# Patient Record
Sex: Female | Born: 1945 | Race: Black or African American | Hispanic: No | Marital: Married | Smoking: Never smoker
Health system: Southern US, Community
[De-identification: ages and names within clinical notes are randomized; demographics above are authoritative.]

## PROBLEM LIST (undated history)

## (undated) DIAGNOSIS — I10 Essential (primary) hypertension: Secondary | ICD-10-CM

## (undated) DIAGNOSIS — E119 Type 2 diabetes mellitus without complications: Secondary | ICD-10-CM

## (undated) DIAGNOSIS — M810 Age-related osteoporosis without current pathological fracture: Secondary | ICD-10-CM

## (undated) DIAGNOSIS — N951 Menopausal and female climacteric states: Secondary | ICD-10-CM

## (undated) DIAGNOSIS — F32A Depression, unspecified: Secondary | ICD-10-CM

## (undated) DIAGNOSIS — F329 Major depressive disorder, single episode, unspecified: Secondary | ICD-10-CM

## (undated) DIAGNOSIS — R05 Cough: Secondary | ICD-10-CM

## (undated) DIAGNOSIS — F0280 Dementia in other diseases classified elsewhere without behavioral disturbance: Secondary | ICD-10-CM

## (undated) DIAGNOSIS — E785 Hyperlipidemia, unspecified: Secondary | ICD-10-CM

## (undated) HISTORY — DX: Menopausal and female climacteric states: N95.1

## (undated) HISTORY — DX: Cough: R05

## (undated) HISTORY — PX: UPPER GASTROINTESTINAL ENDOSCOPY: SHX188

## (undated) HISTORY — PX: ABDOMINAL HYSTERECTOMY: SHX81

## (undated) HISTORY — DX: Age-related osteoporosis without current pathological fracture: M81.0

## (undated) HISTORY — DX: Hyperlipidemia, unspecified: E78.5

## (undated) HISTORY — PX: TONSILLECTOMY: SUR1361

## (undated) HISTORY — DX: Essential (primary) hypertension: I10

## (undated) HISTORY — DX: Type 2 diabetes mellitus without complications: E11.9

## (undated) HISTORY — DX: Dementia in other diseases classified elsewhere, unspecified severity, without behavioral disturbance, psychotic disturbance, mood disturbance, and anxiety: F02.80

## (undated) NOTE — *Deleted (*Deleted)
Therapist, nutritional Palliative Care Consult Note Telephone: [phone]  Fax: [phone]  PATIENT NAME: Brandi Walsh DOB: 1946-02-19 MRN: [id number]  PRIMARY CARE PROVIDER:   [name], FNP  REFERRING PROVIDER:  [name], FNP [address] [place],  [place] [address]  RESPONSIBLE PARTYArena, Lindahl [phone]    Difonzo,[name]   [phone]     RECOMMENDATIONS and PLAN:  Palliative care encounter  Z51.5  1.  Advance care planning:  Goals of care per family's report remain unchanged and include quality of care at home and continue adult day care program.  Transition to Hospice care for comfort when additional decline occurs.  Advanced directives are as follows: Code status is DNR.  Palliative care will continue to followup with patient at her day care center in aprox 4-6 weeks for re-evaluation of cognitive/functional abilities and L hip wounds. .     2.  Vascular dementia with behavioral disturbances:  At baseline.  FAST stage is 7C.  Aromatherapy. No use of Seroquel unless significant agitation noted per family request.  Monitor for cognitive, functional changes and continued weight loss.     3. L hip decubitus:  Continue use of Bactroban on wound and cover with nonstick bandage daily. Reposition every 2 hours. Attempt to increase dietary protein.  Monitor for changes of status of wounds.  I spent 35 minutes providing this consultation,  From 0900  to 0935. More than 50% of the time in this consultation was spent coordinating communication with patient, caregiver and grand daughter.   HISTORY OF PRESENT ILLNESS:  Follow-up with Brandi Walsh.  Caregiver states that patient eats well when at adult daycare and naps oftenfalls within the past several weeks. She remains dependent on caregivers and family for completion of all ADLs.  No reports of additional wounds other than  the L lateral hip. She completed an alternate antibiotic Palliative Care was asked to help address goals of care.   CODE STATUS: DNR  PPS: 40%  weak HOSPICE ELIGIBILITY/DIAGNOSIS: TBD  PAST MEDICAL HISTORY:  Past Medical History:  Diagnosis Date  . Alzheimer's disease (HCC)   . COUGH DUE TO ACE INHIBITORS 11/25/2009   Qualifier: Diagnosis of  By: Everardo All MD, Cleophas Dunker   . Depression   . DIABETES MELLITUS, TYPE II 10/13/2007   Qualifier: Diagnosis of  By: Charlsie Quest RMA, Lucy    . HYPERLIPIDEMIA 10/13/2007   Qualifier: Diagnosis of  By: Tyrone Apple, Lucy    . HYPERTENSION 10/13/2007   Qualifier: Diagnosis of  By: Tyrone Apple, Lucy    . MENOPAUSAL SYNDROME 10/13/2007   Qualifier: Diagnosis of  By: Charlsie Quest RMA, Lucy    . OSTEOPOROSIS 10/13/2007   Qualifier: Diagnosis of  By: Tyrone Apple, Lucy      SOCIAL HX: Lives with husband Social History    PERTINENT MEDICATIONS:  Outpatient Encounter Medications as of 08/07/2020  Medication Sig  . Cholecalciferol (VITAMIN D) 50 MCG (2000 UT) tablet Take 2,000 Units by mouth daily.  Marland Kitchen donepezil (ARICEPT) 10 MG tablet Take 1 tablet daily  . escitalopram (LEXAPRO) 10 MG tablet Take 1.5 tablets (15 mg total) by mouth daily.  Marland Kitchen losartan (COZAAR) 50 MG tablet Take 1 tablet (50 mg total) by mouth daily.  . mupirocin oiment (BACTROBAN) 2 % Apply topically 3 (three) times daily.  Marland Kitchen omeprazole (PRILOSEC) 20 MG capsule Take 1 capsule (20 mg total) by mouth daily.  . [DISCONTINUED]  cephALEXin (KEFLEX) 500 MG capsule Take 1 capsule (500 mg total) by mouth 3 (three) times daily.  . [DISCONTINUED] rosuvastatin (CRESTOR) 40 MG tablet Take 1 tablet (40 mg total) by mouth daily.       PHYSICAL EXAM:   General: NAD, very frail appearing, thin elderly female in recliner Cardiovascular: regular rate and rhythm Pulmonary: unlabored respirations Abdomen: soft, nontender, + bowel sounds Extremities: no edema, decreased muscle mass. Very stiff knees Skin: Recurrent 1x1cm  stage 2 decubitus at the L hip over prominent trochanter region and adjacent superficial abrasion.. Scant serous sanguinous discharge without  erythema or tenderness. No increased warmth.  Bactroban and nonstick bandage reapplied.   Neurological: Alert and unable to determine orientation due to cognitive deficit. Speaks in 3-4 word sentences. Follows instructions  Weakness  Margaretha Sheffield, NP-C

---

## 1998-10-01 ENCOUNTER — Other Ambulatory Visit: Admission: RE | Admit: 1998-10-01 | Discharge: 1998-10-01 | Payer: Self-pay | Admitting: Gynecology

## 1999-09-29 ENCOUNTER — Encounter: Admission: RE | Admit: 1999-09-29 | Discharge: 1999-09-29 | Payer: Self-pay | Admitting: Gynecology

## 1999-09-29 ENCOUNTER — Encounter: Payer: Self-pay | Admitting: Gynecology

## 1999-11-18 ENCOUNTER — Other Ambulatory Visit: Admission: RE | Admit: 1999-11-18 | Discharge: 1999-11-18 | Payer: Self-pay | Admitting: Gynecology

## 2000-01-13 ENCOUNTER — Encounter: Admission: RE | Admit: 2000-01-13 | Discharge: 2000-04-12 | Payer: Self-pay | Admitting: Endocrinology

## 2000-02-13 ENCOUNTER — Ambulatory Visit (HOSPITAL_COMMUNITY): Admission: RE | Admit: 2000-02-13 | Discharge: 2000-02-13 | Payer: Self-pay | Admitting: Endocrinology

## 2000-02-13 ENCOUNTER — Encounter: Payer: Self-pay | Admitting: Endocrinology

## 2000-09-29 ENCOUNTER — Encounter: Payer: Self-pay | Admitting: Gynecology

## 2000-09-29 ENCOUNTER — Encounter: Admission: RE | Admit: 2000-09-29 | Discharge: 2000-09-29 | Payer: Self-pay | Admitting: Gynecology

## 2000-11-18 ENCOUNTER — Other Ambulatory Visit: Admission: RE | Admit: 2000-11-18 | Discharge: 2000-11-18 | Payer: Self-pay | Admitting: Gynecology

## 2001-10-03 ENCOUNTER — Encounter: Payer: Self-pay | Admitting: Gynecology

## 2001-10-03 ENCOUNTER — Encounter: Admission: RE | Admit: 2001-10-03 | Discharge: 2001-10-03 | Payer: Self-pay | Admitting: Gynecology

## 2001-12-26 ENCOUNTER — Other Ambulatory Visit: Admission: RE | Admit: 2001-12-26 | Discharge: 2001-12-26 | Payer: Self-pay | Admitting: Gynecology

## 2002-08-01 ENCOUNTER — Encounter: Admission: RE | Admit: 2002-08-01 | Discharge: 2002-08-01 | Payer: Self-pay | Admitting: Gynecology

## 2002-08-01 ENCOUNTER — Encounter: Payer: Self-pay | Admitting: Gynecology

## 2002-08-30 HISTORY — PX: BREAST BIOPSY: SHX20

## 2002-09-18 ENCOUNTER — Encounter (INDEPENDENT_AMBULATORY_CARE_PROVIDER_SITE_OTHER): Payer: Self-pay | Admitting: *Deleted

## 2002-09-18 ENCOUNTER — Ambulatory Visit (HOSPITAL_BASED_OUTPATIENT_CLINIC_OR_DEPARTMENT_OTHER): Admission: RE | Admit: 2002-09-18 | Discharge: 2002-09-18 | Payer: Self-pay | Admitting: General Surgery

## 2003-04-06 ENCOUNTER — Other Ambulatory Visit: Admission: RE | Admit: 2003-04-06 | Discharge: 2003-04-06 | Payer: Self-pay | Admitting: Gynecology

## 2003-09-25 ENCOUNTER — Encounter: Admission: RE | Admit: 2003-09-25 | Discharge: 2003-09-25 | Payer: Self-pay | Admitting: Gynecology

## 2004-04-15 ENCOUNTER — Other Ambulatory Visit: Admission: RE | Admit: 2004-04-15 | Discharge: 2004-04-15 | Payer: Self-pay | Admitting: Gynecology

## 2004-10-15 ENCOUNTER — Ambulatory Visit: Payer: Self-pay | Admitting: Endocrinology

## 2004-10-20 ENCOUNTER — Ambulatory Visit: Payer: Self-pay | Admitting: Endocrinology

## 2005-03-25 ENCOUNTER — Encounter: Admission: RE | Admit: 2005-03-25 | Discharge: 2005-03-25 | Payer: Self-pay | Admitting: Gynecology

## 2005-04-08 ENCOUNTER — Ambulatory Visit: Payer: Self-pay | Admitting: Endocrinology

## 2005-04-15 ENCOUNTER — Ambulatory Visit: Payer: Self-pay | Admitting: Endocrinology

## 2005-05-19 ENCOUNTER — Other Ambulatory Visit: Admission: RE | Admit: 2005-05-19 | Discharge: 2005-05-19 | Payer: Self-pay | Admitting: Gynecology

## 2006-01-22 ENCOUNTER — Ambulatory Visit: Payer: Self-pay | Admitting: Endocrinology

## 2006-01-28 ENCOUNTER — Ambulatory Visit: Payer: Self-pay | Admitting: Endocrinology

## 2006-02-23 ENCOUNTER — Ambulatory Visit: Payer: Self-pay | Admitting: Internal Medicine

## 2006-03-10 ENCOUNTER — Ambulatory Visit: Payer: Self-pay | Admitting: Internal Medicine

## 2006-03-10 LAB — HM COLONOSCOPY

## 2006-05-11 ENCOUNTER — Encounter: Admission: RE | Admit: 2006-05-11 | Discharge: 2006-05-11 | Payer: Self-pay | Admitting: Gynecology

## 2006-05-24 ENCOUNTER — Encounter: Admission: RE | Admit: 2006-05-24 | Discharge: 2006-05-24 | Payer: Self-pay | Admitting: Gynecology

## 2006-08-03 ENCOUNTER — Other Ambulatory Visit: Admission: RE | Admit: 2006-08-03 | Discharge: 2006-08-03 | Payer: Self-pay | Admitting: Gynecology

## 2006-09-09 ENCOUNTER — Ambulatory Visit: Payer: Self-pay | Admitting: Endocrinology

## 2007-07-07 ENCOUNTER — Encounter: Admission: RE | Admit: 2007-07-07 | Discharge: 2007-07-07 | Payer: Self-pay | Admitting: Gynecology

## 2007-07-21 ENCOUNTER — Ambulatory Visit: Payer: Self-pay | Admitting: Endocrinology

## 2007-07-21 LAB — CONVERTED CEMR LAB
ALT: 15 units/L (ref 0–35)
AST: 22 units/L (ref 0–37)
Alkaline Phosphatase: 87 units/L (ref 39–117)
BUN: 11 mg/dL (ref 6–23)
Calcium: 9.7 mg/dL (ref 8.4–10.5)
Chloride: 104 meq/L (ref 96–112)
Cholesterol: 252 mg/dL (ref 0–200)
Creatinine, Ser: 0.7 mg/dL (ref 0.4–1.2)
Direct LDL: 207.4 mg/dL
Eosinophils Absolute: 0.2 10*3/uL (ref 0.0–0.6)
Eosinophils Relative: 3.9 % (ref 0.0–5.0)
Glucose, Bld: 100 mg/dL — ABNORMAL HIGH (ref 70–99)
HCT: 39.7 % (ref 36.0–46.0)
HDL: 26.3 mg/dL — ABNORMAL LOW (ref >39.0)
Hgb A1c MFr Bld: 5 % (ref 4.6–6.0)
Leukocytes, UA: NEGATIVE
MCHC: 34.8 g/dL (ref 30.0–36.0)
Monocytes Absolute: 0.4 10*3/uL (ref 0.2–0.7)
Neutro Abs: 3.7 10*3/uL (ref 1.4–7.7)
Neutrophils Relative %: 59.8 % (ref 43.0–77.0)
Platelets: 250 10*3/uL (ref 150–400)
RBC: 4.34 M/uL (ref 3.87–5.11)
RDW: 12.4 % (ref 11.5–14.6)
Sodium: 136 meq/L (ref 135–145)
Total Protein: 7.5 g/dL (ref 6.0–8.3)
Urobilinogen, UA: 0.2 (ref 0.0–1.0)

## 2007-07-27 ENCOUNTER — Ambulatory Visit: Payer: Self-pay | Admitting: Endocrinology

## 2007-10-13 ENCOUNTER — Encounter: Payer: Self-pay | Admitting: Endocrinology

## 2007-10-13 DIAGNOSIS — E119 Type 2 diabetes mellitus without complications: Secondary | ICD-10-CM | POA: Insufficient documentation

## 2007-10-13 DIAGNOSIS — M810 Age-related osteoporosis without current pathological fracture: Secondary | ICD-10-CM

## 2007-10-13 DIAGNOSIS — I10 Essential (primary) hypertension: Secondary | ICD-10-CM

## 2007-10-13 DIAGNOSIS — E785 Hyperlipidemia, unspecified: Secondary | ICD-10-CM

## 2007-10-13 DIAGNOSIS — N951 Menopausal and female climacteric states: Secondary | ICD-10-CM | POA: Insufficient documentation

## 2007-10-13 HISTORY — DX: Age-related osteoporosis without current pathological fracture: M81.0

## 2007-10-13 HISTORY — DX: Essential (primary) hypertension: I10

## 2007-10-13 HISTORY — DX: Menopausal and female climacteric states: N95.1

## 2007-10-13 HISTORY — DX: Hyperlipidemia, unspecified: E78.5

## 2007-10-13 HISTORY — DX: Type 2 diabetes mellitus without complications: E11.9

## 2007-11-07 ENCOUNTER — Other Ambulatory Visit: Admission: RE | Admit: 2007-11-07 | Discharge: 2007-11-07 | Payer: Self-pay | Admitting: Gynecology

## 2008-07-10 ENCOUNTER — Encounter: Admission: RE | Admit: 2008-07-10 | Discharge: 2008-07-10 | Payer: Self-pay | Admitting: Gynecology

## 2008-07-24 ENCOUNTER — Ambulatory Visit: Payer: Self-pay | Admitting: Endocrinology

## 2008-07-25 LAB — CONVERTED CEMR LAB
ALT: 27 units/L (ref 0–35)
AST: 25 units/L (ref 0–37)
Albumin: 3.9 g/dL (ref 3.5–5.2)
Alkaline Phosphatase: 66 units/L (ref 39–117)
Basophils Relative: 0.4 % (ref 0.0–3.0)
Bilirubin Urine: NEGATIVE
CO2: 29 meq/L (ref 19–32)
Calcium: 9.9 mg/dL (ref 8.4–10.5)
Chloride: 113 meq/L — ABNORMAL HIGH (ref 96–112)
Creatinine, Ser: 0.7 mg/dL (ref 0.4–1.2)
Eosinophils Absolute: 0.3 10*3/uL (ref 0.0–0.7)
Eosinophils Relative: 3.3 % (ref 0.0–5.0)
Glucose, Bld: 102 mg/dL — ABNORMAL HIGH (ref 70–99)
HCT: 42.3 % (ref 36.0–46.0)
HDL: 28.7 mg/dL — ABNORMAL LOW (ref >39.0)
Ketones, ur: NEGATIVE mg/dL
Lymphocytes Relative: 28.4 % (ref 12.0–46.0)
Monocytes Absolute: 0.7 10*3/uL (ref 0.1–1.0)
Neutro Abs: 4.6 10*3/uL (ref 1.4–7.7)
TSH: 1.06 microintl units/mL (ref 0.35–5.50)
Total Bilirubin: 1.3 mg/dL — ABNORMAL HIGH (ref 0.3–1.2)
Total Protein, Urine: NEGATIVE mg/dL
Triglycerides: 123 mg/dL (ref 0–149)
Urobilinogen, UA: 0.2 (ref 0.0–1.0)
VLDL: 25 mg/dL (ref 0–40)
pH: 6 (ref 5.0–8.0)

## 2008-07-31 ENCOUNTER — Ambulatory Visit: Payer: Self-pay | Admitting: Endocrinology

## 2008-09-13 ENCOUNTER — Encounter: Payer: Self-pay | Admitting: Endocrinology

## 2008-09-13 ENCOUNTER — Ambulatory Visit: Payer: Self-pay | Admitting: Internal Medicine

## 2009-07-22 ENCOUNTER — Encounter: Admission: RE | Admit: 2009-07-22 | Discharge: 2009-07-22 | Payer: Self-pay | Admitting: Gynecology

## 2009-11-08 ENCOUNTER — Ambulatory Visit: Payer: Self-pay | Admitting: Endocrinology

## 2009-11-08 LAB — CONVERTED CEMR LAB
ALT: 26 units/L (ref 0–35)
AST: 23 units/L (ref 0–37)
Basophils Relative: 0.5 % (ref 0.0–3.0)
Bilirubin, Direct: 0.2 mg/dL (ref 0.0–0.3)
Cholesterol: 106 mg/dL (ref 0–200)
Creatinine,U: 350.3 mg/dL
Eosinophils Absolute: 0.3 10*3/uL (ref 0.0–0.7)
Glucose, Bld: 117 mg/dL — ABNORMAL HIGH (ref 70–99)
HCT: 43.4 % (ref 36.0–46.0)
HDL: 31.5 mg/dL — ABNORMAL LOW (ref >39.00)
Hemoglobin: 15 g/dL (ref 12.0–15.0)
Lymphocytes Relative: 27.7 % (ref 12.0–46.0)
Lymphs Abs: 2.1 10*3/uL (ref 0.7–4.0)
Microalb, Ur: 3.6 mg/dL — ABNORMAL HIGH (ref 0.0–1.9)
Monocytes Relative: 6.9 % (ref 3.0–12.0)
Potassium: 3.9 meq/L (ref 3.5–5.1)
RBC: 4.64 M/uL (ref 3.87–5.11)
RDW: 11.9 % (ref 11.5–14.6)
Sodium: 140 meq/L (ref 135–145)
VLDL: 18.8 mg/dL (ref 0.0–40.0)

## 2009-11-25 ENCOUNTER — Ambulatory Visit: Payer: Self-pay | Admitting: Endocrinology

## 2009-11-25 DIAGNOSIS — R059 Cough, unspecified: Secondary | ICD-10-CM | POA: Insufficient documentation

## 2009-11-25 DIAGNOSIS — R05 Cough: Secondary | ICD-10-CM

## 2009-11-25 HISTORY — DX: Cough, unspecified: R05.9

## 2010-05-02 ENCOUNTER — Telehealth: Payer: Self-pay | Admitting: Internal Medicine

## 2010-06-27 ENCOUNTER — Telehealth: Payer: Self-pay | Admitting: Endocrinology

## 2010-07-24 ENCOUNTER — Encounter: Admission: RE | Admit: 2010-07-24 | Discharge: 2010-07-24 | Payer: Self-pay | Admitting: Gynecology

## 2010-12-03 ENCOUNTER — Other Ambulatory Visit: Payer: Self-pay | Admitting: Endocrinology

## 2010-12-03 ENCOUNTER — Encounter: Payer: Self-pay | Admitting: Endocrinology

## 2010-12-03 ENCOUNTER — Ambulatory Visit
Admission: RE | Admit: 2010-12-03 | Discharge: 2010-12-03 | Payer: Self-pay | Source: Home / Self Care | Attending: Endocrinology | Admitting: Endocrinology

## 2010-12-03 DIAGNOSIS — D751 Secondary polycythemia: Secondary | ICD-10-CM

## 2010-12-03 HISTORY — DX: Secondary polycythemia: D75.1

## 2010-12-03 LAB — CBC WITH DIFFERENTIAL/PLATELET
Basophils Absolute: 0 10*3/uL (ref 0.0–0.1)
Basophils Relative: 0.4 % (ref 0.0–3.0)
Eosinophils Absolute: 0.2 10*3/uL (ref 0.0–0.7)
Eosinophils Relative: 2 % (ref 0.0–5.0)
HCT: 46.7 % — ABNORMAL HIGH (ref 36.0–46.0)
Hemoglobin: 16.4 g/dL — ABNORMAL HIGH (ref 12.0–15.0)
Lymphocytes Relative: 17.6 % (ref 12.0–46.0)
Lymphs Abs: 2 10*3/uL (ref 0.7–4.0)
MCHC: 35.1 g/dL (ref 30.0–36.0)
MCV: 90.8 fl (ref 78.0–100.0)
Monocytes Absolute: 0.7 10*3/uL (ref 0.1–1.0)
Monocytes Relative: 5.7 % (ref 3.0–12.0)
Neutro Abs: 8.5 10*3/uL — ABNORMAL HIGH (ref 1.4–7.7)
Neutrophils Relative %: 74.3 % (ref 43.0–77.0)
Platelets: 247 10*3/uL (ref 150.0–400.0)
RBC: 5.14 Mil/uL — ABNORMAL HIGH (ref 3.87–5.11)
RDW: 12.5 % (ref 11.5–14.6)
WBC: 11.5 10*3/uL — ABNORMAL HIGH (ref 4.5–10.5)

## 2010-12-03 LAB — HEPATIC FUNCTION PANEL
ALT: 28 U/L (ref 0–35)
AST: 26 U/L (ref 0–37)
Albumin: 4.4 g/dL (ref 3.5–5.2)
Alkaline Phosphatase: 79 U/L (ref 39–117)
Bilirubin, Direct: 0.2 mg/dL (ref 0.0–0.3)
Total Bilirubin: 1.3 mg/dL — ABNORMAL HIGH (ref 0.3–1.2)
Total Protein: 8.1 g/dL (ref 6.0–8.3)

## 2010-12-03 LAB — URINALYSIS, ROUTINE W REFLEX MICROSCOPIC
Bilirubin Urine: NEGATIVE
Ketones, ur: NEGATIVE
Leukocytes, UA: NEGATIVE
Nitrite: NEGATIVE
Specific Gravity, Urine: 1.015 (ref 1.000–1.030)
Total Protein, Urine: NEGATIVE
Urine Glucose: NEGATIVE
Urobilinogen, UA: 0.2 (ref 0.0–1.0)
pH: 6 (ref 5.0–8.0)

## 2010-12-03 LAB — BASIC METABOLIC PANEL
BUN: 15 mg/dL (ref 6–23)
CO2: 27 mEq/L (ref 19–32)
Calcium: 10.9 mg/dL — ABNORMAL HIGH (ref 8.4–10.5)
Chloride: 107 mEq/L (ref 96–112)
Creatinine, Ser: 0.6 mg/dL (ref 0.4–1.2)
GFR: 99 mL/min (ref >60.00)
Glucose, Bld: 112 mg/dL — ABNORMAL HIGH (ref 70–99)
Potassium: 4 mEq/L (ref 3.5–5.1)
Sodium: 140 mEq/L (ref 135–145)

## 2010-12-03 LAB — HEMOGLOBIN A1C: Hgb A1c MFr Bld: 5.7 % (ref 4.6–6.5)

## 2010-12-03 LAB — LIPID PANEL
Cholesterol: 122 mg/dL (ref 0–200)
HDL: 33.9 mg/dL — ABNORMAL LOW (ref >39.00)
LDL Cholesterol: 70 mg/dL (ref 0–99)
Total CHOL/HDL Ratio: 4
Triglycerides: 90 mg/dL (ref 0.0–149.0)
VLDL: 18 mg/dL (ref 0.0–40.0)

## 2010-12-03 LAB — MICROALBUMIN / CREATININE URINE RATIO
Creatinine,U: 272.9 mg/dL
Microalb Creat Ratio: 1.1 mg/g (ref 0.0–30.0)
Microalb, Ur: 3 mg/dL — ABNORMAL HIGH (ref 0.0–1.9)

## 2010-12-03 LAB — TSH: TSH: 0.93 u[IU]/mL (ref 0.35–5.50)

## 2010-12-04 LAB — CONVERTED CEMR LAB: PTH: 66.2 pg/mL (ref 14.0–72.0)

## 2010-12-05 ENCOUNTER — Encounter: Payer: Self-pay | Admitting: Endocrinology

## 2010-12-05 ENCOUNTER — Ambulatory Visit
Admission: RE | Admit: 2010-12-05 | Discharge: 2010-12-05 | Payer: Self-pay | Source: Home / Self Care | Attending: Endocrinology | Admitting: Endocrinology

## 2010-12-05 LAB — CONVERTED CEMR LAB: Calcium, Total (PTH): 10.3 mg/dL (ref 8.4–10.5)

## 2010-12-11 ENCOUNTER — Ambulatory Visit: Payer: Self-pay | Admitting: Internal Medicine

## 2010-12-16 ENCOUNTER — Encounter: Payer: Self-pay | Admitting: Endocrinology

## 2010-12-16 LAB — COMPREHENSIVE METABOLIC PANEL
ALT: 25 U/L (ref 0–35)
AST: 21 U/L (ref 0–37)
Albumin: 4.5 g/dL (ref 3.5–5.2)
Alkaline Phosphatase: 85 U/L (ref 39–117)
BUN: 12 mg/dL (ref 6–23)
CO2: 23 mEq/L (ref 19–32)
Calcium: 10.7 mg/dL — ABNORMAL HIGH (ref 8.4–10.5)
Chloride: 105 mEq/L (ref 96–112)
Creatinine, Ser: 0.83 mg/dL (ref 0.40–1.20)
Glucose, Bld: 113 mg/dL — ABNORMAL HIGH (ref 70–99)
Potassium: 4.1 mEq/L (ref 3.5–5.3)
Sodium: 138 mEq/L (ref 135–145)
Total Bilirubin: 0.9 mg/dL (ref 0.3–1.2)
Total Protein: 7.4 g/dL (ref 6.0–8.3)

## 2010-12-16 LAB — CBC WITH DIFFERENTIAL/PLATELET
BASO%: 0.5 % (ref 0.0–2.0)
Basophils Absolute: 0.1 10*3/uL (ref 0.0–0.1)
EOS%: 3.6 % (ref 0.0–7.0)
Eosinophils Absolute: 0.3 10*3/uL (ref 0.0–0.5)
HCT: 43.8 % (ref 34.8–46.6)
HGB: 15.3 g/dL (ref 11.6–15.9)
LYMPH%: 21.3 % (ref 14.0–49.7)
MCH: 31 pg (ref 25.1–34.0)
MCHC: 34.8 g/dL (ref 31.5–36.0)
MCV: 88.9 fL (ref 79.5–101.0)
MONO#: 0.8 10*3/uL (ref 0.1–0.9)
MONO%: 7.8 % (ref 0.0–14.0)
NEUT#: 6.5 10*3/uL (ref 1.5–6.5)
NEUT%: 66.8 % (ref 38.4–76.8)
Platelets: 224 10*3/uL (ref 145–400)
RBC: 4.93 10*6/uL (ref 3.70–5.45)
RDW: 12.5 % (ref 11.2–14.5)
WBC: 9.7 10*3/uL (ref 3.9–10.3)
lymph#: 2.1 10*3/uL (ref 0.9–3.3)

## 2010-12-21 ENCOUNTER — Encounter: Payer: Self-pay | Admitting: Gynecology

## 2010-12-30 NOTE — Progress Notes (Signed)
  Phone Note Refill Request Message from:  Fax from Pharmacy  Refills Requested: Medication #1:  LOSARTAN POTASSIUM 100 MG TABS 1 qd   Dosage confirmed as above?Dosage Confirmed   Notes: Requesting 90 day supply  Method Requested: Fax to Local Pharmacy Initial call taken by: Brenton Grills MA,  June 27, 2010 1:50 PM    Prescriptions: LOSARTAN POTASSIUM 100 MG TABS (LOSARTAN POTASSIUM) 1 qd  #90 x 0   Entered by:   Brenton Grills MA   Authorized by:   Minus Breeding MD   Signed by:   Brenton Grills MA on 06/27/2010   Method used:   F[facility]ed to ...       [facility]  [address] [phone][facility] (retail)       [address]       [place], [place]  [address]       Ph: [phone] or [phone]       Fax: [phone]   RxID:   [id number]

## 2010-12-30 NOTE — Progress Notes (Signed)
  Phone Note Call from Patient   Caller: Patient Reason for Call: Talk to Doctor Summary of Call: Had CPX 11/25/09 md change benzapril to benicar 40mg . He gave samples. pt states she is finish with samples and BP has been running good. Req rx [facility] benicar [facility]. Per MD last note he would send in generic losartan. Pls advise Initial call taken by: Orlan Leavens,  May 02, 2010 2:09 PM  Follow-up for Phone Call        ok for generic losartan 100 mg per day - to robin to handle Follow-up by: Corwin Levins MD,  May 02, 2010 2:21 PM    New/Updated Medications: LOSARTAN POTASSIUM 100 MG TABS (LOSARTAN POTASSIUM) 1 by mouth once daily Prescriptions: LOSARTAN POTASSIUM 100 MG TABS (LOSARTAN POTASSIUM) 1 by mouth once daily  #30 x 7   Entered by:   Scharlene Gloss   Authorized by:   Corwin Levins MD   Signed by:   Scharlene Gloss on 05/02/2010   Method used:   Faxed to ...       [facility]  [address] [phone]* (retail)       [address]       [place], [place]  [id number]       Ph: [phone] or [phone]       Fax: [phone]   RxID:   [id number]

## 2010-12-30 NOTE — Progress Notes (Signed)
Summary: [facility]  Phone Note Refill Request Message from:  Pharmacy  Refills Requested: Medication #1:  CRESTOR 40 MG  TABS take 1 by mouth qd   Dosage confirmed as above?Dosage Confirmed   Last Refilled: 04/01/2010   Notes: Requesting 90 day supply  Method Requested: Fax to Local Pharmacy Initial call taken by: Brenton Grills MA,  June 27, 2010 11:24 AM    Prescriptions: CRESTOR 40 MG  TABS (ROSUVASTATIN CALCIUM) take 1 by mouth qd  #90 Tablet x 0   Entered by:   Brenton Grills MA   Authorized by:   Minus Breeding MD   Signed by:   Brenton Grills MA on 06/27/2010   Method used:   F[facility]ed to ...       [facility]  [address] [phone][facility] (retail)       [address]       [place], [place]  [address]       Ph: [phone] or [phone]       Fax: [phone]   RxID:   [id number]

## 2011-01-01 NOTE — Assessment & Plan Note (Signed)
Summary: Yearly f/u #/cd   Vital Signs:  Patient profile:   65 year old female Height:      63 inches (160.02 cm) Weight:      161.50 pounds (73.41 kg) BMI:     28.71 O2 Sat:      97 % on Room air Temp:     98.9 degrees F (37.17 degrees C) oral Pulse rate:   110 / minute BP sitting:   128 / 78  (left arm) Cuff size:   regular  Vitals Entered By: Brenton Grills CMA Duncan Dull) (December 03, 2010 10:27 AM)  O2 Flow:  Room air CC: Yearly F/U/aj Is Patient Diabetic? Yes   CC:  Yearly F/U/aj.  History of Present Illness: pt is here for medicare welllness visit.  she denies memory loss and depression.  she says she is able to perform activities of daily living without assistance.  she has no limitations to physical activity.  Current Medications (verified): 1)  Crestor 40 Mg  Tabs (Rosuvastatin Calcium) .... Take 1 By Mouth Qd 2)  Estrogen Patch .0275 3)  Freestyle Lite Test  Strp (Glucose Blood) .... Use Once Daily, and Lancets 250.00 4)  Losartan Potassium 100 Mg Tabs (Losartan Potassium) .Marland Kitchen.. 1 By Mouth Once Daily  Allergies (verified): 1)  ! Mevacor  Past History:  Past Medical History: Diabetes mellitus, type II Hyperlipidemia Hypertension Osteoporosis  gyn is dr lomax opthal: [place]  Family History: Reviewed history from 07/31/2008 and no changes required. no cancer  Social History: Reviewed history from 07/31/2008 and no changes required. retired married never a smoker etoh is 2 bottles wine per yearl no illegal drugs  Review of Systems  The patient denies vision loss and decreased hearing.    Physical Exam  Eyes:  (sees opthal) Ears:  rossly normal hearing.   Psych:  remembers 3/3 at 5 minutes.  excellent recall.  can easily read and write a sentence.  alert and oriented x 3 Additional Exam:  SEPARATE EVALUATION FOLLOWS--EACH PROBLEM HERE IS NEW, NOT RESPONDING TO TREATMENT, OR POSES SIGNIFICANT RISK TO THE PATIENT'S HEALTH: HISTORY OF THE PRESENT  ILLNESS: last year, pt had borderline-high h/h in the past, pt has had normal ca++ level. PAST MEDICAL HISTORY reviewed and up to date today REVIEW OF SYSTEMS: denies visual loss PHYSICAL EXAMINATION: ext: no edema see vs page LAB/XRAY RESULTS: Hemoglobin           [H]  16.4 g/dL                   04.5-40.9 Hematocrit           [H]  46.7 %    Parathyroid Hormone       66.2 pg/mL                  14.0-72.0 Calcium              [H]  11.3 mg/dL       IMPRESSION: polycythemia, uncertain etiology hypercalcemia, uncertain etiology  PLAN: see instruction sheet   Other Orders: T-Parathyroid Hormone, Intact w/ Calcium (81191-47829) T-Bone Densitometry (56213) TLB-Lipid Panel (80061-LIPID) TLB-BMP (Basic Metabolic Panel-BMET) (80048-METABOL) TLB-Hepatic/Liver Function Pnl (80076-HEPATIC) TLB-TSH (Thyroid Stimulating Hormone) (84443-TSH) TLB-CBC Platelet - w/Differential (85025-CBCD) TLB-A1C / Hgb A1C (Glycohemoglobin) (83036-A1C) TLB-Microalbumin/Creat Ratio, Urine (82043-MALB) TLB-Udip w/ Micro (81001-URINE) Hematology Referral (Hematology) Est. Patient Level III (08657) Welcome to Medicare, Physical (Q4696)  Preventive Care Screening     gyn is dr lomax   Patient  Instructions: 1)  blood tests are being ordered for you today.  please call [phone] to hear your test results. 2)  pending the test results, please continue the same medications for now. 3)  please consider these measures for your health:  minimize alcohol.  do not use tobacco products.  have a colonoscopy at least every 10 years from age 56.  keep firearms safely stored.  always use seat belts.  have working smoke alarms in your home.  see an eye doctor and dentist regularly.  never drive under the influence of alcohol or drugs (including prescription drugs).   4)  please let me know what your wishes would be, if artificial life support measures should become necessary.  it is critically important to prevent falling  down (keep floor areas well-lit, dry, and free of loose objects). 5)  (update: we discussed code status.  pt requests full code, but would not want to be started or maintained on artificial life-support measures if there was not a reasonable chance of recovery) 6)  (update: i left message on phone-tree:  refer hematol.  we'll add pth onto labs). Prescriptions: LOSARTAN POTASSIUM 100 MG TABS (LOSARTAN POTASSIUM) 1 by mouth once daily  #90 x 3   Entered and Authorized by:   Minus Breeding MD   Signed by:   Minus Breeding MD on 12/03/2010   Method used:   [facility]        [facility]  [address] [phone]* (retail)       [address]       [place], [place]  [address]       Ph: [phone] or [phone]       Fax: [phone]   RxID:   [id number] CRESTOR 40 MG  TABS (ROSUVASTATIN CALCIUM) take 1 by mouth qd  #90 x 3   Entered and Authorized by:   Minus Breeding MD   Signed by:   Minus Breeding MD on 12/03/2010   Method used:   [facility]ronically to        [facility]  [address] [phone]* (retail)       [address]       [place], [place]  [address]       Ph: [phone] or [phone]       Fax: [phone]   RxID:   [id number]    Orders Added: 1)  T-Parathyroid Hormone, Intact w/ Calcium [61443-15400] 2)  T-Bone Densitometry [77080] 3)  TLB-Lipid Panel [80061-LIPID] 4)  TLB-BMP (Basic Metabolic Panel-BMET) [80048-METABOL] 5)  TLB-Hepatic/Liver Function Pnl [80076-HEPATIC] 6)  TLB-TSH (Thyroid Stimulating Hormone) [84443-TSH] 7)  TLB-CBC Platelet - w/Differential [85025-CBCD] 8)  TLB-A1C / Hgb A1C (Glycohemoglobin) [83036-A1C] 9)  TLB-Microalbumin/Creat Ratio, Urine [82043-MALB] 10)  TLB-Udip w/ Micro [81001-URINE] 11)  Hematology Referral [Hematology] 12)  Est. Patient Level III [86761] 13)  Welcome to Medicare, Physical [G0402]     Preventive Care Screening     gyn is dr Nicholas Lose

## 2011-01-06 ENCOUNTER — Other Ambulatory Visit: Payer: Self-pay

## 2011-01-07 NOTE — Letter (Signed)
Summary: [facility]  [facility]   Imported By: Sherian Rein 01/02/2011 07:50:34  _____________________________________________________________________  External Attachment:    Type:   Image     Comment:   External Document

## 2011-01-15 ENCOUNTER — Telehealth: Payer: Self-pay | Admitting: Endocrinology

## 2011-01-15 ENCOUNTER — Other Ambulatory Visit: Payer: Self-pay

## 2011-01-15 ENCOUNTER — Ambulatory Visit (INDEPENDENT_AMBULATORY_CARE_PROVIDER_SITE_OTHER)
Admission: RE | Admit: 2011-01-15 | Discharge: 2011-01-15 | Disposition: A | Discharge status: Home / Self Care | Payer: Medicare Other | Source: Ambulatory Visit | Attending: Endocrinology | Admitting: Endocrinology

## 2011-01-15 ENCOUNTER — Encounter: Payer: Self-pay | Admitting: Endocrinology

## 2011-01-15 ENCOUNTER — Other Ambulatory Visit: Payer: Self-pay | Admitting: Endocrinology

## 2011-01-15 DIAGNOSIS — M810 Age-related osteoporosis without current pathological fracture: Secondary | ICD-10-CM

## 2011-01-21 NOTE — Progress Notes (Signed)
Summary: Order-LVA  Phone Note From Other Clinic   Caller: Radiology Summary of Call: Radiology called needing order for Lumbar Vertebral Assessment for Bone Density today Initial call taken by: Brenton Grills CMA Duncan Dull),  January 15, 2011 8:52 AM

## 2011-03-04 ENCOUNTER — Other Ambulatory Visit (INDEPENDENT_AMBULATORY_CARE_PROVIDER_SITE_OTHER): Payer: Medicare Other | Admitting: Endocrinology

## 2011-03-04 DIAGNOSIS — E119 Type 2 diabetes mellitus without complications: Secondary | ICD-10-CM

## 2011-04-17 NOTE — Op Note (Signed)
   NAME:  Brandi Walsh, Brandi Walsh                         ACCOUNT NO.:  [id number]   MEDICAL RECORD NO.:  [id number]                   PATIENT TYPE:  AMB   LOCATION:  DSC                                  FACILITY:  [facility]   PHYSICIAN:  Rose Phi. Maple Hudson, M.D.                DATE OF BIRTH:  May 15, 1946   DATE OF PROCEDURE:  09/18/2002  DATE OF DISCHARGE:                                 OPERATIVE REPORT   PREOPERATIVE DIAGNOSIS:  Intraductal papilloma of the left breast.   POSTOPERATIVE DIAGNOSIS:  Intraductal papilloma of the left breast.   PROCEDURE:  Excision of duct system, left breast.   SURGEON:  Rose Phi. Maple Hudson, M.D.   ANESTHESIA:  MAC.   DESCRIPTION OF PROCEDURE:  This patient had presented with a bloody nipple  discharge from the 5 o'clock position of the left breast which, on  ductogram, had demonstrated a filling defect just in the subareolar tissue.  She was scheduled for excision.   The patient placed on the operating table and the left breast prepped and  draped in the usual fashion.  Again we could express bloody nipple discharge  from the 5 o'clock position.  A circumareolar incision was outlined centered  on the 5 o'clock position.  The area was infiltrated with 1% Xylocaine with  adrenalin.  Incision was made, and we lifted up an areolar flap to  underneath the nipple and then excised the duct system, and one could see  the bloody discharge in the duct, and excised the papilloma.  Hemostasis  obtained with the cautery.  Subcuticular closure of 4-0 Monocryl and Steri-  Strips carried out.  Dressing applied.  Patient transferred to the recovery  room in satisfactory condition, having tolerated the procedure well.                                               Rose Phi. Maple Hudson, M.D.    PRY/MEDQ  D:  09/18/2002  T:  09/18/2002  Job:  [id number]   cc:   Gretta Cool, M.D.  [address] [place]  [place] [address]  Fax: [phone]

## 2011-07-17 ENCOUNTER — Other Ambulatory Visit: Payer: Self-pay | Admitting: Gynecology

## 2011-07-17 DIAGNOSIS — Z1231 Encounter for screening mammogram for malignant neoplasm of breast: Secondary | ICD-10-CM

## 2011-07-29 ENCOUNTER — Ambulatory Visit
Admission: RE | Admit: 2011-07-29 | Discharge: 2011-07-29 | Disposition: A | Discharge status: Home / Self Care | Payer: Medicare Other | Source: Ambulatory Visit | Attending: Gynecology | Admitting: Gynecology

## 2011-07-29 DIAGNOSIS — Z1231 Encounter for screening mammogram for malignant neoplasm of breast: Secondary | ICD-10-CM

## 2012-02-11 ENCOUNTER — Other Ambulatory Visit: Payer: Self-pay | Admitting: Endocrinology

## 2012-02-19 ENCOUNTER — Other Ambulatory Visit (INDEPENDENT_AMBULATORY_CARE_PROVIDER_SITE_OTHER): Payer: Medicare Other

## 2012-02-19 ENCOUNTER — Encounter: Payer: Self-pay | Admitting: Endocrinology

## 2012-02-19 ENCOUNTER — Ambulatory Visit (INDEPENDENT_AMBULATORY_CARE_PROVIDER_SITE_OTHER): Payer: Medicare Other | Admitting: Endocrinology

## 2012-02-19 VITALS — BP 142/98 | HR 92 | Temp 98.2°F | Wt 161.0 lb

## 2012-02-19 DIAGNOSIS — K219 Gastro-esophageal reflux disease without esophagitis: Secondary | ICD-10-CM

## 2012-02-19 DIAGNOSIS — Z79899 Other long term (current) drug therapy: Secondary | ICD-10-CM

## 2012-02-19 DIAGNOSIS — E785 Hyperlipidemia, unspecified: Secondary | ICD-10-CM

## 2012-02-19 DIAGNOSIS — M810 Age-related osteoporosis without current pathological fracture: Secondary | ICD-10-CM

## 2012-02-19 DIAGNOSIS — E119 Type 2 diabetes mellitus without complications: Secondary | ICD-10-CM

## 2012-02-19 DIAGNOSIS — I10 Essential (primary) hypertension: Secondary | ICD-10-CM

## 2012-02-19 DIAGNOSIS — Z0000 Encounter for general adult medical examination without abnormal findings: Secondary | ICD-10-CM

## 2012-02-19 DIAGNOSIS — D751 Secondary polycythemia: Secondary | ICD-10-CM

## 2012-02-19 HISTORY — DX: Gastro-esophageal reflux disease without esophagitis: K21.9

## 2012-02-19 LAB — CBC WITH DIFFERENTIAL/PLATELET
Basophils Absolute: 0 10*3/uL (ref 0.0–0.1)
Eosinophils Relative: 1.1 % (ref 0.0–5.0)
HCT: 45.3 % (ref 36.0–46.0)
Lymphs Abs: 1.9 10*3/uL (ref 0.7–4.0)
MCV: 90.7 fl (ref 78.0–100.0)
Monocytes Absolute: 0.7 10*3/uL (ref 0.1–1.0)
Platelets: 243 10*3/uL (ref 150.0–400.0)
RDW: 12.7 % (ref 11.5–14.6)

## 2012-02-19 LAB — HEMOGLOBIN A1C: Hgb A1c MFr Bld: 6 % (ref 4.6–6.5)

## 2012-02-19 LAB — LIPID PANEL
HDL: 37 mg/dL — ABNORMAL LOW (ref >39.00)
Total CHOL/HDL Ratio: 3
VLDL: 28.2 mg/dL (ref 0.0–40.0)

## 2012-02-19 LAB — HEPATIC FUNCTION PANEL
ALT: 24 U/L (ref 0–35)
Albumin: 4.5 g/dL (ref 3.5–5.2)
Alkaline Phosphatase: 94 U/L (ref 39–117)
Total Protein: 8 g/dL (ref 6.0–8.3)

## 2012-02-19 LAB — BASIC METABOLIC PANEL
CO2: 27 mEq/L (ref 19–32)
Calcium: 10.5 mg/dL (ref 8.4–10.5)
Creatinine, Ser: 0.7 mg/dL (ref 0.4–1.2)
Glucose, Bld: 110 mg/dL — ABNORMAL HIGH (ref 70–99)

## 2012-02-19 LAB — URINALYSIS, ROUTINE W REFLEX MICROSCOPIC
Bilirubin Urine: NEGATIVE
Hgb urine dipstick: NEGATIVE
Leukocytes, UA: NEGATIVE
Nitrite: NEGATIVE

## 2012-02-19 NOTE — Progress Notes (Signed)
Subjective:    Patient ID: Brandi Walsh, female    DOB: 02-27-1946, 66 y.o.   MRN: [id number]  HPI Pt states few mos of intermittent mild heartburn in the chest, but no assoc dysphagia.  Past Medical History  Diagnosis Date  . DIABETES MELLITUS, TYPE II 10/13/2007    Qualifier: Diagnosis of  By: Charlsie Quest RMA, Lucy    . COUGH DUE TO ACE INHIBITORS 11/25/2009    Qualifier: Diagnosis of  By: Everardo All MD, Cleophas Dunker   . HYPERLIPIDEMIA 10/13/2007    Qualifier: Diagnosis of  By: Tyrone Apple, Lucy    . HYPERTENSION 10/13/2007    Qualifier: Diagnosis of  By: Tyrone Apple, Lucy    . MENOPAUSAL SYNDROME 10/13/2007    Qualifier: Diagnosis of  By: Charlsie Quest RMA, Lucy    . OSTEOPOROSIS 10/13/2007    Qualifier: Diagnosis of  By: Samara Snide      Past Surgical History  Procedure Date  . Abdominal hysterectomy   . Breast biopsy 08/2002    History   Social History  . Marital Status: Married    Spouse Name: N/A    Number of Children: N/A  . Years of Education: N/A   Occupational History  . Retired     Social History Main Topics  . Smoking status: Never Smoker   . Smokeless tobacco: Not on file  . Alcohol Use: Yes  . Drug Use: No  . Sexually Active: Not on file   Other Topics Concern  . Not on file   Social History Narrative  . No narrative on file    Current Outpatient Prescriptions on File Prior to Visit  Medication Sig Dispense Refill  . FREESTYLE LITE test strip TEST ONCE DAILY  100 each  2  . losartan (COZAAR) 100 MG tablet TAKE 1 TABLET EVERY DAY  90 tablet  0    Allergies  Allergen Reactions  . Lovastatin     REACTION: Rash    Family History  Problem Relation Age of Onset  . Cancer Neg Hx     BP 142/98  Pulse 92  Temp(Src) 98.2 F (36.8 C) (Oral)  Wt 161 lb (73.029 kg)  SpO2 98%  Review of Systems Denies chest pain, brbpr, weight loss, and sob    Objective:   Physical Exam VS: see vs page GEN: no distress HEAD: head: no deformity eyes: no periorbital  swelling, no proptosis external nose and ears are normal mouth: no lesion seen NECK: supple, thyroid is not enlarged CHEST WALL: no deformity LUNGS:  Clear to auscultation BREASTS:  sees gyn CV: reg rate and rhythm, no murmur ABD: abdomen is soft, nontender.  no hepatosplenomegaly.  not distended.  no hernia GENITALIA/RECTAL: sees gyn MUSCULOSKELETAL: muscle bulk and strength are grossly normal.  no obvious joint swelling.  gait is normal and steady EXTEMITIES: no deformity.  no ulcer on the feet.  feet are of normal color and temp.  no edema PULSES: dorsalis pedis intact bilat.  no carotid bruit NEURO:  cn 2-12 grossly intact.   readily moves all 4's.  sensation is intact to touch on the feet SKIN:  Normal texture and temperature.  No rash or suspicious lesion is visible.   NODES:  None palpable at the neck PSYCH: alert, oriented x3.  Does not appear anxious nor depressed.  Lab Results  Component Value Date   WBC 10.4 02/19/2012   HGB 15.4* 02/19/2012   HCT 45.3 02/19/2012   PLT 243.0 02/19/2012  GLUCOSE 110* 02/19/2012   CHOL 115 02/19/2012   TRIG 141.0 02/19/2012   HDL 37.00* 02/19/2012   LDLDIRECT 207.4 07/21/2007   LDLCALC 50 02/19/2012   ALT 24 02/19/2012   AST 24 02/19/2012   NA 137 02/19/2012   K 3.5 02/19/2012   CL 101 02/19/2012   CREATININE 0.7 02/19/2012   BUN 14 02/19/2012   CO2 27 02/19/2012   TSH 0.91 02/19/2012   HGBA1C 6.0 02/19/2012   MICROALBUR 1.3 02/19/2012   i reviewed electrocardiogram    Assessment & Plan:  Heartburn, new Htn, with ? Of situational component   Subjective:   Patient here for Medicare annual wellness visit and management of other chronic and acute problems.     Risk factors: advanced age    Roster of Physicians Providing Medical Care to Patient:  See "snapshot"   Activities of Daily Living: In your present state of health, do you have any difficulty performing the following activities?:  Preparing food and eating?: No  Bathing yourself:  No  Getting dressed: No  Using the toilet:  No  Moving around from place to place: No  In the past year have you fallen or had a near fall?: No    Home Safety: Has smoke detector and wears seat belts. No firearms.   Diet and Exercise  Current exercise habits: pt says very good Dietary issues discussed: pt report a healthy diet   Depression Screen  Q1: Over the past two weeks, have you felt down, depressed or hopeless? no  Q2: Over the past two weeks, have you felt little interest or pleasure in doing things? no   The following portions of the patient's history were reviewed and updated as appropriate: allergies, current medications, past family history, past medical history, past social history, past surgical history and problem list.  Past Medical History  Diagnosis Date  . DIABETES MELLITUS, TYPE II 10/13/2007    Qualifier: Diagnosis of  By: Charlsie Quest RMA, Lucy    . COUGH DUE TO ACE INHIBITORS 11/25/2009    Qualifier: Diagnosis of  By: Everardo All MD, Cleophas Dunker   . HYPERLIPIDEMIA 10/13/2007    Qualifier: Diagnosis of  By: Tyrone Apple, Lucy    . HYPERTENSION 10/13/2007    Qualifier: Diagnosis of  By: Tyrone Apple, Lucy    . MENOPAUSAL SYNDROME 10/13/2007    Qualifier: Diagnosis of  By: Charlsie Quest RMA, Lucy    . OSTEOPOROSIS 10/13/2007    Qualifier: Diagnosis of  By: Samara Snide      Past Surgical History  Procedure Date  . Abdominal hysterectomy   . Breast biopsy 08/2002    History   Social History  . Marital Status: Married    Spouse Name: N/A    Number of Children: N/A  . Years of Education: N/A   Occupational History  . Retired     Social History Main Topics  . Smoking status: Never Smoker   . Smokeless tobacco: Not on file  . Alcohol Use: Yes  . Drug Use: No  . Sexually Active: Not on file   Other Topics Concern  . Not on file   Social History Narrative  . No narrative on file    Current Outpatient Prescriptions on File Prior to Visit  Medication Sig Dispense  Refill  . FREESTYLE LITE test strip TEST ONCE DAILY  100 each  2  . losartan (COZAAR) 100 MG tablet TAKE 1 TABLET EVERY DAY  90 tablet  0  Allergies  Allergen Reactions  . Lovastatin     REACTION: Rash    Family History  Problem Relation Age of Onset  . Cancer Neg Hx     BP 142/98  Pulse 92  Temp(Src) 98.2 F (36.8 C) (Oral)  Wt 161 lb (73.029 kg)  SpO2 98%  Review of Systems  Denies hearing loss, and visual loss Objective:   Vision:  Sees opthalmologist Hearing: grossly normal Body mass index:  See vs page Msk: pt easily and quickly performs "get-up-and-go" from a sitting position Cognitive Impairment Assessment: cognition, memory and judgment appear normal.  remembers 3/3 at 5 minutes.  excellent recall.  can easily read and write a sentence.  alert and oriented x 3   Assessment:   Medicare wellness utd on preventive parameters    Plan:   During the course of the visit the patient was educated and counseled about appropriate screening and preventive services including:        Fall prevention   Screening mammography  Bone densitometry screening  Diabetes screening  Nutrition counseling   Vaccines / LABS Zostavax / Pnemonccoal Vaccine  today  PSA  Patient Instructions (the written plan) was given to the patient.

## 2012-02-19 NOTE — Patient Instructions (Addendum)
please consider these measures for your health:  minimize alcohol.  do not use tobacco products.  have a colonoscopy at least every 10 years from age 66.  Women should have an annual mammogram from age 64.  keep firearms safely stored.  always use seat belts.  have working smoke alarms in your home.  see an eye doctor and dentist regularly.  never drive under the influence of alcohol or drugs (including prescription drugs).   You should have a vaccine against shingles (a painful rash which results from the  chickenpox infection which most people had many years ago).  This vaccine reduces, but does not totally eliminate the risk of shingles.  Because this is a medicare part d benefit, you should get it at a pharmacy.   please let me know what your wishes would be, if artificial life support measures should become necessary.  it is critically important to prevent falling down (keep floor areas well-lit, dry, and free of loose objects.  Also, try not to rush).   Please come back within a few weeks, for a blood-pressure recheck.  blood tests are being requested for you today.  You will receive a letter with results.  Take prilosec 20 mg daily.  Here are some samples.   (update: we discussed code status.  pt requests full code, but would not want to be started or maintained on artificial life-support measures if there was not a reasonable chance of recovery) (see letter)

## 2012-02-20 ENCOUNTER — Encounter: Payer: Self-pay | Admitting: Endocrinology

## 2012-02-22 ENCOUNTER — Encounter: Payer: Self-pay | Admitting: Endocrinology

## 2012-02-22 LAB — PTH, INTACT AND CALCIUM: PTH: 97.4 pg/mL — ABNORMAL HIGH (ref 14.0–72.0)

## 2012-02-23 ENCOUNTER — Telehealth: Payer: Self-pay | Admitting: *Deleted

## 2012-02-23 NOTE — Telephone Encounter (Signed)
Called pt to inform of CPX results. Pt informed of results. (Letter also mailed to pt)

## 2012-02-24 ENCOUNTER — Telehealth: Payer: Self-pay | Admitting: *Deleted

## 2012-02-24 NOTE — Telephone Encounter (Signed)
Called pt to inform of PTH results, pt informed. (Letter also mailed to pt)

## 2012-05-10 ENCOUNTER — Other Ambulatory Visit: Payer: Self-pay | Admitting: Endocrinology

## 2012-05-18 ENCOUNTER — Other Ambulatory Visit: Payer: Self-pay | Admitting: Endocrinology

## 2012-05-20 ENCOUNTER — Telehealth: Payer: Self-pay | Admitting: Endocrinology

## 2012-05-20 NOTE — Telephone Encounter (Signed)
Rx refilled 05/18/2012

## 2012-05-20 NOTE — Telephone Encounter (Signed)
The pt called the triage line and is hoping to get her rx of Losartan refilled to [facility] in [address]  Call back - [phone]  Thanks!

## 2012-05-20 NOTE — Telephone Encounter (Signed)
Rx was not received by [facility], rx called into pharmacy. Pt informed.

## 2012-05-24 NOTE — Telephone Encounter (Signed)
Please refill prn 

## 2012-08-26 ENCOUNTER — Other Ambulatory Visit: Payer: Self-pay | Admitting: Gynecology

## 2012-08-26 DIAGNOSIS — Z1231 Encounter for screening mammogram for malignant neoplasm of breast: Secondary | ICD-10-CM

## 2012-09-12 ENCOUNTER — Other Ambulatory Visit: Payer: Self-pay | Admitting: Endocrinology

## 2012-09-21 ENCOUNTER — Ambulatory Visit
Admission: RE | Admit: 2012-09-21 | Discharge: 2012-09-21 | Disposition: A | Discharge status: Home / Self Care | Payer: Medicare Other | Source: Ambulatory Visit | Attending: Gynecology | Admitting: Gynecology

## 2012-09-21 DIAGNOSIS — Z1231 Encounter for screening mammogram for malignant neoplasm of breast: Secondary | ICD-10-CM

## 2013-06-12 ENCOUNTER — Encounter: Payer: Self-pay | Admitting: Endocrinology

## 2013-06-12 ENCOUNTER — Ambulatory Visit (INDEPENDENT_AMBULATORY_CARE_PROVIDER_SITE_OTHER): Payer: Medicare Other | Admitting: Endocrinology

## 2013-06-12 VITALS — BP 128/84 | HR 110 | Ht 62.0 in | Wt 157.0 lb

## 2013-06-12 DIAGNOSIS — D751 Secondary polycythemia: Secondary | ICD-10-CM

## 2013-06-12 DIAGNOSIS — I10 Essential (primary) hypertension: Secondary | ICD-10-CM

## 2013-06-12 DIAGNOSIS — E785 Hyperlipidemia, unspecified: Secondary | ICD-10-CM

## 2013-06-12 DIAGNOSIS — Z0000 Encounter for general adult medical examination without abnormal findings: Secondary | ICD-10-CM

## 2013-06-12 DIAGNOSIS — E210 Primary hyperparathyroidism: Secondary | ICD-10-CM

## 2013-06-12 DIAGNOSIS — E119 Type 2 diabetes mellitus without complications: Secondary | ICD-10-CM

## 2013-06-12 DIAGNOSIS — M810 Age-related osteoporosis without current pathological fracture: Secondary | ICD-10-CM

## 2013-06-12 DIAGNOSIS — Z79899 Other long term (current) drug therapy: Secondary | ICD-10-CM

## 2013-06-12 HISTORY — DX: Primary hyperparathyroidism: E21.0

## 2013-06-12 LAB — URINALYSIS, ROUTINE W REFLEX MICROSCOPIC
Bilirubin Urine: NEGATIVE
Ketones, ur: NEGATIVE
Leukocytes, UA: NEGATIVE
pH: 6 (ref 5.0–8.0)

## 2013-06-12 LAB — CBC WITH DIFFERENTIAL/PLATELET
Basophils Absolute: 0 10*3/uL (ref 0.0–0.1)
Eosinophils Absolute: 0.1 10*3/uL (ref 0.0–0.7)
HCT: 46.2 % — ABNORMAL HIGH (ref 36.0–46.0)
Hemoglobin: 15.9 g/dL — ABNORMAL HIGH (ref 12.0–15.0)
Lymphs Abs: 2.7 10*3/uL (ref 0.7–4.0)
MCHC: 34.4 g/dL (ref 30.0–36.0)
Monocytes Relative: 6.2 % (ref 3.0–12.0)
Neutro Abs: 10.2 10*3/uL — ABNORMAL HIGH (ref 1.4–7.7)
RDW: 12.6 % (ref 11.5–14.6)

## 2013-06-12 LAB — HEPATIC FUNCTION PANEL
ALT: 29 U/L (ref 0–35)
AST: 26 U/L (ref 0–37)
Albumin: 4.4 g/dL (ref 3.5–5.2)
Alkaline Phosphatase: 81 U/L (ref 39–117)
Total Protein: 8.1 g/dL (ref 6.0–8.3)

## 2013-06-12 LAB — HEMOGLOBIN A1C: Hgb A1c MFr Bld: 6.1 % (ref 4.6–6.5)

## 2013-06-12 LAB — LIPID PANEL
Cholesterol: 124 mg/dL (ref 0–200)
HDL: 34.2 mg/dL — ABNORMAL LOW (ref >39.00)
Triglycerides: 222 mg/dL — ABNORMAL HIGH (ref 0.0–149.0)

## 2013-06-12 LAB — BASIC METABOLIC PANEL
BUN: 10 mg/dL (ref 6–23)
CO2: 24 mEq/L (ref 19–32)
Calcium: 10.9 mg/dL — ABNORMAL HIGH (ref 8.4–10.5)
Creatinine, Ser: 0.9 mg/dL (ref 0.4–1.2)
Glucose, Bld: 139 mg/dL — ABNORMAL HIGH (ref 70–99)

## 2013-06-12 NOTE — Patient Instructions (Addendum)
please consider these measures for your health:  minimize alcohol.  do not use tobacco products.  have a colonoscopy at least every 10 years from age 67.  Women should have an annual mammogram from age 68.  keep firearms safely stored.  always use seat belts.  have working smoke alarms in your home.  see an eye doctor and dentist regularly.  never drive under the influence of alcohol or drugs (including prescription drugs).   blood tests are being requested for you today.  We'll contact you with results. good diet and exercise habits significanly improve the control of your diabetes.  please let me know if you wish to be referred to a dietician.  high blood sugar is very risky to your health.  you should see an eye doctor every year.  You are at higher than average risk for pneumonia and hepatitis-B.  You should be vaccinated against both.   Please return in 6 months.

## 2013-06-12 NOTE — Progress Notes (Signed)
Subjective:    Patient ID: Brandi Walsh, female    DOB: [birth year], 67 y.o.   MRN: [id number]  HPI The state of at least three ongoing medical problems is addressed today, with interval history of each noted here: Hyperglycemia: she has lost a few lbs, due to her efforts.  Leukocytosis: gerd is well-controlled HTN: she denies sob. Past Medical History  Diagnosis Date  . DIABETES MELLITUS, TYPE II 10/13/2007    Qualifier: Diagnosis of  By: Charlsie Quest RMA, Lucy    . COUGH DUE TO ACE INHIBITORS 11/25/2009    Qualifier: Diagnosis of  By: Everardo All MD, Cleophas Dunker   . HYPERLIPIDEMIA 10/13/2007    Qualifier: Diagnosis of  By: Tyrone Apple, Lucy    . HYPERTENSION 10/13/2007    Qualifier: Diagnosis of  By: Tyrone Apple, Lucy    . MENOPAUSAL SYNDROME 10/13/2007    Qualifier: Diagnosis of  By: Charlsie Quest RMA, Lucy    . OSTEOPOROSIS 10/13/2007    Qualifier: Diagnosis of  By: Samara Snide      Past Surgical History  Procedure Laterality Date  . Abdominal hysterectomy    . Breast biopsy  08/2002    History   Social History  . Marital Status: Married    Spouse Name: N/A    Number of Children: N/A  . Years of Education: N/A   Occupational History  . Retired     Social History Main Topics  . Smoking status: Never Smoker   . Smokeless tobacco: Not on file  . Alcohol Use: Yes  . Drug Use: No  . Sexually Active: Not on file   Other Topics Concern  . Not on file   Social History Narrative  . No narrative on file    Current Outpatient Prescriptions on File Prior to Visit  Medication Sig Dispense Refill  . CRESTOR 40 MG tablet TAKE 1 TABLET EVERY DAY  90 tablet  2  . FREESTYLE LITE test strip TEST ONCE DAILY  100 each  3  . losartan (COZAAR) 100 MG tablet TAKE 1 TABLET EVERY DAY  90 tablet  3  . omeprazole (PRILOSEC) 20 MG capsule Take 20 mg by mouth daily.       No current facility-administered medications on file prior to visit.    Allergies  Allergen Reactions  . Lovastatin      REACTION: Rash    Family History  Problem Relation Age of Onset  . Cancer Neg Hx     BP 128/84  Pulse 110  Ht 5\' 2"  (1.575 m)  Wt 157 lb (71.215 kg)  BMI 28.71 kg/m2  SpO2 98%     Review of Systems Denies edema.  Denies chest pain.    Objective:   Physical Exam VS: see vs page GEN: no distress HEAD: head: no deformity eyes: no periorbital swelling, no proptosis external nose and ears are normal mouth: no lesion seen NECK: supple, thyroid is not enlarged CHEST WALL: no deformity LUNGS:  Clear to auscultation BREASTS:  sees gyn CV: reg rate and rhythm, no murmur GENITALIA/RECTAL: sees gyn MUSCULOSKELETAL: muscle bulk and strength are grossly normal.  no obvious joint swelling.  gait is normal and steady PULSES: no carotid bruit NEURO:  cn 2-12 grossly intact.   readily moves all 4's.   SKIN:  Normal texture and temperature.  No rash or suspicious lesion is visible.   NODES:  None palpable at the neck.   PSYCH: alert, oriented x3.  Does not appear anxious nor  depressed.    Lab Results  Component Value Date   WBC 13.9* 06/12/2013   HGB 15.9* 06/12/2013   HCT 46.2* 06/12/2013   PLT 255.0 06/12/2013   GLUCOSE 139* 06/12/2013   CHOL 124 06/12/2013   TRIG 222.0* 06/12/2013   HDL 34.20* 06/12/2013   LDLDIRECT 61.8 06/12/2013   LDLCALC 50 02/19/2012   ALT 29 06/12/2013   AST 26 06/12/2013   NA 138 06/12/2013   K 3.7 06/12/2013   CL 105 06/12/2013   CREATININE 0.9 06/12/2013   BUN 10 06/12/2013   CO2 24 06/12/2013   TSH 0.85 06/12/2013   HGBA1C 6.1 06/12/2013   MICROALBUR 0.1 06/12/2013       Assessment & Plan:  Primary hyperparathyroidism, mild, unchanged Leukocytosis, recurrent, mild DM: well-controlled HTN: well-controlled   Subjective:     Subjective:   Patient here for Medicare annual wellness visit and management of other chronic and acute problems.     Risk factors: advanced age    Roster of Physicians Providing Medical Care to Patient:  See  "snapshot"   Activities of Daily Living: In your present state of health, do you have any difficulty performing the following activities?:  Preparing food and eating?: No  Bathing yourself: No  Getting dressed: No  Using the toilet:No  Moving around from place to place: No  In the past year have you fallen or had a near fall?:No    Home Safety: Has smoke detector and wears seat belts. No firearms. No excess sun exposure.  Diet and Exercise  Current exercise habits:  Dietary issues discussed: healthy diet   Depression Screen  Q1: Over the past two weeks, have you felt down, depressed or hopeless?no  Q2: Over the past two weeks, have you felt little interest or pleasure in doing things? no   The following portions of the patient's history were reviewed and updated as appropriate: allergies, current medications, past family history, past medical history, past social history, past surgical history and problem list.  Past Medical History  Diagnosis Date  . DIABETES MELLITUS, TYPE II 10/13/2007    Qualifier: Diagnosis of  By: Charlsie Quest RMA, Lucy    . COUGH DUE TO ACE INHIBITORS 11/25/2009    Qualifier: Diagnosis of  By: Everardo All MD, Cleophas Dunker   . HYPERLIPIDEMIA 10/13/2007    Qualifier: Diagnosis of  By: Tyrone Apple, Lucy    . HYPERTENSION 10/13/2007    Qualifier: Diagnosis of  By: Tyrone Apple, Lucy    . MENOPAUSAL SYNDROME 10/13/2007    Qualifier: Diagnosis of  By: Charlsie Quest RMA, Lucy    . OSTEOPOROSIS 10/13/2007    Qualifier: Diagnosis of  By: Samara Snide      Past Surgical History  Procedure Laterality Date  . Abdominal hysterectomy    . Breast biopsy  08/2002    History   Social History  . Marital Status: Married    Spouse Name: N/A    Number of Children: N/A  . Years of Education: N/A   Occupational History  . Retired     Social History Main Topics  . Smoking status: Never Smoker   . Smokeless tobacco: Not on file  . Alcohol Use: Yes  . Drug Use: No  . Sexually Active:  Not on file   Other Topics Concern  . Not on file   Social History Narrative  . No narrative on file    Current Outpatient Prescriptions on File Prior to Visit  Medication Sig Dispense Refill  .  CRESTOR 40 MG tablet TAKE 1 TABLET EVERY DAY  90 tablet  2  . FREESTYLE LITE test strip TEST ONCE DAILY  100 each  3  . losartan (COZAAR) 100 MG tablet TAKE 1 TABLET EVERY DAY  90 tablet  3  . omeprazole (PRILOSEC) 20 MG capsule Take 20 mg by mouth daily.       No current facility-administered medications on file prior to visit.    Allergies  Allergen Reactions  . Lovastatin     REACTION: Rash    Family History  Problem Relation Age of Onset  . Cancer Neg Hx     BP 128/84  Pulse 110  Ht 5\' 2"  (1.575 m)  Wt 157 lb (71.215 kg)  BMI 28.71 kg/m2  SpO2 98%   Review of Systems  Denies hearing loss, and visual loss Objective:   Vision:  Sees opthalmologist Hearing: grossly normal Body mass index:  See vs page Msk: pt easily and quickly performs "get-up-and-go" from a sitting position Cognitive Impairment Assessment: cognition, memory and judgment appear normal.  remembers 3/3 at 5 minutes.  excellent recall.  can easily read and write a sentence.  alert and oriented x 3   Assessment:   Medicare wellness utd on preventive parameters    Plan:   During the course of the visit the patient was educated and counseled about appropriate screening and preventive services including:       Fall prevention   Screening mammography  Bone densitometry screening  Diabetes screening  Nutrition counseling    Patient Instructions (the written plan) was given to the patient.   we discussed code status.  pt requests full code, but would not want to be started or maintained on artificial life-support measures if there was not a reasonable chance of recovery.

## 2013-06-13 ENCOUNTER — Telehealth: Payer: Self-pay | Admitting: *Deleted

## 2013-06-13 DIAGNOSIS — Z0001 Encounter for general adult medical examination with abnormal findings: Secondary | ICD-10-CM | POA: Insufficient documentation

## 2013-06-13 NOTE — Telephone Encounter (Signed)
Called pt and advised her that there was minor abnormalities (calcuim and white blood cells) are again noted. No rx is needed for these. Rest of labs are normal. Please continue the same medications. Pt understood.

## 2013-07-06 ENCOUNTER — Other Ambulatory Visit: Payer: Self-pay

## 2013-07-06 MED ORDER — LOSARTAN POTASSIUM 100 MG PO TABS: ORAL_TABLET | ORAL | Status: DC

## 2013-08-09 ENCOUNTER — Other Ambulatory Visit: Payer: Self-pay

## 2013-08-09 MED ORDER — GLUCOSE BLOOD VI STRP: ORAL_STRIP | Status: DC

## 2013-11-22 ENCOUNTER — Other Ambulatory Visit: Payer: Self-pay

## 2013-11-22 MED ORDER — ROSUVASTATIN CALCIUM 40 MG PO TABS: ORAL_TABLET | ORAL | Status: DC

## 2014-02-27 ENCOUNTER — Other Ambulatory Visit: Payer: Self-pay

## 2014-05-05 ENCOUNTER — Other Ambulatory Visit: Payer: Self-pay | Admitting: Family Medicine

## 2014-07-24 ENCOUNTER — Other Ambulatory Visit: Payer: Self-pay | Admitting: Endocrinology

## 2014-08-13 ENCOUNTER — Telehealth: Payer: Self-pay | Admitting: Endocrinology

## 2014-08-13 MED ORDER — GLUCOSE BLOOD VI STRP
ORAL_STRIP | Status: DC
Start: 2014-08-13 — End: 2019-03-15

## 2014-08-13 NOTE — Telephone Encounter (Signed)
Pt made CPE appt can we refill the test strips until that appt at least

## 2014-08-13 NOTE — Telephone Encounter (Signed)
Rx sent for a 30 day supply.  

## 2014-09-05 ENCOUNTER — Encounter: Payer: Self-pay | Admitting: Endocrinology

## 2014-09-05 ENCOUNTER — Ambulatory Visit (INDEPENDENT_AMBULATORY_CARE_PROVIDER_SITE_OTHER): Payer: Medicare Other | Admitting: Endocrinology

## 2014-09-05 VITALS — BP 134/88 | HR 116 | Temp 98.1°F | Ht 62.0 in | Wt 157.0 lb

## 2014-09-05 DIAGNOSIS — D751 Secondary polycythemia: Secondary | ICD-10-CM

## 2014-09-05 DIAGNOSIS — E785 Hyperlipidemia, unspecified: Secondary | ICD-10-CM | POA: Diagnosis not present

## 2014-09-05 DIAGNOSIS — E119 Type 2 diabetes mellitus without complications: Secondary | ICD-10-CM | POA: Diagnosis not present

## 2014-09-05 DIAGNOSIS — M810 Age-related osteoporosis without current pathological fracture: Secondary | ICD-10-CM

## 2014-09-05 DIAGNOSIS — Z0000 Encounter for general adult medical examination without abnormal findings: Secondary | ICD-10-CM | POA: Diagnosis not present

## 2014-09-05 DIAGNOSIS — Z23 Encounter for immunization: Secondary | ICD-10-CM | POA: Diagnosis not present

## 2014-09-05 DIAGNOSIS — I10 Essential (primary) hypertension: Secondary | ICD-10-CM | POA: Diagnosis not present

## 2014-09-05 DIAGNOSIS — E210 Primary hyperparathyroidism: Secondary | ICD-10-CM

## 2014-09-05 LAB — CBC WITH DIFFERENTIAL/PLATELET
BASOS ABS: 0 10*3/uL (ref 0.0–0.1)
Basophils Relative: 0.3 % (ref 0.0–3.0)
Eosinophils Absolute: 0.1 10*3/uL (ref 0.0–0.7)
Eosinophils Relative: 0.6 % (ref 0.0–5.0)
HEMATOCRIT: 44.9 % (ref 36.0–46.0)
HEMOGLOBIN: 15.3 g/dL — AB (ref 12.0–15.0)
LYMPHS PCT: 9.9 % — AB (ref 12.0–46.0)
Lymphs Abs: 1.4 10*3/uL (ref 0.7–4.0)
MCHC: 34.1 g/dL (ref 30.0–36.0)
MCV: 90.9 fl (ref 78.0–100.0)
MONOS PCT: 5.3 % (ref 3.0–12.0)
Monocytes Absolute: 0.8 10*3/uL (ref 0.1–1.0)
NEUTROS ABS: 12.2 10*3/uL — AB (ref 1.4–7.7)
Neutrophils Relative %: 83.9 % — ABNORMAL HIGH (ref 43.0–77.0)
Platelets: 305 10*3/uL (ref 150.0–400.0)
RBC: 4.94 Mil/uL (ref 3.87–5.11)
RDW: 12.4 % (ref 11.5–15.5)
WBC: 14.6 10*3/uL — ABNORMAL HIGH (ref 4.0–10.5)

## 2014-09-05 LAB — LIPID PANEL
CHOL/HDL RATIO: 4
Cholesterol: 116 mg/dL (ref 0–200)
HDL: 29.8 mg/dL — AB (ref >39.00)
LDL Cholesterol: 47 mg/dL (ref 0–99)
NONHDL: 86.2
Triglycerides: 198 mg/dL — ABNORMAL HIGH (ref 0.0–149.0)
VLDL: 39.6 mg/dL (ref 0.0–40.0)

## 2014-09-05 LAB — BASIC METABOLIC PANEL WITH GFR
BUN: 10 mg/dL (ref 6–23)
CO2: 20 meq/L (ref 19–32)
Calcium: 10.9 mg/dL — ABNORMAL HIGH (ref 8.4–10.5)
Chloride: 104 meq/L (ref 96–112)
Creatinine, Ser: 0.8 mg/dL (ref 0.4–1.2)
GFR: 71.52 mL/min
Glucose, Bld: 118 mg/dL — ABNORMAL HIGH (ref 70–99)
Potassium: 3.8 meq/L (ref 3.5–5.1)
Sodium: 135 meq/L (ref 135–145)

## 2014-09-05 LAB — MICROALBUMIN / CREATININE URINE RATIO
CREATININE, U: 260.3 mg/dL
MICROALB UR: 2 mg/dL — AB (ref 0.0–1.9)
MICROALB/CREAT RATIO: 0.8 mg/g (ref 0.0–30.0)

## 2014-09-05 LAB — URINALYSIS, ROUTINE W REFLEX MICROSCOPIC
BILIRUBIN URINE: NEGATIVE
HGB URINE DIPSTICK: NEGATIVE
KETONES UR: NEGATIVE
Leukocytes, UA: NEGATIVE
NITRITE: NEGATIVE
PH: 6.5 (ref 5.0–8.0)
Specific Gravity, Urine: 1.02 (ref 1.000–1.030)
Total Protein, Urine: NEGATIVE
Urine Glucose: NEGATIVE
Urobilinogen, UA: 0.2 (ref 0.0–1.0)

## 2014-09-05 LAB — HEPATIC FUNCTION PANEL
ALBUMIN: 3.9 g/dL (ref 3.5–5.2)
ALK PHOS: 83 U/L (ref 39–117)
ALT: 32 U/L (ref 0–35)
AST: 34 U/L (ref 0–37)
Bilirubin, Direct: 0 mg/dL (ref 0.0–0.3)
TOTAL PROTEIN: 8.1 g/dL (ref 6.0–8.3)
Total Bilirubin: 1 mg/dL (ref 0.2–1.2)

## 2014-09-05 LAB — TSH: TSH: 0.19 u[IU]/mL — ABNORMAL LOW (ref 0.35–4.50)

## 2014-09-05 LAB — HEMOGLOBIN A1C: Hgb A1c MFr Bld: 5.9 % (ref 4.6–6.5)

## 2014-09-05 NOTE — Progress Notes (Signed)
Subjective:    Patient ID: Brandi Walsh, female    DOB: 08/21/1946, 68 y.o.   MRN: [id number]  HPI The state of at least three ongoing medical problems is addressed today, with interval history of each noted here: Polycythemia: she denies sob. DM: she denies weight change. Hypercalcemia: she denies cramps. Past Medical History  Diagnosis Date  . DIABETES MELLITUS, TYPE II 10/13/2007    Qualifier: Diagnosis of  By: Charlsie QuestBrand RMA, Lucy    . COUGH DUE TO ACE INHIBITORS 11/25/2009    Qualifier: Diagnosis of  By: Everardo AllEllison MD, Cleophas DunkerSean A   . HYPERLIPIDEMIA 10/13/2007    Qualifier: Diagnosis of  By: Tyrone AppleBrand RMA, Lucy    . HYPERTENSION 10/13/2007    Qualifier: Diagnosis of  By: Tyrone AppleBrand RMA, Lucy    . MENOPAUSAL SYNDROME 10/13/2007    Qualifier: Diagnosis of  By: Charlsie QuestBrand RMA, Lucy    . OSTEOPOROSIS 10/13/2007    Qualifier: Diagnosis of  By: Samara SnideBrand RMA, Lucy      Past Surgical History  Procedure Laterality Date  . Abdominal hysterectomy    . Breast biopsy  08/2002    History   Social History  . Marital Status: Married    Spouse Name: N/A    Number of Children: N/A  . Years of Education: N/A   Occupational History  . Retired     Social History Main Topics  . Smoking status: Never Smoker   . Smokeless tobacco: Not on file  . Alcohol Use: Yes  . Drug Use: No  . Sexual Activity: Not on file   Other Topics Concern  . Not on file   Social History Narrative  . No narrative on file    Current Outpatient Prescriptions on File Prior to Visit  Medication Sig Dispense Refill  . glucose blood (FREESTYLE LITE) test strip TEST ONCE DAILY  50 each  0  . losartan (COZAAR) 100 MG tablet TAKE 1 TABLET EVERY DAY  90 tablet  0  . omeprazole (PRILOSEC) 20 MG capsule Take 20 mg by mouth daily.      . rosuvastatin (CRESTOR) 40 MG tablet TAKE 1 TABLET EVERY DAY  90 tablet  2   No current facility-administered medications on file prior to visit.    Allergies  Allergen Reactions  . Lovastatin     REACTION: Rash    Family History  Problem Relation Age of Onset  . Cancer Neg Hx     BP 134/88  Pulse 116  Temp(Src) 98.1 F (36.7 C) (Oral)  Ht 5\' 2"  (1.575 m)  Wt 157 lb (71.215 kg)  BMI 28.71 kg/m2  SpO2 91%   Review of Systems Denies edema and chest pain    Objective:   Physical Exam VS: see vs page GEN: no distress HEAD: head: no deformity eyes: no periorbital swelling, no proptosis external nose and ears are normal mouth: no lesion seen NECK: supple, thyroid is not enlarged CHEST WALL: no deformity LUNGS:  Clear to auscultation BREASTS: sees gyn.   CV: reg rate and rhythm, no murmur ABD: abdomen is soft, nontender.  no hepatosplenomegaly.  not distended.  no hernia GENITALIA/RECTAL: sees gyn MUSCULOSKELETAL: muscle bulk and strength are grossly normal.  no obvious joint swelling.  gait is normal and steady EXTEMITIES: no deformity.  no ulcer on the feet.  feet are of normal color and temp.  no edema PULSES: dorsalis pedis intact bilat.  no carotid bruit NEURO:  cn 2-12 grossly intact.   readily  moves all 4's.  sensation is intact to touch on the feet SKIN:  Normal texture and temperature.  No rash or suspicious lesion is visible.   NODES:  None palpable at the neck PSYCH: alert, well-oriented.  Does not appear anxious nor depressed.  i reviewed electrocardiogram.    Lab Results  Component Value Date   WBC 14.6* 09/05/2014   HGB 15.3* 09/05/2014   HCT 44.9 09/05/2014   PLT 305.0 09/05/2014   GLUCOSE 118* 09/05/2014   CHOL 116 09/05/2014   TRIG 198.0* 09/05/2014   HDL 29.80* 09/05/2014   LDLDIRECT 61.8 06/12/2013   LDLCALC 47 09/05/2014   ALT 32 09/05/2014   AST 34 09/05/2014   NA 135 09/05/2014   K 3.8 09/05/2014   CL 104 09/05/2014   CREATININE 0.8 09/05/2014   BUN 10 09/05/2014   CO2 20 09/05/2014   TSH 0.19* 09/05/2014   HGBA1C 5.9 09/05/2014   MICROALBUR 2.0* 09/05/2014   Lab Results  Component Value Date   PTH 59 09/05/2014   CALCIUM 10.6* 09/05/2014    CALCIUM 10.9* 09/05/2014       Assessment & Plan:  Suppressed TSH, new, uncertain etiology.  We'll recheck Polycythemia: better, but elev WBC persists, uncertain etiology.  We'll follow. Hypercalcemia: persists, but PTH is better: we'll follow. DM: well-controlled.  Same rx.      Subjective:   Patient here for Medicare annual wellness visit and management of other chronic and acute problems.     Risk factors: advanced age    Roster of Physicians Providing Medical Care to Patient:  See "snapshot"   Activities of Daily Living: In your present state of health, do you have any difficulty performing the following activities?:  Preparing food and eating?: No  Bathing yourself: No  Getting dressed: No  Using the toilet:No  Moving around from place to place: No  In the past year have you fallen or had a near fall?: No    Home Safety: Has smoke detector and wears seat belts. No firearms.  Diet and Exercise  Current exercise habits: pt says good Dietary issues discussed: pt reports a healthy diet   Depression Screen  Q1: Over the past two weeks, have you felt down, depressed or hopeless?no  Q2: Over the past two weeks, have you felt little interest or pleasure in doing things? no   The following portions of the patient's history were reviewed and updated as appropriate: allergies, current medications, past family history, past medical history, past social history, past surgical history and problem list.  Past Medical History  Diagnosis Date  . DIABETES MELLITUS, TYPE II 10/13/2007    Qualifier: Diagnosis of  By: Charlsie Quest RMA, Lucy    . COUGH DUE TO ACE INHIBITORS 11/25/2009    Qualifier: Diagnosis of  By: Everardo All MD, Cleophas Dunker   . HYPERLIPIDEMIA 10/13/2007    Qualifier: Diagnosis of  By: Tyrone Apple, Lucy    . HYPERTENSION 10/13/2007    Qualifier: Diagnosis of  By: Tyrone Apple, Lucy    . MENOPAUSAL SYNDROME 10/13/2007    Qualifier: Diagnosis of  By: Charlsie Quest RMA, Lucy    . OSTEOPOROSIS  10/13/2007    Qualifier: Diagnosis of  By: Samara Snide      Past Surgical History  Procedure Laterality Date  . Abdominal hysterectomy    . Breast biopsy  08/2002    History   Social History  . Marital Status: Married    Spouse Name: N/A    Number  of Children: N/A  . Years of Education: N/A   Occupational History  . Retired     Social History Main Topics  . Smoking status: Never Smoker   . Smokeless tobacco: Not on file  . Alcohol Use: Yes  . Drug Use: No  . Sexual Activity: Not on file   Other Topics Concern  . Not on file   Social History Narrative  . No narrative on file    Current Outpatient Prescriptions on File Prior to Visit  Medication Sig Dispense Refill  . glucose blood (FREESTYLE LITE) test strip TEST ONCE DAILY  50 each  0  . losartan (COZAAR) 100 MG tablet TAKE 1 TABLET EVERY DAY  90 tablet  0  . omeprazole (PRILOSEC) 20 MG capsule Take 20 mg by mouth daily.      . rosuvastatin (CRESTOR) 40 MG tablet TAKE 1 TABLET EVERY DAY  90 tablet  2   No current facility-administered medications on file prior to visit.    Allergies  Allergen Reactions  . Lovastatin     REACTION: Rash    Family History  Problem Relation Age of Onset  . Cancer Neg Hx     BP 134/88  Pulse 116  Temp(Src) 98.1 F (36.7 C) (Oral)  Ht 5\' 2"  (1.575 m)  Wt 157 lb (71.215 kg)  BMI 28.71 kg/m2  SpO2 91%   Review of Systems  Denies hearing loss, and visual loss Objective:   Vision:  Sees opthalmologist Hearing: grossly normal Body mass index:  See vs page Msk: pt easily and quickly performs "get-up-and-go" from a sitting position Cognitive Impairment Assessment: cognition, memory and judgment appear normal.  remembers 3/3 at 5 minutes.  excellent recall.  can easily read and write a sentence.  alert and oriented x 3   Assessment:   Medicare wellness utd on preventive parameters    Plan:   During the course of the visit the patient was educated and counseled  about appropriate screening and preventive services including:        Fall prevention   Screening mammography  Bone densitometry screening  Diabetes screening  Nutrition counseling   Vaccines / LABS Zostavax / Pneumococcal Vaccine  today  Patient Instructions (the written plan) was given to the patient.

## 2014-09-05 NOTE — Patient Instructions (Addendum)
good diet and exercise habits significanly improve the control of your diabetes.  please let me know if you wish to be referred to a dietician.  high blood sugar is very risky to your health.  you should see an eye doctor and dentist every year.  It is very important to get all recommended vaccinations.  please consider these measures for your health:  minimize alcohol.  do not use tobacco products.  have a colonoscopy at least every 10 years from age 68.  Women should have an annual mammogram from age 240.  keep firearms safely stored.  always use seat belts.  have working smoke alarms in your home.  see an eye doctor and dentist regularly.  never drive under the influence of alcohol or drugs (including prescription drugs).   blood tests are being requested for you today.  We'll contact you with results.

## 2014-09-06 LAB — PTH, INTACT AND CALCIUM
Calcium: 10.6 mg/dL — ABNORMAL HIGH (ref 8.4–10.5)
PTH: 59 pg/mL (ref 14–64)

## 2014-09-13 ENCOUNTER — Ambulatory Visit (INDEPENDENT_AMBULATORY_CARE_PROVIDER_SITE_OTHER)
Admission: RE | Admit: 2014-09-13 | Discharge: 2014-09-13 | Disposition: A | Discharge status: Home / Self Care | Payer: Medicare Other | Source: Ambulatory Visit | Attending: Endocrinology | Admitting: Endocrinology

## 2014-09-13 DIAGNOSIS — M810 Age-related osteoporosis without current pathological fracture: Secondary | ICD-10-CM

## 2014-09-13 DIAGNOSIS — Z23 Encounter for immunization: Secondary | ICD-10-CM

## 2014-09-13 DIAGNOSIS — Z0000 Encounter for general adult medical examination without abnormal findings: Secondary | ICD-10-CM

## 2014-09-13 DIAGNOSIS — E119 Type 2 diabetes mellitus without complications: Secondary | ICD-10-CM

## 2014-09-13 DIAGNOSIS — I10 Essential (primary) hypertension: Secondary | ICD-10-CM

## 2014-09-13 DIAGNOSIS — E210 Primary hyperparathyroidism: Secondary | ICD-10-CM

## 2014-09-13 DIAGNOSIS — E785 Hyperlipidemia, unspecified: Secondary | ICD-10-CM

## 2014-10-09 ENCOUNTER — Other Ambulatory Visit: Payer: Self-pay

## 2014-10-09 ENCOUNTER — Other Ambulatory Visit (INDEPENDENT_AMBULATORY_CARE_PROVIDER_SITE_OTHER): Payer: Medicare Other

## 2014-10-09 DIAGNOSIS — R7989 Other specified abnormal findings of blood chemistry: Secondary | ICD-10-CM

## 2014-10-09 DIAGNOSIS — E785 Hyperlipidemia, unspecified: Secondary | ICD-10-CM | POA: Diagnosis not present

## 2014-10-09 LAB — TSH: TSH: 0.83 u[IU]/mL (ref 0.35–4.50)

## 2014-10-22 ENCOUNTER — Other Ambulatory Visit: Payer: Self-pay | Admitting: Endocrinology

## 2014-10-31 ENCOUNTER — Other Ambulatory Visit: Payer: Self-pay

## 2014-10-31 ENCOUNTER — Telehealth: Payer: Self-pay | Admitting: Endocrinology

## 2014-10-31 MED ORDER — ROSUVASTATIN CALCIUM 40 MG PO TABS: ORAL_TABLET | ORAL | Status: DC

## 2014-10-31 NOTE — Telephone Encounter (Signed)
Pt has not been able to get [facility]artan [facility] we [facility] it in on 11/24 but the pharmacy is not giving her any. Please call the pharmacy

## 2014-10-31 NOTE — Telephone Encounter (Signed)
Contacted [facility] and gave verbal on Losartan rx. Pt advise.

## 2015-08-13 ENCOUNTER — Other Ambulatory Visit: Payer: Self-pay | Admitting: Endocrinology

## 2016-01-29 ENCOUNTER — Encounter: Payer: Self-pay | Admitting: Gastroenterology

## 2016-03-25 ENCOUNTER — Encounter: Payer: Medicare Other | Admitting: Endocrinology

## 2016-03-27 ENCOUNTER — Other Ambulatory Visit: Payer: Self-pay | Admitting: Endocrinology

## 2016-03-31 ENCOUNTER — Other Ambulatory Visit: Payer: Self-pay | Admitting: Endocrinology

## 2016-04-21 ENCOUNTER — Encounter: Payer: Self-pay | Admitting: Endocrinology

## 2016-04-21 ENCOUNTER — Ambulatory Visit (INDEPENDENT_AMBULATORY_CARE_PROVIDER_SITE_OTHER): Payer: Medicare Other | Admitting: Endocrinology

## 2016-04-21 VITALS — BP 126/84 | HR 68 | Temp 98.5°F | Ht 62.0 in | Wt 147.0 lb

## 2016-04-21 DIAGNOSIS — E785 Hyperlipidemia, unspecified: Secondary | ICD-10-CM

## 2016-04-21 DIAGNOSIS — M810 Age-related osteoporosis without current pathological fracture: Secondary | ICD-10-CM

## 2016-04-21 DIAGNOSIS — I10 Essential (primary) hypertension: Secondary | ICD-10-CM

## 2016-04-21 DIAGNOSIS — E119 Type 2 diabetes mellitus without complications: Secondary | ICD-10-CM

## 2016-04-21 DIAGNOSIS — Z0000 Encounter for general adult medical examination without abnormal findings: Secondary | ICD-10-CM | POA: Diagnosis not present

## 2016-04-21 DIAGNOSIS — E210 Primary hyperparathyroidism: Secondary | ICD-10-CM | POA: Diagnosis not present

## 2016-04-21 LAB — HEPATIC FUNCTION PANEL
ALBUMIN: 4.5 g/dL (ref 3.5–5.2)
ALK PHOS: 65 U/L (ref 39–117)
ALT: 25 U/L (ref 0–35)
AST: 24 U/L (ref 0–37)
BILIRUBIN DIRECT: 0.3 mg/dL (ref 0.0–0.3)
TOTAL PROTEIN: 7.2 g/dL (ref 6.0–8.3)
Total Bilirubin: 1.4 mg/dL — ABNORMAL HIGH (ref 0.2–1.2)

## 2016-04-21 LAB — BASIC METABOLIC PANEL
BUN: 8 mg/dL (ref 6–23)
CO2: 27 meq/L (ref 19–32)
CREATININE: 0.73 mg/dL (ref 0.40–1.20)
Calcium: 11 mg/dL — ABNORMAL HIGH (ref 8.4–10.5)
Chloride: 106 mEq/L (ref 96–112)
GFR: 83.69 mL/min (ref >60.00)
Glucose, Bld: 111 mg/dL — ABNORMAL HIGH (ref 70–99)
Potassium: 4 mEq/L (ref 3.5–5.1)
SODIUM: 138 meq/L (ref 135–145)

## 2016-04-21 LAB — CBC WITH DIFFERENTIAL/PLATELET
BASOS ABS: 0.1 10*3/uL (ref 0.0–0.1)
Basophils Relative: 0.7 % (ref 0.0–3.0)
EOS ABS: 0.1 10*3/uL (ref 0.0–0.7)
Eosinophils Relative: 0.7 % (ref 0.0–5.0)
HCT: 44.1 % (ref 36.0–46.0)
Hemoglobin: 15.2 g/dL — ABNORMAL HIGH (ref 12.0–15.0)
LYMPHS ABS: 2.2 10*3/uL (ref 0.7–4.0)
Lymphocytes Relative: 19.8 % (ref 12.0–46.0)
MCHC: 34.5 g/dL (ref 30.0–36.0)
MCV: 90.2 fl (ref 78.0–100.0)
MONOS PCT: 5.3 % (ref 3.0–12.0)
Monocytes Absolute: 0.6 10*3/uL (ref 0.1–1.0)
NEUTROS ABS: 8 10*3/uL — AB (ref 1.4–7.7)
NEUTROS PCT: 73.5 % (ref 43.0–77.0)
PLATELETS: 246 10*3/uL (ref 150.0–400.0)
RBC: 4.89 Mil/uL (ref 3.87–5.11)
RDW: 12.8 % (ref 11.5–15.5)
WBC: 10.9 10*3/uL — ABNORMAL HIGH (ref 4.0–10.5)

## 2016-04-21 LAB — URINALYSIS, ROUTINE W REFLEX MICROSCOPIC
BILIRUBIN URINE: NEGATIVE
KETONES UR: NEGATIVE
LEUKOCYTES UA: NEGATIVE
NITRITE: NEGATIVE
PH: 7 (ref 5.0–8.0)
RBC / HPF: NONE SEEN (ref 0–?)
Specific Gravity, Urine: <=1.005 — AB (ref 1.000–1.030)
TOTAL PROTEIN, URINE-UPE24: NEGATIVE
URINE GLUCOSE: NEGATIVE
UROBILINOGEN UA: 0.2 (ref 0.0–1.0)
WBC, UA: NONE SEEN (ref 0–?)

## 2016-04-21 LAB — MICROALBUMIN / CREATININE URINE RATIO
CREATININE, U: 73.8 mg/dL
MICROALB/CREAT RATIO: 0.9 mg/g (ref 0.0–30.0)

## 2016-04-21 LAB — LIPID PANEL
CHOL/HDL RATIO: 4
Cholesterol: 116 mg/dL (ref 0–200)
HDL: 30.6 mg/dL — AB (ref >39.00)
LDL Cholesterol: 57 mg/dL (ref 0–99)
NONHDL: 85.07
Triglycerides: 138 mg/dL (ref 0.0–149.0)
VLDL: 27.6 mg/dL (ref 0.0–40.0)

## 2016-04-21 LAB — TSH: TSH: 1 u[IU]/mL (ref 0.35–4.50)

## 2016-04-21 LAB — HEMOGLOBIN A1C: HEMOGLOBIN A1C: 5.8 % (ref 4.6–6.5)

## 2016-04-21 NOTE — Patient Instructions (Addendum)
good diet and exercise significantly improve the control of your diabetes.  please let me know if you wish to be referred to a dietician.  high blood sugar is very risky to your health.  you should see an eye doctor and dentist every year.  It is very important to get all recommended vaccinations.  it is critically important to prevent falling down (keep floor areas well-lit, dry, and free of loose objects.  If you have a cane, walker, or wheelchair, you should use it, even for short trips around the house.  Wear flat-soled shoes.  Also, try not to rush). blood tests are requested for you today.  We'll let you know about the results. Please return in 1 year.

## 2016-04-21 NOTE — Progress Notes (Signed)
Subjective:    Patient ID: Brandi Walsh, female    DOB: 11/01/1946, 70 y.o.   MRN: [id number]  HPI Pt is here for regular wellness examination, and is feeling pretty well in general, and says chronic med probs are stable, except as noted below Past Medical History  Diagnosis Date  . DIABETES MELLITUS, TYPE II 10/13/2007    Qualifier: Diagnosis of  By: Charlsie Quest RMA, Lucy    . COUGH DUE TO ACE INHIBITORS 11/25/2009    Qualifier: Diagnosis of  By: Everardo All MD, Cleophas Dunker   . HYPERLIPIDEMIA 10/13/2007    Qualifier: Diagnosis of  By: Tyrone Apple, Lucy    . HYPERTENSION 10/13/2007    Qualifier: Diagnosis of  By: Tyrone Apple, Lucy    . MENOPAUSAL SYNDROME 10/13/2007    Qualifier: Diagnosis of  By: Charlsie Quest RMA, Lucy    . OSTEOPOROSIS 10/13/2007    Qualifier: Diagnosis of  By: Samara Snide      Past Surgical History  Procedure Laterality Date  . Abdominal hysterectomy    . Breast biopsy  08/2002    Social History   Social History  . Marital Status: Married    Spouse Name: N/A  . Number of Children: N/A  . Years of Education: N/A   Occupational History  . Retired     Social History Main Topics  . Smoking status: Never Smoker   . Smokeless tobacco: Not on file  . Alcohol Use: Yes  . Drug Use: No  . Sexual Activity: Not on file   Other Topics Concern  . Not on file   Social History Narrative    Current Outpatient Prescriptions on File Prior to Visit  Medication Sig Dispense Refill  . glucose blood (FREESTYLE LITE) test strip TEST ONCE DAILY 50 each 0  . losartan (COZAAR) 100 MG tablet TAKE 1 TABLET EVERY DAY 90 tablet 2  . omeprazole (PRILOSEC) 20 MG capsule Take 20 mg by mouth daily.    . rosuvastatin (CRESTOR) 40 MG tablet TAKE 1 TABLET BY MOUTH EVERY DAY 30 tablet 0   No current facility-administered medications on file prior to visit.    Allergies  Allergen Reactions  . Lovastatin     REACTION: Rash    Family History  Problem Relation Age of Onset  . Cancer Neg  Hx     BP 126/84 mmHg  Pulse 68  Temp(Src) 98.5 F (36.9 C) (Oral)  Ht  (1.575 m)  Wt 147 lb (66.679 kg)  BMI 26.88 kg/m2  SpO2 98%  Review of Systems  Constitutional: Negative for fever.  HENT: Negative for rhinorrhea.   Eyes: Negative for photophobia.  Respiratory: Negative for shortness of breath.   Cardiovascular: Negative for chest pain.  Gastrointestinal: Negative for anal bleeding.  Endocrine: Negative for cold intolerance.  Genitourinary: Negative for hematuria.  Musculoskeletal: Negative for back pain.  Skin: Negative for rash.  Allergic/Immunologic: Negative for environmental allergies.  Neurological: Negative for syncope and numbness.  Hematological: Does not bruise/bleed easily.  Psychiatric/Behavioral: The patient is not nervous/anxious.        Objective:   Physical Exam VS: see vs page GEN: no distress HEAD: head: no deformity eyes: no periorbital swelling, no proptosis external nose and ears are normal mouth: no lesion seen NECK: supple, thyroid is not enlarged CHEST WALL: no deformity LUNGS: clear to auscultation CV: reg rate and rhythm, no murmur ABD: abdomen is soft, nontender.  no hepatosplenomegaly.  not distended.  no hernia MUSCULOSKELETAL:  muscle bulk and strength are grossly normal.  no obvious joint swelling.  gait is normal and steady EXTEMITIES: no deformity.  no ulcer on the feet.  feet are of normal color and temp.  no edema.  There are bilateral ingrown toenails PULSES: dorsalis pedis intact bilat.  no carotid bruit NEURO:  cn 2-12 grossly intact.   readily moves all 4's.  sensation is intact to touch on the feet SKIN:  Normal texture and temperature.  No rash or suspicious lesion is visible.   NODES:  None palpable at the neck PSYCH: alert, well-oriented.  Does not appear anxious nor depressed.    i personally reviewed electrocardiogram tracing (today): Indication: DM Impression: normal     Assessment & Plan:  Wellness  visit today, with problems stable, except as noted.   Subjective:   Patient here for Medicare annual wellness visit and management of other chronic and acute problems.     Risk factors: advanced age    Roster of Physicians Providing Medical Care to Patient:  See "snapshot"    Activities of Daily Living: In your present state of health, do you have any difficulty performing the following activities?:  Preparing food and eating?: No  Bathing yourself: No  Getting dressed: No  Using the toilet:No  Moving around from place to place: No  In the past year have you fallen or had a near fall?:No    Home Safety: Has smoke detector and wears seat belts. No firearms.  Diet and Exercise  Current exercise habits:  Dietary issues discussed: healthy diet.  Depression Screen  Q1: Over the past two weeks, have you felt down, depressed or hopeless?no  Q2: Over the past two weeks, have you felt little interest or pleasure in doing things? no   The following portions of the patient's history were reviewed and updated as appropriate: allergies, current medications, past family history, past medical history, past social history, past surgical history and problem list.   Review of Systems  Denies hearing loss, and visual loss Objective:   Hearing: grossly normal Body mass index:  See vs page Msk: pt easily and quickly performs "get-up-and-go" from a sitting position.  Cognitive Impairment Assessment: cognition, memory and judgment appear normal.  remembers 3/3 at 5 minutes.  excellent recall.  can easily read and write a sentence.  alert and oriented x 3.     Assessment:   Medicare wellness utd on preventive parameters.     Plan:   During the course of the visit the patient was educated and counseled about appropriate screening and preventive services including:        Fall prevention   Screening mammography  Bone densitometry screening  Diabetes screening  Nutrition counseling    Vaccines / LABS Zostavax / Pneumococcal Vaccine today  Patient Instructions (the written plan) was given to the patient.

## 2016-04-22 LAB — PTH, INTACT AND CALCIUM
Calcium: 10.7 mg/dL — ABNORMAL HIGH (ref 8.4–10.5)
PTH: 71 pg/mL — ABNORMAL HIGH (ref 14–64)

## 2016-04-22 LAB — HEPATITIS C ANTIBODY: HCV AB: NEGATIVE

## 2016-05-04 ENCOUNTER — Encounter: Payer: Self-pay | Admitting: Gastroenterology

## 2016-05-08 ENCOUNTER — Other Ambulatory Visit: Payer: Self-pay | Admitting: Endocrinology

## 2016-05-14 ENCOUNTER — Other Ambulatory Visit: Payer: Self-pay | Admitting: Endocrinology

## 2016-07-08 ENCOUNTER — Encounter: Payer: Medicare Other | Admitting: Gastroenterology

## 2016-07-08 ENCOUNTER — Other Ambulatory Visit: Payer: Self-pay | Admitting: Endocrinology

## 2016-12-23 ENCOUNTER — Emergency Department (HOSPITAL_COMMUNITY): Payer: Medicare Other

## 2016-12-23 ENCOUNTER — Emergency Department (HOSPITAL_COMMUNITY)
Admission: EM | Admit: 2016-12-23 | Discharge: 2016-12-23 | Disposition: A | Discharge status: Home / Self Care | Payer: Medicare Other | Attending: Emergency Medicine | Admitting: Emergency Medicine

## 2016-12-23 ENCOUNTER — Encounter (HOSPITAL_COMMUNITY): Payer: Self-pay | Admitting: *Deleted

## 2016-12-23 DIAGNOSIS — E119 Type 2 diabetes mellitus without complications: Secondary | ICD-10-CM | POA: Diagnosis not present

## 2016-12-23 DIAGNOSIS — Z79899 Other long term (current) drug therapy: Secondary | ICD-10-CM | POA: Diagnosis not present

## 2016-12-23 DIAGNOSIS — R410 Disorientation, unspecified: Secondary | ICD-10-CM | POA: Diagnosis not present

## 2016-12-23 DIAGNOSIS — I10 Essential (primary) hypertension: Secondary | ICD-10-CM | POA: Insufficient documentation

## 2016-12-23 DIAGNOSIS — R4182 Altered mental status, unspecified: Secondary | ICD-10-CM | POA: Diagnosis present

## 2016-12-23 LAB — TSH: TSH: 0.825 u[IU]/mL (ref 0.350–4.500)

## 2016-12-23 LAB — COMPREHENSIVE METABOLIC PANEL
ALT: 25 U/L (ref 14–54)
AST: 25 U/L (ref 15–41)
Albumin: 4.1 g/dL (ref 3.5–5.0)
Alkaline Phosphatase: 61 U/L (ref 38–126)
Anion gap: 11 (ref 5–15)
BUN: 6 mg/dL (ref 6–20)
CO2: 21 mmol/L — AB (ref 22–32)
CREATININE: 0.79 mg/dL (ref 0.44–1.00)
Calcium: 10.5 mg/dL — ABNORMAL HIGH (ref 8.9–10.3)
Chloride: 100 mmol/L — ABNORMAL LOW (ref 101–111)
Glucose, Bld: 180 mg/dL — ABNORMAL HIGH (ref 65–99)
Potassium: 3.8 mmol/L (ref 3.5–5.1)
SODIUM: 132 mmol/L — AB (ref 135–145)
Total Bilirubin: 1.3 mg/dL — ABNORMAL HIGH (ref 0.3–1.2)
Total Protein: 7.2 g/dL (ref 6.5–8.1)

## 2016-12-23 LAB — CBC
HEMATOCRIT: 42.3 % (ref 36.0–46.0)
Hemoglobin: 15.2 g/dL — ABNORMAL HIGH (ref 12.0–15.0)
MCH: 32 pg (ref 26.0–34.0)
MCHC: 35.9 g/dL (ref 30.0–36.0)
MCV: 89.1 fL (ref 78.0–100.0)
PLATELETS: 286 10*3/uL (ref 150–400)
RBC: 4.75 MIL/uL (ref 3.87–5.11)
RDW: 13 % (ref 11.5–15.5)
WBC: 12.5 10*3/uL — AB (ref 4.0–10.5)

## 2016-12-23 LAB — URINALYSIS, ROUTINE W REFLEX MICROSCOPIC
Bilirubin Urine: NEGATIVE
GLUCOSE, UA: NEGATIVE mg/dL
Hgb urine dipstick: NEGATIVE
KETONES UR: NEGATIVE mg/dL
LEUKOCYTES UA: NEGATIVE
NITRITE: NEGATIVE
PH: 7 (ref 5.0–8.0)
Protein, ur: NEGATIVE mg/dL
Specific Gravity, Urine: 1.003 — ABNORMAL LOW (ref 1.005–1.030)

## 2016-12-23 LAB — CBG MONITORING, ED: Glucose-Capillary: 182 mg/dL — ABNORMAL HIGH (ref 65–99)

## 2016-12-23 NOTE — ED Notes (Signed)
Pt unable to sign due to registration still in pt chart. Pt verbalized understanding of discharge instructions and follow up care. Pt has no further questions. VSS

## 2016-12-23 NOTE — ED Notes (Signed)
Patient transported to X-ray 

## 2016-12-23 NOTE — Discharge Instructions (Signed)
All of your blood work is normal today. All of your imaging has been normal today. Your urine shows no signs of infection. Your thyroid function is pending. Please call your primary care dr today Dr. Everardo AllEllison to schedule an appointment as soon as possible. Return to the emergency department for fever, chest pain shortness of breath, abdominal pain, urinary symptoms or for any other reason.

## 2016-12-23 NOTE — ED Provider Notes (Signed)
[facility]-EMERGENCY DEPT Provider Note   CSN: [id number] Arrival date & time: 12/23/16  1136     History   Chief Complaint Chief Complaint  Patient presents with  . Altered Mental Status    HPI Brandi Walsh is a 71 y.o. female.  71 year old African-American female with past medical history is significant for diabetes, hypertension that presents to the ED today with complaint of anxiety, depression, confusion onset 5 days ago. Patient states that she had anxiety during the recent snowstorm about driving. States she also has had significant increase in depression due to significant amount of family issues that have recently started. She states that she feels like she's been more confused recently. Husband at bedside states the patient has been more confused more than baseline. Patient states that she took her car about 2 days ago and while driving she became very disoriented and confused. Was not sure why she was driving her where she was going. States she was having difficulties getting back to her house. Patient also states he more confused at night. Last night patient woke her husband up in the middle the night concerned about the "storm" and wanted to make sure that her husband was okay and did not need to go to the hospital. Husband states that it was not storming outside and he was in a dead sleep without any medical complaints that time. Patient feels that this is due to anxiety and depression. She is followed by her PCP. Patient denies any infectious complaints. She denies any fever, chills, headache, vision changes, slurred speech, lightheadedness, dizziness, chest pain, shortness of breath, abdominal pain, nausea, emesis, urinary symptoms, change in bowel habits, numbness/tingling. Patient denies any SI or HI behavior. Patient denies any recent new medications.      Past Medical History:  Diagnosis Date  . COUGH DUE TO ACE INHIBITORS 11/25/2009   Qualifier: Diagnosis of  By: Everardo All  MD, Cleophas Dunker   . DIABETES MELLITUS, TYPE II 10/13/2007   Qualifier: Diagnosis of  By: Charlsie Quest RMA, Lucy    . HYPERLIPIDEMIA 10/13/2007   Qualifier: Diagnosis of  By: Tyrone Apple, Lucy    . HYPERTENSION 10/13/2007   Qualifier: Diagnosis of  By: Tyrone Apple, Lucy    . MENOPAUSAL SYNDROME 10/13/2007   Qualifier: Diagnosis of  By: Charlsie Quest RMA, Lucy    . OSTEOPOROSIS 10/13/2007   Qualifier: Diagnosis of  By: Samara Snide      Patient Active Problem List   Diagnosis Date Noted  . Routine general medical examination at a health care facility 06/13/2013  . Hyperparathyroidism, primary (HCC) 06/12/2013  . HTN (hypertension) 02/19/2012  . GERD (gastroesophageal reflux disease) 02/19/2012  . 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  . COUGH DUE TO ACE INHIBITORS 11/25/2009  . Diabetes (HCC) 10/13/2007  . Dyslipidemia 10/13/2007  . Essential hypertension 10/13/2007  . MENOPAUSAL SYNDROME 10/13/2007  . Osteoporosis 10/13/2007    Past Surgical History:  Procedure Laterality Date  . ABDOMINAL HYSTERECTOMY    . BREAST BIOPSY  08/2002    OB History    No data available       Home Medications    Prior to Admission medications   Medication Sig Start Date End Date Taking? Authorizing Provider  losartan (COZAAR) 100 MG tablet TAKE 1 TABLET EVERY DAY 05/11/16  Yes Romero Belling, MD  rosuvastatin (CRESTOR) 40 MG tablet TAKE 1 TABLET BY MOUTH EVERY DAY 07/09/16  Yes Romero Belling, MD  glucose blood (FREESTYLE LITE) test strip TEST ONCE DAILY 08/13/14  Romero BellingSean Ellison, MD  omeprazole (PRILOSEC) 20 MG capsule Take 20 mg by mouth daily.    Historical Provider, MD    Family History Family History  Problem Relation Age of Onset  . Cancer Neg Hx     Social History Social History  Substance Use Topics  . Smoking status: Never Smoker  . Smokeless tobacco: Not on file  . Alcohol use Yes     Allergies   Lovastatin   Review of Systems Review of Systems  Constitutional: Negative for chills and fever.  HENT:  Negative for congestion.   Eyes: Negative for visual disturbance.  Respiratory: Negative for cough and shortness of breath.   Cardiovascular: Negative for chest pain and palpitations.  Gastrointestinal: Negative for abdominal pain, diarrhea, nausea and vomiting.  Genitourinary: Negative for dysuria, frequency, hematuria and urgency.  Musculoskeletal: Negative for back pain, neck pain and neck stiffness.  Skin: Negative for color change.  Neurological: Negative for dizziness, syncope, weakness, light-headedness and numbness.  Psychiatric/Behavioral: Positive for behavioral problems and confusion. The patient is nervous/anxious.   All other systems reviewed and are negative.    Physical Exam Updated Vital Signs BP 164/87 (BP Location: Right Arm)   Pulse 74   Temp 99.5 F (37.5 C) (Oral)   Resp 16   Ht 5\' 1"  (1.549 m)   Wt 63.5 kg   SpO2 100%   BMI 26.45 kg/m   Physical Exam  Constitutional: She is oriented to person, place, and time. She appears well-developed and well-nourished. No distress.  HENT:  Head: Normocephalic and atraumatic.  Mouth/Throat: Oropharynx is clear and moist.  Eyes: Conjunctivae and EOM are normal. Pupils are equal, round, and reactive to light. Right eye exhibits no discharge. Left eye exhibits no discharge. No scleral icterus.  Neck: Normal range of motion. Neck supple. No thyromegaly present.  Cardiovascular: Normal rate, regular rhythm, normal heart sounds and intact distal pulses.  Exam reveals no gallop and no friction rub.   No murmur heard. Pulmonary/Chest: Effort normal and breath sounds normal. No respiratory distress. She has no wheezes.  Abdominal: Soft. Bowel sounds are normal. She exhibits no distension. There is no tenderness. There is no rebound and no guarding.  Musculoskeletal: Normal range of motion.  Moving all 4 extremities without any difficulties.  Lymphadenopathy:    She has no cervical adenopathy.  Neurological: She is alert and  oriented to person, place, and time.  The patient is alert, attentive, and oriented x 4. Speech is clear. Cranial nerve II-VII grossly intact. Negative pronator drift. Sensation intact. Strength 5/5 in all extremities. Reflexes 2+ and symmetric at biceps, triceps, knees, and ankles. Rapid alternating movement and fine finger movements intact. Romberg is absent. Posture and gait normal.  Patient ambulated to the bathroom with normal gait. GCS is 15.  Skin: Skin is warm and dry. Capillary refill takes less than 2 seconds.  Psychiatric: Her speech is normal and behavior is normal. Judgment normal. She is not actively hallucinating. Cognition and memory are normal. She exhibits a depressed mood. She expresses no homicidal and no suicidal ideation. She expresses no suicidal plans and no homicidal plans.  Nursing note and vitals reviewed.    ED Treatments / Results  Labs (all labs ordered are listed, but only abnormal results are displayed) Labs Reviewed  COMPREHENSIVE METABOLIC PANEL - Abnormal; Notable for the following:       Result Value   Sodium 132 (*)    Chloride 100 (*)    CO2 21 (*)  Glucose, Bld 180 (*)    Calcium 10.5 (*)    Total Bilirubin 1.3 (*)    All other components within normal limits  CBC - Abnormal; Notable for the following:    WBC 12.5 (*)    Hemoglobin 15.2 (*)    All other components within normal limits  URINALYSIS, ROUTINE W REFLEX MICROSCOPIC - Abnormal; Notable for the following:    Color, Urine STRAW (*)    Specific Gravity, Urine 1.003 (*)    All other components within normal limits  CBG MONITORING, ED - Abnormal; Notable for the following:    Glucose-Capillary 182 (*)    All other components within normal limits  TSH    EKG  EKG Interpretation  Date/Time:  Wednesday December 23 2016 14:07:51 EST Ventricular Rate:  74 PR Interval:    QRS Duration: 80 QT Interval:  383 QTC Calculation: 425 R Axis:   30 Text Interpretation:  Sinus rhythm  Borderline low voltage, extremity leads No significant change since last tracing Confirmed by FLOYD MD, DANIEL [id number]) on 12/23/2016 2:41:38 PM       Radiology Dg Chest 2 View  Result Date: 12/23/2016 CLINICAL DATA:  Confusion EXAM: CHEST  2 VIEW COMPARISON:  None. FINDINGS: The heart size and mediastinal contours are within normal limits. Both lungs are clear. The visualized skeletal structures are unremarkable. IMPRESSION: No active cardiopulmonary disease. Electronically Signed   By: Elige Ko   On: 12/23/2016 15:46    Procedures Procedures (including critical care time)  Medications Ordered in ED Medications - No data to display   Initial Impression / Assessment and Plan / ED Course  I have reviewed the triage vital signs and the nursing notes.  Pertinent labs & imaging results that were available during my care of the patient were reviewed by me and considered in my medical decision making (see chart for details).    Patient resents to the ED with complaints of anxiety, depression, increased confusion for the past 5 days. Patient is labs are unremarkable. Patient does have a mild leukocytosis. Patient afebrile the ED. She denies any Infectious complaints. EKG without any acute changes. Urine is unremarkable without any signs of infection. Awaiting chest x-ray. Patient is hemodynamically stable. Vital signs within normal limits. Patient is alert and oriented 4. Patient has no focal neuro deficits.Denies any headache or vision changes. Low suspicion for CVAs. Awaiting TSH given patient's history of thyroid problems. The patient continues to be without any focal neuro deficits. She has been able to ambulate with normal gait. Labs unremarkable. TSH is pending. Feel the patient is stable for discharge. Confusion could likely be due to thyroid problems, depression, anxiety, dementia. Patient with a very depressed and blunted affect in the room. Husband at bedside and patient feels stable  for discharge. Patient encouraged to call her PCP today for follow-up. Needs to follow-up TSH. Pt is hemodynamically stable, in NAD, & able to ambulate in the ED. Pt denies pain & has no complaints prior to dc. Pt is comfortable with above plan and is stable for discharge at this time. All questions were answered prior to disposition. Strict return precautions for f/u to the ED were discussed. The patient was seen and examined by Dr. Adela Lank who agrees with the above plan.   Final Clinical Impressions(s) / ED Diagnoses   Final diagnoses:  Confusion    New Prescriptions New Prescriptions   No medications on file     Rise Mu, PA-C 12/23/16 1610  Melene Plan, DO 12/23/16 1621

## 2016-12-23 NOTE — ED Triage Notes (Signed)
Per pts family- pt has had confusion for 5 days. Pt alert and oriented in triage. Pt states that she feels "cloudy in the morning". No neuro deficits in triage. Pt states that she has had weight loss and decreased appetite as well.

## 2016-12-24 ENCOUNTER — Ambulatory Visit (INDEPENDENT_AMBULATORY_CARE_PROVIDER_SITE_OTHER): Payer: Medicare Other | Admitting: Endocrinology

## 2016-12-24 ENCOUNTER — Encounter: Payer: Self-pay | Admitting: Endocrinology

## 2016-12-24 VITALS — BP 132/80 | HR 82 | Ht 62.0 in | Wt 137.0 lb

## 2016-12-24 DIAGNOSIS — R413 Other amnesia: Secondary | ICD-10-CM | POA: Diagnosis not present

## 2016-12-24 DIAGNOSIS — E119 Type 2 diabetes mellitus without complications: Secondary | ICD-10-CM | POA: Diagnosis not present

## 2016-12-24 LAB — BASIC METABOLIC PANEL
BUN: 7 mg/dL (ref 6–23)
CALCIUM: 10.7 mg/dL — AB (ref 8.4–10.5)
CO2: 24 mEq/L (ref 19–32)
Chloride: 103 mEq/L (ref 96–112)
Creatinine, Ser: 0.74 mg/dL (ref 0.40–1.20)
GFR: 82.23 mL/min (ref >60.00)
GLUCOSE: 147 mg/dL — AB (ref 70–99)
POTASSIUM: 3.8 meq/L (ref 3.5–5.1)
Sodium: 135 mEq/L (ref 135–145)

## 2016-12-24 LAB — VITAMIN B12

## 2016-12-24 LAB — HEMOGLOBIN A1C: Hgb A1c MFr Bld: 5.7 % (ref 4.6–6.5)

## 2016-12-24 MED ORDER — ESCITALOPRAM OXALATE 5 MG PO TABS: 5.0000 mg | ORAL_TABLET | Freq: Every day | ORAL | 1 refills | Status: DC

## 2016-12-24 NOTE — Patient Instructions (Signed)
blood tests are requested for you today.  We'll let you know about the results.  Let's check an MRI.  you will receive a phone call, about a day and time for an appointment.   blood tests are requested for you today.  We'll let you know about the results.

## 2016-12-24 NOTE — Progress Notes (Signed)
   Subjective:    Patient ID: Brandi GitelmanBrenda P Kipper, female    DOB: November 04, 1946, 71 y.o.   MRN: [id number]  HPI Pt states 2 weeks of moderate worsening in chronic depression, and intermittent confusion.  No pain at the head.  Denies SI and drugs.   Past Medical History:  Diagnosis Date  . COUGH DUE TO ACE INHIBITORS 11/25/2009   Qualifier: Diagnosis of  By: Everardo AllEllison MD, Cleophas DunkerSean A   . DIABETES MELLITUS, TYPE II 10/13/2007   Qualifier: Diagnosis of  By: Charlsie QuestBrand RMA, Lucy    . HYPERLIPIDEMIA 10/13/2007   Qualifier: Diagnosis of  By: Tyrone AppleBrand RMA, Lucy    . HYPERTENSION 10/13/2007   Qualifier: Diagnosis of  By: Tyrone AppleBrand RMA, Lucy    . MENOPAUSAL SYNDROME 10/13/2007   Qualifier: Diagnosis of  By: Charlsie QuestBrand RMA, Lucy    . OSTEOPOROSIS 10/13/2007   Qualifier: Diagnosis of  By: Samara SnideBrand RMA, Lucy      Past Surgical History:  Procedure Laterality Date  . ABDOMINAL HYSTERECTOMY    . BREAST BIOPSY  08/2002    Social History   Social History  . Marital status: Married    Spouse name: N/A  . Number of children: N/A  . Years of education: N/A   Occupational History  . Retired     Social History Main Topics  . Smoking status: Never Smoker  . Smokeless tobacco: Never Used  . Alcohol use Yes  . Drug use: No  . Sexual activity: Not on file   Other Topics Concern  . Not on file   Social History Narrative  . No narrative on file    Current Outpatient Prescriptions on File Prior to Visit  Medication Sig Dispense Refill  . glucose blood (FREESTYLE LITE) test strip TEST ONCE DAILY 50 each 0  . losartan (COZAAR) 100 MG tablet TAKE 1 TABLET EVERY DAY 90 tablet 2  . omeprazole (PRILOSEC) 20 MG capsule Take 20 mg by mouth daily.     No current facility-administered medications on file prior to visit.     Allergies  Allergen Reactions  . Lovastatin     REACTION: Rash    Family History  Problem Relation Age of Onset  . Cancer Neg Hx     BP 132/80   Pulse 82   Ht 5\' 2"  (1.575 m)   Wt 137 lb (62.1  kg)   SpO2 97%   BMI 25.06 kg/m    Review of Systems Denies falls and dizziness.      Objective:   Physical Exam VS: see vs page GEN: no distress HEAD: head: no deformity.  Atraumatic.  MUSCULOSKELETAL: gait is normal and steady NEURO:  cn 2-12 grossly intact.   readily moves all 4's.  sensation is intact to touch on all 4's PSYCH: alert, oriented x 3.  Gets 2/3 at 5 minutes.  Does not appear anxious nor depressed.    Lab Results  Component Value Date   HGBA1C 5.7 12/24/2016      Assessment & Plan:  Depression: possibly related to confusion.  Ref psychiatry Confusion: new, mild. DM: well-controlled.  Patient is advised the following: Patient Instructions  blood tests are requested for you today.  We'll let you know about the results.  Let's check an MRI.  you will receive a phone call, about a day and time for an appointment.   blood tests are requested for you today.  We'll let you know about the results.

## 2016-12-25 ENCOUNTER — Other Ambulatory Visit: Payer: Self-pay | Admitting: Endocrinology

## 2016-12-28 ENCOUNTER — Telehealth: Payer: Self-pay | Admitting: Endocrinology

## 2016-12-28 NOTE — Telephone Encounter (Signed)
Pt's granddaughter, Porfirio MylarCarmen, called and said that she would like to speak with you regarding the MRI and insurance authorization and also, she stated that Pt is still having 'night time episodes' even with the medication that Dr. Everardo AllEllison described. CB# [phone] (she said you may leave a VM)

## 2016-12-28 NOTE — Telephone Encounter (Signed)
I contacted Brandi Walsh and advised the MRI order has been submitted [facility] to be scheduled. I advised to contact [facility] to get further info on scheduling the MRI via voicemail. Requested a call back if she had any questions.

## 2017-01-06 ENCOUNTER — Telehealth: Payer: Self-pay | Admitting: Endocrinology

## 2017-01-06 ENCOUNTER — Ambulatory Visit
Admission: RE | Admit: 2017-01-06 | Discharge: 2017-01-06 | Disposition: A | Discharge status: Home / Self Care | Payer: Medicare Other | Source: Ambulatory Visit | Attending: Endocrinology | Admitting: Endocrinology

## 2017-01-06 DIAGNOSIS — R413 Other amnesia: Secondary | ICD-10-CM

## 2017-01-06 NOTE — Telephone Encounter (Signed)
See message and please advise, Thanks!  

## 2017-01-06 NOTE — Telephone Encounter (Signed)
[facility] calling about patient brain scan, she is not able to lay flat because of the vertigo, is there another alternative   Brandi Walsh [phone]

## 2017-01-06 NOTE — Telephone Encounter (Signed)
OK, please cancel the test.

## 2017-01-07 ENCOUNTER — Ambulatory Visit (HOSPITAL_COMMUNITY)
Admission: RE | Admit: 2017-01-07 | Discharge: 2017-01-07 | Disposition: A | Discharge status: Home / Self Care | Payer: Medicare Other | Attending: Psychiatry | Admitting: Psychiatry

## 2017-01-07 NOTE — Telephone Encounter (Signed)
Patient daughter stated since the Brain scan has been cancelled, what do she do next?  And no one has called her concerning her physiatric appt. Patient daughter stated to have them call her she will schedule the appointment Brandi MylarCarmen [phone]  Please advise

## 2017-01-08 ENCOUNTER — Telehealth: Payer: Self-pay

## 2017-01-08 NOTE — Telephone Encounter (Signed)
See message and please advise, Thanks!  

## 2017-01-08 NOTE — Telephone Encounter (Signed)
I requested a call back from the patient to further discuss.

## 2017-01-08 NOTE — Telephone Encounter (Signed)
Patient's grand daughter called to discuss Brandi Walsh. Patient has a psychiatry appointment on 01/22/2017. She stated the lexapro is not helping with the patient's depression symptoms. She wanted to know if we could adjust her lexapro medication? Also, could we reorder the patient's MRI? The day the patient originally had her MRI schedule she had severe anxiety and could not make it through the test. Patient is requesting to try this test again and if the day of the test we could provide xanax or lorazepam to help with her anxiety?  Please advise, Thanks!

## 2017-01-08 NOTE — Telephone Encounter (Signed)
Daughter wants to speak with Aundra MilletMegan about the MRI, daughter was transferred

## 2017-01-08 NOTE — Telephone Encounter (Signed)
Pt's granddaughter is returning your call, she requested to speak with you.

## 2017-01-08 NOTE — Telephone Encounter (Signed)
I contacted the patient's grand daughter and advised of message via voicemail. I advised the referral to psychiatry was placed on 12/25/2016 and submitted to [facility]. I provided the number to [facility] and advised her to call to further discuss this referral.

## 2017-01-08 NOTE — Telephone Encounter (Signed)
Next step is to see the specialist

## 2017-01-11 NOTE — Telephone Encounter (Signed)
See message, Thanks!  

## 2017-01-11 NOTE — Telephone Encounter (Signed)
I would like to refer the question about the MRI to psychiatry.  Also, I don't have a good enough handle on your diagnosis to adjust the medication.

## 2017-01-11 NOTE — Telephone Encounter (Signed)
I contacted the patient grand daughter and advised of message. She voiced understanding and will advise the patient.

## 2017-01-15 ENCOUNTER — Emergency Department (HOSPITAL_COMMUNITY)
Admission: EM | Admit: 2017-01-15 | Discharge: 2017-01-17 | Disposition: A | Discharge status: Other Acute Inpatient Hospital | Payer: Medicare Other | Attending: Emergency Medicine | Admitting: Emergency Medicine

## 2017-01-15 ENCOUNTER — Encounter (HOSPITAL_COMMUNITY): Payer: Self-pay | Admitting: Emergency Medicine

## 2017-01-15 DIAGNOSIS — F329 Major depressive disorder, single episode, unspecified: Secondary | ICD-10-CM | POA: Insufficient documentation

## 2017-01-15 DIAGNOSIS — I10 Essential (primary) hypertension: Secondary | ICD-10-CM | POA: Insufficient documentation

## 2017-01-15 DIAGNOSIS — E119 Type 2 diabetes mellitus without complications: Secondary | ICD-10-CM | POA: Diagnosis not present

## 2017-01-15 DIAGNOSIS — R45851 Suicidal ideations: Secondary | ICD-10-CM | POA: Diagnosis present

## 2017-01-15 DIAGNOSIS — Z79899 Other long term (current) drug therapy: Secondary | ICD-10-CM | POA: Insufficient documentation

## 2017-01-15 DIAGNOSIS — F32A Depression, unspecified: Secondary | ICD-10-CM

## 2017-01-15 HISTORY — DX: Depression, unspecified: F32.A

## 2017-01-15 HISTORY — DX: Major depressive disorder, single episode, unspecified: F32.9

## 2017-01-15 LAB — CBC
HCT: 41.2 % (ref 36.0–46.0)
HEMOGLOBIN: 15.5 g/dL — AB (ref 12.0–15.0)
MCH: 32.6 pg (ref 26.0–34.0)
MCHC: 37.6 g/dL — ABNORMAL HIGH (ref 30.0–36.0)
MCV: 86.6 fL (ref 78.0–100.0)
PLATELETS: 337 10*3/uL (ref 150–400)
RBC: 4.76 MIL/uL (ref 3.87–5.11)
RDW: 12.5 % (ref 11.5–15.5)
WBC: 8.2 10*3/uL (ref 4.0–10.5)

## 2017-01-15 LAB — COMPREHENSIVE METABOLIC PANEL
ALT: 41 U/L (ref 14–54)
AST: 29 U/L (ref 15–41)
Albumin: 3.9 g/dL (ref 3.5–5.0)
Alkaline Phosphatase: 58 U/L (ref 38–126)
Anion gap: 10 (ref 5–15)
BUN: 7 mg/dL (ref 6–20)
CHLORIDE: 100 mmol/L — AB (ref 101–111)
CO2: 23 mmol/L (ref 22–32)
CREATININE: 0.77 mg/dL (ref 0.44–1.00)
Calcium: 10.5 mg/dL — ABNORMAL HIGH (ref 8.9–10.3)
Glucose, Bld: 146 mg/dL — ABNORMAL HIGH (ref 65–99)
POTASSIUM: 3.6 mmol/L (ref 3.5–5.1)
SODIUM: 133 mmol/L — AB (ref 135–145)
Total Bilirubin: 2 mg/dL — ABNORMAL HIGH (ref 0.3–1.2)
Total Protein: 6.7 g/dL (ref 6.5–8.1)

## 2017-01-15 LAB — ACETAMINOPHEN LEVEL

## 2017-01-15 LAB — TSH: TSH: 0.405 u[IU]/mL (ref 0.350–4.500)

## 2017-01-15 LAB — SALICYLATE LEVEL

## 2017-01-15 LAB — ETHANOL

## 2017-01-15 MED ORDER — LOSARTAN POTASSIUM 50 MG PO TABS
100.0000 mg | ORAL_TABLET | Freq: Every day | ORAL | Status: DC
  Administered 2017-01-15 – 2017-01-17 (×3): 100 mg via ORAL
  Filled 2017-01-15 (×3): qty 2

## 2017-01-15 MED ORDER — ESCITALOPRAM OXALATE 10 MG PO TABS
5.0000 mg | ORAL_TABLET | Freq: Every day | ORAL | Status: DC
  Administered 2017-01-15 – 2017-01-17 (×3): 5 mg via ORAL
  Filled 2017-01-15 (×3): qty 1

## 2017-01-15 MED ORDER — ROSUVASTATIN CALCIUM 10 MG PO TABS
40.0000 mg | ORAL_TABLET | Freq: Every day | ORAL | Status: DC
  Administered 2017-01-15 – 2017-01-17 (×3): 40 mg via ORAL
  Filled 2017-01-15 (×3): qty 4

## 2017-01-15 MED ORDER — ONDANSETRON HCL 4 MG PO TABS: 4.0000 mg | ORAL_TABLET | Freq: Three times a day (TID) | ORAL | Status: DC | PRN

## 2017-01-15 MED ORDER — PANTOPRAZOLE SODIUM 40 MG PO TBEC
40.0000 mg | DELAYED_RELEASE_TABLET | Freq: Every day | ORAL | Status: DC
  Administered 2017-01-15 – 2017-01-17 (×3): 40 mg via ORAL
  Filled 2017-01-15 (×3): qty 1

## 2017-01-15 MED ORDER — ZOLPIDEM TARTRATE 5 MG PO TABS: 5.0000 mg | ORAL_TABLET | Freq: Every evening | ORAL | Status: DC | PRN

## 2017-01-15 MED ORDER — ACETAMINOPHEN 325 MG PO TABS: 650.0000 mg | ORAL_TABLET | ORAL | Status: DC | PRN

## 2017-01-15 NOTE — ED Provider Notes (Signed)
[facility]-EMER[facility]ENCY DEPT Provider Note   CSN: [id number] Arrival date & time: 01/15/17  0920     History   Chief Complaint Chief Complaint  Patient presents with  . Medical Clearance    HPI Brandi Walsh is a 71 y.o. female.  HPI  71 year old female presents with progressive depression for 3-4 weeks. Patient has been feeling more and more depressed and has had less energy and is eating less. This morning it was at its worse where she was refusing to get out of bed. Family brought her in here for evaluation. History is corroborated by the husband and daughter. Patient has had passive suicidal thoughts and hesitates a lot when specifically being asked however she has not indicated a plan. Patient was evaluate by her endocrinologist a couple weeks ago and supposedly had her thyroid ruled out. He is also concerned about a possible stroke but she has not had any focal weakness or numbness. She's currently on Lexapro for 2 weeks but this has not helped. She has occasionally got out of bed in the middle the night and onto the door because she thought she heard someone there but the husband states there has never been anyone there.  Past Medical History:  Diagnosis Date  . COUGH DUE TO ACE INHIBITORS 11/25/2009   Qualifier: Diagnosis of  By: Everardo AllEllison MD, Cleophas DunkerSean A   . Depression   . DIABETES MELLITUS, TYPE II 10/13/2007   Qualifier: Diagnosis of  By: Charlsie QuestBrand RMA, Lucy    . HYPERLIPIDEMIA 10/13/2007   Qualifier: Diagnosis of  By: Tyrone AppleBrand RMA, Lucy    . HYPERTENSION 10/13/2007   Qualifier: Diagnosis of  By: Tyrone AppleBrand RMA, Lucy    . MENOPAUSAL SYNDROME 10/13/2007   Qualifier: Diagnosis of  By: Charlsie QuestBrand RMA, Lucy    . OSTEOPOROSIS 10/13/2007   Qualifier: Diagnosis of  By: Samara SnideBrand RMA, Lucy      Patient Active Problem List   Diagnosis Date Noted  . Memory loss 12/24/2016  . Routine general medical examination at a health care facility 06/13/2013  . Hyperparathyroidism, primary (HCC) 06/12/2013  . HTN  (hypertension) 02/19/2012  . GERD (gastroesophageal reflux disease) 02/19/2012  . 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  . COUGH DUE TO ACE INHIBITORS 11/25/2009  . Diabetes (HCC) 10/13/2007  . Dyslipidemia 10/13/2007  . Essential hypertension 10/13/2007  . MENOPAUSAL SYNDROME 10/13/2007  . Osteoporosis 10/13/2007    Past Surgical History:  Procedure Laterality Date  . ABDOMINAL HYSTERECTOMY    . BREAST BIOPSY  08/2002    OB History    No data available       Home Medications    Prior to Admission medications   Medication Sig Start Date End Date Taking? Authorizing Provider  escitalopram (LEXAPRO) 5 MG tablet Take 1 tablet (5 mg total) by mouth daily. 12/24/16  Yes Romero BellingSean Ellison, MD  losartan (COZAAR) 100 MG tablet TAKE 1 TABLET EVERY DAY 05/11/16  Yes Romero BellingSean Ellison, MD  omeprazole (PRILOSEC) 20 MG capsule Take 20 mg by mouth daily.   Yes Historical Provider, MD  rosuvastatin (CRESTOR) 40 MG tablet TAKE 1 TABLET BY MOUTH EVERY DAY 12/25/16  Yes Romero BellingSean Ellison, MD  glucose blood (FREESTYLE LITE) test strip TEST ONCE DAILY 08/13/14   Romero BellingSean Ellison, MD    Family History Family History  Problem Relation Age of Onset  . Cancer Neg Hx     Social History Social History  Substance Use Topics  . Smoking status: Never Smoker  . Smokeless tobacco: Never Used  . Alcohol  use Yes     Allergies   Lovastatin   Review of Systems Review of Systems  Constitutional: Positive for fatigue.  Psychiatric/Behavioral: Positive for dysphoric mood, sleep disturbance and suicidal ideas.  All other systems reviewed and are negative.    Physical Exam Updated Vital Signs BP 140/80 (BP Location: Left Arm)   Pulse 67   Temp 98.2 F (36.8 C) (Oral)   Resp 17   SpO2 96%   Physical Exam  Constitutional: She is oriented to person, place, and time. She appears well-developed and well-nourished.  HENT:  Head: Normocephalic and atraumatic.  Right Ear: External ear normal.  Left Ear: External ear normal.    Nose: Nose normal.  Eyes: Right eye exhibits no discharge. Left eye exhibits no discharge.  Cardiovascular: Normal rate, regular rhythm and normal heart sounds.   Pulmonary/Chest: Effort normal and breath sounds normal.  Abdominal: Soft. There is no tenderness.  Neurological: She is alert and oriented to person, place, and time.  CN 3-12 grossly intact. 5/5 strength in all 4 extremities. Grossly normal sensation. Normal finger to nose.   Skin: Skin is warm and dry.  Psychiatric: Her speech is delayed. She is slowed. She exhibits a depressed mood. She expresses suicidal ideation. She expresses no suicidal plans.  Nursing note and vitals reviewed.    ED Treatments / Results  Labs (all labs ordered are listed, but only abnormal results are displayed) Labs Reviewed  COMPREHENSIVE METABOLIC PANEL - Abnormal; Notable for the following:       Result Value   Sodium 133 (*)    Chloride 100 (*)    Glucose, Bld 146 (*)    Calcium 10.5 (*)    Total Bilirubin 2.0 (*)    All other components within normal limits  ACETAMINOPHEN LEVEL - Abnormal; Notable for the following:    Acetaminophen (Tylenol), Serum <10 (*)    All other components within normal limits  CBC - Abnormal; Notable for the following:    Hemoglobin 15.5 (*)    MCHC 37.6 (*)    All other components within normal limits  ETHANOL  SALICYLATE LEVEL  TSH  RAPID URINE DRUG SCREEN, HOSP PERFORMED    EKG  EKG Interpretation None       Radiology No results found.  Procedures Procedures (including critical care time)  Medications Ordered in ED Medications  acetaminophen (TYLENOL) tablet 650 mg (not administered)  ondansetron (ZOFRAN) tablet 4 mg (not administered)  escitalopram (LEXAPRO) tablet 5 mg (5 mg Oral Given 01/15/17 1339)  losartan (COZAAR) tablet 100 mg (100 mg Oral Given 01/15/17 1339)  pantoprazole (PROTONIX) EC tablet 40 mg (40 mg Oral Given 01/15/17 1339)  rosuvastatin (CRESTOR) tablet 40 mg (40 mg Oral  Given 01/15/17 1339)     Initial Impression / Assessment and Plan / ED Course  I have reviewed the triage vital signs and the nursing notes.  Pertinent labs & imaging results that were available during my care of the patient were reviewed by me and considered in my medical decision making (see chart for details).    Neuro exam is non focal. I doubt cns process causing new depression. Family indicates this is subacute currently but may have had issues in past. Will send tsh based on age. Appears medically stable for psych admission  Final Clinical Impressions(s) / ED Diagnoses   Final diagnoses:  Depression, unspecified depression type    New Prescriptions New Prescriptions   No medications on file     [place]  Criss Alvine, MD 01/15/17 2014
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
## 2017-01-16 ENCOUNTER — Emergency Department (HOSPITAL_COMMUNITY): Payer: Medicare Other

## 2017-01-16 LAB — CBG MONITORING, ED
GLUCOSE-CAPILLARY: 145 mg/dL — AB (ref 65–99)
GLUCOSE-CAPILLARY: 124 mg/dL — AB (ref 65–99)
Glucose-Capillary: 123 mg/dL — ABNORMAL HIGH (ref 65–99)
Glucose-Capillary: 168 mg/dL — ABNORMAL HIGH (ref 65–99)

## 2017-01-16 LAB — URINALYSIS, ROUTINE W REFLEX MICROSCOPIC
BILIRUBIN URINE: NEGATIVE
Bacteria, UA: NONE SEEN
Glucose, UA: NEGATIVE mg/dL
Hgb urine dipstick: NEGATIVE
KETONES UR: NEGATIVE mg/dL
Nitrite: NEGATIVE
PH: 6 (ref 5.0–8.0)
PROTEIN: NEGATIVE mg/dL
RBC / HPF: NONE SEEN RBC/hpf (ref 0–5)
Specific Gravity, Urine: 1.014 (ref 1.005–1.030)

## 2017-01-16 LAB — RAPID URINE DRUG SCREEN, HOSP PERFORMED
Amphetamines: NOT DETECTED
BARBITURATES: NOT DETECTED
BENZODIAZEPINES: NOT DETECTED
Cocaine: NOT DETECTED
Opiates: NOT DETECTED
Tetrahydrocannabinol: NOT DETECTED

## 2017-01-16 MED ORDER — INSULIN ASPART 100 UNIT/ML ~~LOC~~ SOLN
0.0000 [IU] | Freq: Three times a day (TID) | SUBCUTANEOUS | Status: DC
Start: 2017-01-16 — End: 2017-01-17
  Administered 2017-01-16: 2 [IU] via SUBCUTANEOUS
  Administered 2017-01-16: 1 [IU] via SUBCUTANEOUS
  Filled 2017-01-16 (×2): qty 1

## 2017-01-16 NOTE — ED Notes (Signed)
Patient was given a snack and drink, and A Regular diet was ordered.

## 2017-01-16 NOTE — BH Assessment (Signed)
[name] Assessment Progress Note  Reassessment of pt--she states she did not sleep well overnight, and when asked if she was given any medication for sleep, she says she was "ignored' by staff. Pt admits to being depressed, but denies current SI, HI. She says her appetite has not improved. Pt believes it is "Monday", Feb 2018. When asked about experiencing hallucination, she states that she thought some people were at the door and no one was there, but she cannot say specifically when this happened, "maybe 6 mo ago". Pt appears hesitant and somewhat confused when answering questions. She states that she does not [facility] or stay in the ED, she wants to go home.

## 2017-01-16 NOTE — ED Notes (Signed)
Sitting on side of bed, alert.  

## 2017-01-16 NOTE — Progress Notes (Addendum)
Brandi Walsh with [facility] inquired if patient would prefer either locations:  [facility] [place] or  [facility] [place].  Patient's legal guardian, husband, Brandi Walsh [phone] requested that patient be placed at [facility] [place].  Brandi Walsh with [facility] call center has been notified and to Regulatory affairs officercall writer when bed becomes available. Brandi Joinerebecca [facility]-ED RN aware of plan.  Brandi Walsh, LCSWA Disposition staff 01/16/2017 1:49 PM

## 2017-01-16 NOTE — ED Provider Notes (Addendum)
Alert ambulatory no distress. Denies complaint. Results for orders placed or performed during the hospital encounter of 01/15/17  Comprehensive metabolic panel  Result Value Ref Range   Sodium 133 (L) 135 - 145 mmol/L   Potassium 3.6 3.5 - 5.1 mmol/L   Chloride 100 (L) 101 - 111 mmol/L   CO2 23 22 - 32 mmol/L   Glucose, Bld 146 (H) 65 - 99 mg/dL   BUN 7 6 - 20 mg/dL   Creatinine, Ser 1.610.77 0.44 - 1.00 mg/dL   Calcium 09.610.5 (H) 8.9 - 10.3 mg/dL   Total Protein 6.7 6.5 - 8.1 g/dL   Albumin 3.9 3.5 - 5.0 g/dL   AST 29 15 - 41 U/L   ALT 41 14 - 54 U/L   Alkaline Phosphatase 58 38 - 126 U/L   Total Bilirubin 2.0 (H) 0.3 - 1.2 mg/dL   GFR calc non Af Amer >60 >60 mL/min   GFR calc Af Amer >60 >60 mL/min   Anion gap 10 5 - 15  Ethanol  Result Value Ref Range   Alcohol, Ethyl (B) <5 <5 mg/dL  Salicylate level  Result Value Ref Range   Salicylate Lvl <7.0 2.8 - 30.0 mg/dL  Acetaminophen level  Result Value Ref Range   Acetaminophen (Tylenol), Serum <10 (L) 10 - 30 ug/mL  cbc  Result Value Ref Range   WBC 8.2 4.0 - 10.5 K/uL   RBC 4.76 3.87 - 5.11 MIL/uL   Hemoglobin 15.5 (H) 12.0 - 15.0 g/dL   HCT 04.541.2 40.936.0 - 81.146.0 %   MCV 86.6 78.0 - 100.0 fL   MCH 32.6 26.0 - 34.0 pg   MCHC 37.6 (H) 30.0 - 36.0 g/dL   RDW 91.412.5 78.211.5 - 95.615.5 %   Platelets 337 150 - 400 K/uL  TSH  Result Value Ref Range   TSH 0.405 0.350 - 4.500 uIU/mL  POC CBG, ED  Result Value Ref Range   Glucose-Capillary 123 (H) 65 - 99 mg/dL  CBG monitoring, ED  Result Value Ref Range   Glucose-Capillary 145 (H) 65 - 99 mg/dL   Dg Chest 2 View  Result Date: 12/23/2016 CLINICAL DATA:  Confusion EXAM: CHEST  2 VIEW COMPARISON:  None. FINDINGS: The heart size and mediastinal contours are within normal limits. Both lungs are clear. The visualized skeletal structures are unremarkable. IMPRESSION: No active cardiopulmonary disease. Electronically Signed   By: Elige KoHetal  Patel   On: 12/23/2016 15:46  ED ECG REPORT   Date:  01/16/2017  Rate: 58  Rhythm: sinus bradycardia  QRS Axis: normal  Intervals: normal  ST/T Wave abnormalities: normal  Conduction Disutrbances:none  Narrative Interpretation:   Old EKG Reviewed: unchanged  I have personally reviewed the EKG tracing and agree with the computerized printout as noted.   Doug SouSam Glee Lashomb, MD 01/16/17 [id number]    Doug SouSam Millette Halberstam, MD 01/16/17 [phone]

## 2017-01-16 NOTE — ED Notes (Signed)
Sitting on bed talking w/son and sitter.

## 2017-01-16 NOTE — ED Notes (Signed)
Patient CBG was 168.

## 2017-01-16 NOTE — ED Notes (Signed)
Patient tried to void, but was unable at this time.

## 2017-01-16 NOTE — ED Notes (Signed)
Pt attempted to empty bladder for urine specimen - unable

## 2017-01-16 NOTE — ED Notes (Signed)
cbg was 124 

## 2017-01-16 NOTE — ED Notes (Signed)
EKG was done for pt. Placement, purpose.

## 2017-01-16 NOTE — ED Notes (Signed)
Returned from x-ray. Sitter w/pt.  

## 2017-01-16 NOTE — ED Notes (Signed)
Dinner tray ordered.

## 2017-01-16 NOTE — ED Notes (Signed)
Talking w/sitter and son.

## 2017-01-16 NOTE — Progress Notes (Signed)
Patient's referral has been reviewed and updated at [facility].  Brandi Walsh, LCSWA Disposition staff 01/16/2017 11:31 AM

## 2017-01-16 NOTE — ED Notes (Signed)
Pt's son visiting and eating lunch w/pt.

## 2017-01-16 NOTE — ED Notes (Signed)
Transported to x-ray via w/c - sitter w/pt.

## 2017-01-16 NOTE — ED Notes (Signed)
IVC paperwork given to Dr Donnald GarrePfeiffer to complete. Pt is [facility] - request for pt to be IVC'd d/t she is not able to sign herself in and pt psychotic.

## 2017-01-16 NOTE — ED Notes (Signed)
Spouse leaving at this time.  

## 2017-01-16 NOTE — ED Notes (Signed)
TTS being performed.  

## 2017-01-16 NOTE — ED Notes (Signed)
BP rechecked manually by J Poteat, EMT - noted to have been performed by previous shift w/small BP cuff.

## 2017-01-16 NOTE — ED Notes (Signed)
Spouse visiting w/pt.  

## 2017-01-16 NOTE — ED Notes (Signed)
[place], [facility], advised [facility] may accept pt on Monday (01/18/17) - at either [facility] the 2 facilities - [place] [place] [facility] [place]. Son present and is aware - requested for her to call his father (Pt's spouse). Pt requesting same.

## 2017-01-16 NOTE — ED Notes (Signed)
Pt's spouse and female arrived to visit w/pt.

## 2017-01-16 NOTE — ED Notes (Signed)
Unable to obtain urine specimen. Pt aware and will re-attempt.

## 2017-01-16 NOTE — ED Notes (Signed)
Pt talking w/son and sitter. Pt noted to be more comfortable w/son visiting so RN has allowed him to stay.

## 2017-01-16 NOTE — ED Notes (Signed)
Pt with son, snack and drink provided.

## 2017-01-16 NOTE — Progress Notes (Addendum)
Delorise ShinerGrace at [facility] inquired if patient's EKG and Chest Xray could be faxed.  Delorise ShinerGrace informed that if results look good, patient would be offered bed and would need to be IVC'd for admission. Writer spoke with Lurena Joinerebecca [facility]-ED RN and was informed that patient's EKG and Chest Xray have been done and look good. Lurena Joinerebecca [facility]-ED RN, was notified of request for IVC. Requested clinicals have been faxed and received at [facility]. Delorise ShinerGrace at [facility] informed that they will be giving accepting information as soon as they receive pt's IVC papers. Patient's legal guardian, husband, Aurora MaskMartin Mergenthaler [phone] has been informed that patient is being IVC'd for [facility]. Patient's husband expressed gratitude and agreement with plan stating that [facility]. CSW will continue to follow up with placement options at [facility] and [facility] [facility]  [place] Abtsatia Zamyra Allensworth, LCSWA Disposition staff 01/16/2017 4:31 PM

## 2017-01-16 NOTE — ED Notes (Signed)
Pt's granddaughter present w/pt's spouse. Answered her questions re: inpt tx to satisfaction w/pt's and spouse's permission.

## 2017-01-17 LAB — CBG MONITORING, ED: Glucose-Capillary: 144 mg/dL — ABNORMAL HIGH (ref 65–99)

## 2017-01-17 NOTE — ED Notes (Signed)
Breakfast tray delivered to pt - pt declining to eat even after much encouragement.

## 2017-01-17 NOTE — ED Provider Notes (Signed)
[facility]C paperwork [facility] by [facility]. Patient is assessed. She is alert and nontoxic. She does have slightly tangential and wandering thought processes. EMR reviewed. Patient has had increasing depression and behavior changes with visual hallucinations and passive suicidal ideation having asked her daughter to shoot her. Recommendation from behavioral health's for inpatient treatment. This evening the patient is alert. She is in no respiratory distress. Heart is regular and lungs are clear. She follows commands appropriately and is up and ambulatory of her own will. IVC paperwork is completed.   Arby BarretteMarcy Leolia Vinzant, MD 01/17/17 [phone]

## 2017-01-17 NOTE — ED Notes (Signed)
Dr Ethelda ChickJacubowitz talking w/pt and family.

## 2017-01-17 NOTE — ED Notes (Signed)
Encouraging pt to eat breakfast - took few bites of bacon.

## 2017-01-17 NOTE — ED Notes (Signed)
[facility]'s need [facility].

## 2017-01-17 NOTE — ED Provider Notes (Addendum)
Alert pleasant cooperative ambulatory without difficulty   Brandi SouSam Autumne Kallio, MD 01/17/17 0907 Patient is stable for transfer to psychiatric hospital. Accepting physician Dr. Joanna HewsGodfrey    [name], MD 01/17/17 1023

## 2017-01-17 NOTE — ED Notes (Signed)
Pt up out of bed walking back and forth to the bathroom.

## 2017-01-17 NOTE — ED Notes (Signed)
Faxed copy of [facility] to [facility] Delorise ShinerGrace, RN, to call RN for report. Deputy called and advised will be en route to transport pt around 1030.

## 2017-01-17 NOTE — ED Notes (Signed)
Pt received breakfast tray 

## 2017-01-17 NOTE — ED Notes (Signed)
Pt's spouse and granddaughter visiting w/pt.

## 2017-01-21 ENCOUNTER — Telehealth: Payer: Self-pay | Admitting: Endocrinology

## 2017-01-21 NOTE — Telephone Encounter (Signed)
I contacted [facility] and advised of response.

## 2017-01-21 NOTE — Telephone Encounter (Signed)
This would need to be released thru med records

## 2017-01-21 NOTE — Telephone Encounter (Signed)
Brandi [facility]ccoyElvira [facility] with [facility] called and wanted to know if we have the results from the patient's recent MRI back from the beginning of the month.  CB# [phone]

## 2017-01-21 NOTE — Telephone Encounter (Signed)
See message and please advise, Thanks!  

## 2017-01-22 DIAGNOSIS — R600 Localized edema: Secondary | ICD-10-CM | POA: Insufficient documentation

## 2017-01-22 HISTORY — DX: Localized edema: R60.0

## 2017-01-25 ENCOUNTER — Ambulatory Visit: Payer: Self-pay | Admitting: Endocrinology

## 2017-01-30 ENCOUNTER — Other Ambulatory Visit: Payer: Self-pay | Admitting: Endocrinology

## 2017-02-10 ENCOUNTER — Ambulatory Visit (INDEPENDENT_AMBULATORY_CARE_PROVIDER_SITE_OTHER): Payer: Medicare Other | Admitting: Endocrinology

## 2017-02-10 ENCOUNTER — Encounter: Payer: Self-pay | Admitting: Endocrinology

## 2017-02-10 VITALS — BP 122/82 | HR 88 | Ht 62.0 in | Wt 138.0 lb

## 2017-02-10 DIAGNOSIS — E119 Type 2 diabetes mellitus without complications: Secondary | ICD-10-CM | POA: Diagnosis not present

## 2017-02-10 NOTE — Progress Notes (Signed)
   Subjective:    Patient ID: Brandi Walsh, female    DOB: 09/12/1946, 71 y.o.   MRN: [id number]  HPI Pt has not required any medication for DM.  since d/c from psych, she has gained a few lbs.  She says depression is much better.  dtr says cbg's are low-100's.   Past Medical History:  Diagnosis Date  . COUGH DUE TO ACE INHIBITORS 11/25/2009   Qualifier: Diagnosis of  By: Everardo AllEllison MD, Cleophas DunkerSean A   . Depression   . DIABETES MELLITUS, TYPE II 10/13/2007   Qualifier: Diagnosis of  By: Charlsie QuestBrand RMA, Lucy    . HYPERLIPIDEMIA 10/13/2007   Qualifier: Diagnosis of  By: Tyrone AppleBrand RMA, Lucy    . HYPERTENSION 10/13/2007   Qualifier: Diagnosis of  By: Tyrone AppleBrand RMA, Lucy    . MENOPAUSAL SYNDROME 10/13/2007   Qualifier: Diagnosis of  By: Charlsie QuestBrand RMA, Lucy    . OSTEOPOROSIS 10/13/2007   Qualifier: Diagnosis of  By: Samara SnideBrand RMA, Lucy      Past Surgical History:  Procedure Laterality Date  . ABDOMINAL HYSTERECTOMY    . BREAST BIOPSY  08/2002    Social History   Social History  . Marital status: Married    Spouse name: N/A  . Number of children: N/A  . Years of education: N/A   Occupational History  . Retired     Social History Main Topics  . Smoking status: Never Smoker  . Smokeless tobacco: Never Used  . Alcohol use Yes  . Drug use: No  . Sexual activity: Yes   Other Topics Concern  . Not on file   Social History Narrative  . No narrative on file    Current Outpatient Prescriptions on File Prior to Visit  Medication Sig Dispense Refill  . glucose blood (FREESTYLE LITE) test strip TEST ONCE DAILY 50 each 0  . losartan (COZAAR) 100 MG tablet TAKE 1 TABLET EVERY DAY 90 tablet 2  . omeprazole (PRILOSEC) 20 MG capsule Take 20 mg by mouth daily.    . rosuvastatin (CRESTOR) 40 MG tablet TAKE 1 TABLET BY MOUTH EVERY DAY 30 tablet 3   No current facility-administered medications on file prior to visit.     Allergies  Allergen Reactions  . Lovastatin     REACTION: Rash    Family History    Problem Relation Age of Onset  . Cancer Neg Hx     BP 122/82   Pulse 88   Ht 5\' 2"  (1.575 m)   Wt 138 lb (62.6 kg)   SpO2 98%   BMI 25.24 kg/m    Review of Systems Denies falls.      Objective:   Physical Exam VITAL SIGNS:  See vs page.  GENERAL: no distress.  Gait: normal and steady.    Lab Results  Component Value Date   HGBA1C 5.7 12/24/2016      Assessment & Plan:  Type 2 DM: she does not require any medication now. Depression: much better on rx.  Patient is advised the following: Patient Instructions  Please continue the same medications. Please come back for a follow-up appointment in 4 months.

## 2017-02-10 NOTE — Patient Instructions (Signed)
Please continue the same medications. Please come back for a follow-up appointment in 4 months. 

## 2017-03-04 ENCOUNTER — Ambulatory Visit (INDEPENDENT_AMBULATORY_CARE_PROVIDER_SITE_OTHER): Payer: Medicare Other | Admitting: Psychology

## 2017-03-04 ENCOUNTER — Encounter: Payer: Self-pay | Admitting: Psychology

## 2017-03-04 DIAGNOSIS — R413 Other amnesia: Secondary | ICD-10-CM

## 2017-03-04 DIAGNOSIS — F323 Major depressive disorder, single episode, severe with psychotic features: Secondary | ICD-10-CM

## 2017-03-04 NOTE — Progress Notes (Signed)
   Neuropsychology Note  Brandi Walsh came in today for 1 hour of neuropsychological testing with technician, Wallace Keller, BS, under the supervision of Dr. Elvis Coil. The patient did not appear overtly distressed by the testing session, per behavioral observation or via self-report to the technician. Rest breaks were offered. [facility] will return within 2 weeks for a feedback session with Dr. [name] at which time her test performances, clinical impressions and treatment recommendations will be reviewed in detail. The patient understands she can contact our office should she require our assistance before this time.  Full report to follow.

## 2017-03-04 NOTE — Progress Notes (Addendum)
NEUROPSYCHOLOGICAL INTERVIEW (CPT: [id number])  Name: Brandi Walsh Date of Birth: 1946-08-22 Date of Interview: 03/04/2017  Reason for Referral:  Brandi Walsh is a 71 y.o. female who is referred for neuropsychological evaluation by Dr. Merilyn Baba of [facility] due to concerns about altered mental status, possible cognitive decline and major depressive disorder with psychotic features. This patient is accompanied in the office by her husband and granddaughter who supplement the history.  History of Presenting Problem:  According to records reviewed, Brandi Walsh was taken today [facility] emergency department on 01/15/2017 for progressive depression, energy loss and decreased eating. She had had passive suicidal thoughts, visual hallucinations and on the morning of ED visit was refusing to get out of bed and asked her granddaughter to shoot her. Psychiatric admission was recommended and she had IVC hospitalization at [facility] from 01/17/2017-01/25/2017. 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 She was started on Aricept 5 mg, duloxetine 30 mg, olanzapine 5 mg. Over the course of her hospitalization the patient responded well to treatment with a reduction in paranoia. It was noted that on admission her MMSE was 29, but she demonstrated significant confusion and altered mental status. MRI of the brain completed on 01/24/2017 reportedly showed no acute intracranial abnormality, minimal white matter changes consistent with small vessel ischemic disease. Further outpatient cognitive testing was recommended and scheduled with our office prior to her discharge.  At today's visit, the patient reports that she understands that she was demonstrating confusion a few months prior to her hospitalization but that she was not aware of it at the time. However, she reports that she does recall being more forgetful,  misplacing and losing items, and becoming disoriented when driving.  Her family reported that she demonstrated multiple depressive and psychotic symptoms (visual hallucinations) worsening over the past year, with episodic confusion becoming more frequent. Her symptoms seemed to wax and wane to some extent over time. They reported that there were times when she seemed to herself but then episodes when she was more scatterbrained and fatigued. They reported a long history of worrying excessively about multiple different things and people, but her worrying and fears became intensified in the months leading up to her hospitalization. For example, she worries excessively about her health and had become convinced that she had ketoacidosis or a thyroid issue. Prior to her hospitalization, her granddaughter became concerned about her inability to articulate what she was thinking, frequent repetition of statements, difficulty parking the car, and avoidance of driving due to having gotten lost recently.  The patient denied prior history of depression but her granddaughter stated that she believes she may have had some underlying depression for a while. There was no prior history of visual hallucinations or other psychotic symptoms; these did not occur until the few months preceding her hospitalization. Her husband notes that she had "stopped eating for a couple years". He reported that he had to literally force her to eat, ie spoon-feed her. The patient denies this, stating that she snacked throughout the day and had no appetite when guests were around. She did start eating more during her hospitalization although it was still a small amount. She is eating significantly more since returning home and being on Zyprexa. She has gained some of the weight that she had previously lost and appears healthier to her family. Her sleeping is also improved. Additionally, has not had any hallucinations since being home.  The patient  denied any concerns about her current cognitive functioning, "unless I reflect, unless sometimes if I ruminate, the thought process seems to maybe start to just roll and roll and roll then I'll just have to get into reading or watching a TV show or something or puzzles or keeping my mind occupied".   Upon direct questioning, she and her family denied forgetfulness for recent conversations or events, repetition of questions or statements, frequent misplacement of items, difficulty concentrating, starting but not finishing tasks, distractibility, word finding difficulty, writing or spelling difficulty, comprehension difficulty, getting lost when driving.  Her granddaughter and husband report that her cognitive functioning does still wax and wane a bit but is not nearly as impaired as it was before her hospitalization. They reported that in particular her memory is quite sharp, and this was evidenced during my clinical interview with her when she spontaneously recalled specific dates and events very well.  Physically, she denies any significant complaints or pain. She does not have difficulty with walking or balance. She has not had any falls. She has no history of head injury.  There is no known family history of dementia.  The patient noted that she wonders if her history of supplement use may have something to do with the onset of her visual hallucinations. She reported that she took private gym for 2 years and quit taking it "cold [place]" in October 2017. She then started taking Mega Red and took this for one month. She feels as though that corresponds with when her hallucinations and other problems began.    Current Functioning: [name] continues to live with her husband in their own home. She has returned to driving but generally only in her neighborhood. She is not getting lost anymore. Her granddaughter set up a daytime and nighttime pillbox for her and her husband helps remind her to take her  medications. She manages her own finances independently but noted that she can get anxious and overwhelmed about this. She reported that she manages her appointments independently but her family helps remind her of them. She is cooking again and denies any difficulty with this. She is able to perform all basic ADLs.  The patient and her family described her mood as "fine" since returning home. They denied any agitation. The patient denies depressed mood. She denies suicidal ideation or intention.   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 3 adult children, 4 grands and 1 great grand Alcohol/Tobacco/Substances: Previously was a social drinker, but she no longer drinks any alcohol now that she is on psychiatric medication. Never a smoker. No SA.   Medical History: Past Medical History:  Diagnosis Date  . COUGH DUE TO ACE INHIBITORS 11/25/2009   Qualifier: Diagnosis of  By: Everardo All MD, Cleophas Dunker   . Depression   . DIABETES MELLITUS, TYPE II 10/13/2007   Qualifier: Diagnosis of  By: Charlsie Quest RMA, Lucy    . HYPERLIPIDEMIA 10/13/2007   Qualifier: Diagnosis of  By: Tyrone Apple, Lucy    . HYPERTENSION 10/13/2007   Qualifier: Diagnosis of  By: Tyrone Apple, Lucy    . MENOPAUSAL SYNDROME 10/13/2007   Qualifier: Diagnosis of  By: Charlsie Quest RMA, Lucy    . OSTEOPOROSIS 10/13/2007   Qualifier: Diagnosis of  By: Samara Snide       Current Medications:  Outpatient Encounter Prescriptions as of 03/04/2017  Medication Sig  . donepezil (ARICEPT) 5 MG tablet Take by mouth.  . DULoxetine (CYMBALTA) 30 MG capsule Take by mouth.  Marland Kitchen glucose blood (FREESTYLE LITE) test strip TEST ONCE DAILY  . losartan (COZAAR) 100 MG tablet TAKE 1 TABLET EVERY DAY  . Multiple Vitamin (THERA) TABS Take by mouth.  . OLANZapine (ZYPREXA) 5 MG tablet Take by mouth.  Marland Kitchen omeprazole (PRILOSEC)  20 MG capsule Take 20 mg by mouth daily.  . rosuvastatin (CRESTOR) 40 MG tablet TAKE 1 TABLET BY MOUTH EVERY DAY   No facility-administered encounter medications on file as of 03/04/2017.      Behavioral Observations:   Appearance: Neatly and appropriately dressed and groomed Gait: Ambulated independently, no abnormalities observed Speech: Fluent; normal rate, rhythm and volume Thought process: Generally linear but mildly disorganized Affect: Full, euthymic Interpersonal: Pleasant, appropriate, engaged   TESTING: There is medical necessity to proceed with neuropsychological assessment as the results will be used to aid in differential diagnosis and clinical decision-making and to inform specific treatment recommendations. Per the patient, her family and medical records reviewed, there was a recent change in cognitive functioning in the context of a depressive episode with psychotic features. There is a need for objective testing of cognitive functioning in order to differentiate neurologic versus psychiatric etiology of cognitive symptoms. Following our clinical interview, the patient completed a full battery of neuropsychological testing with my psychometrician under my supervision.    PLAN: The patient and her family will return to see me for a follow-up session at which time her test performances and my impressions and treatment recommendations will be reviewed in detail.   Full neuropsychological evaluation report to follow.

## 2017-03-09 ENCOUNTER — Other Ambulatory Visit: Payer: Self-pay

## 2017-03-09 MED ORDER — ROSUVASTATIN CALCIUM 40 MG PO TABS: 40.0000 mg | ORAL_TABLET | Freq: Every day | ORAL | 1 refills | Status: DC

## 2017-03-09 NOTE — Progress Notes (Signed)
NEUROPSYCHOLOGICAL EVALUATION   Name:    Brandi Walsh  Date of Birth:   11/18/1946 Date of Interview&Testing: 03/04/2017  Date of Feedback:  03/11/2017       Background Information:  Reason for Referral:  Brandi Walsh is a 71 y.o. female referred by Dr. Merilyn Baba of [facility] to assess her current level of cognitive functioning and assist in differential diagnosis. The current evaluation consisted of a review of available medical records, an interview with the patient and her husband and granddaughter, and the completion of a neuropsychological testing battery. Informed consent was obtained.  History of Presenting Problem:  According to records reviewed, Brandi Walsh was taken today [facility] emergency department on 01/15/2017 for progressive depression, energy loss and decreased eating. She had had passive suicidal thoughts, visual hallucinations and on the morning of ED visit was refusing to get out of bed and asked her granddaughter to shoot her. Psychiatric admission was recommended and she had IVC hospitalization at [facility] from 01/17/2017-01/25/2017. 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 She was started on Aricept 5 mg, duloxetine 30 mg, olanzapine 5 mg. Over the course of her hospitalization the patient responded well to treatment with a reduction in paranoia. It was noted that on admission her MMSE was 29, but she demonstrated significant confusion and altered mental status. MRI of the brain completed on 01/24/2017 reportedly showed no acute intracranial abnormality, minimal white matter changes consistent with small vessel ischemic disease. Further outpatient cognitive testing was recommended and scheduled with our office prior to her discharge.  At today's visit, the patient reports that she understands that she was demonstrating confusion a few months prior to her  hospitalization but that she was not aware of it at the time. However, she reports that she does recall being more forgetful, misplacing and losing items, and becoming disoriented when driving.  Her family reported that she demonstrated multiple depressive and psychotic symptoms (visual hallucinations) worsening over the past year, with episodic confusion becoming more frequent. Her symptoms seemed to wax and wane to some extent over time. They reported that there were times when she seemed herself but then episodes when she was more "scatterbrained" and fatigued. They reported a long history of worrying excessively about multiple different things and people, but her worrying and fears became intensified in the months leading up to her hospitalization. For example, she worries excessively about her health and had become convinced that she had ketoacidosis or a thyroid issue. Prior to her hospitalization, her granddaughter became concerned about her inability to articulate what she was thinking, frequent repetition of statements, difficulty parking the car, and avoidance of driving due to having gotten lost recently.  The patient denied prior history of depression but her granddaughter stated that she believes she may have had some underlying depression for a while. There was no prior history of visual hallucinations or other psychotic symptoms; these did not occur until the few months preceding her hospitalization. Her husband notes that she had "stopped eating for a couple years". He reported that he had to literally force her to eat, ie spoon-feed her. The patient denies this, stating that she snacked throughout the day and had no appetite when guests were around. She did start eating more during her hospitalization although it was still a small amount. She has been eating significantly more since returning home and being on Zyprexa. She has gained some of  the weight that she had previously lost and appears  healthier to her family. Her sleeping has also improved. Additionally, has not had any hallucinations since being home.  The patient denied any concerns about her current cognitive functioning, "unless I reflect, unless sometimes if I ruminate, the thought process seems to maybe start to just roll and roll and roll then I'll just have to get into reading or watching a TV show or something or puzzles or keeping my mind occupied".   Upon direct questioning, she and her family denied forgetfulness for recent conversations or events, repetition of questions or statements, frequent misplacement of items, difficulty concentrating, starting but not finishing tasks, distractibility, word finding difficulty, writing or spelling difficulty, comprehension difficulty, or getting lost when driving.  Her granddaughter and her husband report that her cognitive functioning does still wax and wane a bit but is not nearly as impaired as it was before her hospitalization. They reported that in particular her memory is quite sharp, and this was evidenced during my clinical interview with her when she spontaneously recalled specific dates and events very well.  Physically, she denies any significant complaints or pain. She does not have difficulty with walking or balance. She has not had any falls. She has no history of head injury.  There is no known family history of dementia.  The patient noted that she wonders if her history of supplement use may have something to do with the onset of her visual hallucinations. She reported that she took Prevagen for 2 years and quit taking it "cold [place]" in October 2017. She then started taking "Mega Red" for one month. She feels as though that corresponds with the onset of hallucinations and other symptoms.    Current Functioning: Brandi Walsh continues to live with her husband in their own home. She has returned to driving but only in her neighborhood. She is not getting  lost anymore. Her granddaughter set up a daytime and nighttime pillbox for her and her husband helps remind her to take her medications. She manages her own finances independently but noted that she can get anxious and overwhelmed about this. She reported that she manages her appointments independently but her family helps remind her of them. She is cooking again and denies any difficulty with this. She is able to perform all basic ADLs.  The patient and her family described her mood as "fine" since returning home. They denied any agitation. The patient denies depressed mood. She denies suicidal ideation or intention.   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 3 adult children, 4 grands and 1 great grand Alcohol/Tobacco/Substances: Previously was a social drinker, but she no longer drinks any alcohol now that she is on psychiatric medication. Never a smoker. No SA.   Medical History:  Past Medical History:  Diagnosis Date  . COUGH DUE TO ACE INHIBITORS 11/25/2009   Qualifier: Diagnosis of  By: Everardo All MD, Cleophas Dunker   . Depression   . DIABETES MELLITUS, TYPE II 10/13/2007   Qualifier: Diagnosis of  By: Charlsie Quest RMA, Lucy    . HYPERLIPIDEMIA 10/13/2007   Qualifier: Diagnosis of  By: Tyrone Apple, Lucy    . HYPERTENSION 10/13/2007   Qualifier: Diagnosis of  By: Tyrone Apple, Lucy    . MENOPAUSAL SYNDROME 10/13/2007   Qualifier: Diagnosis of  By: Charlsie Quest RMA, Lucy    . OSTEOPOROSIS  10/13/2007   Qualifier: Diagnosis of  By: Samara Snide      Current medications:  Outpatient Encounter Prescriptions as of 03/11/2017  Medication Sig  . donepezil (ARICEPT) 5 MG tablet Take by mouth.  . DULoxetine (CYMBALTA) 30 MG capsule Take by mouth.  Marland Kitchen glucose blood (FREESTYLE LITE) test strip TEST ONCE DAILY  . losartan (COZAAR) 100 MG tablet TAKE 1 TABLET EVERY DAY    . Multiple Vitamin (THERA) TABS Take by mouth.  . OLANZapine (ZYPREXA) 5 MG tablet Take by mouth.  Marland Kitchen omeprazole (PRILOSEC) 20 MG capsule Take 20 mg by mouth daily.  . rosuvastatin (CRESTOR) 40 MG tablet TAKE 1 TABLET BY MOUTH EVERY DAY   No facility-administered encounter medications on file as of 03/11/2017.      Current Examination:  Behavioral Observations:   Appearance: Neatly and appropriately dressed and groomed Gait: Ambulated independently, no abnormalities observed Speech: Fluent; normal rate, rhythm and volume Thought process: Generally goal directed but mildly disorganized Affect: Full, euthymic Interpersonal: Pleasant, appropriate, engaged Orientation: Oriented to all spheres. Accurately named the current President and his predecessor.  Tests Administered: . Test of Premorbid Functioning (TOPF) . Wechsler Adult Intelligence Scale-Fourth Edition (WAIS-IV): Similarities, Clinical cytogeneticist,  Coding and Digit Span subtests . Wechsler Memory Scale-Fourth Edition (WMS-IV) Older Adult Version (ages 42-90): Logical Memory I, II and Recognition subtests  . DIRECTV Verbal Learning Test - 2nd Edition (CVLT-2) Short Form . Repeatable Battery for the Assessment of Neuropsychological Status (RBANS) Form A:  Figure Copy and Recall subtests and Semantic Fluency subtest . Neuropsychological Assessment Battery (NAB) Language Module, Form 1: Naming Subtest . Boston Diagnostic Aphasia Examination: Complex Ideational Material subtest . Controlled Oral Word Association Test (COWAT) . Trail Making Test A and B . Clock drawing test . Geriatric Depression Scale (GDS) 15 Item . Generalized Anxiety Disorder - 7 item screener (GAD-7)  Test Results: Note: Standardized scores are presented only for use by appropriately trained professionals and to allow for any future test-retest comparison. These scores should not be interpreted without consideration of all the information that is contained in the  rest of the report. The most recent standardization samples from the test publisher or other sources were used whenever possible to derive standard scores; scores were corrected for age, gender, ethnicity and education when available.   Test Scores:  Test Name Raw Score Standardized Score Descriptor  TOPF 56/70 SS= 113 High average  WAIS-IV Subtests     Similarities 16/36 ss= 7 Low average  Block Design 24/66 ss= 8 Low end of average  Coding 33/135 ss= 7 Low average  Digit Span Forward 16/16 ss= 19 Very superior  Digit Span Backward 10/16 ss= 13 High average  WMS-IV Subtests     LM I 35/53 ss= 11 Average  LM II 11/39 ss= 7 Low average  LM II Recognition 20/23 Cum %: >75 Above average  RBANS Subtests     Figure Copy 19/20 Z= 0.7 High average  Figure Recall 7/20 Z= -1.3 Low average  Semantic Fluency 15/40 Z= -0.9 Low average  CVLT-II Scores     Trial 1 5/9 Z= -0.5 Average  Trial 4 7/9 Z= -0.5 Average  Trials 1-4 total 21/36 T= 39 Low average  SD Free Recall 5/9 Z= -1 Low average  LD Free Recall 2/9 Z= -1.5 Borderline  LD Cued Recall 3/9 Z= -2 Impaired  Recognition Discriminability 9/9 hits, 4 false positives Z= -0.5 Average  Forced Choice Recognition 9/9  WNL  NAB Naming  27/31 T= 36 Borderline  BDAE Complex Ideational Material 11/12  WNL  COWAT-FAS 25 T= 36 Borderline  COWAT-Animals 15 T= 42 Low average  Trail Making Test A 74" 0 errors T= 28 Impaired  Trail Making Test B 124" 0 errors T= 48 Average  Clock Drawing   Correct time placement, reduced planning/organization  GDS-15 0/15  WNL   GAD-7 0/21  WNL       Description of Test Results:  Premorbid verbal intellectual abilities were estimated to have been within the high average range based on a test of word reading. Psychomotor processing speed was low average. Auditory attention and working memory were above average (high average to very superior). Visual-spatial construction was average to high average. Language  abilities were somewhat variable. Specifically, confrontation naming was borderline impaired (but improved with phonemic cueing), and semantic verbal fluency was low average. Auditory comprehension of complex ideational material was within normal limits. With regard to verbal memory, encoding and acquisition of non-contextual information (i.e., word list) was low average. After a brief distracter task, free recall was low average (5/9 items recalled). After a delay, free recall was borderline impaired (2/9 items recalled). Cued recall was impaired (3/9 items recalled). Performance on a yes/no recognition task was average although she did endorse a slightly elevated number of false positive errors. On another verbal memory test, encoding and acquisition of contextual auditory information (i.e., short stories) was average. After a delay, free recall was low average. Performance on a yes/no recognition task was above average. With regard to non-verbal memory, delayed free recall of visual information was low average. Executive functioning was somewhat variable. Mental flexibility and set-shifting were average on Trails B. Verbal fluency with phonemic search restrictions was borderline impaired. Verbal abstract reasoning was low average. Performance on a clock drawing task revealed appropriate time placement but somewhat reduced organization/planning. On self-report measures, the patient denied any symptoms of depression or generalized anxiety.   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 Rule out illness anxiety disorder. Cognitive testing results indicate possible very mild global attenuation of cognitive function relative to estimated premorbid intellectual abilities. The only areas of true impairment on cognitive testing, however, were phonemic verbal fluency and memory retrieval for newly learned auditory information. Her  performance across memory tests was not indicative of a memory disorder but instead indicated probable mild frontal-subcortical dysfunction. Her testing results are not consistent with a dementia diagnosis nor ongoing delirium. Clinically, she does still appear to have mildly disorganized thought process, but it is difficult to know if this is longstanding and/or related to primary psychiatric disorder. 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 She does not appear to be experiencing significant depression at the present time, based on behavioral observations, clinical interview, and informant report. Her psychiatric medications appear to be providing considerable benefit as she is also no longer having delusions.  Overall, it appears most likely that her recent cognitive changes, acute confusion and psychosis are due to a primary psychiatric disorder rather than a neurodegenerative condition.    Recommendations/Plan: Based on the findings of the present evaluation, the following recommendations are offered:  1. Continue psychiatric medications. Follow up regularly with outpatient psychiatry.  2. Aricept is not likely indicated as she does not have dementia, and I do not see signs of an underlying neurodegenerative condition at this time.  3. If there is a decline in memory/cognitive functioning reported or observed in the future, neuropsychological re-evaluation may be useful. 4. Continue maintaining healthy diet and good sleep habits. 5. I don't see any need for the patient to stop driving locally based on her test performances. Continued oversight of medications and appointments is recommended. A family member should attend all medical/psychiatry appointments with her.   Feedback to Patient: Brandi Walsh and her granddaughter, [name],  returned for a feedback appointment on 03/11/2017 to review the results of her neuropsychological evaluation with this provider. 20 minutes face-to-face time was spent reviewing her test results, my impressions and my recommendations as detailed above.    Total time spent on this patient's case: [id number] unit for interview with psychologist; [phone] units of testing by psychometrician under psychologist's supervision; [phone] units for medical record review, scoring of neuropsychological tests, interpretation of test results, preparation of this report, and review of results to the patient by psychologist.      Thank you for your referral of Brandi Walsh. Please feel free to contact me if you have any questions or concerns regarding this report.

## 2017-03-11 ENCOUNTER — Encounter: Payer: Self-pay | Admitting: Psychology

## 2017-03-11 ENCOUNTER — Ambulatory Visit (INDEPENDENT_AMBULATORY_CARE_PROVIDER_SITE_OTHER): Payer: Medicare Other | Admitting: Psychology

## 2017-03-11 ENCOUNTER — Telehealth: Payer: Self-pay | Admitting: Endocrinology

## 2017-03-11 DIAGNOSIS — R413 Other amnesia: Secondary | ICD-10-CM | POA: Diagnosis not present

## 2017-03-11 NOTE — Telephone Encounter (Signed)
Rx for escitalopram has been discontinued. Pharmacy notified

## 2017-03-11 NOTE — Patient Instructions (Signed)
1. Continue psychiatric medications. Follow up regularly with outpatient psychiatry.  2. Aricept is not likely indicated as there is no sign of dementia on testing. You can follow up with your physician about this. 3. If there is a decline in memory/cognitive functioning reported or observed in the future, neuropsychological re-evaluation may be useful. Your current results will serve as a baseline for comparison. 4. Continue maintaining healthy diet and good sleep habits. 5. Continued oversight of medications and appointments is recommended. A family member should attend all medical/psychiatry appointments with you.

## 2017-03-11 NOTE — Telephone Encounter (Signed)
[facility] called to advise that they received a fax that was supposed to go to   [id number] [place], [place] - [address] [phone] (Phone) [phone] (Fax)   For the rx escitalopram 5 MG tablet.   [facility]. [facility] is very similar. [facility] has shredded the rx request.

## 2017-04-21 ENCOUNTER — Encounter: Payer: Self-pay | Admitting: Endocrinology

## 2017-04-21 ENCOUNTER — Ambulatory Visit (INDEPENDENT_AMBULATORY_CARE_PROVIDER_SITE_OTHER): Payer: Medicare Other | Admitting: Endocrinology

## 2017-04-21 VITALS — BP 116/78 | HR 106 | Ht 62.0 in | Wt 150.0 lb

## 2017-04-21 DIAGNOSIS — Z0000 Encounter for general adult medical examination without abnormal findings: Secondary | ICD-10-CM | POA: Diagnosis not present

## 2017-04-21 DIAGNOSIS — Z23 Encounter for immunization: Secondary | ICD-10-CM

## 2017-04-21 DIAGNOSIS — E119 Type 2 diabetes mellitus without complications: Secondary | ICD-10-CM | POA: Diagnosis not present

## 2017-04-21 LAB — POCT GLYCOSYLATED HEMOGLOBIN (HGB A1C): Hemoglobin A1C: 6.4

## 2017-04-21 NOTE — Progress Notes (Signed)
we discussed code status.  pt requests full code, but would not want to be started or maintained on artificial life-support measures if there was not a reasonable chance of recovery 

## 2017-04-21 NOTE — Progress Notes (Signed)
Subjective:    Patient ID: Brandi Walsh, female    DOB: 1946/07/07, 71 y.o.   MRN: [id number]  HPI Pt is here for regular wellness examination, and is feeling pretty well in general, and says chronic med probs are stable, except as noted below Past Medical History:  Diagnosis Date  . COUGH DUE TO ACE INHIBITORS 11/25/2009   Qualifier: Diagnosis of  By: Everardo All MD, Cleophas Dunker   . Depression   . DIABETES MELLITUS, TYPE II 10/13/2007   Qualifier: Diagnosis of  By: Charlsie Quest RMA, Lucy    . HYPERLIPIDEMIA 10/13/2007   Qualifier: Diagnosis of  By: Tyrone Apple, Lucy    . HYPERTENSION 10/13/2007   Qualifier: Diagnosis of  By: Tyrone Apple, Lucy    . MENOPAUSAL SYNDROME 10/13/2007   Qualifier: Diagnosis of  By: Charlsie Quest RMA, Lucy    . OSTEOPOROSIS 10/13/2007   Qualifier: Diagnosis of  By: Samara Snide      Past Surgical History:  Procedure Laterality Date  . ABDOMINAL HYSTERECTOMY    . BREAST BIOPSY  08/2002    Social History   Social History  . Marital status: Married    Spouse name: N/A  . Number of children: N/A  . Years of education: N/A   Occupational History  . Retired     Social History Main Topics  . Smoking status: Never Smoker  . Smokeless tobacco: Never Used  . Alcohol use No  . Drug use: No  . Sexual activity: Yes   Other Topics Concern  . Not on file   Social History Narrative  . No narrative on file    Current Outpatient Prescriptions on File Prior to Visit  Medication Sig Dispense Refill  . donepezil (ARICEPT) 5 MG tablet Take by mouth.    . DULoxetine (CYMBALTA) 30 MG capsule Take by mouth.    Marland Kitchen glucose blood (FREESTYLE LITE) test strip TEST ONCE DAILY 50 each 0  . losartan (COZAAR) 100 MG tablet TAKE 1 TABLET EVERY DAY 90 tablet 2  . Multiple Vitamin (THERA) TABS Take by mouth.    Marland Kitchen omeprazole (PRILOSEC) 20 MG capsule Take 20 mg by mouth daily.    . rosuvastatin (CRESTOR) 40 MG tablet Take 1 tablet (40 mg total) by mouth daily. 90 tablet 1  . OLANZapine  (ZYPREXA) 5 MG tablet Take by mouth.     No current facility-administered medications on file prior to visit.     Allergies  Allergen Reactions  . Lovastatin     REACTION: Rash    Family History  Problem Relation Age of Onset  . Cancer Neg Hx     BP 116/78   Pulse (!) 106   Ht 5\' 2"  (1.575 m)   Wt 150 lb (68 kg)   SpO2 93%   BMI 27.44 kg/m     Review of Systems Constitutional: Negative for fever.  HENT: positive for rhinorrhea.   Eyes: Negative for photophobia.  Respiratory: Negative for shortness of breath.   Cardiovascular: Negative for chest pain.  Gastrointestinal: Negative for anal bleeding.  Endocrine: Negative for cold intolerance.  Genitourinary: Negative for hematuria.  Musculoskeletal: Negative for back pain.  Skin: Negative for rash.  Allergic/Immunologic: positive for environmental allergies.  Neurological: Negative for syncope and numbness.   Hematological: Does not bruise/bleed easily.  Psychiatric/Behavioral: neg for insomnia.     Objective:   Physical Exam VS: see vs page GEN: no distress HEAD: head: no deformity eyes: no periorbital swelling, no proptosis  external nose and ears are normal mouth: no lesion seen NECK: supple, thyroid is not enlarged CHEST WALL: no deformity LUNGS: clear to auscultation CV: reg rate and rhythm, no murmur ABD: abdomen is soft, nontender.  no hepatosplenomegaly.  not distended.  no hernia MUSCULOSKELETAL: muscle bulk and strength are grossly normal.  no obvious joint swelling.  gait is normal and steady EXTEMITIES: no deformity.  no ulcer on the feet.  feet are of normal color and temp.  no edema.  There are bilateral ingrown toenails PULSES: dorsalis pedis intact bilat.  no carotid bruit NEURO:  cn 2-12 grossly intact.   readily moves all 4's.  sensation is intact to touch on the feet SKIN:  Normal texture and temperature.  No rash or suspicious lesion is visible.   NODES:  None palpable at the neck PSYCH:  alert, well-oriented.  Does not appear anxious nor depressed.       Assessment & Plan:  Wellness visit today, with problems stable, except as noted.  Subjective:   Patient here for Medicare annual wellness visit and management of other chronic and acute problems.     Risk factors: advanced age    Roster of Physicians Providing Medical Care to Patient:  See "snapshot"   Activities of Daily Living: In your present state of health, do you have any difficulty performing the following activities (lives with husband)?:  Preparing food and eating?: No  Bathing yourself: No  Getting dressed: No  Using the toilet:No  Moving around from place to place: No  In the past year have you fallen or had a near fall?:No    Home Safety: Has smoke detector and wears seat belts. No firearms.   Diet and Exercise  Current exercise habits: pt says good Dietary issues discussed: pt report a healthy diet   Depression Screen  Q1: Over the past two weeks, have you felt down, depressed or hopeless? Depression is much better recently Q2: Over the past two weeks, have you felt little interest or pleasure in doing things? no   The following portions of the patient's history were reviewed and updated as appropriate: allergies, current medications, past family history, past medical history, past social history, past surgical history and problem list.   Review of Systems  Denies hearing loss, and visual loss  Objective:   Vision:  [place] opthalmologist, so she declines [facility] today Hearing: grossly normal Body mass index:  See vs page Msk: pt easily and quickly performs "get-up-and-go" from a sitting position.   Cognitive Impairment Assessment: cognition, memory and judgment appear normal.  remembers 3/3 at 5 minutes.  excellent recall.  can easily read and write a sentence.  alert and oriented x 3.    Assessment:   Medicare wellness utd on preventive parameters    Plan:   During the course of the visit  the patient was educated and counseled about appropriate screening and preventive services including:       Fall prevention   Screening mammography  Bone densitometry screening  Diabetes screening  Nutrition counseling   Vaccines / LABS Zostavax / Pneumococcal Vaccine  today   Patient Instructions (the written plan) was given to the patient.

## 2017-04-21 NOTE — Patient Instructions (Addendum)
Please consider these measures for your health:  minimize alcohol.  Do not use tobacco products.  Women should have an annual mammogram from age 71.  Keep firearms safely stored.  Always use seat belts.  have working smoke alarms in your home.  See an eye doctor and dentist regularly.  Never drive under the influence of alcohol or drugs (including prescription drugs).  It is critically important to prevent falling down (keep floor areas well-lit, dry, and free of loose objects.  If you have a cane, walker, or wheelchair, you should use it, even for short trips around the house.  Wear flat-soled shoes.  Also, try not to rush).  Please come back for a follow-up appointment in 6 months.

## 2017-06-09 ENCOUNTER — Other Ambulatory Visit: Payer: Self-pay | Admitting: Endocrinology

## 2017-06-09 ENCOUNTER — Ambulatory Visit: Payer: Self-pay | Admitting: Endocrinology

## 2017-06-09 DIAGNOSIS — Z1231 Encounter for screening mammogram for malignant neoplasm of breast: Secondary | ICD-10-CM

## 2017-07-07 ENCOUNTER — Ambulatory Visit
Admission: RE | Admit: 2017-07-07 | Discharge: 2017-07-07 | Disposition: A | Discharge status: Home / Self Care | Payer: Medicare Other | Source: Ambulatory Visit | Attending: Endocrinology | Admitting: Endocrinology

## 2017-07-07 DIAGNOSIS — Z1231 Encounter for screening mammogram for malignant neoplasm of breast: Secondary | ICD-10-CM

## 2017-08-10 ENCOUNTER — Ambulatory Visit (INDEPENDENT_AMBULATORY_CARE_PROVIDER_SITE_OTHER): Payer: Medicare Other | Admitting: Endocrinology

## 2017-08-10 ENCOUNTER — Encounter: Payer: Self-pay | Admitting: Endocrinology

## 2017-08-10 VITALS — BP 140/96 | HR 105 | Wt 152.6 lb

## 2017-08-10 DIAGNOSIS — E119 Type 2 diabetes mellitus without complications: Secondary | ICD-10-CM

## 2017-08-10 LAB — POCT GLYCOSYLATED HEMOGLOBIN (HGB A1C): Hemoglobin A1C: 6

## 2017-08-10 NOTE — Progress Notes (Signed)
Subjective:    Patient ID: Brandi Walsh, female    DOB: 02-26-1946, 71 y.o.   MRN: [id number]  HPI Pt returns for f/u of diabetes mellitus:  DM type: 2 Dx'ed: 2014 Complications: none Therapy: none now GDM: never DKA: never Severe hypoglycemia: never Pancreatitis: never Pancreatic imaging: never Other: she took pioglitizone, due to weight gain.  Interval history: pt states she feels well in general.  She declines flu shot Past Medical History:  Diagnosis Date  . COUGH DUE TO ACE INHIBITORS 11/25/2009   Qualifier: Diagnosis of  By: Everardo AllEllison MD, Cleophas DunkerSean A   . Depression   . DIABETES MELLITUS, TYPE II 10/13/2007   Qualifier: Diagnosis of  By: Charlsie QuestBrand RMA, Lucy    . HYPERLIPIDEMIA 10/13/2007   Qualifier: Diagnosis of  By: Tyrone AppleBrand RMA, Lucy    . HYPERTENSION 10/13/2007   Qualifier: Diagnosis of  By: Tyrone AppleBrand RMA, Lucy    . MENOPAUSAL SYNDROME 10/13/2007   Qualifier: Diagnosis of  By: Charlsie QuestBrand RMA, Lucy    . OSTEOPOROSIS 10/13/2007   Qualifier: Diagnosis of  By: Samara SnideBrand RMA, Lucy      Past Surgical History:  Procedure Laterality Date  . ABDOMINAL HYSTERECTOMY    . BREAST BIOPSY  08/2002    Social History   Social History  . Marital status: Married    Spouse name: N/A  . Number of children: N/A  . Years of education: N/A   Occupational History  . Retired     Social History Main Topics  . Smoking status: Never Smoker  . Smokeless tobacco: Never Used  . Alcohol use No  . Drug use: No  . Sexual activity: Yes   Other Topics Concern  . Not on file   Social History Narrative  . No narrative on file    Current Outpatient Prescriptions on File Prior to Visit  Medication Sig Dispense Refill  . DULoxetine (CYMBALTA) 30 MG capsule Take by mouth.    Marland Kitchen. glucose blood (FREESTYLE LITE) test strip TEST ONCE DAILY 50 each 0  . losartan (COZAAR) 100 MG tablet TAKE 1 TABLET EVERY DAY 90 tablet 2  . Multiple Vitamin (THERA) TABS Take by mouth.    Marland Kitchen. omeprazole (PRILOSEC) 20 MG capsule  Take 20 mg by mouth daily.    . rosuvastatin (CRESTOR) 40 MG tablet Take 1 tablet (40 mg total) by mouth daily. 90 tablet 1  . OLANZapine (ZYPREXA) 5 MG tablet Take 2.5 mg by mouth.      No current facility-administered medications on file prior to visit.     Allergies  Allergen Reactions  . Lovastatin     REACTION: Rash    Family History  Problem Relation Age of Onset  . Cancer Neg Hx   . Breast cancer Neg Hx     BP (!) 140/96   Pulse (!) 105   Wt 152 lb 9.6 oz (69.2 kg)   SpO2 98%   BMI 27.91 kg/m    Review of Systems Denies sob.     Objective:   Physical Exam VITAL SIGNS:  See vs page GENERAL: no distress Pulses: foot pulses are intact bilaterally.   MSK: no deformity of the feet or ankles.  CV: no edema of the legs or ankles Skin:  no ulcer on the feet or ankles.  normal color and temp on the feet and ankles Neuro: sensation is intact to touch on the feet and ankles.    Lab Results  Component Value Date   CREATININE  0.77 01/15/2017   BUN 7 01/15/2017   NA 133 (L) 01/15/2017   K 3.6 01/15/2017   CL 100 (L) 01/15/2017   CO2 23 01/15/2017   A1c=6.0%     Assessment & Plan:  Type 2 DM: no medication is needed now HTN: Situational component.  Please continue the same medication for now.    Patient Instructions  Please continue the same medications.  Please come back for a follow-up appointment in 8 months (must be after 04/21/18)

## 2017-08-10 NOTE — Patient Instructions (Addendum)
Please continue the same medications.  Please come back for a follow-up appointment in 8 months (must be after 04/21/18)

## 2017-11-12 ENCOUNTER — Other Ambulatory Visit: Payer: Self-pay | Admitting: Endocrinology

## 2017-11-19 ENCOUNTER — Other Ambulatory Visit: Payer: Self-pay

## 2017-11-19 MED ORDER — ROSUVASTATIN CALCIUM 40 MG PO TABS: 40.0000 mg | ORAL_TABLET | Freq: Every day | ORAL | 1 refills | Status: DC

## 2018-05-09 ENCOUNTER — Ambulatory Visit: Payer: Self-pay | Admitting: Endocrinology

## 2018-05-24 ENCOUNTER — Ambulatory Visit: Payer: Medicare Other | Admitting: Endocrinology

## 2018-05-24 VITALS — BP 138/92 | HR 110 | Ht 62.0 in | Wt 157.0 lb

## 2018-05-24 DIAGNOSIS — M810 Age-related osteoporosis without current pathological fracture: Secondary | ICD-10-CM

## 2018-05-24 DIAGNOSIS — E210 Primary hyperparathyroidism: Secondary | ICD-10-CM | POA: Diagnosis not present

## 2018-05-24 DIAGNOSIS — Z0000 Encounter for general adult medical examination without abnormal findings: Secondary | ICD-10-CM

## 2018-05-24 DIAGNOSIS — E119 Type 2 diabetes mellitus without complications: Secondary | ICD-10-CM

## 2018-05-24 LAB — BASIC METABOLIC PANEL
BUN: 8 mg/dL (ref 6–23)
CALCIUM: 10.7 mg/dL — AB (ref 8.4–10.5)
CO2: 26 mEq/L (ref 19–32)
Chloride: 97 mEq/L (ref 96–112)
Creatinine, Ser: 0.69 mg/dL (ref 0.40–1.20)
GFR: 88.79 mL/min (ref >60.00)
GLUCOSE: 147 mg/dL — AB (ref 70–99)
Potassium: 4.1 mEq/L (ref 3.5–5.1)
SODIUM: 130 meq/L — AB (ref 135–145)

## 2018-05-24 LAB — CBC WITH DIFFERENTIAL/PLATELET
BASOS ABS: 0.1 10*3/uL (ref 0.0–0.1)
Basophils Relative: 0.6 % (ref 0.0–3.0)
Eosinophils Absolute: 0 10*3/uL (ref 0.0–0.7)
Eosinophils Relative: 0.4 % (ref 0.0–5.0)
HCT: 42.2 % (ref 36.0–46.0)
Hemoglobin: 15 g/dL (ref 12.0–15.0)
LYMPHS ABS: 1.8 10*3/uL (ref 0.7–4.0)
Lymphocytes Relative: 15 % (ref 12.0–46.0)
MCHC: 35.6 g/dL (ref 30.0–36.0)
MCV: 88.9 fl (ref 78.0–100.0)
MONO ABS: 0.6 10*3/uL (ref 0.1–1.0)
Monocytes Relative: 5.4 % (ref 3.0–12.0)
NEUTROS ABS: 9.3 10*3/uL — AB (ref 1.4–7.7)
NEUTROS PCT: 78.6 % — AB (ref 43.0–77.0)
PLATELETS: 305 10*3/uL (ref 150.0–400.0)
RBC: 4.75 Mil/uL (ref 3.87–5.11)
RDW: 12.8 % (ref 11.5–15.5)
WBC: 11.9 10*3/uL — ABNORMAL HIGH (ref 4.0–10.5)

## 2018-05-24 LAB — URINALYSIS, ROUTINE W REFLEX MICROSCOPIC
BILIRUBIN URINE: NEGATIVE
HGB URINE DIPSTICK: NEGATIVE
Ketones, ur: NEGATIVE
Leukocytes, UA: NEGATIVE
NITRITE: NEGATIVE
RBC / HPF: NONE SEEN (ref 0–?)
Specific Gravity, Urine: <=1.005 — AB (ref 1.000–1.030)
TOTAL PROTEIN, URINE-UPE24: NEGATIVE
Urine Glucose: NEGATIVE
Urobilinogen, UA: 0.2 (ref 0.0–1.0)
WBC, UA: NONE SEEN (ref 0–?)
pH: 7 (ref 5.0–8.0)

## 2018-05-24 LAB — MICROALBUMIN / CREATININE URINE RATIO
Creatinine,U: 37.6 mg/dL
MICROALB/CREAT RATIO: 1.8 mg/g (ref 0.0–30.0)
Microalb, Ur: 0.7 mg/dL (ref 0.0–1.9)

## 2018-05-24 LAB — POCT GLYCOSYLATED HEMOGLOBIN (HGB A1C): HEMOGLOBIN A1C: 6.3 % — AB (ref 4.0–5.6)

## 2018-05-24 LAB — LIPID PANEL
CHOLESTEROL: 100 mg/dL (ref 0–200)
HDL: 36.7 mg/dL — AB (ref >39.00)
LDL Cholesterol: 45 mg/dL (ref 0–99)
NONHDL: 63.17
TRIGLYCERIDES: 93 mg/dL (ref 0.0–149.0)
Total CHOL/HDL Ratio: 3
VLDL: 18.6 mg/dL (ref 0.0–40.0)

## 2018-05-24 LAB — TSH: TSH: 0.67 u[IU]/mL (ref 0.35–4.50)

## 2018-05-24 LAB — VITAMIN D 25 HYDROXY (VIT D DEFICIENCY, FRACTURES): VITD: 33.26 ng/mL (ref 30.00–100.00)

## 2018-05-24 LAB — HEPATIC FUNCTION PANEL
ALBUMIN: 4.5 g/dL (ref 3.5–5.2)
ALK PHOS: 79 U/L (ref 39–117)
ALT: 32 U/L (ref 0–35)
AST: 28 U/L (ref 0–37)
BILIRUBIN DIRECT: 0.2 mg/dL (ref 0.0–0.3)
TOTAL PROTEIN: 7.6 g/dL (ref 6.0–8.3)
Total Bilirubin: 1.2 mg/dL (ref 0.2–1.2)

## 2018-05-24 NOTE — Progress Notes (Signed)
Subjective:    Patient ID: Brandi Walsh, female    DOB: [birth year], 72 y.o.   MRN: [id number]  HPI Pt is here for regular wellness examination, and is feeling pretty well in general, and says chronic med probs are stable, except as noted below Past Medical History:  Diagnosis Date  . COUGH DUE TO ACE INHIBITORS 11/25/2009   Qualifier: Diagnosis of  By: Everardo All MD, Cleophas Dunker   . Depression   . DIABETES MELLITUS, TYPE II 10/13/2007   Qualifier: Diagnosis of  By: Charlsie Quest RMA, Lucy    . HYPERLIPIDEMIA 10/13/2007   Qualifier: Diagnosis of  By: Tyrone Apple, Lucy    . HYPERTENSION 10/13/2007   Qualifier: Diagnosis of  By: Tyrone Apple, Lucy    . MENOPAUSAL SYNDROME 10/13/2007   Qualifier: Diagnosis of  By: Charlsie Quest RMA, Lucy    . OSTEOPOROSIS 10/13/2007   Qualifier: Diagnosis of  By: Samara Snide      Past Surgical History:  Procedure Laterality Date  . ABDOMINAL HYSTERECTOMY    . BREAST BIOPSY  08/2002    Social History   Socioeconomic History  . Marital status: Married    Spouse name: Not on file  . Number of children: Not on file  . Years of education: Not on file  . Highest education level: Not on file  Occupational History  . Occupation: Retired   Engineer, production  . Financial resource strain: Not on file  . Food insecurity:    Worry: Not on file    Inability: Not on file  . Transportation needs:    Medical: Not on file    Non-medical: Not on file  Tobacco Use  . Smoking status: Never Smoker  . Smokeless tobacco: Never Used  Substance and Sexual Activity  . Alcohol use: No  . Drug use: No  . Sexual activity: Yes  Lifestyle  . Physical activity:    Days per week: Not on file    Minutes per session: Not on file  . Stress: Not on file  Relationships  . Social connections:    Talks on phone: Not on file    Gets together: Not on file    Attends religious service: Not on file    Active member of club or organization: Not on file    Attends meetings of clubs or  organizations: Not on file    Relationship status: Not on file  . Intimate partner violence:    Fear of current or ex partner: Not on file    Emotionally abused: Not on file    Physically abused: Not on file    Forced sexual activity: Not on file  Other Topics Concern  . Not on file  Social History Narrative  . Not on file    Current Outpatient Medications on File Prior to Visit  Medication Sig Dispense Refill  . DULoxetine (CYMBALTA) 30 MG capsule Take by mouth.    Marland Kitchen glucose blood (FREESTYLE LITE) test strip TEST ONCE DAILY 50 each 0  . losartan (COZAAR) 100 MG tablet TAKE 1 TABLET EVERY DAY 90 tablet 2  . Multiple Vitamin (THERA) TABS Take by mouth.    . OLANZapine (ZYPREXA) 5 MG tablet Take 2.5 mg by mouth.     Marland Kitchen omeprazole (PRILOSEC) 20 MG capsule Take 20 mg by mouth daily.    . rosuvastatin (CRESTOR) 40 MG tablet Take 1 tablet (40 mg total) by mouth daily. 270 tablet 1   No current facility-administered medications on  file prior to visit.     Allergies  Allergen Reactions  . Lovastatin     REACTION: Rash    Family History  Problem Relation Age of Onset  . Cancer Neg Hx   . Breast cancer Neg Hx     BP (!) 138/92 (BP Location: Left Arm, Patient Position: Sitting, Cuff Size: Normal)   Pulse (!) 110   Ht 5\' 2"  (1.575 m)   Wt 157 lb (71.2 kg)   SpO2 93%   BMI 28.72 kg/m   Review of Systems Denies fever, fatigue, diplopia, earache, chest pain, sob, back pain, cold intolerance, BRBPR, hematuria, syncope, numbness, allergy sxs, easy bruising, and rash. Depression is well-controlled.       Objective:   Physical Exam VS: see vs page GEN: no distress HEAD: head: no deformity eyes: no periorbital swelling, no proptosis external nose and ears are normal mouth: no lesion seen NECK: supple, thyroid is not enlarged CHEST WALL: no deformity LUNGS:  Clear to auscultation.  CV: reg rate and rhythm, no murmur ABD: abdomen is soft, nontender.  no hepatosplenomegaly.  not  distended.  no hernia MUSCULOSKELETAL: muscle bulk and strength are grossly normal.  no obvious joint swelling.  gait is normal and steady EXTEMITIES: no deformity.  no ulcer on the feet.  feet are of normal color and temp.  no edema PULSES: dorsalis pedis intact bilat.  no carotid bruit NEURO:  cn 2-12 grossly intact.   readily moves all 4's.  sensation is intact to touch on the feet SKIN:  Normal texture and temperature.  No rash or suspicious lesion is visible.   NODES:  None palpable at the neck PSYCH: alert, well-oriented.  Does not appear anxious nor depressed.   I personally reviewed electrocardiogram tracing (today): Indication:  Impression: NSR.  No MI.  No hypertrophy. Compared to: no significant change normal     Assessment & Plan:  Wellness visit today, with problems stable, except as noted.   Subjective:   Patient here for Medicare annual wellness visit and management of other chronic and acute problems.     Risk factors: advanced age    Roster of Physicians Providing Medical Care to Patient:  See "snapshot"   Activities of Daily Living: In your present state of health, do you have any difficulty performing the following activities?:  Preparing food and eating?: No  Bathing yourself: No  Getting dressed: No  Using the toilet:No  Moving around from place to place: No  In the past year have you fallen or had a near fall?:No    Home Safety: Has smoke detector and wears seat belts. No firearms.   Opioid Use: none   Diet and Exercise  Current exercise habits: pt says fair Dietary issues discussed: pt reports a healthy diet   Depression Screen  Q1: Over the past two weeks, have you felt down, depressed or hopeless?no  Q2: Over the past two weeks, have you felt little interest or pleasure in doing things? no   The following portions of the patient's history were reviewed and updated as appropriate: allergies, current medications, past family history, past  medical history, past social history, past surgical history and problem list.   Review of Systems  Denies hearing loss, and visual loss.   Objective:   Vision:  See [facility] done today Hearing: grossly normal Body mass index:  See vs page Msk: pt easily and quickly performs "get-up-and-go" from a sitting position.  Cognitive Impairment Assessment: cognition, memory and  judgment appear normal.  remembers 3/3 at 5 minutes.  excellent recall.  can easily read and write a sentence.  alert and oriented x 3.     Assessment:   Medicare wellness utd on preventive parameters    Plan:   During the course of the visit the patient was educated and counseled about appropriate screening and preventive services including:        Fall prevention is advised today  Screening mammography is UTD Bone densitometry screening is ordered today Diabetes screening is done today--follow a1c off medication for now.   Nutrition counseling is offered  advanced directives/end of life addressed today:  see healthcare directives hyperlink  Vaccines are updated as needed  Patient Instructions (the written plan) was given to the patient.

## 2018-05-24 NOTE — Patient Instructions (Addendum)
Please consider these measures for your health:  minimize alcohol.  Do not use tobacco products.  Have a colonoscopy at least every 10 years from age 72.  Women should have an annual mammogram from age 72.  Keep firearms safely stored.  Always use seat belts.  have working smoke alarms in your home.  See an eye doctor and dentist regularly.  Never drive under the influence of alcohol or drugs (including prescription drugs).   please let me know what your wishes would be, if artificial life support measures should become necessary.  It is critically important to prevent falling down (keep floor areas well-lit, dry, and free of loose objects.  If you have a cane, walker, or wheelchair, you should use it, even for short trips around the house.  Wear flat-soled shoes.  Also, try not to rush). blood tests are requested for you today.  We'll let you know about the results.  Please come back for a follow-up appointment in 1 year.

## 2018-05-24 NOTE — Progress Notes (Signed)
we discussed code status.  pt requests full code, but would not want to be started or maintained on artificial life-support measures if there was not a reasonable chance of recovery 

## 2018-05-26 LAB — PTH, INTACT AND CALCIUM
CALCIUM: 10.5 mg/dL — AB (ref 8.6–10.4)
PTH: 118 pg/mL — AB (ref 14–64)

## 2018-07-04 ENCOUNTER — Ambulatory Visit (INDEPENDENT_AMBULATORY_CARE_PROVIDER_SITE_OTHER)
Admission: RE | Admit: 2018-07-04 | Discharge: 2018-07-04 | Disposition: A | Discharge status: Home / Self Care | Payer: Medicare Other | Source: Ambulatory Visit | Attending: Endocrinology | Admitting: Endocrinology

## 2018-07-04 DIAGNOSIS — M810 Age-related osteoporosis without current pathological fracture: Secondary | ICD-10-CM | POA: Diagnosis not present

## 2018-07-11 ENCOUNTER — Other Ambulatory Visit: Payer: Self-pay

## 2018-08-14 ENCOUNTER — Other Ambulatory Visit: Payer: Self-pay | Admitting: Endocrinology

## 2018-08-31 ENCOUNTER — Encounter: Payer: Self-pay | Admitting: Endocrinology

## 2018-08-31 ENCOUNTER — Ambulatory Visit: Payer: Medicare Other | Admitting: Endocrinology

## 2018-08-31 VITALS — BP 126/88 | HR 104 | Ht 62.0 in | Wt 154.0 lb

## 2018-08-31 DIAGNOSIS — E119 Type 2 diabetes mellitus without complications: Secondary | ICD-10-CM

## 2018-08-31 LAB — POCT GLYCOSYLATED HEMOGLOBIN (HGB A1C): HEMOGLOBIN A1C: 6.4 % — AB (ref 4.0–5.6)

## 2018-08-31 NOTE — Progress Notes (Signed)
Subjective:    Patient ID: Brandi Walsh, female    DOB: June 05, 1946, 72 y.o.   MRN: [id number]  HPI Pt returns for f/u of diabetes mellitus: DM type: 2 Dx'ed: 2015 Complications:  Therapy: none GDM: 1973 DKA: never Severe hypoglycemia: never Pancreatitis: never Pancreatic imaging:  Other: she stopped pioglitazone, due to weight gain.   Interval history: pt states she feels well in general.   She also returns for f/u of primary hyperparathyroidism.  She has not required medication Osteoporosis was recently noted Past Medical History:  Diagnosis Date  . COUGH DUE TO ACE INHIBITORS 11/25/2009   Qualifier: Diagnosis of  By: Everardo All MD, Cleophas Dunker   . Depression   . DIABETES MELLITUS, TYPE II 10/13/2007   Qualifier: Diagnosis of  By: Charlsie Quest RMA, Lucy    . HYPERLIPIDEMIA 10/13/2007   Qualifier: Diagnosis of  By: Tyrone Apple, Lucy    . HYPERTENSION 10/13/2007   Qualifier: Diagnosis of  By: Tyrone Apple, Lucy    . MENOPAUSAL SYNDROME 10/13/2007   Qualifier: Diagnosis of  By: Charlsie Quest RMA, Lucy    . OSTEOPOROSIS 10/13/2007   Qualifier: Diagnosis of  By: Samara Snide      Past Surgical History:  Procedure Laterality Date  . ABDOMINAL HYSTERECTOMY    . BREAST BIOPSY  08/2002    Social History   Socioeconomic History  . Marital status: Married    Spouse name: Not on file  . Number of children: Not on file  . Years of education: Not on file  . Highest education level: Not on file  Occupational History  . Occupation: Retired   Engineer, production  . Financial resource strain: Not on file  . Food insecurity:    Worry: Not on file    Inability: Not on file  . Transportation needs:    Medical: Not on file    Non-medical: Not on file  Tobacco Use  . Smoking status: Never Smoker  . Smokeless tobacco: Never Used  Substance and Sexual Activity  . Alcohol use: No  . Drug use: No  . Sexual activity: Yes  Lifestyle  . Physical activity:    Days per week: Not on file    Minutes per  session: Not on file  . Stress: Not on file  Relationships  . Social connections:    Talks on phone: Not on file    Gets together: Not on file    Attends religious service: Not on file    Active member of club or organization: Not on file    Attends meetings of clubs or organizations: Not on file    Relationship status: Not on file  . Intimate partner violence:    Fear of current or ex partner: Not on file    Emotionally abused: Not on file    Physically abused: Not on file    Forced sexual activity: Not on file  Other Topics Concern  . Not on file  Social History Narrative  . Not on file    Current Outpatient Medications on File Prior to Visit  Medication Sig Dispense Refill  . glucose blood (FREESTYLE LITE) test strip TEST ONCE DAILY 50 each 0  . losartan (COZAAR) 100 MG tablet TAKE 1 TABLET EVERY DAY 90 tablet 2  . Multiple Vitamin (THERA) TABS Take by mouth.    Marland Kitchen omeprazole (PRILOSEC) 20 MG capsule Take 20 mg by mouth daily.    . rosuvastatin (CRESTOR) 40 MG tablet Take 1 tablet (40  mg total) by mouth daily. 270 tablet 1  . DULoxetine (CYMBALTA) 30 MG capsule Take by mouth.    . OLANZapine (ZYPREXA) 5 MG tablet Take 2.5 mg by mouth.      No current facility-administered medications on file prior to visit.     Allergies  Allergen Reactions  . Lovastatin     REACTION: Rash    Family History  Problem Relation Age of Onset  . Cancer Neg Hx   . Breast cancer Neg Hx     BP 126/88   Pulse (!) 104   Ht 5\' 2"  (1.575 m)   Wt 154 lb (69.9 kg)   SpO2 97%   BMI 28.17 kg/m    Review of Systems No weight change    Objective:   Physical Exam VITAL SIGNS:  See vs page GENERAL: no distress Pulses: foot pulses are intact bilaterally.   MSK: no deformity of the feet or ankles.  CV: no edema of the legs or ankles Skin:  no ulcer on the feet or ankles.  normal color and temp on the feet and ankles Neuro: sensation is intact to touch on the feet and ankles.    Lab  Results  Component Value Date   PTH 118 (H) 05/24/2018   CALCIUM 10.7 (H) 05/24/2018   CALCIUM 10.5 (H) 05/24/2018     Lab Results  Component Value Date   HGBA1C 6.4 (A) 08/31/2018    Lumbar spine L1-L4 Femoral neck (FN) 33% distal radius  T-score -0.2 RFN: -2.3 LFN: -2.2 n/a  Change in BMD from previous DXA test (%) Down 7.1% Down 5.1% n/a      Assessment & Plan:  type 2 DM: well-controlled Osteoporosis: fracture risk is below treatment thresholds Primary hyperparathyroidism: stable  Patient Instructions  No medication is needed for the diabetes now. We can continue to follow the parathyroid and osteoporosis. Please come back for a follow-up appointment in 6 months.

## 2018-08-31 NOTE — Patient Instructions (Addendum)
No medication is needed for the diabetes now. We can continue to follow the parathyroid and osteoporosis. Please come back for a follow-up appointment in 6 months.

## 2018-11-24 ENCOUNTER — Encounter: Payer: Self-pay | Admitting: Emergency Medicine

## 2018-11-24 DIAGNOSIS — F329 Major depressive disorder, single episode, unspecified: Secondary | ICD-10-CM | POA: Insufficient documentation

## 2018-11-24 DIAGNOSIS — F325 Major depressive disorder, single episode, in full remission: Secondary | ICD-10-CM | POA: Insufficient documentation

## 2018-11-24 HISTORY — DX: Major depressive disorder, single episode, unspecified: F32.9

## 2018-12-14 ENCOUNTER — Encounter: Payer: Self-pay | Admitting: Psychiatry

## 2018-12-14 ENCOUNTER — Ambulatory Visit: Payer: Medicare Other | Admitting: Psychiatry

## 2018-12-14 VITALS — Wt 144.0 lb

## 2018-12-14 DIAGNOSIS — F324 Major depressive disorder, single episode, in partial remission: Secondary | ICD-10-CM

## 2018-12-14 DIAGNOSIS — F23 Brief psychotic disorder: Secondary | ICD-10-CM | POA: Diagnosis not present

## 2018-12-14 MED ORDER — OLANZAPINE 2.5 MG PO TABS: 2.5000 mg | ORAL_TABLET | Freq: Every day | ORAL | 0 refills | Status: DC

## 2018-12-14 MED ORDER — DULOXETINE HCL 30 MG PO CPEP: 30.0000 mg | ORAL_CAPSULE | Freq: Every day | ORAL | 1 refills | Status: DC

## 2018-12-14 MED ORDER — DONEPEZIL HCL 5 MG PO TABS: 5.0000 mg | ORAL_TABLET | Freq: Every day | ORAL | 0 refills | Status: DC

## 2018-12-14 NOTE — Progress Notes (Signed)
Brandi GitelmanBrenda P Walsh [id number] 1946-03-07 73 y.o.  Subjective:   Patient ID:  Brandi GitelmanBrenda P Walsh is a 73 y.o. (DOB 1946-03-07) female.  Chief Complaint:  Chief Complaint  Patient presents with  . 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 Reports that her mood "dropped a degree or 2" after unexpected extensive dental work for 1-2 weeks. She reports that her mood has improved since that time. She reports that she experienced some anxiety with dental work. She reports that she decided at the holidays to let "the younger ones take the lead" and take on more of the responsibilities. She reports that her sleep is "wonderful." Energy is somewhat low which she attributes to age. She reports that she can "get things done" and that her motivation is adequate. She reports adequate concentration and focus. She reports that she has not noticed any recent issues with memory. Reports that she was able to drive herself to appointment without difficulty. Denies SI.   Pt reports that her husband requested that she ask about starting medication to prevent dementia. Granddaughter called early today to report that she is concerned about progression of memory loss and would like for Aricept to be re-started.   She reports that she had some wt loss in response to dental work.   Past Psychiatric Medication Trials: Olanzapine- [facility] Cymbalta Aricept Lexapro Risperdal Xanax-took for about 30 days in 2003 after the death of her sister  Review of Systems:  Review of Systems  HENT: Positive for dental problem.   Musculoskeletal: Negative for gait problem.  Neurological: Negative for tremors.       Reports occasional chin twitch  Psychiatric/Behavioral:       Please refer to HPI     Medications: I have reviewed the patient's current medications.  Current Outpatient Medications  Medication Sig Dispense Refill  . losartan (COZAAR) 100 MG tablet TAKE 1 TABLET EVERY DAY 90 tablet 2  . Multiple Vitamin (THERA) TABS Take by mouth.    . OLANZapine (ZYPREXA) 2.5 MG tablet Take 1 tablet (2.5 mg total) by mouth at bedtime. 90 tablet 0  . rosuvastatin (CRESTOR) 40 MG tablet Take 1 tablet (40 mg total) by mouth daily. 270 tablet 1  . donepezil (ARICEPT) 5 MG tablet Take 1 tablet (5 mg total) by mouth at bedtime. 90 tablet 0  . DULoxetine (CYMBALTA) 30 MG capsule Take 1 capsule (30 mg total) by mouth daily. 90 capsule 1  . glucose blood (FREESTYLE LITE) test strip TEST ONCE DAILY 50 each 0  . omeprazole (PRILOSEC) 20 MG capsule Take 20 mg by mouth daily.     No current facility-administered medications for this visit.     Medication Side Effects: Other: Possible "twitch" around chin  Allergies:  Allergies  Allergen Reactions  . Lovastatin     REACTION: Rash    Past Medical History:  Diagnosis Date  . COUGH DUE TO ACE INHIBITORS 11/25/2009   Qualifier: Diagnosis of  By: Everardo AllEllison MD, Cleophas DunkerSean A   . Depression   . DIABETES MELLITUS, TYPE II 10/13/2007   Qualifier: Diagnosis of  By: Charlsie QuestBrand RMA, Lucy    . HYPERLIPIDEMIA 10/13/2007   Qualifier: Diagnosis of  By: Tyrone AppleBrand RMA, Lucy    . HYPERTENSION 10/13/2007   Qualifier: Diagnosis of  By: Tyrone AppleBrand RMA, Lucy    .  MENOPAUSAL SYNDROME 10/13/2007   Qualifier: Diagnosis of  By: Charlsie Quest RMA, Lucy    . OSTEOPOROSIS 10/13/2007   Qualifier: Diagnosis of  By: Samara Snide      Family History  Problem Relation Age of Onset  . Anxiety disorder Mother   . Cancer Neg Hx   . Breast cancer Neg Hx     Social History   Socioeconomic History  . Marital status: Married    Spouse name: Not on file  . Number of children: Not on file  . Years of education: Not on file  . Highest education level: Not on file  Occupational History  .  Occupation: Retired   Engineer, production  . Financial resource strain: Not on file  . Food insecurity:    Worry: Not on file    Inability: Not on file  . Transportation needs:    Medical: Not on file    Non-medical: Not on file  Tobacco Use  . Smoking status: Never Smoker  . Smokeless tobacco: Never Used  Substance and Sexual Activity  . Alcohol use: No  . Drug use: No  . Sexual activity: Yes  Lifestyle  . Physical activity:    Days per week: Not on file    Minutes per session: Not on file  . Stress: Not on file  Relationships  . Social connections:    Talks on phone: Not on file    Gets together: Not on file    Attends religious service: Not on file    Active member of club or organization: Not on file    Attends meetings of clubs or organizations: Not on file    Relationship status: Not on file  . Intimate partner violence:    Fear of current or ex partner: Not on file    Emotionally abused: Not on file    Physically abused: Not on file    Forced sexual activity: Not on file  Other Topics Concern  . Not on file  Social History Narrative  . Not on file    Past Medical History, Surgical history, Social history, and Family history were reviewed and updated as appropriate.   Please see review of systems for further details on the patient's review from today.   Objective:   Physical Exam:  Wt 144 lb (65.3 kg)   BMI 26.34 kg/m   Physical Exam Constitutional:      General: She is not in acute distress.    Appearance: She is well-developed.  Musculoskeletal:        General: No deformity.  Neurological:     Mental Status: She is alert and oriented to person, place, and time.     Coordination: Coordination normal.  Psychiatric:        Mood and Affect: Mood is not anxious or depressed. Affect is not labile, blunt, angry or inappropriate.        Speech: Speech normal.        Behavior: Behavior normal.        Thought Content: Thought content normal. Thought content does  not include homicidal or suicidal ideation. Thought content does not include homicidal or suicidal plan.        Cognition and Memory: She exhibits impaired recent memory.        Judgment: Judgment normal.     Comments: Insight intact. No auditory or visual hallucinations. No delusions. Patient noted to repeat herself on occasion during exam.     Lab Review:  Component Value Date/Time   NA 130 (L) 05/24/2018 1336   K 4.1 05/24/2018 1336   CL 97 05/24/2018 1336   CO2 26 05/24/2018 1336   GLUCOSE 147 (H) 05/24/2018 1336   BUN 8 05/24/2018 1336   CREATININE 0.69 05/24/2018 1336   CALCIUM 10.7 (H) 05/24/2018 1336   CALCIUM 10.5 (H) 05/24/2018 1336   CALCIUM 10.8 (H) 02/19/2012 1129   PROT 7.6 05/24/2018 1336   ALBUMIN 4.5 05/24/2018 1336   AST 28 05/24/2018 1336   ALT 32 05/24/2018 1336   ALKPHOS 79 05/24/2018 1336   BILITOT 1.2 05/24/2018 1336   GFRNONAA >60 01/15/2017 0941   GFRAA >60 01/15/2017 0941       Component Value Date/Time   WBC 11.9 (H) 05/24/2018 1336   RBC 4.75 05/24/2018 1336   HGB 15.0 05/24/2018 1336   HGB 15.3 12/16/2010 1045   HCT 42.2 05/24/2018 1336   HCT 43.8 12/16/2010 1045   PLT 305.0 05/24/2018 1336   PLT 224 12/16/2010 1045   MCV 88.9 05/24/2018 1336   MCV 88.9 12/16/2010 1045   MCH 32.6 01/15/2017 0941   MCHC 35.6 05/24/2018 1336   RDW 12.8 05/24/2018 1336   RDW 12.5 12/16/2010 1045   LYMPHSABS 1.8 05/24/2018 1336   LYMPHSABS 2.1 12/16/2010 1045   MONOABS 0.6 05/24/2018 1336   MONOABS 0.8 12/16/2010 1045   EOSABS 0.0 05/24/2018 1336   EOSABS 0.3 12/16/2010 1045   BASOSABS 0.1 05/24/2018 1336   BASOSABS 0.1 12/16/2010 1045    No results found for: POCLITH, LITHIUM   No results found for: PHENYTOIN, PHENOBARB, VALPROATE, CBMZ   .res Assessment: Plan:   Seen for 30 minutes and greater than 50% of visit spent counseling patient regarding her husband and granddaughters concerns about possible memory issues and restarting Aricept to  prevent further progression in cognitive impairment.  Patient agrees to restart Aricept and will restart Aricept 5 mg daily.  Discussed that she is exhibiting some orofacial movements and that this could be a side effect of olanzapine.  Patient reports that these movements are not causing her distress.  Recommended initiating Aricept and once response is known to then consider another attempt at decreasing or discontinuing olanzapine to minimize risk of movement side effects.  Patient verbalizes understanding.  Patient follow-up in 3 months or sooner if clinically indicated. Major depressive disorder in partial remission, unspecified whether recurrent (HCC) - Plan: DULoxetine (CYMBALTA) 30 MG capsule  Brief psychotic disorder (HCC) - Plan: OLANZapine (ZYPREXA) 2.5 MG tablet  Please see After Visit Summary for patient specific instructions.  Future Appointments  Date Time Provider Department Center  03/03/2019  1:30 PM Romero Belling, MD [facility] None  03/15/2019  1:00 PM [name], PMHNP [facility] None    No orders of the defined types were placed in this encounter.     -------------------------------

## 2019-01-24 ENCOUNTER — Telehealth: Payer: Self-pay | Admitting: Psychiatry

## 2019-01-24 NOTE — Telephone Encounter (Signed)
Patient's  granddaughter called and said that she is very depressed. Patient's sister has noticed as well. She has had a decline in memory. In the past she had an episode that sent her to the hospital. Granddaughter is afraid she is going down that same path . Please call her with suggestions. [phone]

## 2019-01-30 ENCOUNTER — Encounter: Payer: Self-pay | Admitting: Psychiatry

## 2019-01-30 ENCOUNTER — Ambulatory Visit: Payer: Medicare Other | Admitting: Psychiatry

## 2019-01-30 VITALS — BP 121/80 | HR 83

## 2019-01-30 DIAGNOSIS — F324 Major depressive disorder, single episode, in partial remission: Secondary | ICD-10-CM

## 2019-01-30 DIAGNOSIS — F419 Anxiety disorder, unspecified: Secondary | ICD-10-CM | POA: Diagnosis not present

## 2019-01-30 MED ORDER — MIRTAZAPINE 30 MG PO TABS: 30.0000 mg | ORAL_TABLET | Freq: Every day | ORAL | 1 refills | Status: DC

## 2019-01-30 MED ORDER — DULOXETINE HCL 20 MG PO CPEP: 20.0000 mg | ORAL_CAPSULE | Freq: Every day | ORAL | 0 refills | Status: DC

## 2019-01-30 NOTE — Progress Notes (Signed)
Brandi Walsh [id number] 09/24/1946 73 y.o.  Subjective:   Patient ID:  Brandi Walsh is a 73 y.o. (DOB 26-Sep-1946) female.  Chief Complaint:  Chief Complaint  Patient presents with  . Anxiety  . Depression  . Other    Mild cognitive changes    HPI Brandi Walsh presents to the office today for follow-up of mood, anxiety, and cognitive changes.   Talks with sister in [place] daily and sister has been concerned about possible depressive s/s. Pt reports that she has noticed some sadness. She reports some sadness since dental work in December when she unexpectedly had to have multiple teeth pulled and get dentures. She reports that she continues to have some difficulty adjusting to the "shock" of dental work. She reports some anxiety and worry and that she has worry about possible world events. Also worrying about finances. Describes some possible rumination. Denies any panic attacks.   She reports that she has slowed down on her activities and reports "no energy." She reports that her motivation has also been lower. Reports limiting her activities. She reports adequate sleep and denies difficulty falling or staying asleep. She reports that her appetite has been decreased and that having dentures has affected her food choices. She denies any change in concentration or focus. She reports that she reads the newspaper and reports that she is able to recall what she has read. Denies SI.   Denies paranoia or delusions.   She reports that she sought a new PCP. Reports calcium was slightly elevated on recent lab results.   Pt reports that chin quiver she has subsided.   Daughter reports that she has noticed "a lack of energy and motivation." Daughter reports that pt will find reasons not to do things. Daughter reports at times pt has said "I don't know what to do." Reports that it is taking her longer to do things, such as getting dressed. Reports that pt is also apathetic about certain things.  Daughter reports that there has been a change in her expression and her eyes. Daughter reports that pt worries about her husband that works as a Naval architect. They report that she is uncomfortable carrying for her infant great-grandchild alone. Daughter reports that pt made a deposit on Saturday and began to panic when she did not see where it had been posted to the account and was searching for the receipt. Daughter reports that this is concerning to her since pt had similar s/s prior to hospitalization and was searching for her purse relentlessly when it was at home. Daughter reports that pt has also re-kindled a relationship with an estranged sister and that this sister is not as positive. Daughter reports that some days it takes her longer to read the paper. Daughter reports that pt will occasionally accompany her husband but only on short rides and when he does not leave early. Daughter has not noticed any delusions. They report that she has had some fixation on shoes.   Past Psychiatric Medication Trials: Lexapro Cymbalta Risperdal Olanzapine  Review of Systems:  Review of Systems  Musculoskeletal: Negative for gait problem.  Neurological: Negative for tremors.  Psychiatric/Behavioral:       Please refer to HPI    Medications: I have reviewed the patient's current medications.  Current Outpatient Medications  Medication Sig Dispense Refill  . donepezil (ARICEPT) 5 MG tablet Take 1 tablet (5 mg total) by mouth at bedtime. 90 tablet 0  . DULoxetine (CYMBALTA) 20 MG capsule  Take 1 capsule (20 mg total) by mouth daily for 14 days. 14 capsule 0  . glucose blood (FREESTYLE LITE) test strip TEST ONCE DAILY 50 each 0  . losartan (COZAAR) 100 MG tablet TAKE 1 TABLET EVERY DAY 90 tablet 2  . Multiple Vitamin (THERA) TABS Take by mouth.    . OLANZapine (ZYPREXA) 2.5 MG tablet Take 1 tablet (2.5 mg total) by mouth at bedtime. 90 tablet 0  . rosuvastatin (CRESTOR) 40 MG tablet Take 1 tablet (40 mg  total) by mouth daily. 270 tablet 1  . mirtazapine (REMERON) 30 MG tablet Take 1 tablet (30 mg total) by mouth at bedtime for 30 days. 30 tablet 1   No current facility-administered medications for this visit.     Medication Side Effects: None  Allergies:  Allergies  Allergen Reactions  . Lovastatin     REACTION: Rash    Past Medical History:  Diagnosis Date  . COUGH DUE TO ACE INHIBITORS 11/25/2009   Qualifier: Diagnosis of  By: Everardo All MD, Cleophas Dunker   . Depression   . DIABETES MELLITUS, TYPE II 10/13/2007   Qualifier: Diagnosis of  By: Charlsie Quest RMA, Lucy    . HYPERLIPIDEMIA 10/13/2007   Qualifier: Diagnosis of  By: Tyrone Apple, Lucy    . HYPERTENSION 10/13/2007   Qualifier: Diagnosis of  By: Tyrone Apple, Lucy    . MENOPAUSAL SYNDROME 10/13/2007   Qualifier: Diagnosis of  By: Charlsie Quest RMA, Lucy    . OSTEOPOROSIS 10/13/2007   Qualifier: Diagnosis of  By: Samara Snide      Family History  Problem Relation Age of Onset  . Anxiety disorder Mother   . Cancer Neg Hx   . Breast cancer Neg Hx     Social History   Socioeconomic History  . Marital status: Married    Spouse name: Not on file  . Number of children: Not on file  . Years of education: Not on file  . Highest education level: Not on file  Occupational History  . Occupation: Retired   Engineer, production  . Financial resource strain: Not on file  . Food insecurity:    Worry: Not on file    Inability: Not on file  . Transportation needs:    Medical: Not on file    Non-medical: Not on file  Tobacco Use  . Smoking status: Never Smoker  . Smokeless tobacco: Never Used  Substance and Sexual Activity  . Alcohol use: No  . Drug use: No  . Sexual activity: Yes  Lifestyle  . Physical activity:    Days per week: Not on file    Minutes per session: Not on file  . Stress: Not on file  Relationships  . Social connections:    Talks on phone: Not on file    Gets together: Not on file    Attends religious service: Not on file     Active member of club or organization: Not on file    Attends meetings of clubs or organizations: Not on file    Relationship status: Not on file  . Intimate partner violence:    Fear of current or ex partner: Not on file    Emotionally abused: Not on file    Physically abused: Not on file    Forced sexual activity: Not on file  Other Topics Concern  . Not on file  Social History Narrative  . Not on file    Past Medical History, Surgical history, Social history, and  Family history were reviewed and updated as appropriate.   Please see review of systems for further details on the patient's review from today.   Objective:   Physical Exam:  BP 121/80   Pulse 83   Physical Exam Constitutional:      General: She is not in acute distress.    Appearance: She is well-developed.  Musculoskeletal:        General: No deformity.  Neurological:     Mental Status: She is alert and oriented to person, place, and time.     Coordination: Coordination normal.  Psychiatric:        Attention and Perception: Attention and perception normal. She does not perceive auditory or visual hallucinations.        Mood and Affect: Mood is anxious and depressed. Affect is blunt. Affect is not labile, angry or inappropriate.        Speech: Speech normal.        Behavior: Behavior normal.        Thought Content: Thought content normal. Thought content does not include homicidal or suicidal ideation. Thought content does not include homicidal or suicidal plan.        Cognition and Memory: She exhibits impaired recent memory.        Judgment: Judgment normal.     Comments: Insight intact. No delusions.  AIMS=0     Lab Review:     Component Value Date/Time   NA 130 (L) 05/24/2018 1336   K 4.1 05/24/2018 1336   CL 97 05/24/2018 1336   CO2 26 05/24/2018 1336   GLUCOSE 147 (H) 05/24/2018 1336   BUN 8 05/24/2018 1336   CREATININE 0.69 05/24/2018 1336   CALCIUM 10.7 (H) 05/24/2018 1336   CALCIUM  10.5 (H) 05/24/2018 1336   CALCIUM 10.8 (H) 02/19/2012 1129   PROT 7.6 05/24/2018 1336   ALBUMIN 4.5 05/24/2018 1336   AST 28 05/24/2018 1336   ALT 32 05/24/2018 1336   ALKPHOS 79 05/24/2018 1336   BILITOT 1.2 05/24/2018 1336   GFRNONAA >60 01/15/2017 0941   GFRAA >60 01/15/2017 0941       Component Value Date/Time   WBC 11.9 (H) 05/24/2018 1336   RBC 4.75 05/24/2018 1336   HGB 15.0 05/24/2018 1336   HGB 15.3 12/16/2010 1045   HCT 42.2 05/24/2018 1336   HCT 43.8 12/16/2010 1045   PLT 305.0 05/24/2018 1336   PLT 224 12/16/2010 1045   MCV 88.9 05/24/2018 1336   MCV 88.9 12/16/2010 1045   MCH 32.6 01/15/2017 0941   MCHC 35.6 05/24/2018 1336   RDW 12.8 05/24/2018 1336   RDW 12.5 12/16/2010 1045   LYMPHSABS 1.8 05/24/2018 1336   LYMPHSABS 2.1 12/16/2010 1045   MONOABS 0.6 05/24/2018 1336   MONOABS 0.8 12/16/2010 1045   EOSABS 0.0 05/24/2018 1336   EOSABS 0.3 12/16/2010 1045   BASOSABS 0.1 05/24/2018 1336   BASOSABS 0.1 12/16/2010 1045    No results found for: POCLITH, LITHIUM   No results found for: PHENYTOIN, PHENOBARB, VALPROATE, CBMZ   .res Assessment: Plan:   Case staffed with Dr. Jennelle Human. Discussed with patient and her daughter that sodium levels have been lower since starting Cymbalta and that low sodium levels and cause fatigue and some mild confusion.  Therefore recommend changing Cymbalta to another medication that Cymbalta is not adequately controlling mood and anxiety signs and symptoms and hyponatremia could potentially worsen with dose increase.  Will decrease Cymbalta to 20 mg daily for 2 weeks  and then discontinue. Start mirtazapine 30 mg p.o. nightly for depression and anxiety since risk of hyponatremia is considered to be lower compared to many other antidepressants. Continue olanzapine 2.5 mg at bedtime for mood, anxiety, and to prevent recurrence of psychotic signs and symptoms. Continue Aricept 5 mg daily for cognition. Major depressive disorder in  partial remission, unspecified whether recurrent (HCC) - Plan: DULoxetine (CYMBALTA) 20 MG capsule, mirtazapine (REMERON) 30 MG tablet  Anxiety disorder, unspecified type - Plan: mirtazapine (REMERON) 30 MG tablet  Please see After Visit Summary for patient specific instructions.  Future Appointments  Date Time Provider Department Center  02/27/2019 11:45 AM [name], PMHNP [facility] None  03/03/2019  1:30 PM Romero Belling, MD [facility] None  03/22/2019  1:30 PM [name], PMHNP [facility] None  04/19/2019  9:00 AM [name], FNP [facility]    No orders of the defined types were placed in this encounter.     -------------------------------

## 2019-01-30 NOTE — Patient Instructions (Signed)
Start Mirtazapine (remeron) 30 mg at bedtime to help with mood and anxiety.   Decrease Cymbalta (duloxetine) to 20 mg daily for 2 weeks, then stop.   Continue Aricept and Olanzapine.

## 2019-02-10 ENCOUNTER — Telehealth: Payer: Self-pay | Admitting: Psychiatry

## 2019-02-10 DIAGNOSIS — E871 Hypo-osmolality and hyponatremia: Secondary | ICD-10-CM

## 2019-02-10 NOTE — Telephone Encounter (Signed)
Pt Granddaughter Brandi Walsh) called. Couple of weeks ago med change is not working. Pt is moving very slow sleeping more confused looks very displaced. Husband Daphine Deutscher) agrees and states it's been coming on since med change. Stated Cymbalta decrease and added Mirtazapine.   Brandi Walsh [phone] Please return call

## 2019-02-10 NOTE — Telephone Encounter (Signed)
Granddaughter reports that family is noticing pt is "moving slow" and thoughts are also slowed. Family reports that pt seems as if she is "mentally some where else." Pt now commenting her "thought processes are confusing" and that pt seems to have more awareness into recent difficulties. Family has noticed gradual worsening in cognition over time. Granddaughter reports that her footing is slow and they are considering pursuing PT referral. Granddaughter has noticed pt has had some progressive generalized weakness over time.   Family reports that she is sleeping more and starting her day later.   Granddaughter reports that pt tends to internalize concerns and has anxiety. She reports that her anxiety has gradually worsened over the time.   Granddaughter reports that pt tends to snack frequently instead of eating meals.   Discussed that patient has had low sodium levels at times and diet Cymbalta could potentially cause hyponatremia.  Discussed that hyponatremia could cause some confusion and overall weakness.  Will therefore order metabolic panel to be done at [facility] labs.  Discussed that family would be contacted with lab results.  We will continue plan for patient to stop Cymbalta 20 mg on Monday.  Advised family to contact office with any worsening signs and symptoms.

## 2019-02-12 ENCOUNTER — Other Ambulatory Visit: Payer: Self-pay | Admitting: Endocrinology

## 2019-02-12 NOTE — Telephone Encounter (Signed)
Please refill x 3 months Further refills would have to be considered by new PCP   

## 2019-02-13 ENCOUNTER — Other Ambulatory Visit: Payer: Self-pay

## 2019-02-13 ENCOUNTER — Telehealth: Payer: Self-pay | Admitting: Psychiatry

## 2019-02-13 DIAGNOSIS — F23 Brief psychotic disorder: Secondary | ICD-10-CM

## 2019-02-13 LAB — BASIC METABOLIC PANEL
BUN: 8 mg/dL (ref 7–25)
CO2: 26 mmol/L (ref 20–32)
Calcium: 11 mg/dL — ABNORMAL HIGH (ref 8.6–10.4)
Chloride: 101 mmol/L (ref 98–110)
Creat: 0.82 mg/dL (ref 0.60–0.93)
Glucose, Bld: 166 mg/dL — ABNORMAL HIGH (ref 65–99)
Potassium: 4.2 mmol/L (ref 3.5–5.3)
Sodium: 135 mmol/L (ref 135–146)

## 2019-02-13 MED ORDER — OLANZAPINE 2.5 MG PO TABS: 2.5000 mg | ORAL_TABLET | Freq: Every day | ORAL | 0 refills | Status: DC

## 2019-02-13 NOTE — Telephone Encounter (Signed)
Patient need refill on Olazapine send to [facility] on [address]

## 2019-02-13 NOTE — Telephone Encounter (Signed)
rx submitted to pharmacy.  

## 2019-02-14 ENCOUNTER — Telehealth: Payer: Self-pay | Admitting: Psychiatry

## 2019-02-14 NOTE — Telephone Encounter (Signed)
Daughter, [name], called to ask that you call her about the lab results.[phone]

## 2019-02-15 NOTE — Telephone Encounter (Signed)
Pt has office visit tomorrow, but I did speak to her granddaughter, [name]. She mentioned pt told her she's having vivid dreams but she's not mentioned that before to you. She was speaking so fast I couldn't catch everything, she did mention the worry of tardive dyskinsia as well. She said if you needed to speak to her she would be available. She also was aware of lab results. Didn't mention the depression.

## 2019-02-16 ENCOUNTER — Other Ambulatory Visit: Payer: Self-pay

## 2019-02-16 ENCOUNTER — Encounter: Payer: Self-pay | Admitting: Psychiatry

## 2019-02-16 ENCOUNTER — Ambulatory Visit (INDEPENDENT_AMBULATORY_CARE_PROVIDER_SITE_OTHER): Payer: Medicare Other | Admitting: Psychiatry

## 2019-02-16 DIAGNOSIS — F324 Major depressive disorder, single episode, in partial remission: Secondary | ICD-10-CM

## 2019-02-16 DIAGNOSIS — F419 Anxiety disorder, unspecified: Secondary | ICD-10-CM

## 2019-02-16 MED ORDER — MIRTAZAPINE 30 MG PO TABS: ORAL_TABLET | ORAL | 1 refills | Status: DC

## 2019-02-16 MED ORDER — ESCITALOPRAM OXALATE 10 MG PO TABS: ORAL_TABLET | ORAL | 1 refills | Status: DC

## 2019-02-16 NOTE — Progress Notes (Signed)
Brandi Walsh [id number] [birth year] 73 y.o.  Subjective:   Patient ID:  Brandi Walsh is a 73 y.o. (DOB 02/12/1946) female.  Chief Complaint:  Chief Complaint  Patient presents with  . Altered Mental Status  . Depression  . Anxiety    HPI Brandi Walsh presents to the office today for increased confusion, depression, and anxiety.  She is accompanied by daughter and granddaughter. Family reports some changes in cognition in the last couple of months.  Her granddaughter was interested in neuropsych testing, however previous evaluator is no longer in practice and she reports it would be several months before patient would be able to see someone else for reassessment.  The patient and her family report that there have been no significant changes since last visit to include either improved mood and anxiety or worsening cognition.  They report that the only change has been patient reporting vivid dreams since last visit.  Patient reports that she may have had "a few" vivid dreams prior to starting Remeron.   Patient reports that her mood has been sad. She reports that she has had increased anxiety with COVID 19. Family reports that pt has had increased. She denies panic s/s. Granddaughter reports that pt will shake when anxious. Pt and family report that her energy and motivation is low. Family reports that pt is not preparing meals and doing tasks she was previously doing. She reports that she typically does not have difficulty falling or staying asleep. She reports decreased appetite. Some difficulty with concentration. She reports periods of disorientation. Denies SI.   Denies AH, VH, or paranoia. Family reports that they have not noticed any signs of psychosis.   Past Psychiatric Medication Trials: Lexapro Cymbalta Risperdal Olanzapine Remeron- Vivid dreams, nightmares  Review of Systems:  Review of Systems  Gastrointestinal:       Mild constipation  Genitourinary: Negative.    Musculoskeletal: Negative for gait problem.  Neurological: Positive for tremors.       She reports "tremors when I walk... in the legs." Reports that arms tremble first thing in the morning with raising up.   Psychiatric/Behavioral:       Please refer to HPI    Medications: I have reviewed the patient's current medications.  Current Outpatient Medications  Medication Sig Dispense Refill  . losartan (COZAAR) 100 MG tablet TAKE 1 TABLET EVERY DAY 90 tablet 2  . mirtazapine (REMERON) 30 MG tablet Decrease to 15 mg at bedtime for 3-5 days, then stop 30 tablet 1  . Multiple Vitamin (THERA) TABS Take by mouth.    . OLANZapine (ZYPREXA) 2.5 MG tablet Take 1 tablet (2.5 mg total) by mouth at bedtime. 90 tablet 0  . rosuvastatin (CRESTOR) 40 MG tablet TAKE 1 TABLET BY MOUTH EVERY DAY 90 tablet 0  . escitalopram (LEXAPRO) 10 MG tablet Take 1/2 tablet daily for one week, then 1 tablet daily 30 tablet 1  . glucose blood (FREESTYLE LITE) test strip TEST ONCE DAILY 50 each 0   No current facility-administered medications for this visit.     Medication Side Effects: Other: Vivid dreams and nightmares  Allergies:  Allergies  Allergen Reactions  . Lovastatin     REACTION: Rash    Past Medical History:  Diagnosis Date  . COUGH DUE TO ACE INHIBITORS 11/25/2009   Qualifier: Diagnosis of  By: Everardo All MD, Cleophas Dunker   . Depression   . DIABETES MELLITUS, TYPE II 10/13/2007   Qualifier: Diagnosis of  By:  Brand RMA, Valentina GuLucy    . HYPERLIPIDEMIA 10/13/2007   Qualifier: Diagnosis of  By: Tyrone AppleBrand RMA, Lucy    . HYPERTENSION 10/13/2007   Qualifier: Diagnosis of  By: Tyrone AppleBrand RMA, Lucy    . MENOPAUSAL SYNDROME 10/13/2007   Qualifier: Diagnosis of  By: Charlsie QuestBrand RMA, Lucy    . OSTEOPOROSIS 10/13/2007   Qualifier: Diagnosis of  By: Samara SnideBrand RMA, Lucy      Family History  Problem Relation Age of Onset  . Anxiety disorder Mother   . Cancer Neg Hx   . Breast cancer Neg Hx     Social History   Socioeconomic  History  . Marital status: Married    Spouse name: Not on file  . Number of children: Not on file  . Years of education: Not on file  . Highest education level: Not on file  Occupational History  . Occupation: Retired   Engineer, productionocial Needs  . Financial resource strain: Not on file  . Food insecurity:    Worry: Not on file    Inability: Not on file  . Transportation needs:    Medical: Not on file    Non-medical: Not on file  Tobacco Use  . Smoking status: Never Smoker  . Smokeless tobacco: Never Used  Substance and Sexual Activity  . Alcohol use: No  . Drug use: No  . Sexual activity: Yes  Lifestyle  . Physical activity:    Days per week: Not on file    Minutes per session: Not on file  . Stress: Not on file  Relationships  . Social connections:    Talks on phone: Not on file    Gets together: Not on file    Attends religious service: Not on file    Active member of club or organization: Not on file    Attends meetings of clubs or organizations: Not on file    Relationship status: Not on file  . Intimate partner violence:    Fear of current or ex partner: Not on file    Emotionally abused: Not on file    Physically abused: Not on file    Forced sexual activity: Not on file  Other Topics Concern  . Not on file  Social History Narrative  . Not on file    Past Medical History, Surgical history, Social history, and Family history were reviewed and updated as appropriate.   Please see review of systems for further details on the patient's review from today.   Objective:   Physical Exam:  There were no vitals taken for this visit.  Physical Exam  Lab Review:     Component Value Date/Time   NA 135 02/13/2019 1031   K 4.2 02/13/2019 1031   CL 101 02/13/2019 1031   CO2 26 02/13/2019 1031   GLUCOSE 166 (H) 02/13/2019 1031   BUN 8 02/13/2019 1031   CREATININE 0.82 02/13/2019 1031   CALCIUM 11.0 (H) 02/13/2019 1031   CALCIUM 10.8 (H) 02/19/2012 1129   PROT 7.6  05/24/2018 1336   ALBUMIN 4.5 05/24/2018 1336   AST 28 05/24/2018 1336   ALT 32 05/24/2018 1336   ALKPHOS 79 05/24/2018 1336   BILITOT 1.2 05/24/2018 1336   GFRNONAA >60 01/15/2017 0941   GFRAA >60 01/15/2017 0941       Component Value Date/Time   WBC 11.9 (H) 05/24/2018 1336   RBC 4.75 05/24/2018 1336   HGB 15.0 05/24/2018 1336   HGB 15.3 12/16/2010 1045   HCT 42.2 05/24/2018  1336   HCT 43.8 12/16/2010 1045   PLT 305.0 05/24/2018 1336   PLT 224 12/16/2010 1045   MCV 88.9 05/24/2018 1336   MCV 88.9 12/16/2010 1045   MCH 32.6 01/15/2017 0941   MCHC 35.6 05/24/2018 1336   RDW 12.8 05/24/2018 1336   RDW 12.5 12/16/2010 1045   LYMPHSABS 1.8 05/24/2018 1336   LYMPHSABS 2.1 12/16/2010 1045   MONOABS 0.6 05/24/2018 1336   MONOABS 0.8 12/16/2010 1045   EOSABS 0.0 05/24/2018 1336   EOSABS 0.3 12/16/2010 1045   BASOSABS 0.1 05/24/2018 1336   BASOSABS 0.1 12/16/2010 1045    No results found for: POCLITH, LITHIUM   No results found for: PHENYTOIN, PHENOBARB, VALPROATE, CBMZ   .res Assessment: Plan:   Patient seen for 30 minutes and greater than 50% of visit spent counseling patient and her family regarding potential causes of recent confusion and concentration difficulties to include possible delirium versus worsening anxiety and depression.  Encouraged family to schedule visit with primary care provider to rule out any potential medical causes for recent changes in mental status and to discuss elevated glucose and calcium levels.  Also discussed diet cognitive changes may have coincided with restarting Aricept.  Will therefore hold Aricept until next visit to determine if this may be contributing to mental status changes. Will restart Lexapro 5 mg daily for 1 week and then increase to 10 mg daily for depression and anxiety since patient was on Lexapro briefly several years ago and seemed to tolerate it without any difficulty.  Discussed potential benefits, risk, and side effects of  Lexapro. Will decrease Remeron to 30 mg 1/2 tablet at bedtime for 3 to 5 days, then stop due to nightmares and vivid dreams and no significant improvement in signs and symptoms. Also time spent counseling patient about possible strategies to improve mood and mental status to include accepting granddaughter's offer to help her manage meds and/or set out medications or one week at a time to ensure that she is taking medications as prescribed. Discussed maintaining healthy appetite and eating regularly to try to maintain stable glucose levels as much as possible, since elevated glucose and fluctuations in glucose levels can also worsen confusion. Patient advised to contact office with any questions, adverse effects, or acute worsening in signs and symptoms.  Major depressive disorder in partial remission, unspecified whether recurrent (HCC) - Plan: mirtazapine (REMERON) 30 MG tablet, escitalopram (LEXAPRO) 10 MG tablet  Anxiety disorder, unspecified type - Plan: mirtazapine (REMERON) 30 MG tablet, escitalopram (LEXAPRO) 10 MG tablet  Please see After Visit Summary for patient specific instructions.  Future Appointments  Date Time Provider Department Center  03/03/2019  1:30 PM Romero Belling, MD [facility] None  03/15/2019 10:30 AM [name], PMHNP [facility] None  04/19/2019  9:00 AM [name], FNP [facility]    No orders of the defined types were placed in this encounter.     -------------------------------

## 2019-02-22 ENCOUNTER — Ambulatory Visit: Payer: Medicare Other | Admitting: Endocrinology

## 2019-02-22 ENCOUNTER — Encounter: Payer: Self-pay | Admitting: Endocrinology

## 2019-02-22 ENCOUNTER — Other Ambulatory Visit: Payer: Self-pay

## 2019-02-22 VITALS — BP 128/82 | HR 86 | Temp 97.6°F | Resp 12 | Ht 62.0 in | Wt 137.0 lb

## 2019-02-22 DIAGNOSIS — E119 Type 2 diabetes mellitus without complications: Secondary | ICD-10-CM

## 2019-02-22 DIAGNOSIS — E210 Primary hyperparathyroidism: Secondary | ICD-10-CM | POA: Diagnosis not present

## 2019-02-22 DIAGNOSIS — Z0000 Encounter for general adult medical examination without abnormal findings: Secondary | ICD-10-CM | POA: Diagnosis not present

## 2019-02-22 LAB — POCT GLYCOSYLATED HEMOGLOBIN (HGB A1C): Hemoglobin A1C: 6.8 % — AB (ref 4.0–5.6)

## 2019-02-22 MED ORDER — DOXYCYCLINE HYCLATE 100 MG PO TABS: 100.0000 mg | ORAL_TABLET | Freq: Two times a day (BID) | ORAL | 0 refills | Status: DC

## 2019-02-22 MED ORDER — CINACALCET HCL 30 MG PO TABS: 30.0000 mg | ORAL_TABLET | Freq: Every day | ORAL | 2 refills | Status: DC

## 2019-02-22 NOTE — Patient Instructions (Addendum)
No medication is needed for the diabetes now. I have sent a prescription to your pharmacy, for an antibiotic pill. Please see your new primary care provider as scheduled. I have also sent a prescription to your pharmacy, to lower the calcium.  Please redo the blood test next week. Please come back for a follow-up appointment in 1 month.

## 2019-02-22 NOTE — Progress Notes (Signed)
Subjective:    Patient ID: Brandi Walsh, female    DOB: 1946/03/27, 73 y.o.   MRN: [id number]  HPI Pt returns for f/u of diabetes mellitus:  DM type: 2 Dx'ed: 2015 Complications:  Therapy: none GDM: 1973 DKA: never Severe hypoglycemia: never Pancreatitis: never Pancreatic imaging: never Other: she stopped pioglitazone, due to weight gain.   Interval history: pt states she feels well in general.   She also returns for f/u of primary hyperparathyroidism.  She has not required medication.   Osteoporosis was recently noted.   Pt has some memory loss.   Past Medical History:  Diagnosis Date  . COUGH DUE TO ACE INHIBITORS 11/25/2009   Qualifier: Diagnosis of  By: Everardo All MD, Cleophas Dunker   . Depression   . DIABETES MELLITUS, TYPE II 10/13/2007   Qualifier: Diagnosis of  By: Charlsie Quest RMA, Lucy    . HYPERLIPIDEMIA 10/13/2007   Qualifier: Diagnosis of  By: Tyrone Apple, Lucy    . HYPERTENSION 10/13/2007   Qualifier: Diagnosis of  By: Tyrone Apple, Lucy    . MENOPAUSAL SYNDROME 10/13/2007   Qualifier: Diagnosis of  By: Charlsie Quest RMA, Lucy    . OSTEOPOROSIS 10/13/2007   Qualifier: Diagnosis of  By: Samara Snide      Past Surgical History:  Procedure Laterality Date  . ABDOMINAL HYSTERECTOMY    . BREAST BIOPSY  08/2002  . TONSILLECTOMY      Social History   Socioeconomic History  . Marital status: Married    Spouse name: Not on file  . Number of children: Not on file  . Years of education: Not on file  . Highest education level: Not on file  Occupational History  . Occupation: Retired   Engineer, production  . Financial resource strain: Not on file  . Food insecurity:    Worry: Not on file    Inability: Not on file  . Transportation needs:    Medical: Not on file    Non-medical: Not on file  Tobacco Use  . Smoking status: Never Smoker  . Smokeless tobacco: Never Used  Substance and Sexual Activity  . Alcohol use: No  . Drug use: No  . Sexual activity: Yes  Lifestyle  . Physical  activity:    Days per week: Not on file    Minutes per session: Not on file  . Stress: Not on file  Relationships  . Social connections:    Talks on phone: Not on file    Gets together: Not on file    Attends religious service: Not on file    Active member of club or organization: Not on file    Attends meetings of clubs or organizations: Not on file    Relationship status: Not on file  . Intimate partner violence:    Fear of current or ex partner: Not on file    Emotionally abused: Not on file    Physically abused: Not on file    Forced sexual activity: Not on file  Other Topics Concern  . Not on file  Social History Narrative  . Not on file    Current Outpatient Medications on File Prior to Visit  Medication Sig Dispense Refill  . escitalopram (LEXAPRO) 10 MG tablet Take 1/2 tablet daily for one week, then 1 tablet daily 30 tablet 1  . glucose blood (FREESTYLE LITE) test strip TEST ONCE DAILY 50 each 0  . losartan (COZAAR) 100 MG tablet TAKE 1 TABLET EVERY DAY 90 tablet  2  . mirtazapine (REMERON) 30 MG tablet Decrease to 15 mg at bedtime for 3-5 days, then stop 30 tablet 1  . Multiple Vitamin (THERA) TABS Take by mouth.    . OLANZapine (ZYPREXA) 2.5 MG tablet Take 1 tablet (2.5 mg total) by mouth at bedtime. 90 tablet 0  . rosuvastatin (CRESTOR) 40 MG tablet TAKE 1 TABLET BY MOUTH EVERY DAY 90 tablet 0   No current facility-administered medications on file prior to visit.     Allergies  Allergen Reactions  . Lovastatin     REACTION: Rash    Family History  Problem Relation Age of Onset  . Anxiety disorder Mother   . Cancer Neg Hx   . Breast cancer Neg Hx     BP 128/82   Pulse 86   Temp 97.6 F (36.4 C)   Resp 12   Ht 5\' 2"  (1.575 m)   Wt 137 lb (62.1 kg)   SpO2 97%   BMI 25.06 kg/m    Review of Systems Denies fever    Objective:   Physical Exam VITAL SIGNS:  See vs page GENERAL: no distress Pulses: dorsalis pedis intact bilat.   MSK: no deformity  of the feet CV: no leg edema Skin:  no ulcer on the feet.  normal color and temp on the feet. Neuro: sensation is intact to touch on the feet.  Ext: slight erythema of the left great toenail.    Lab Results  Component Value Date   HGBA1C 6.8 (A) 02/22/2019   Lab Results  Component Value Date   CREATININE 0.82 02/13/2019   BUN 8 02/13/2019   NA 135 02/13/2019   K 4.2 02/13/2019   CL 101 02/13/2019   CO2 26 02/13/2019   Lab Results  Component Value Date   PTH 118 (H) 05/24/2018   CALCIUM 11.0 (H) 02/13/2019      Assessment & Plan:  Type 2 DM: stable.  No rx is needed now Paronychial infection, new Primary hyperparathyroidism: memory loss is an indication for medication Memory loss: she is not a surgical candidate.  Patient Instructions  No medication is needed for the diabetes now. I have sent a prescription to your pharmacy, for an antibiotic pill. Please see your new primary care provider as scheduled. I have also sent a prescription to your pharmacy, to lower the calcium.  Please redo the blood test next week. Please come back for a follow-up appointment in 1 month.

## 2019-02-23 ENCOUNTER — Telehealth: Payer: Self-pay | Admitting: Psychiatry

## 2019-02-23 NOTE — Telephone Encounter (Signed)
Brandi Walsh saw Dr. Everardo All today and saw that you had changed her mirtazapine prescription to decrease to 1/2 pill 3-5 days then stop.  She doesn't remember discussing this.  Please call her to explain.

## 2019-02-24 ENCOUNTER — Telehealth: Payer: Self-pay | Admitting: Endocrinology

## 2019-02-24 NOTE — Telephone Encounter (Signed)
Verbalized that conversation and it has been discontinued as recommended.

## 2019-02-24 NOTE — Telephone Encounter (Signed)
Patients grand daughter called stating that the new RX Dr.Ellison prescribed is currently one back order and would like to know if they need to push out the labs and appointment.

## 2019-02-27 ENCOUNTER — Telehealth: Payer: Self-pay | Admitting: Psychiatry

## 2019-02-27 ENCOUNTER — Ambulatory Visit: Payer: Medicare Other | Admitting: Psychiatry

## 2019-02-27 ENCOUNTER — Telehealth: Payer: Self-pay

## 2019-02-27 NOTE — Telephone Encounter (Signed)
Granddaughter Brandi Walsh called to report the [name] did not titrate up on the Lexapro.  She missed the instructions to start with 1/2 pill and started her on the full pill.  Since she started that way she is continuing it.  Also Dr. Everardo All prescribed Sensipar.  [name] has not started this yet because it needed a PA and wasn't in stock.  But Brandi Walsh wants your advice on taking this medication.  She feels that Dr. Everardo All is trying to treat [name] for the same things you are treating her for and she doesn't want the medicines to counteract each other or to be too much medication.  Please call her with her advice on this. Also fyi Dr. Everardo All prescribe a medication for a toe infection that [name] didn't even know she had. Brandi Walsh is just trying to keep a handle on who is treating what and with which medications.

## 2019-02-27 NOTE — Telephone Encounter (Signed)
Cinacalcet is not covered by patient insurance.  Please advise if you would like to attempt a PA.

## 2019-02-27 NOTE — Telephone Encounter (Signed)
Yes, please.  There is no alternative.

## 2019-02-28 ENCOUNTER — Telehealth: Payer: Self-pay | Admitting: Endocrinology

## 2019-02-28 ENCOUNTER — Other Ambulatory Visit (INDEPENDENT_AMBULATORY_CARE_PROVIDER_SITE_OTHER): Payer: Medicare Other

## 2019-02-28 ENCOUNTER — Other Ambulatory Visit: Payer: Self-pay

## 2019-02-28 DIAGNOSIS — E210 Primary hyperparathyroidism: Secondary | ICD-10-CM

## 2019-02-28 LAB — BASIC METABOLIC PANEL
BUN: 15 mg/dL (ref 6–23)
CO2: 27 mEq/L (ref 19–32)
CREATININE: 0.77 mg/dL (ref 0.40–1.20)
Calcium: 11.2 mg/dL — ABNORMAL HIGH (ref 8.4–10.5)
Chloride: 96 mEq/L (ref 96–112)
GFR: 73.44 mL/min (ref >60.00)
Glucose, Bld: 141 mg/dL — ABNORMAL HIGH (ref 70–99)
Potassium: 3.6 mEq/L (ref 3.5–5.1)
Sodium: 131 mEq/L — ABNORMAL LOW (ref 135–145)

## 2019-02-28 LAB — VITAMIN D 25 HYDROXY (VIT D DEFICIENCY, FRACTURES): VITD: 36.93 ng/mL (ref 30.00–100.00)

## 2019-02-28 NOTE — Telephone Encounter (Signed)
There is no alternative to cinacalcet, so we need PA.  please also contact patient: D/c doxycycline Please also see PCP about are on lip, as it is prob not related.

## 2019-02-28 NOTE — Telephone Encounter (Signed)
Spoke with [name], she confirmed it is Sensipar for her calcium. She hasn't started yet still pending a PA but will when they are able to get it. She was also aware of the Remeron being discontinued.  She did want to let you know pt's thoughts seem to be slightly clearer, [name] said not a huge difference but wanted to let you know. Pt started the lexapro and doxycycline. They didn't get a urinalysis on her but the doctor did prescribe that as well.  Instructed to call back with anymore problems or concerns.

## 2019-02-28 NOTE — Telephone Encounter (Signed)
Please advise 

## 2019-02-28 NOTE — Telephone Encounter (Signed)
lft vm to discuss

## 2019-02-28 NOTE — Telephone Encounter (Signed)
PA has been started on covermymeds and pt informed to d/c doxycycline

## 2019-02-28 NOTE — Telephone Encounter (Signed)
Left voicemail to call back, ok for Jola Babinski to give message from provider if she returns the call.

## 2019-02-28 NOTE — Telephone Encounter (Signed)
Patients granddaughter called regarding whether or not her grandmother needs to start Cinacalcet due to prior auth and the fact that the patient is starting show improvement in her status.  Also per granddaughter the patient has a blisterlike place on her lip since starting Doxycycline.  Also wants to know if they need to come on today to do urine test if patient has not yet started Cinacalcet?

## 2019-03-01 ENCOUNTER — Telehealth: Payer: Self-pay | Admitting: Endocrinology

## 2019-03-01 LAB — PTH, INTACT AND CALCIUM
Calcium: 11.1 mg/dL — ABNORMAL HIGH (ref 8.6–10.4)
PTH: 34 pg/mL (ref 14–64)

## 2019-03-01 NOTE — Telephone Encounter (Signed)
[employer] is calling about a request they received for a prescription, they stated they are needing more clinical information  PHONE- [phone] OPTION 5

## 2019-03-01 NOTE — Telephone Encounter (Signed)
lft vm to return call 

## 2019-03-02 ENCOUNTER — Telehealth: Payer: Self-pay | Admitting: Family

## 2019-03-02 ENCOUNTER — Telehealth: Payer: Self-pay | Admitting: Endocrinology

## 2019-03-02 NOTE — Telephone Encounter (Signed)
How would like to handled this? She has an appointment is May to est.

## 2019-03-02 NOTE — Telephone Encounter (Signed)
Pt's grand daughter, [name] is calling back. She was advised she would receive a call when we had an answer. Thanks

## 2019-03-02 NOTE — Telephone Encounter (Signed)
Copied from CRM [id number]. Topic: Quick Communication - See Telephone Encounter >> Mar 02, 2019  9:32 AM Louie Bun, Rosey Bath D wrote: CRM for notification. See Telephone encounter for: 03/02/19. Patient grand daughter [name] call would like to talk to someone about scheduling patient in sooner for a NP appt. She said that patient has a UTI and since patient is mentally stable she cant remember things didn't say anything about it sooner. Patients last PCP wont see her anymore. [name] would like a call back at [phone].

## 2019-03-02 NOTE — Telephone Encounter (Signed)
Granddaughter Brandi Walsh is returning a call from the office that they received yesterday morning  [phone]

## 2019-03-02 NOTE — Telephone Encounter (Signed)
Fyi     Granddaughter was rude and upset that Dr. Everardo All will not continue to provide care for her grandmother as her PCP, I did explain to [name] that letters were mailed out last year and that he no longer did primary care as of 09/30/18 but he would refill medication until pt established with a PCP. She stated that no medication was needed and that she wanted a urine test done during last appt and if he could not do that then there was no need for Dr. Everardo All to see her grandmother again.

## 2019-03-03 ENCOUNTER — Telehealth: Payer: Self-pay | Admitting: Endocrinology

## 2019-03-03 ENCOUNTER — Ambulatory Visit: Payer: Self-pay | Admitting: Endocrinology

## 2019-03-03 NOTE — Telephone Encounter (Signed)
[employer] is calling about getting clinical information on a prescription that was sent over to them  Please advise  Phone- [phone] Option 5  They were advised that the nurse had left a message on 4/1 but they said they did not receive any messages from our office.

## 2019-03-03 NOTE — Telephone Encounter (Signed)
We can try to do virtual visit regarding UTI; unfortunately, though, this will be all we can address for her as a NP with virtual visit. Maybe try 11:20?

## 2019-03-03 NOTE — Telephone Encounter (Signed)
Spoke with [name] today. Based on patient info and hx we would not be able to do VV with patient today regarding UTI since she was currently not showing any signs of a UTI. Based on info [name] shared we would need to wait until May's appointment to address things and who was managing what. [name] voiced understanding. Also did let her know that May's appointment may change due to COVID 19 but we would keep her updated if appointment needed to be moved.

## 2019-03-03 NOTE — Telephone Encounter (Signed)
[employer] has called to inform that cinacalcet (SENSIPAR) 30 MG tablet was denied and they will be faxing that over.  Also provided an appeal # for the provider to call.  [phone]

## 2019-03-07 NOTE — Telephone Encounter (Signed)
Please ask PCP to do a telephone visit for that.

## 2019-03-07 NOTE — Telephone Encounter (Signed)
And the Doxy?

## 2019-03-07 NOTE — Telephone Encounter (Signed)
Relayed this information to [place], She would like to know what the patient is supposed to do if she is no longer taking the doxycycline? She cannot get the patient in to see a PCP soon due to her refusal to a telehealth visit with them because of the complexity of the patients care.

## 2019-03-07 NOTE — Telephone Encounter (Signed)
Documents for Dr. Everardo All to complete this appeal have been placed on his desk and are awaiting his return for his completion.

## 2019-03-07 NOTE — Telephone Encounter (Signed)
Have we attempted the appear yet need to follow up with the pt

## 2019-03-07 NOTE — Telephone Encounter (Signed)
Brandi Walsh is aware that the patient is not a PCP patient anymore but would like to see if we can do a urinalysis for the patient to see if she has a UTI?

## 2019-03-07 NOTE — Telephone Encounter (Signed)
i'll do PA tomorrow

## 2019-03-08 NOTE — Telephone Encounter (Signed)
This is for primary hyperparathyroidism.  She is not a surgical candidate.

## 2019-03-08 NOTE — Telephone Encounter (Signed)
Called pt and informed to contact her PCP re: her request for Doxy. In addition, pt informed me that she requested her Rx be transferred to [facility]. States the cost of Rx was most affordable at this pharmacy. In light of her comment, uncertain if PA is still required. Please advise

## 2019-03-09 NOTE — Telephone Encounter (Signed)
Brandi Walsh called to speak with Brandi Walsh- relayed info from Brandi Walsh and Brandi Walsh, she wants to do a virtual visit to further discuss so it is set up for Tuesday PM.  She also let me know that she is going to pay for the medication that the appeal is being done for out of pocket

## 2019-03-09 NOTE — Telephone Encounter (Signed)
Called [employer] to initiate appeals process for Cinacalcet. Per Dr. Everardo All, serum calcium level is above laboratory's upper limit of normal @ 11.1 and pt is unable to undergo parathyroidectomy due to her memory loss R41.3. Spoke with Demetrius Revel. Ref #[id number]/08/2019. Advised to fax to #[phone] a letter with Member ID, DOB, Point of Contact, Clinical info to support appeal (lab results and office note), office demographics. Appeal process will take 7 days to finalize determination. Letter composed and signed today. Faxed letter and enclosed lab results and office notes to [phone]. Confirmation received. Will await determination.

## 2019-03-09 NOTE — Telephone Encounter (Signed)
Please be advised.  °

## 2019-03-10 ENCOUNTER — Other Ambulatory Visit: Payer: Self-pay | Admitting: Psychiatry

## 2019-03-10 DIAGNOSIS — F324 Major depressive disorder, single episode, in partial remission: Secondary | ICD-10-CM

## 2019-03-10 DIAGNOSIS — F419 Anxiety disorder, unspecified: Secondary | ICD-10-CM

## 2019-03-14 ENCOUNTER — Ambulatory Visit: Payer: Self-pay | Admitting: Endocrinology

## 2019-03-14 NOTE — Telephone Encounter (Signed)
Received courtesy call from [facility] informing medication has been approved. Letter to follow.

## 2019-03-14 NOTE — Telephone Encounter (Signed)
Received notification from [facility] that denial for Cinacalcet has been over turned and has been APPROVED 02/28/19 through 02/28/20. Auth #[id number]. Letter has been labeled and placed in scan file for HIM and for our future reference.

## 2019-03-15 ENCOUNTER — Encounter: Payer: Self-pay | Admitting: Endocrinology

## 2019-03-15 ENCOUNTER — Other Ambulatory Visit: Payer: Self-pay

## 2019-03-15 ENCOUNTER — Ambulatory Visit (INDEPENDENT_AMBULATORY_CARE_PROVIDER_SITE_OTHER): Payer: Medicare Other | Admitting: Endocrinology

## 2019-03-15 ENCOUNTER — Ambulatory Visit: Payer: Medicare Other | Admitting: Psychiatry

## 2019-03-15 HISTORY — DX: Hypercalcemia: E83.52

## 2019-03-15 MED ORDER — FREESTYLE FREEDOM LITE W/DEVICE KIT: 1.0000 | PACK | Freq: Once | 0 refills | Status: AC

## 2019-03-15 MED ORDER — GLUCOSE BLOOD VI STRP: 1.0000 | ORAL_STRIP | Freq: Every day | 3 refills | Status: DC

## 2019-03-15 NOTE — Patient Instructions (Addendum)
Before starting the Sensipar, please come in to have blood tests drawn.  Please come back for a follow-up appointment in 1 month.

## 2019-03-15 NOTE — Progress Notes (Addendum)
Subjective:    Patient ID: Brandi Walsh, female    DOB: 1946/02/28, 73 y.o.   MRN: [id number]  HPI  telehealth visit today via doxy video visit.  Alternatives to telehealth are presented to this patient, and the patient agrees to the telehealth visit. Pt is advised of the cost of the visit, and agrees to this, also.   Patient is at home, and I am at the office.   Pt returns for f/u of diabetes mellitus:  DM type: 2 Dx'ed: 2015 Complications: none Therapy: none GDM: 1973 DKA: never Severe hypoglycemia: never Pancreatitis: never Pancreatic imaging: never Other: she stopped pioglitazone, due to weight gain.     She also returns for f/u of primary hyperparathyroidism.  She has not required medication.   Osteoporosis was recently noted.   Pt has some memory loss.   Interval hx: She has not started sensipar, due to insurance.  dtr says approval has been received. No new sxs  Past Medical History:  Diagnosis Date  . COUGH DUE TO ACE INHIBITORS 11/25/2009   Qualifier: Diagnosis of  By: Everardo AllEllison MD, Cleophas DunkerSean A   . Depression   . DIABETES MELLITUS, TYPE II 10/13/2007   Qualifier: Diagnosis of  By: Charlsie QuestBrand RMA, Lucy    . HYPERLIPIDEMIA 10/13/2007   Qualifier: Diagnosis of  By: Tyrone AppleBrand RMA, Lucy    . HYPERTENSION 10/13/2007   Qualifier: Diagnosis of  By: Tyrone AppleBrand RMA, Lucy    . MENOPAUSAL SYNDROME 10/13/2007   Qualifier: Diagnosis of  By: Charlsie QuestBrand RMA, Lucy    . OSTEOPOROSIS 10/13/2007   Qualifier: Diagnosis of  By: Samara SnideBrand RMA, Lucy      Past Surgical History:  Procedure Laterality Date  . ABDOMINAL HYSTERECTOMY    . BREAST BIOPSY  08/2002  . TONSILLECTOMY      Social History   Socioeconomic History  . Marital status: Married    Spouse name: Not on file  . Number of children: Not on file  . Years of education: Not on file  . Highest education level: Not on file  Occupational History  . Occupation: Retired   Engineer, productionocial Needs  . Financial resource strain: Not on file  . Food  insecurity:    Worry: Not on file    Inability: Not on file  . Transportation needs:    Medical: Not on file    Non-medical: Not on file  Tobacco Use  . Smoking status: Never Smoker  . Smokeless tobacco: Never Used  Substance and Sexual Activity  . Alcohol use: No  . Drug use: No  . Sexual activity: Yes  Lifestyle  . Physical activity:    Days per week: Not on file    Minutes per session: Not on file  . Stress: Not on file  Relationships  . Social connections:    Talks on phone: Not on file    Gets together: Not on file    Attends religious service: Not on file    Active member of club or organization: Not on file    Attends meetings of clubs or organizations: Not on file    Relationship status: Not on file  . Intimate partner violence:    Fear of current or ex partner: Not on file    Emotionally abused: Not on file    Physically abused: Not on file    Forced sexual activity: Not on file  Other Topics Concern  . Not on file  Social History Narrative  . Not on file  Current Outpatient Medications on File Prior to Visit  Medication Sig Dispense Refill  . cinacalcet (SENSIPAR) 30 MG tablet Take 1 tablet (30 mg total) by mouth daily. 30 tablet 2  . escitalopram (LEXAPRO) 10 MG tablet TAKE 1/2 TABLET DAILY FOR ONE WEEK, THEN 1 TABLET DAILY 90 tablet 0  . losartan (COZAAR) 100 MG tablet TAKE 1 TABLET EVERY DAY 90 tablet 2  . mirtazapine (REMERON) 30 MG tablet Decrease to 15 mg at bedtime for 3-5 days, then stop 30 tablet 1  . Multiple Vitamin (THERA) TABS Take by mouth.    . OLANZapine (ZYPREXA) 2.5 MG tablet Take 1 tablet (2.5 mg total) by mouth at bedtime. 90 tablet 0  . rosuvastatin (CRESTOR) 40 MG tablet TAKE 1 TABLET BY MOUTH EVERY DAY 90 tablet 0   No current facility-administered medications on file prior to visit.     Allergies  Allergen Reactions  . Lovastatin     REACTION: Rash    Family History  Problem Relation Age of Onset  . Anxiety disorder Mother    . Hyperparathyroidism Sister   . Cancer Neg Hx   . Breast cancer Neg Hx      Review of Systems No cramps    Objective:   Physical Exam   Lab Results  Component Value Date   PTH 44 03/16/2019   CALCIUM 10.5 03/16/2019   CALCIUM 10.6 (H) 03/16/2019   PHOS 2.7 03/16/2019   Lab Results  Component Value Date   CREATININE 0.74 03/16/2019   BUN 10 03/16/2019   NA 136 03/16/2019   K 4.1 03/16/2019   CL 102 03/16/2019   CO2 28 03/16/2019      Assessment & Plan:  Hypercalcemia, uncertain etiology.  Improved.  I advised pt to hold off on sensipar.  Hyperparathyroidism: also improved.  We'll follow Please come back for a follow-up appointment in 1 month.

## 2019-03-16 ENCOUNTER — Other Ambulatory Visit: Payer: Self-pay

## 2019-03-16 ENCOUNTER — Other Ambulatory Visit (INDEPENDENT_AMBULATORY_CARE_PROVIDER_SITE_OTHER): Payer: Medicare Other

## 2019-03-16 LAB — HEPATIC FUNCTION PANEL
ALT: 27 U/L (ref 0–35)
AST: 20 U/L (ref 0–37)
Albumin: 4 g/dL (ref 3.5–5.2)
Alkaline Phosphatase: 85 U/L (ref 39–117)
Bilirubin, Direct: 0.2 mg/dL (ref 0.0–0.3)
Total Bilirubin: 0.8 mg/dL (ref 0.2–1.2)
Total Protein: 7 g/dL (ref 6.0–8.3)

## 2019-03-16 LAB — BASIC METABOLIC PANEL
BUN: 10 mg/dL (ref 6–23)
CO2: 28 mEq/L (ref 19–32)
Calcium: 10.5 mg/dL (ref 8.4–10.5)
Chloride: 102 mEq/L (ref 96–112)
Creatinine, Ser: 0.74 mg/dL (ref 0.40–1.20)
GFR: 76.88 mL/min (ref >60.00)
Glucose, Bld: 132 mg/dL — ABNORMAL HIGH (ref 70–99)
Potassium: 4.1 mEq/L (ref 3.5–5.1)
Sodium: 136 mEq/L (ref 135–145)

## 2019-03-16 LAB — VITAMIN D 25 HYDROXY (VIT D DEFICIENCY, FRACTURES): VITD: 36.07 ng/mL (ref 30.00–100.00)

## 2019-03-16 LAB — PHOSPHORUS: Phosphorus: 2.7 mg/dL (ref 2.3–4.6)

## 2019-03-16 LAB — MAGNESIUM: Magnesium: 2 mg/dL (ref 1.5–2.5)

## 2019-03-17 ENCOUNTER — Telehealth: Payer: Self-pay | Admitting: Endocrinology

## 2019-03-17 ENCOUNTER — Other Ambulatory Visit: Payer: Self-pay | Admitting: Psychiatry

## 2019-03-17 DIAGNOSIS — F419 Anxiety disorder, unspecified: Secondary | ICD-10-CM

## 2019-03-17 DIAGNOSIS — F324 Major depressive disorder, single episode, in partial remission: Secondary | ICD-10-CM

## 2019-03-17 LAB — PTH, INTACT AND CALCIUM
Calcium: 10.6 mg/dL — ABNORMAL HIGH (ref 8.6–10.4)
PTH: 44 pg/mL (ref 14–64)

## 2019-03-17 NOTE — Telephone Encounter (Signed)
Patient said that she had several missed calls from our office and was just returning the call.  Please Advise, thanks

## 2019-03-17 NOTE — Telephone Encounter (Signed)
Those calls were in relation to her appt which was completed yesterday. It seems she failed to listen to her VM's until today causing her to think we called today. All concerns were addressed during her visit yesterday.

## 2019-03-22 ENCOUNTER — Ambulatory Visit: Payer: Medicare Other | Admitting: Psychiatry

## 2019-03-23 ENCOUNTER — Ambulatory Visit: Payer: Self-pay | Admitting: Endocrinology

## 2019-03-28 ENCOUNTER — Other Ambulatory Visit: Payer: Self-pay

## 2019-03-28 DIAGNOSIS — F324 Major depressive disorder, single episode, in partial remission: Secondary | ICD-10-CM

## 2019-03-28 DIAGNOSIS — F419 Anxiety disorder, unspecified: Secondary | ICD-10-CM

## 2019-04-12 ENCOUNTER — Other Ambulatory Visit: Payer: Self-pay

## 2019-04-14 ENCOUNTER — Ambulatory Visit: Payer: Medicare Other | Admitting: Endocrinology

## 2019-04-18 ENCOUNTER — Encounter: Payer: Self-pay | Admitting: Endocrinology

## 2019-04-18 ENCOUNTER — Ambulatory Visit: Payer: Medicare Other | Admitting: Psychiatry

## 2019-04-19 ENCOUNTER — Ambulatory Visit (INDEPENDENT_AMBULATORY_CARE_PROVIDER_SITE_OTHER): Payer: Medicare Other | Admitting: Family

## 2019-04-19 ENCOUNTER — Encounter: Payer: Self-pay | Admitting: Family

## 2019-04-19 ENCOUNTER — Ambulatory Visit: Payer: Self-pay | Admitting: Family

## 2019-04-19 ENCOUNTER — Encounter: Payer: Self-pay | Admitting: Endocrinology

## 2019-04-19 ENCOUNTER — Other Ambulatory Visit: Payer: Self-pay

## 2019-04-19 ENCOUNTER — Encounter: Payer: Medicare Other | Admitting: Endocrinology

## 2019-04-19 ENCOUNTER — Ambulatory Visit (INDEPENDENT_AMBULATORY_CARE_PROVIDER_SITE_OTHER): Payer: Medicare Other | Admitting: Endocrinology

## 2019-04-19 VITALS — Wt 132.0 lb

## 2019-04-19 DIAGNOSIS — E119 Type 2 diabetes mellitus without complications: Secondary | ICD-10-CM

## 2019-04-19 DIAGNOSIS — E785 Hyperlipidemia, unspecified: Secondary | ICD-10-CM

## 2019-04-19 DIAGNOSIS — M810 Age-related osteoporosis without current pathological fracture: Secondary | ICD-10-CM

## 2019-04-19 DIAGNOSIS — E210 Primary hyperparathyroidism: Secondary | ICD-10-CM | POA: Diagnosis not present

## 2019-04-19 DIAGNOSIS — I10 Essential (primary) hypertension: Secondary | ICD-10-CM | POA: Diagnosis not present

## 2019-04-19 DIAGNOSIS — Z1231 Encounter for screening mammogram for malignant neoplasm of breast: Secondary | ICD-10-CM | POA: Diagnosis not present

## 2019-04-19 DIAGNOSIS — F3342 Major depressive disorder, recurrent, in full remission: Secondary | ICD-10-CM

## 2019-04-19 MED ORDER — ROSUVASTATIN CALCIUM 40 MG PO TABS: 40.0000 mg | ORAL_TABLET | Freq: Every day | ORAL | 1 refills | Status: DC

## 2019-04-19 MED ORDER — LOSARTAN POTASSIUM 100 MG PO TABS: 100.0000 mg | ORAL_TABLET | Freq: Every day | ORAL | 1 refills | Status: DC

## 2019-04-19 NOTE — Progress Notes (Signed)
Subjective:    Patient ID: Brandi GitelmanBrenda P Walsh, female    DOB: 1946/09/06, 73 y.o.   MRN: [id number]  HPI telehealth visit today via doxy video visit.  Alternatives to telehealth are presented to this patient, and the patient agrees to the telehealth visit. Pt is advised of the cost of the visit, and agrees to this, also.   Patient is at home, and I am at the office.   Persons attending the telehealth visit: the patient, granddaughter, and I Pt returns for f/u of diabetes mellitus:  DM type: 2 Dx'ed: 2015 Complications: none Therapy: none GDM: 73 DKA: never Severe hypoglycemia: never Pancreatitis: never Pancreatic imaging: never Other: she stopped pioglitazone, due to weight gain.     Interval history: pt states she feels well in general.  She does not check cbg.  She also returns for f/u of primary hyperparathyroidism.  She has not required medication.  She takes vitamin-D, 2000 units/day. She was rx'ed sensipar, but she did not take, due to insurance.  Ca++ improved off it.  Denies LOC.   Pt also returns for f/u of osteoporosis.  Dx'ed: 2015 Current rx: none Past rx: none   Last DEXA (2019): RFN: -2.3 (below threshold for rx) She denies falls Past Medical History:  Diagnosis Date  . COUGH DUE TO ACE INHIBITORS 11/25/2009   Qualifier: Diagnosis of  By: Everardo AllEllison MD, Cleophas DunkerSean A   . Depression   . DIABETES MELLITUS, TYPE II 10/13/2007   Qualifier: Diagnosis of  By: Charlsie QuestBrand RMA, Lucy    . HYPERLIPIDEMIA 10/13/2007   Qualifier: Diagnosis of  By: Tyrone AppleBrand RMA, Lucy    . HYPERTENSION 10/13/2007   Qualifier: Diagnosis of  By: Tyrone AppleBrand RMA, Lucy    . MENOPAUSAL SYNDROME 10/13/2007   Qualifier: Diagnosis of  By: Charlsie QuestBrand RMA, Lucy    . OSTEOPOROSIS 10/13/2007   Qualifier: Diagnosis of  By: Samara SnideBrand RMA, Lucy      Past Surgical History:  Procedure Laterality Date  . ABDOMINAL HYSTERECTOMY    . BREAST BIOPSY  08/2002  . TONSILLECTOMY      Social History   Socioeconomic History  . Marital  status: Married    Spouse name: Not on file  . Number of children: Not on file  . Years of education: Not on file  . Highest education level: Not on file  Occupational History  . Occupation: Retired   Engineer, productionocial Needs  . Financial resource strain: Not on file  . Food insecurity:    Worry: Not on file    Inability: Not on file  . Transportation needs:    Medical: Not on file    Non-medical: Not on file  Tobacco Use  . Smoking status: Never Smoker  . Smokeless tobacco: Never Used  Substance and Sexual Activity  . Alcohol use: No  . Drug use: No  . Sexual activity: Yes  Lifestyle  . Physical activity:    Days per week: Not on file    Minutes per session: Not on file  . Stress: Not on file  Relationships  . Social connections:    Talks on phone: Not on file    Gets together: Not on file    Attends religious service: Not on file    Active member of club or organization: Not on file    Attends meetings of clubs or organizations: Not on file    Relationship status: Not on file  . Intimate partner violence:    Fear of current or ex  partner: Not on file    Emotionally abused: Not on file    Physically abused: Not on file    Forced sexual activity: Not on file  Other Topics Concern  . Not on file  Social History Narrative  . Not on file    Current Outpatient Medications on File Prior to Visit  Medication Sig Dispense Refill  . escitalopram (LEXAPRO) 10 MG tablet TAKE 1/2 TABLET DAILY FOR ONE WEEK, THEN 1 TABLET DAILY 90 tablet 0  . glucose blood (FREESTYLE LITE) test strip 1 each by Other route daily. Use as instructed 100 each 3  . Multiple Vitamin (THERA) TABS Take by mouth.    . OLANZapine (ZYPREXA) 2.5 MG tablet Take 1 tablet (2.5 mg total) by mouth at bedtime. 90 tablet 0  . Cholecalciferol (VITAMIN D) 50 MCG (2000 UT) tablet Take 2,000 Units by mouth daily.    Marland Kitchen losartan (COZAAR) 100 MG tablet Take 1 tablet (100 mg total) by mouth daily. 90 tablet 1  . rosuvastatin  (CRESTOR) 40 MG tablet Take 1 tablet (40 mg total) by mouth daily. 90 tablet 1   No current facility-administered medications on file prior to visit.     Allergies  Allergen Reactions  . Lovastatin     REACTION: Rash    Family History  Problem Relation Age of Onset  . Anxiety disorder Mother   . Hyperparathyroidism Sister   . Cancer Neg Hx   . Breast cancer Neg Hx      Review of Systems Denies muscle weakness and numbness    Objective:   Physical Exam    Lab Results  Component Value Date   HGBA1C 6.8 (A) 02/22/2019   Lab Results  Component Value Date   PTH 44 03/16/2019   CALCIUM 10.5 03/16/2019   CALCIUM 10.6 (H) 03/16/2019   PHOS 2.7 03/16/2019      Assessment & Plan:  Type 2 DM: well-controlled Primary hyperparathyroidism: stable Osteoporosis: we discussed that is mild, and needs no rx now  Patient Instructions  Please continue the same vitamin-D. check your blood sugar twice per week.  vary the time of day when you check, between before the 3 meals, and at bedtime.  also check if you have symptoms of your blood sugar being too high or too low.  please keep a record of the readings and bring it to your next appointment here (or you can bring the meter itself).  You can write it on any piece of paper.  please call us sooner if your blood sugar goes below 70, or if you have a lot of readings over 200.  No treatment is needed for the osteoporosis.   Please come back for a follow-up appointment in 6 months.

## 2019-04-19 NOTE — Progress Notes (Signed)
Brandi Walsh is a 73 y.o. female with the following history as recorded in [employer]:  Patient Active Problem List   Diagnosis Date Noted  . Hypercalcemia 03/15/2019  . Major depression in full remission (HCC) 11/24/2018  . Memory loss 12/24/2016  . Wellness examination 06/13/2013  . Hyperparathyroidism, primary (HCC) 06/12/2013  . HTN (hypertension) 02/19/2012  . GERD (gastroesophageal reflux disease) 02/19/2012  . 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  . COUGH DUE TO ACE INHIBITORS 11/25/2009  . Diabetes (HCC) 10/13/2007  . Dyslipidemia 10/13/2007  . Essential hypertension 10/13/2007  . MENOPAUSAL SYNDROME 10/13/2007  . Osteoporosis 10/13/2007    Current Outpatient Medications  Medication Sig Dispense Refill  . Cholecalciferol (VITAMIN D) 50 MCG (2000 UT) tablet Take 2,000 Units by mouth daily.    Marland Kitchen. escitalopram (LEXAPRO) 10 MG tablet TAKE 1/2 TABLET DAILY FOR ONE WEEK, THEN 1 TABLET DAILY 90 tablet 0  . glucose blood (FREESTYLE LITE) test strip 1 each by Other route daily. Use as instructed 100 each 3  . losartan (COZAAR) 100 MG tablet Take 1 tablet (100 mg total) by mouth daily. 90 tablet 1  . Multiple Vitamin (THERA) TABS Take by mouth.    . OLANZapine (ZYPREXA) 2.5 MG tablet Take 1 tablet (2.5 mg total) by mouth at bedtime. 90 tablet 0  . rosuvastatin (CRESTOR) 40 MG tablet Take 1 tablet (40 mg total) by mouth daily. 90 tablet 1   No current facility-administered medications for this visit.     Allergies: Lovastatin  Past Medical History:  Diagnosis Date  . COUGH DUE TO ACE INHIBITORS 11/25/2009   Qualifier: Diagnosis of  By: Everardo AllEllison MD, Cleophas DunkerSean A   . Depression   . DIABETES MELLITUS, TYPE II 10/13/2007   Qualifier: Diagnosis of  By: Charlsie QuestBrand RMA, Lucy    . HYPERLIPIDEMIA 10/13/2007   Qualifier: Diagnosis of  By: Tyrone AppleBrand RMA, Lucy    . HYPERTENSION 10/13/2007   Qualifier: Diagnosis of  By: Tyrone AppleBrand RMA, Lucy    . MENOPAUSAL SYNDROME 10/13/2007   Qualifier: Diagnosis of  By: Charlsie QuestBrand RMA,  Lucy    . OSTEOPOROSIS 10/13/2007   Qualifier: Diagnosis of  By: Samara SnideBrand RMA, Lucy      Past Surgical History:  Procedure Laterality Date  . ABDOMINAL HYSTERECTOMY    . BREAST BIOPSY  08/2002  . TONSILLECTOMY      Family History  Problem Relation Age of Onset  . Anxiety disorder Mother   . Hyperparathyroidism Sister   . Cancer Neg Hx   . Breast cancer Neg Hx     Social History   Tobacco Use  . Smoking status: Never Smoker  . Smokeless tobacco: Never Used  Substance Use Topics  . Alcohol use: No    Subjective:    I connected with Brandi Walsh on 04/19/19 at 10:40 AM EDT by a video enabled telemedicine application and verified that I am speaking with the correct person using two identifiers. Patient, her granddaughter and I are on the video call.    I discussed the limitations of evaluation and management by telemedicine and the availability of in person appointments. The patient expressed understanding and agreed to proceed.  Transferring care from Dr. Everardo AllEllison; needs to get primary care needs up to date; currently under care of endocrine for hypercalcemia/ Type 2 diabetes; working with psychiatric nurse practitioner once/ month for chronic depression; mentions that her weight has gotten down to 132 lbs but notes appetite has been improving and feels like she is doing better.  Needs  to get mammogram order updated/ patient prefers to do every other year; DEXA done in 2019- severe osteopenia/ plan to re-check in 2021;     Objective:  There were no vitals filed for this visit.  General: Well developed, well nourished, in no acute distress  Head: Normocephalic and atraumatic  Lungs: Respirations unlabored;  Neurologic: Alert and oriented; speech intact; face symmetrical;   Assessment:  1. Essential hypertension   2. Screening mammogram, encounter for   3. Dyslipidemia   4. Type 2 diabetes mellitus without complication, without long-term current use of insulin (HCC)   5.  Hyperparathyroidism, primary (HCC)   6. Recurrent major depressive disorder, in full remission (HCC)   7. Osteoporosis without current pathological fracture, unspecified osteoporosis type     Plan:  1. Continue Losartan 100 mg qd; follow-up in office in 3 months; 2. Order updated- patient prefer to do her mammograms every other year; 3. Continue Crestor 40 mg- plan to update lipid panel at next OV; 4. Stable- last Hgba1c at 6.8; diet controlled;  5. Continue with endocrine as scheduled; 6. Stable; continue with psychiatric nurse practitioner as scheduled; 7. Patient is to clarify how much calcium she can take due to history of hypercalcemia; will ask endocrinologist later today.   No follow-ups on file.  Orders Placed This Encounter  Procedures  . MM Digital Screening    Standing Status:   Future    Standing Expiration Date:   06/18/2020    Order Specific Question:   Reason for Exam (SYMPTOM  OR DIAGNOSIS REQUIRED)    Answer:   screening mammogram    Order Specific Question:   Preferred imaging location?    Answer:   [facility]    Requested Prescriptions   Signed Prescriptions Disp Refills  . losartan (COZAAR) 100 MG tablet 90 tablet 1    Sig: Take 1 tablet (100 mg total) by mouth daily.  . rosuvastatin (CRESTOR) 40 MG tablet 90 tablet 1    Sig: Take 1 tablet (40 mg total) by mouth daily.

## 2019-04-19 NOTE — Patient Instructions (Addendum)
Please continue the same vitamin-D. check your blood sugar twice per week.  vary the time of day when you check, between before the 3 meals, and at bedtime.  also check if you have symptoms of your blood sugar being too high or too low.  please keep a record of the readings and bring it to your next appointment here (or you can bring the meter itself).  You can write it on any piece of paper.  please call us sooner if your blood sugar goes below 70, or if you have a lot of readings over 200.  No treatment is needed for the osteoporosis.   Please come back for a follow-up appointment in 6 months.

## 2019-04-27 ENCOUNTER — Other Ambulatory Visit: Payer: Self-pay

## 2019-04-27 ENCOUNTER — Telehealth: Payer: Self-pay

## 2019-04-27 MED ORDER — LOSARTAN POTASSIUM 100 MG PO TABS: 100.0000 mg | ORAL_TABLET | Freq: Every day | ORAL | 1 refills | Status: DC

## 2019-04-27 NOTE — Telephone Encounter (Signed)
Copied from CRM [id number]. Topic: General - Inquiry >> Apr 27, 2019  1:46 PM Reggie Pile, [place] wrote: Reason for CRM: Patient is calling in stating another pharmacy may be needed to refill prescription, losartan, due to current believing that they may not able to refill it in time. Call back is [phone].

## 2019-05-02 ENCOUNTER — Ambulatory Visit (INDEPENDENT_AMBULATORY_CARE_PROVIDER_SITE_OTHER): Payer: Medicare Other | Admitting: Psychiatry

## 2019-05-02 ENCOUNTER — Encounter: Payer: Self-pay | Admitting: Psychiatry

## 2019-05-02 ENCOUNTER — Other Ambulatory Visit: Payer: Self-pay

## 2019-05-02 DIAGNOSIS — F419 Anxiety disorder, unspecified: Secondary | ICD-10-CM

## 2019-05-02 DIAGNOSIS — F23 Brief psychotic disorder: Secondary | ICD-10-CM

## 2019-05-02 DIAGNOSIS — F324 Major depressive disorder, single episode, in partial remission: Secondary | ICD-10-CM | POA: Diagnosis not present

## 2019-05-02 MED ORDER — OLANZAPINE 2.5 MG PO TABS: 2.5000 mg | ORAL_TABLET | Freq: Every day | ORAL | 0 refills | Status: DC

## 2019-05-02 MED ORDER — ESCITALOPRAM OXALATE 10 MG PO TABS: 10.0000 mg | ORAL_TABLET | Freq: Every day | ORAL | 0 refills | Status: DC

## 2019-05-02 NOTE — Progress Notes (Signed)
Brandi Walsh [id number] 12/15/1945 73 y.o.  Virtual Visit via Video Note  I connected with@ on 05/02/19 at 11:30 AM EDT by a video enabled telemedicine application and verified that I am speaking with the correct person using two identifiers.   I discussed the limitations of evaluation and management by telemedicine and the availability of in person appointments. The patient expressed understanding and agreed to proceed.  I discussed the assessment and treatment plan with the patient. The patient was provided an opportunity to ask questions and all were answered. The patient agreed with the plan and demonstrated an understanding of the instructions.   The patient was advised to call back or seek an in-person evaluation if the symptoms worsen or if the condition fails to improve as anticipated.  I provided 30 minutes of non-face-to-face time during this encounter.  The patient was located at home.  The provider was located at [facility].   [name], PMHNP   Subjective:   Patient ID:  Brandi Walsh is a 73 y.o. (DOB 04/08/1946) female.  Chief Complaint:  Chief Complaint  Patient presents with  . Follow-up    h/o Mental Status change, Anxiety, and depression    HPI SKAI LICKTEIG presents for follow-up of depression and anxiety. Her granddaughter [name] is also present for video At last visit Remeron was d/c'd and Lexapro was started. Pt was also encouraged to f/u with PCP re: recent changes in mental status and to r/o medical causes. Granddaughter reports that pt had a parathyroid deficiency. Granddaughter reports that pt's calcium and sodium levels returned to normal without treatment.   Pt reports "I'm doing very well." She reports that her mood is stable. Denies depressed mood. She denies anxiety or feeling nervous. Occ worry- "but I can shake it." She reports that her appetite has increased. She reports that she sleeps very well. Denies nightmares. Reports that  her energy and motivation have improved. She denies any recent confusion or disorientation. Denies SI. Denies AH or VH. Denies paranoia.   Granddaughter reports that pt's mood and anxiety improved with Lexapro. She reports that they have been giving her protein supplements. Granddaugher reports that pt's LT memory has improved. Granddaughter reports that pt's mental status has improved. She reports that pt's gait has improved and she is no longer unsteady. [name] reports that she has not seen any possible s/s of psychosis. [name] reports that pt seems to be bothered by not being able to be more mobile due to pandemic.   Pt reports that she has periodic tongue/jaw movements and that it may be related to her dentures. Granddaughter denies any evidence of involuntary movements.   Past Psychiatric Medication Trials: Lexapro Cymbalta Risperdal Olanzapine Remeron- Vivid dreams, nightmares  Review of Systems:  Review of Systems  Musculoskeletal: Negative for gait problem.  Neurological: Negative for tremors.  Psychiatric/Behavioral:       Please refer to HPI    Medications: I have reviewed the patient's current medications.  Current Outpatient Medications  Medication Sig Dispense Refill  . Cholecalciferol (VITAMIN D) 50 MCG (2000 UT) tablet Take 2,000 Units by mouth daily.    Marland Kitchen escitalopram (LEXAPRO) 10 MG tablet Take 1 tablet (10 mg total) by mouth daily. 90 tablet 0  . losartan (COZAAR) 100 MG tablet Take 1 tablet (100 mg total) by mouth daily. 90 tablet 1  . Multiple Vitamin (THERA) TABS Take by mouth.    . OLANZapine (ZYPREXA) 2.5 MG tablet Take 1 tablet (2.5 mg  total) by mouth at bedtime. 90 tablet 0  . rosuvastatin (CRESTOR) 40 MG tablet Take 1 tablet (40 mg total) by mouth daily. 90 tablet 1  . glucose blood (FREESTYLE LITE) test strip 1 each by Other route daily. Use as instructed 100 each 3   No current facility-administered medications for this visit.     Medication Side  Effects: Other: Increased appetite  Allergies:  Allergies  Allergen Reactions  . Lovastatin     REACTION: Rash    Past Medical History:  Diagnosis Date  . COUGH DUE TO ACE INHIBITORS 11/25/2009   Qualifier: Diagnosis of  By: Everardo All MD, Cleophas Dunker   . Depression   . DIABETES MELLITUS, TYPE II 10/13/2007   Qualifier: Diagnosis of  By: Charlsie Quest RMA, Lucy    . HYPERLIPIDEMIA 10/13/2007   Qualifier: Diagnosis of  By: Tyrone Apple, Lucy    . HYPERTENSION 10/13/2007   Qualifier: Diagnosis of  By: Tyrone Apple, Lucy    . MENOPAUSAL SYNDROME 10/13/2007   Qualifier: Diagnosis of  By: Charlsie Quest RMA, Lucy    . OSTEOPOROSIS 10/13/2007   Qualifier: Diagnosis of  By: Samara Snide      Family History  Problem Relation Age of Onset  . Anxiety disorder Mother   . Hyperparathyroidism Sister   . Cancer Neg Hx   . Breast cancer Neg Hx     Social History   Socioeconomic History  . Marital status: Married    Spouse name: Not on file  . Number of children: Not on file  . Years of education: Not on file  . Highest education level: Not on file  Occupational History  . Occupation: Retired   Engineer, production  . Financial resource strain: Not on file  . Food insecurity:    Worry: Not on file    Inability: Not on file  . Transportation needs:    Medical: Not on file    Non-medical: Not on file  Tobacco Use  . Smoking status: Never Smoker  . Smokeless tobacco: Never Used  Substance and Sexual Activity  . Alcohol use: No  . Drug use: No  . Sexual activity: Yes  Lifestyle  . Physical activity:    Days per week: Not on file    Minutes per session: Not on file  . Stress: Not on file  Relationships  . Social connections:    Talks on phone: Not on file    Gets together: Not on file    Attends religious service: Not on file    Active member of club or organization: Not on file    Attends meetings of clubs or organizations: Not on file    Relationship status: Not on file  . Intimate partner violence:     Fear of current or ex partner: Not on file    Emotionally abused: Not on file    Physically abused: Not on file    Forced sexual activity: Not on file  Other Topics Concern  . Not on file  Social History Narrative  . Not on file    Past Medical History, Surgical history, Social history, and Family history were reviewed and updated as appropriate.   Please see review of systems for further details on the patient's review from today.   Objective:   Physical Exam:  There were no vitals taken for this visit.  Physical Exam Neurological:     Mental Status: She is alert and oriented to person, place, and time.  Cranial Nerves: No dysarthria.  Psychiatric:        Attention and Perception: Attention normal.        Mood and Affect: Mood normal.        Speech: Speech normal.        Behavior: Behavior is cooperative.        Thought Content: Thought content normal. Thought content is not paranoid or delusional. Thought content does not include homicidal or suicidal ideation. Thought content does not include homicidal or suicidal plan.        Cognition and Memory: Cognition and memory normal.        Judgment: Judgment normal.     Lab Review:     Component Value Date/Time   NA 136 03/16/2019 0819   K 4.1 03/16/2019 0819   CL 102 03/16/2019 0819   CO2 28 03/16/2019 0819   GLUCOSE 132 (H) 03/16/2019 0819   BUN 10 03/16/2019 0819   CREATININE 0.74 03/16/2019 0819   CREATININE 0.82 02/13/2019 1031   CALCIUM 10.5 03/16/2019 0819   CALCIUM 10.6 (H) 03/16/2019 0819   CALCIUM 10.8 (H) 02/19/2012 1129   PROT 7.0 03/16/2019 0819   ALBUMIN 4.0 03/16/2019 0819   AST 20 03/16/2019 0819   ALT 27 03/16/2019 0819   ALKPHOS 85 03/16/2019 0819   BILITOT 0.8 03/16/2019 0819   GFRNONAA >60 01/15/2017 0941   GFRAA >60 01/15/2017 0941       Component Value Date/Time   WBC 11.9 (H) 05/24/2018 1336   RBC 4.75 05/24/2018 1336   HGB 15.0 05/24/2018 1336   HGB 15.3 12/16/2010 1045   HCT  42.2 05/24/2018 1336   HCT 43.8 12/16/2010 1045   PLT 305.0 05/24/2018 1336   PLT 224 12/16/2010 1045   MCV 88.9 05/24/2018 1336   MCV 88.9 12/16/2010 1045   MCH 32.6 01/15/2017 0941   MCHC 35.6 05/24/2018 1336   RDW 12.8 05/24/2018 1336   RDW 12.5 12/16/2010 1045   LYMPHSABS 1.8 05/24/2018 1336   LYMPHSABS 2.1 12/16/2010 1045   MONOABS 0.6 05/24/2018 1336   MONOABS 0.8 12/16/2010 1045   EOSABS 0.0 05/24/2018 1336   EOSABS 0.3 12/16/2010 1045   BASOSABS 0.1 05/24/2018 1336   BASOSABS 0.1 12/16/2010 1045    No results found for: POCLITH, LITHIUM   No results found for: PHENYTOIN, PHENOBARB, VALPROATE, CBMZ   .res Assessment: Plan:   Patient seen for 30 minutes and greater than 50% of visit spent counseling patient and granddaughter and coordinating care, to include discussing that mental status changes have seemed to correlate with abnormalities in sodium and calcium levels in the past.  Agree with plan to continue to work with primary care to monitor medical issues.  Discussed obtaining a metabolic panel if/when patient exhibits mental status changes in the future.  Discussed potential metabolic and movement side effects with Zyprexa and discussed option of lowering Zyprexa to 2.5 mg one half tab at bedtime since psychotic signs and symptoms may have been due to underlying medical causes in the past..  Patient and granddaughter agree that benefits outweigh potential risks at this time since patient reports that Zyprexa has been very helpful with her sleep and mood. Will continue Zyprexa 2.5 mg at bedtime due to history of psychosis and for insomnia and mood. Continue Lexapro 10 mg daily for mood and anxiety. Discussed option of seeing a therapist to address chronic anxiety and worry.  Patient reports that she would like more time to consider this. Patient to  follow-up in 3 months or sooner if clinically indicated. Patient advised to contact office with any questions, adverse effects,  or acute worsening in signs and symptoms.  Major depressive disorder in partial remission, unspecified whether recurrent (HCC) - Plan: escitalopram (LEXAPRO) 10 MG tablet  Anxiety disorder, unspecified type - Plan: escitalopram (LEXAPRO) 10 MG tablet  Brief psychotic disorder (HCC) - Plan: OLANZapine (ZYPREXA) 2.5 MG tablet  Please see After Visit Summary for patient specific instructions.  Future Appointments  Date Time Provider Department Center  07/18/2019  9:20 AM [name], FNP [facility]    No orders of the defined types were placed in this encounter.     -------------------------------

## 2019-06-08 ENCOUNTER — Telehealth: Payer: Self-pay | Admitting: Endocrinology

## 2019-06-08 NOTE — Telephone Encounter (Signed)
Spoke with patients granddaughter in concerns of the switch.She began to explain to me the psychiatric problems the patient has been having. She explained her levels have been low and the patients psychologist explains that is the cause for psychiatric. The granddaughter does feel that there was not any concern to this at her last visit.   I have spoke with the patients granddaughter before and explained that Dr.Ellison is no longer a PCP and will have to establish with someone.

## 2019-06-08 NOTE — Telephone Encounter (Signed)
-----   Message from Cloyd Stagers, MD sent at 06/08/2019 11:10 AM EDT ----- Regarding: RE: Change Providers Decline for me please . She has been with Dr. [name] for so long with stable and well controlled health conditions. There's nothing additional I will be able to offer her.    ----- Message ----- From: Risa Grill Sent: 06/07/2019   3:19 PM EDT To: Renato Shin, MD, # Subject: Change Providers                               Patients granddaughter Brandi Walsh has called requesting patient to switch from Brandi Walsh to [name].  Please Advise, Thanks!

## 2019-06-21 ENCOUNTER — Other Ambulatory Visit: Payer: Self-pay | Admitting: Family

## 2019-06-21 ENCOUNTER — Telehealth: Payer: Self-pay

## 2019-06-21 DIAGNOSIS — E210 Primary hyperparathyroidism: Secondary | ICD-10-CM

## 2019-06-21 NOTE — Telephone Encounter (Signed)
Copied from [place] [phone]. Topic: Referral - Request for Referral >> Jun 21, 2019  9:38 AM Nils Flack wrote: Has patient seen PCP for this complaint? Yes.   *If NO, is insurance requiring patient see PCP for this issue before PCP can refer them? Referral for which specialty: endo Preferred provider/office:  Reason for referral: parathyroid hormone dysfunction, calcium and electrolyte imbalance that leads to psychiatric disturbances

## 2019-06-28 ENCOUNTER — Telehealth: Payer: Self-pay | Admitting: Family

## 2019-06-28 NOTE — Telephone Encounter (Signed)
Copied from [place] [phone]. Topic: General - Other >> Jun 28, 2019  3:09 PM Keene Breath wrote: Reason for CRM: Patient's daughter called to check the status of her mother's referral.  Please call Asencion Partridge back at [phone]

## 2019-06-29 NOTE — Telephone Encounter (Signed)
Pt is scheduled at [facility]

## 2019-07-18 ENCOUNTER — Ambulatory Visit: Payer: Medicare Other | Admitting: Family

## 2019-07-31 ENCOUNTER — Telehealth: Payer: Self-pay | Admitting: Psychiatry

## 2019-07-31 NOTE — Telephone Encounter (Signed)
Granddaughter called back saying that her Tilden Dome says Malisa is better later in the day.  May not need labs.  Have appt 08/08/19 so can determine then.  Seems she gets anxious and panic when she gets reminder calls about appointments because she worries how she is going to get them, etc.  So in the end, she says her Cory Roughen is ok, but we should call Asencion Partridge for appt reminders to take that trigger away.  However, we don't have a HIPPA form signed so we really can't do that until the form is signed.

## 2019-07-31 NOTE — Telephone Encounter (Signed)
Grandaughter Brandi Walsh stated her grandmother is off again, pacing, anxious and looked lost and would like for you to request lab work to get electrolytes tested

## 2019-08-01 NOTE — Telephone Encounter (Signed)
Noted for patient to get release form signed at next office visit for granddaughter

## 2019-08-08 ENCOUNTER — Encounter: Payer: Self-pay | Admitting: Family

## 2019-08-08 ENCOUNTER — Ambulatory Visit (INDEPENDENT_AMBULATORY_CARE_PROVIDER_SITE_OTHER): Payer: Medicare Other | Admitting: Family

## 2019-08-08 ENCOUNTER — Ambulatory Visit (INDEPENDENT_AMBULATORY_CARE_PROVIDER_SITE_OTHER): Payer: Medicare Other | Admitting: Psychiatry

## 2019-08-08 ENCOUNTER — Encounter: Payer: Self-pay | Admitting: Psychiatry

## 2019-08-08 ENCOUNTER — Other Ambulatory Visit (INDEPENDENT_AMBULATORY_CARE_PROVIDER_SITE_OTHER): Payer: Medicare Other

## 2019-08-08 ENCOUNTER — Other Ambulatory Visit: Payer: Self-pay

## 2019-08-08 VITALS — BP 118/74 | HR 62 | Temp 98.3°F | Ht 62.0 in | Wt 126.0 lb

## 2019-08-08 DIAGNOSIS — E119 Type 2 diabetes mellitus without complications: Secondary | ICD-10-CM

## 2019-08-08 DIAGNOSIS — M810 Age-related osteoporosis without current pathological fracture: Secondary | ICD-10-CM | POA: Diagnosis not present

## 2019-08-08 DIAGNOSIS — E210 Primary hyperparathyroidism: Secondary | ICD-10-CM

## 2019-08-08 DIAGNOSIS — F419 Anxiety disorder, unspecified: Secondary | ICD-10-CM

## 2019-08-08 DIAGNOSIS — E785 Hyperlipidemia, unspecified: Secondary | ICD-10-CM | POA: Diagnosis not present

## 2019-08-08 DIAGNOSIS — F23 Brief psychotic disorder: Secondary | ICD-10-CM

## 2019-08-08 DIAGNOSIS — F324 Major depressive disorder, single episode, in partial remission: Secondary | ICD-10-CM | POA: Diagnosis not present

## 2019-08-08 DIAGNOSIS — I10 Essential (primary) hypertension: Secondary | ICD-10-CM

## 2019-08-08 LAB — LIPID PANEL
Cholesterol: 104 mg/dL (ref 0–200)
HDL: 42 mg/dL (ref >39.00)
LDL Cholesterol: 51 mg/dL (ref 0–99)
NonHDL: 61.76
Total CHOL/HDL Ratio: 2
Triglycerides: 55 mg/dL (ref 0.0–149.0)
VLDL: 11 mg/dL (ref 0.0–40.0)

## 2019-08-08 LAB — COMPREHENSIVE METABOLIC PANEL
ALT: 100 U/L — ABNORMAL HIGH (ref 0–35)
AST: 47 U/L — ABNORMAL HIGH (ref 0–37)
Albumin: 4.4 g/dL (ref 3.5–5.2)
Alkaline Phosphatase: 94 U/L (ref 39–117)
BUN: 13 mg/dL (ref 6–23)
CO2: 27 mEq/L (ref 19–32)
Calcium: 10.7 mg/dL — ABNORMAL HIGH (ref 8.4–10.5)
Chloride: 100 mEq/L (ref 96–112)
Creatinine, Ser: 0.64 mg/dL (ref 0.40–1.20)
GFR: 90.81 mL/min (ref >60.00)
Glucose, Bld: 125 mg/dL — ABNORMAL HIGH (ref 70–99)
Potassium: 3.8 mEq/L (ref 3.5–5.1)
Sodium: 135 mEq/L (ref 135–145)
Total Bilirubin: 1.5 mg/dL — ABNORMAL HIGH (ref 0.2–1.2)
Total Protein: 7.6 g/dL (ref 6.0–8.3)

## 2019-08-08 LAB — HEMOGLOBIN A1C: Hgb A1c MFr Bld: 5.9 % (ref 4.6–6.5)

## 2019-08-08 MED ORDER — ESCITALOPRAM OXALATE 10 MG PO TABS: 10.0000 mg | ORAL_TABLET | Freq: Every day | ORAL | 0 refills | Status: DC

## 2019-08-08 MED ORDER — OLANZAPINE 2.5 MG PO TABS: 2.5000 mg | ORAL_TABLET | Freq: Every day | ORAL | 0 refills | Status: DC

## 2019-08-08 NOTE — Progress Notes (Signed)
Brandi Walsh is a 73 y.o. female with the following history as recorded in EpicCare:  Patient Active Problem List   Diagnosis Date Noted  . Hypercalcemia 03/15/2019  . Major depression in full remission ([name]) 11/24/2018  . Memory loss 12/24/2016  . Wellness examination 06/13/2013  . Hyperparathyroidism, primary ([name]) 06/12/2013  . HTN (hypertension) 02/19/2012  . GERD (gastroesophageal reflux disease) 02/19/2012  . 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  . COUGH DUE TO ACE INHIBITORS 11/25/2009  . Diabetes ([name]) 10/13/2007  . Dyslipidemia 10/13/2007  . Essential hypertension 10/13/2007  . MENOPAUSAL SYNDROME 10/13/2007  . Osteoporosis 10/13/2007    Current Outpatient Medications  Medication Sig Dispense Refill  . Cholecalciferol (VITAMIN D) 50 MCG (2000 UT) tablet Take 2,000 Units by mouth daily.    Marland Kitchen escitalopram (LEXAPRO) 10 MG tablet Take 1 tablet (10 mg total) by mouth daily. 90 tablet 0  . glucose blood (FREESTYLE LITE) test strip 1 each by Other route daily. Use as instructed 100 each 3  . losartan (COZAAR) 100 MG tablet Take 1 tablet (100 mg total) by mouth daily. 90 tablet 1  . Multiple Vitamin (THERA) TABS Take by mouth.    . OLANZapine (ZYPREXA) 2.5 MG tablet Take 1 tablet (2.5 mg total) by mouth at bedtime. 90 tablet 0  . rosuvastatin (CRESTOR) 40 MG tablet Take 1 tablet (40 mg total) by mouth daily. 90 tablet 1   No current facility-administered medications for this visit.     Allergies: Lovastatin  Past Medical History:  Diagnosis Date  . COUGH DUE TO ACE INHIBITORS 11/25/2009   Qualifier: Diagnosis of  By: [name] MD, [name]   . Depression   . DIABETES MELLITUS, TYPE II 10/13/2007   Qualifier: Diagnosis of  By: Marca Ancona RMA, Lucy    . HYPERLIPIDEMIA 10/13/2007   Qualifier: Diagnosis of  By: Reatha Armour, Lucy    . HYPERTENSION 10/13/2007   Qualifier: Diagnosis of  By: Reatha Armour, Lucy    . MENOPAUSAL SYNDROME 10/13/2007   Qualifier: Diagnosis of  By: Marca Ancona RMA, Lucy    .  OSTEOPOROSIS 10/13/2007   Qualifier: Diagnosis of  By: Larose Kells      Past Surgical History:  Procedure Laterality Date  . ABDOMINAL HYSTERECTOMY    . BREAST BIOPSY  08/2002  . TONSILLECTOMY      Family History  Problem Relation Age of Onset  . Anxiety disorder Mother   . Hyperparathyroidism Sister   . Cancer Neg Hx   . Breast cancer Neg Hx     Social History   Tobacco Use  . Smoking status: Never Smoker  . Smokeless tobacco: Never Used  Substance Use Topics  . Alcohol use: No    Subjective:   Patient presents for 3 month follow-up on chronic care needs including hypertension, hyperlipidemia; accompanied by her granddaughter today who helps to provide history; no acute concerns today; in baseline state of health; Denies any chest pain, shortness of breath, blurred vision or headache.  Has endocrinologist managing hyperparathyroidism; working with psychiatric NP as well- feels very good on combination of Lexapro and Seroquel;      Objective:  Vitals:   08/08/19 1237  BP: 118/74  Pulse: 62  Temp: 98.3 F (36.8 C)  TempSrc: Oral  SpO2: 97%  Weight: 126 lb (57.2 kg)  Height: 5' 2"  (1.575 m)    General: Well developed, well nourished, in no acute distress  Skin : Warm and dry.  Head: Normocephalic and atraumatic  Lungs: Respirations unlabored;  clear to auscultation bilaterally without wheeze, rales, rhonchi  CVS exam: normal rate and regular rhythm.  Neurologic: Alert and oriented; speech intact; face symmetrical; moves all extremities well; CNII-XII intact without focal deficit   Assessment:  1. Essential hypertension   2. Type 2 diabetes mellitus without complication, without long-term current use of insulin ([place])   3. Dyslipidemia   4. Osteoporosis without current pathological fracture, unspecified osteoporosis type   5. Hyperparathyroidism, primary ([place])     Plan:  Stable; refills are up to date- continue same medications; check CBC, CMP, lipid panel  today; Will plan for mammogram and DEXA in 2021 per patient request.  Continue with endocrinology as scheduled.   No follow-ups on file.  Orders Placed This Encounter  Procedures  . Lipid panel    Standing Status:   Future    Number of Occurrences:   1    Standing Expiration Date:   08/07/2020  . Comp Met (CMET)    Standing Status:   Future    Number of Occurrences:   1    Standing Expiration Date:   08/07/2020  . HgB A1c    Standing Status:   Future    Number of Occurrences:   1    Standing Expiration Date:   08/07/2020    Requested Prescriptions    No prescriptions requested or ordered in this encounter

## 2019-08-08 NOTE — Progress Notes (Signed)
   08/08/19 1055  Facial and Oral Movements  Muscles of Facial Expression 0  Lips and Perioral Area 0  Jaw 1  Tongue 0  Extremity Movements  Upper (arms, wrists, hands, fingers) 0  Lower (legs, knees, ankles, toes) 0  Trunk Movements  Neck, shoulders, hips 0  Overall Severity  Severity of abnormal movements (highest score from questions above) 0  Incapacitation due to abnormal movements 0  Patient's awareness of abnormal movements (rate only patient's report) 0  Dental Status  Current problems with teeth and/or dentures? Yes  Does patient usually wear dentures? Yes  AIMS Total Score  AIMS Total Score 1

## 2019-08-08 NOTE — Patient Instructions (Signed)
Suezanne Cheshire, LCSW [phone]  Location [place], [place] [address] [place], [place] [address]

## 2019-08-08 NOTE — Progress Notes (Signed)
Brandi Walsh [id number] 1946-09-12 73 y.o.  Subjective:   Patient ID:  Brandi Walsh is a 73 y.o. (DOB December 11, 1945) female.  Chief Complaint:  Chief Complaint  Patient presents with  . Follow-up    h/o Anxiety, Depression, and psychosis    HPI Brandi Walsh presents to the office today for follow-up of history of depression, anxiety, and psychosis.  Accompanied by her granddaughter, [name]. They report that overall there have not been an significant changes. Granddaughter describes some overall changes that may be age related. Granddaughter reports that pt has certain anxiety triggers to include medical appointments and family. Pt reports occ rumination. She reports that she tends to have less anxiety when she stays busy. Denies physical s/s with anxiety. They report dental issues were a cause of stress for her. She reports that dental issues have interfered some with eating. Some weight loss and decreased appetite. She reports that Lexapro causes some increase in appetite. She reports that her appetite is usually better later in the day and has no appetite in the morning. Starts eating around lunch. Granddaughter reports that pt has not had the desire to cook or to eat. Denies sad mood. Describes herself as "lacadasical" and is "envious of movers and shakers." Initially reports energy and motivation is ok and then states it may be low. Reports that she "may be stuck in a rut." Denies affective dulling. She reports sleep is "wonderful." She reports adequate concentration. Granddaughter report that pt has always had a good LT memory and occ has some days that are better than others and has more difficulty with memory on some days. Denies SI.   They deny any recent delusions.   Granddaughter reports that pt seems to have "nervous tendencies" and will shake when nervous.   Past Psychiatric Medication Trials: Lexapro Cymbalta Risperdal Olanzapine Remeron- Vivid dreams,  nightmares  Review of Systems:  Review of Systems  HENT: Positive for dental problem.        Has upper and lower partials.   Endocrine:       Saw endocrinologist with [facility].     Medications: I have reviewed the patient's current medications.  Current Outpatient Medications  Medication Sig Dispense Refill  . Cholecalciferol (VITAMIN D) 50 MCG (2000 UT) tablet Take 2,000 Units by mouth daily.    Marland Kitchen losartan (COZAAR) 100 MG tablet Take 1 tablet (100 mg total) by mouth daily. 90 tablet 1  . Multiple Vitamin (THERA) TABS Take by mouth.    . rosuvastatin (CRESTOR) 40 MG tablet Take 1 tablet (40 mg total) by mouth daily. 90 tablet 1  . escitalopram (LEXAPRO) 10 MG tablet Take 1 tablet (10 mg total) by mouth daily. 90 tablet 0  . glucose blood (FREESTYLE LITE) test strip 1 each by Other route daily. Use as instructed 100 each 3  . OLANZapine (ZYPREXA) 2.5 MG tablet Take 1 tablet (2.5 mg total) by mouth at bedtime. 90 tablet 0   No current facility-administered medications for this visit.     Medication Side Effects: None  Allergies:  Allergies  Allergen Reactions  . Lovastatin     REACTION: Rash    Past Medical History:  Diagnosis Date  . COUGH DUE TO ACE INHIBITORS 11/25/2009   Qualifier: Diagnosis of  By: Everardo All MD, Cleophas Dunker   . Depression   . DIABETES MELLITUS, TYPE II 10/13/2007   Qualifier: Diagnosis of  By: Charlsie Quest RMA, Lucy    . HYPERLIPIDEMIA 10/13/2007   Qualifier:  Diagnosis of  By: Tyrone AppleBrand RMA, Lucy    . HYPERTENSION 10/13/2007   Qualifier: Diagnosis of  By: Tyrone AppleBrand RMA, Lucy    . MENOPAUSAL SYNDROME 10/13/2007   Qualifier: Diagnosis of  By: Charlsie QuestBrand RMA, Lucy    . OSTEOPOROSIS 10/13/2007   Qualifier: Diagnosis of  By: Samara SnideBrand RMA, Lucy      Family History  Problem Relation Age of Onset  . Anxiety disorder Mother   . Hyperparathyroidism Sister   . Cancer Neg Hx   . Breast cancer Neg Hx     Social History   Socioeconomic History  . Marital status:  Married    Spouse name: Not on file  . Number of children: Not on file  . Years of education: Not on file  . Highest education level: Not on file  Occupational History  . Occupation: Retired   Engineer, productionocial Needs  . Financial resource strain: Not on file  . Food insecurity    Worry: Not on file    Inability: Not on file  . Transportation needs    Medical: Not on file    Non-medical: Not on file  Tobacco Use  . Smoking status: Never Smoker  . Smokeless tobacco: Never Used  Substance and Sexual Activity  . Alcohol use: No  . Drug use: No  . Sexual activity: Yes  Lifestyle  . Physical activity    Days per week: Not on file    Minutes per session: Not on file  . Stress: Not on file  Relationships  . Social Musicianconnections    Talks on phone: Not on file    Gets together: Not on file    Attends religious service: Not on file    Active member of club or organization: Not on file    Attends meetings of clubs or organizations: Not on file    Relationship status: Not on file  . Intimate partner violence    Fear of current or ex partner: Not on file    Emotionally abused: Not on file    Physically abused: Not on file    Forced sexual activity: Not on file  Other Topics Concern  . Not on file  Social History Narrative  . Not on file    Past Medical History, Surgical history, Social history, and Family history were reviewed and updated as appropriate.   Please see review of systems for further details on the patient's review from today.   Objective:   Physical Exam:  Wt 126 lb (57.2 kg)   BMI 23.05 kg/m   Physical Exam Constitutional:      General: She is not in acute distress.    Appearance: She is well-developed.  Musculoskeletal:        General: No deformity.  Neurological:     Mental Status: She is alert and oriented to person, place, and time.     Coordination: Coordination normal.  Psychiatric:        Attention and Perception: Attention and perception normal. She does  not perceive auditory or visual hallucinations.        Mood and Affect: Mood is anxious. Mood is not depressed. Affect is not labile, blunt, angry or inappropriate.        Speech: Speech normal.        Behavior: Behavior normal.        Thought Content: Thought content normal. Thought content is not paranoid or delusional. Thought content does not include homicidal or suicidal ideation. Thought content does not  include homicidal or suicidal plan.        Cognition and Memory: Cognition and memory normal.        Judgment: Judgment normal.     Comments: Insight intact. No delusions.      Lab Review:     Component Value Date/Time   NA 135 08/08/2019 1256   K 3.8 08/08/2019 1256   CL 100 08/08/2019 1256   CO2 27 08/08/2019 1256   GLUCOSE 125 (H) 08/08/2019 1256   BUN 13 08/08/2019 1256   CREATININE 0.64 08/08/2019 1256   CREATININE 0.82 02/13/2019 1031   CALCIUM 10.7 (H) 08/08/2019 1256   CALCIUM 10.8 (H) 02/19/2012 1129   PROT 7.6 08/08/2019 1256   ALBUMIN 4.4 08/08/2019 1256   AST 47 (H) 08/08/2019 1256   ALT 100 (H) 08/08/2019 1256   ALKPHOS 94 08/08/2019 1256   BILITOT 1.5 (H) 08/08/2019 1256   GFRNONAA >60 01/15/2017 0941   GFRAA >60 01/15/2017 0941       Component Value Date/Time   WBC 11.9 (H) 05/24/2018 1336   RBC 4.75 05/24/2018 1336   HGB 15.0 05/24/2018 1336   HGB 15.3 12/16/2010 1045   HCT 42.2 05/24/2018 1336   HCT 43.8 12/16/2010 1045   PLT 305.0 05/24/2018 1336   PLT 224 12/16/2010 1045   MCV 88.9 05/24/2018 1336   MCV 88.9 12/16/2010 1045   MCH 32.6 01/15/2017 0941   MCHC 35.6 05/24/2018 1336   RDW 12.8 05/24/2018 1336   RDW 12.5 12/16/2010 1045   LYMPHSABS 1.8 05/24/2018 1336   LYMPHSABS 2.1 12/16/2010 1045   MONOABS 0.6 05/24/2018 1336   MONOABS 0.8 12/16/2010 1045   EOSABS 0.0 05/24/2018 1336   EOSABS 0.3 12/16/2010 1045   BASOSABS 0.1 05/24/2018 1336   BASOSABS 0.1 12/16/2010 1045    No results found for: POCLITH, LITHIUM   No results found  for: PHENYTOIN, PHENOBARB, VALPROATE, CBMZ   .res Assessment: Plan:   Discussed several treatment options with patient and her granddaughter to include considering increase in Lexapro to improve anxiety and rumination.  Patient reports that she would prefer not to increase any of her medications and would prefer to minimize medications.  Discussed recommendation to continue current medication to prevent recurrence of depression and psychosis.  Patient agrees to continuing current plan of care. Discussed considering psychotherapy for treatment of anxiety since patient is reluctant to increase medication.  Patient describes some reservations about starting therapy.  Discussed that provider would include possible therapy referral in after visit summary so that she would have therapist's contact information if she consider seeing a therapist in the future. Patient to follow-up in 3 months or sooner if clinically indicated. Patient advised to contact office with any questions, adverse effects, or acute worsening in signs and symptoms.  Demisha was seen today for follow-up.  Diagnoses and all orders for this visit:  Major depressive disorder in partial remission, unspecified whether recurrent (HCC) -     escitalopram (LEXAPRO) 10 MG tablet; Take 1 tablet (10 mg total) by mouth daily.  Anxiety disorder, unspecified type -     escitalopram (LEXAPRO) 10 MG tablet; Take 1 tablet (10 mg total) by mouth daily.  Brief psychotic disorder ([place]) -     OLANZapine (ZYPREXA) 2.5 MG tablet; Take 1 tablet (2.5 mg total) by mouth at bedtime.     Please see After Visit Summary for patient specific instructions.  Future Appointments  Date Time Provider [place]  11/07/2019 11:00 AM Thayer Headings,  PMHNP CP-CP None    No orders of the defined types were placed in this encounter.   -------------------------------

## 2019-08-09 ENCOUNTER — Other Ambulatory Visit: Payer: Self-pay | Admitting: Family

## 2019-08-09 DIAGNOSIS — R7989 Other specified abnormal findings of blood chemistry: Secondary | ICD-10-CM

## 2019-08-17 ENCOUNTER — Telehealth: Payer: Self-pay

## 2019-08-17 NOTE — Telephone Encounter (Signed)
Copied from [place] [id number]. Topic: Referral - Status >> Aug 17, 2019  1:40 PM Erick Blinks wrote: Reason for CRM: Pt has an appt pending for neuropsychology, requesting new referral because the provider selected is less than desirable.  New referral request includes: Darrall Dears, [place] mental instability for pt

## 2019-08-18 ENCOUNTER — Other Ambulatory Visit: Payer: Self-pay | Admitting: Family

## 2019-08-18 DIAGNOSIS — F3342 Major depressive disorder, recurrent, in full remission: Secondary | ICD-10-CM

## 2019-08-18 DIAGNOSIS — R413 Other amnesia: Secondary | ICD-10-CM

## 2019-09-19 ENCOUNTER — Ambulatory Visit (INDEPENDENT_AMBULATORY_CARE_PROVIDER_SITE_OTHER)
Admission: RE | Admit: 2019-09-19 | Discharge: 2019-09-19 | Disposition: A | Discharge status: Home / Self Care | Payer: Medicare Other | Source: Ambulatory Visit | Attending: Internal Medicine | Admitting: Internal Medicine

## 2019-09-19 ENCOUNTER — Encounter: Payer: Self-pay | Admitting: Internal Medicine

## 2019-09-19 ENCOUNTER — Ambulatory Visit (INDEPENDENT_AMBULATORY_CARE_PROVIDER_SITE_OTHER): Payer: Medicare Other | Admitting: Internal Medicine

## 2019-09-19 ENCOUNTER — Other Ambulatory Visit: Payer: Self-pay

## 2019-09-19 ENCOUNTER — Ambulatory Visit: Payer: Medicare Other | Admitting: Family

## 2019-09-19 ENCOUNTER — Other Ambulatory Visit (INDEPENDENT_AMBULATORY_CARE_PROVIDER_SITE_OTHER): Payer: Medicare Other

## 2019-09-19 VITALS — BP 130/80 | HR 76 | Temp 98.1°F | Ht 62.0 in | Wt 126.0 lb

## 2019-09-19 DIAGNOSIS — M533 Sacrococcygeal disorders, not elsewhere classified: Secondary | ICD-10-CM | POA: Diagnosis not present

## 2019-09-19 DIAGNOSIS — E538 Deficiency of other specified B group vitamins: Secondary | ICD-10-CM | POA: Diagnosis not present

## 2019-09-19 DIAGNOSIS — E559 Vitamin D deficiency, unspecified: Secondary | ICD-10-CM

## 2019-09-19 DIAGNOSIS — H538 Other visual disturbances: Secondary | ICD-10-CM

## 2019-09-19 DIAGNOSIS — I10 Essential (primary) hypertension: Secondary | ICD-10-CM

## 2019-09-19 DIAGNOSIS — R748 Abnormal levels of other serum enzymes: Secondary | ICD-10-CM

## 2019-09-19 DIAGNOSIS — Z0001 Encounter for general adult medical examination with abnormal findings: Secondary | ICD-10-CM

## 2019-09-19 DIAGNOSIS — Z0000 Encounter for general adult medical examination without abnormal findings: Secondary | ICD-10-CM | POA: Diagnosis not present

## 2019-09-19 DIAGNOSIS — E611 Iron deficiency: Secondary | ICD-10-CM

## 2019-09-19 DIAGNOSIS — E119 Type 2 diabetes mellitus without complications: Secondary | ICD-10-CM

## 2019-09-19 HISTORY — DX: Sacrococcygeal disorders, not elsewhere classified: M53.3

## 2019-09-19 HISTORY — DX: Abnormal levels of other serum enzymes: R74.8

## 2019-09-19 HISTORY — DX: Other visual disturbances: H53.8

## 2019-09-19 LAB — CBC WITH DIFFERENTIAL/PLATELET
Basophils Absolute: 0.1 K/uL (ref 0.0–0.1)
Basophils Relative: 0.7 % (ref 0.0–3.0)
Eosinophils Absolute: 0.1 K/uL (ref 0.0–0.7)
Eosinophils Relative: 1.3 % (ref 0.0–5.0)
HCT: 42.4 % (ref 36.0–46.0)
Hemoglobin: 14.7 g/dL (ref 12.0–15.0)
Lymphocytes Relative: 6.9 % — ABNORMAL LOW (ref 12.0–46.0)
Lymphs Abs: 0.6 K/uL — ABNORMAL LOW (ref 0.7–4.0)
MCHC: 34.7 g/dL (ref 30.0–36.0)
MCV: 92.2 fl (ref 78.0–100.0)
Monocytes Absolute: 0.8 K/uL (ref 0.1–1.0)
Monocytes Relative: 9.8 % (ref 3.0–12.0)
Neutro Abs: 6.5 K/uL (ref 1.4–7.7)
Neutrophils Relative %: 81.3 % — ABNORMAL HIGH (ref 43.0–77.0)
Platelets: 237 K/uL (ref 150.0–400.0)
RBC: 4.6 Mil/uL (ref 3.87–5.11)
RDW: 13.6 % (ref 11.5–15.5)
WBC: 8 K/uL (ref 4.0–10.5)

## 2019-09-19 LAB — BASIC METABOLIC PANEL
BUN: 15 mg/dL (ref 6–23)
CO2: 29 mEq/L (ref 19–32)
Calcium: 10.7 mg/dL — ABNORMAL HIGH (ref 8.4–10.5)
Chloride: 97 mEq/L (ref 96–112)
Creatinine, Ser: 0.64 mg/dL (ref 0.40–1.20)
GFR: 90.78 mL/min (ref >60.00)
Glucose, Bld: 122 mg/dL — ABNORMAL HIGH (ref 70–99)
Potassium: 4 mEq/L (ref 3.5–5.1)
Sodium: 131 mEq/L — ABNORMAL LOW (ref 135–145)

## 2019-09-19 LAB — VITAMIN D 25 HYDROXY (VIT D DEFICIENCY, FRACTURES): VITD: 36.06 ng/mL (ref 30.00–100.00)

## 2019-09-19 LAB — VITAMIN B12: Vitamin B-12: 674 pg/mL (ref 211–911)

## 2019-09-19 LAB — HEPATIC FUNCTION PANEL
ALT: 44 U/L — ABNORMAL HIGH (ref 0–35)
AST: 25 U/L (ref 0–37)
Albumin: 4.3 g/dL (ref 3.5–5.2)
Alkaline Phosphatase: 89 U/L (ref 39–117)
Bilirubin, Direct: 0.3 mg/dL (ref 0.0–0.3)
Total Bilirubin: 1.4 mg/dL — ABNORMAL HIGH (ref 0.2–1.2)
Total Protein: 7.1 g/dL (ref 6.0–8.3)

## 2019-09-19 LAB — IBC PANEL
Iron: 52 ug/dL (ref 42–145)
Saturation Ratios: 17.2 % — ABNORMAL LOW (ref 20.0–50.0)
Transferrin: 216 mg/dL (ref 212.0–360.0)

## 2019-09-19 LAB — TSH: TSH: 0.9 u[IU]/mL (ref 0.35–4.50)

## 2019-09-19 NOTE — Progress Notes (Signed)
Subjective:    Patient ID: Brandi Walsh, female    DOB: 01/17/1946, 73 y.o.   MRN: [id number]  HPI  Here for wellness and f/u though sees Ria Clock NP as PCP;  Overall doing ok;  Pt denies Chest pain, worsening SOB, DOE, wheezing, orthopnea, PND, worsening LE edema, palpitations, dizziness or syncope.  Pt denies neurological change such as new headache, facial or extremity weakness.  Pt denies polydipsia, polyuria, or low sugar symptoms. Pt states overall good compliance with treatment and medications, good tolerability, and has been trying to follow appropriate diet.  Pt denies worsening depressive symptoms, suicidal ideation or panic. No fever, night sweats, wt loss, loss of appetite, or other constitutional symptoms.  Pt states good ability with ADL's, has low fall risk, home safety reviewed and adequate, no other significant changes in hearing or vision, and only occasionally active with exercise.   Also c/o some discomfort and "Knot" to backside, with very mild discomfort at time but noreally a "pain", noticed about 1 wk ago, no skin issue, swelling, redness, drainage but just feels hard and hoping it is not serious.  Nothing seems to make better or worse.  Also menitons intermitent blurry vision, has not seen optho recently.  Denies worsening reflux, abd pain, dysphagia, n/v, bowel change or blood, though has had mild inceased LFTs and denies ETOH use.  Conts to see endo for hyperglycemia Past Medical History:  Diagnosis Date  . COUGH DUE TO ACE INHIBITORS 11/25/2009   Qualifier: Diagnosis of  By: Everardo All MD, Cleophas Dunker   . Depression   . DIABETES MELLITUS, TYPE II 10/13/2007   Qualifier: Diagnosis of  By: Charlsie Quest RMA, Lucy    . HYPERLIPIDEMIA 10/13/2007   Qualifier: Diagnosis of  By: Tyrone Apple, Lucy    . HYPERTENSION 10/13/2007   Qualifier: Diagnosis of  By: Tyrone Apple, Lucy    . MENOPAUSAL SYNDROME 10/13/2007   Qualifier: Diagnosis of  By: Charlsie Quest RMA, Lucy    . OSTEOPOROSIS 10/13/2007   Qualifier: Diagnosis of  By: Samara Snide     Past Surgical History:  Procedure Laterality Date  . ABDOMINAL HYSTERECTOMY    . BREAST BIOPSY  08/2002  . TONSILLECTOMY      reports that she has never smoked. She has never used smokeless tobacco. She reports that she does not drink alcohol or use drugs. family history includes Anxiety disorder in her mother; Hyperparathyroidism in her sister. Allergies  Allergen Reactions  . Lovastatin     REACTION: Rash   Current Outpatient Medications on File Prior to Visit  Medication Sig Dispense Refill  . Cholecalciferol (VITAMIN D) 50 MCG (2000 UT) tablet Take 2,000 Units by mouth daily.    Marland Kitchen escitalopram (LEXAPRO) 10 MG tablet Take 1 tablet (10 mg total) by mouth daily. 90 tablet 0  . glucose blood (FREESTYLE LITE) test strip 1 each by Other route daily. Use as instructed 100 each 3  . losartan (COZAAR) 100 MG tablet Take 1 tablet (100 mg total) by mouth daily. 90 tablet 1  . Multiple Vitamin (THERA) TABS Take by mouth.    . OLANZapine (ZYPREXA) 2.5 MG tablet Take 1 tablet (2.5 mg total) by mouth at bedtime. 90 tablet 0  . rosuvastatin (CRESTOR) 40 MG tablet Take 1 tablet (40 mg total) by mouth daily. 90 tablet 1   No current facility-administered medications on file prior to visit.    Review of Systems  Constitutional: Negative for other unusual diaphoresis or sweats  HENT: Negative for ear discharge or swelling Eyes: Negative for other worsening visual disturbances Respiratory: Negative for stridor or other swelling  Gastrointestinal: Negative for worsening distension or other blood Genitourinary: Negative for retention or other urinary change Musculoskeletal: Negative for other MSK pain or swelling Skin: Negative for color change or other new lesions Neurological: Negative for worsening tremors and other numbness  Psychiatric/Behavioral: Negative for worsening agitation or other fatigue All otherwise neg per pt     Objective:    Physical Exam BP 130/80 (BP Location: Left Arm, Patient Position: Sitting, Cuff Size: Normal)   Pulse 76   Temp 98.1 F (36.7 C) (Oral)   Ht 5\' 2"  (1.575 m)   Wt 126 lb (57.2 kg)   SpO2 98%   BMI 23.05 kg/m  VS noted,  Constitutional: Pt appears in NAD HENT: Head: NCAT.  Right Ear: External ear normal.  Left Ear: External ear normal.  Eyes: . Pupils are equal, round, and reactive to light. Conjunctivae and EOM are normal Nose: without d/c or deformity Neck: Neck supple. Gross normal ROM Cardiovascular: Normal rate and regular rhythm.   Pulmonary/Chest: Effort normal and breath sounds without rales or wheezing.  Abd:  Soft, NT, ND, + BS, no organomegaly Coccyx area with bony coccyx easily palpated and corresponds to the area in question, without redness, rash or swelling Neurological: Pt is alert. At baseline orientation, motor grossly intact Skin: Skin is warm. No rashes, other new lesions, no LE edema Psychiatric: Pt behavior is normal without agitation  All otherwise neg per pt  Lab Results  Component Value Date   WBC 11.9 (H) 05/24/2018   HGB 15.0 05/24/2018   HCT 42.2 05/24/2018   PLT 305.0 05/24/2018   GLUCOSE 125 (H) 08/08/2019   CHOL 104 08/08/2019   TRIG 55.0 08/08/2019   HDL 42.00 08/08/2019   LDLDIRECT 61.8 06/12/2013   LDLCALC 51 08/08/2019   ALT 100 (H) 08/08/2019   AST 47 (H) 08/08/2019   NA 135 08/08/2019   K 3.8 08/08/2019   CL 100 08/08/2019   CREATININE 0.64 08/08/2019   BUN 13 08/08/2019   CO2 27 08/08/2019   TSH 0.67 05/24/2018   HGBA1C 5.9 08/08/2019   MICROALBUR 0.7 05/24/2018       Assessment & Plan:

## 2019-09-19 NOTE — Patient Instructions (Signed)
Please continue all other medications as before, and refills have been done if requested.  Please have the pharmacy call with any other refills you may need.  Please continue your efforts at being more active, low cholesterol diet, and weight control.  You are otherwise up to date with prevention measures today.  Please keep your appointments with your specialists as you may have planned  You will be contacted regarding the referral for: eye doctor  Please go to the XRAY Department in the Basement (go straight as you get off the elevator) for the x-ray testing  Please go to the LAB in the Basement (turn left off the elevator) for the tests to be done today  You will be contacted by phone if any changes need to be made immediately.  Otherwise, you will receive a letter about your results with an explanation, but please check with MyChart first.  Please return in 6 months, or sooner if needed

## 2019-09-20 ENCOUNTER — Other Ambulatory Visit: Payer: Self-pay | Admitting: Internal Medicine

## 2019-09-20 DIAGNOSIS — I719 Aortic aneurysm of unspecified site, without rupture: Secondary | ICD-10-CM

## 2019-09-20 LAB — HEPATITIS PANEL, ACUTE
Hep A IgM: NONREACTIVE
Hep B C IgM: NONREACTIVE
Hepatitis B Surface Ag: NONREACTIVE
Hepatitis C Ab: NONREACTIVE
SIGNAL TO CUT-OFF: 0.03 (ref <1.00)

## 2019-09-20 LAB — URINALYSIS, ROUTINE W REFLEX MICROSCOPIC
Bilirubin Urine: NEGATIVE
Ketones, ur: NEGATIVE
Nitrite: NEGATIVE
Specific Gravity, Urine: 1.015 (ref 1.000–1.030)
Total Protein, Urine: NEGATIVE
Urine Glucose: NEGATIVE
Urobilinogen, UA: 0.2 (ref 0.0–1.0)
pH: 7 (ref 5.0–8.0)

## 2019-09-24 ENCOUNTER — Encounter: Payer: Self-pay | Admitting: Internal Medicine

## 2019-09-24 NOTE — Assessment & Plan Note (Addendum)
Mild, suspect prominent coccyx related to [facility] and last of subq fat, will check films but supsect this is normal physiologically  In addition to the time spent performing CPE, I spent an additional 25 minutes face to face,in which greater than 50% of this time was spent in counseling and coordination of care for patient's acute illness as documented, including the differential dx, treatment, further evaluation and other management of coccydysnia, DM, HTN, blurred vision, abnormal LFTs

## 2019-09-24 NOTE — Assessment & Plan Note (Signed)

## 2019-09-24 NOTE — Assessment & Plan Note (Signed)
Etiology unclear, for optho referral 

## 2019-09-24 NOTE — Assessment & Plan Note (Signed)
stable overall by history and exam, recent data reviewed with pt, and pt to continue medical treatment as before,  to f/u any worsening symptoms or concerns  

## 2019-09-24 NOTE — Assessment & Plan Note (Signed)
Mild, etiology unclear, for acute hep panel with labs

## 2019-09-25 ENCOUNTER — Ambulatory Visit
Admission: RE | Admit: 2019-09-25 | Discharge: 2019-09-25 | Disposition: A | Discharge status: Home / Self Care | Payer: Medicare Other | Source: Ambulatory Visit | Attending: Internal Medicine | Admitting: Internal Medicine

## 2019-09-25 DIAGNOSIS — I719 Aortic aneurysm of unspecified site, without rupture: Secondary | ICD-10-CM

## 2019-10-21 ENCOUNTER — Other Ambulatory Visit: Payer: Self-pay | Admitting: Family

## 2019-11-04 ENCOUNTER — Other Ambulatory Visit: Payer: Self-pay | Admitting: Psychiatry

## 2019-11-04 DIAGNOSIS — F23 Brief psychotic disorder: Secondary | ICD-10-CM

## 2019-11-07 ENCOUNTER — Encounter: Payer: Self-pay | Admitting: Psychiatry

## 2019-11-07 ENCOUNTER — Other Ambulatory Visit: Payer: Self-pay

## 2019-11-07 ENCOUNTER — Ambulatory Visit (INDEPENDENT_AMBULATORY_CARE_PROVIDER_SITE_OTHER): Payer: Medicare Other | Admitting: Psychiatry

## 2019-11-07 DIAGNOSIS — F23 Brief psychotic disorder: Secondary | ICD-10-CM | POA: Diagnosis not present

## 2019-11-07 DIAGNOSIS — F324 Major depressive disorder, single episode, in partial remission: Secondary | ICD-10-CM | POA: Diagnosis not present

## 2019-11-07 DIAGNOSIS — F419 Anxiety disorder, unspecified: Secondary | ICD-10-CM | POA: Diagnosis not present

## 2019-11-07 MED ORDER — ESCITALOPRAM OXALATE 10 MG PO TABS: 10.0000 mg | ORAL_TABLET | Freq: Every day | ORAL | 1 refills | Status: DC

## 2019-11-07 MED ORDER — OLANZAPINE 2.5 MG PO TABS: 2.5000 mg | ORAL_TABLET | Freq: Every day | ORAL | 1 refills | Status: DC

## 2019-11-07 NOTE — Progress Notes (Signed)
Brandi GitelmanBrenda P Walsh [id number] Apr 01, 1946 73 y.o.  Subjective:   Patient ID:  Brandi GitelmanBrenda P Walsh is a 73 y.o. (DOB Apr 01, 1946) female.  Chief Complaint:  Chief Complaint  Patient presents with  . Follow-up    History of depression, anxiety, and psychosis    HPI Brandi GitelmanBrenda P Walsh presents to the office today for follow-up of  Depression, anxiety, insomnia, and h/o psychosis. She is accompanied by her daughter.  Granddaughter participates via telephone at conclusion of exam.  Pt reports that she has had some mild anxiety with upcoming holidays. She describes her mood as "joyful." She reports that she has occ sad days for 1-2 days "out of the blue" and may occur weekly. She reports that she does not eat as much as she used to and that escitalopram helps with appetite. Lower energy and motivation. Has not been cooking very often. She reports adequate sleep. Adequate concentration. Denies anhedonia. Denies SI. Denies delusions or paranoia.   Daughter reports that there have been a few days that she has come to visit and pt is still in her pajamas at mid-day. Daughter reports that pt will get dressed early if there are plans. Daughter reports that pt has not exhibited any significant change in behavior.   Pt's husband has noticed occ "mouth tremble." Pt notices occ tremor when she looks  Review of Systems:  Review of Systems  Gastrointestinal: Negative.   Musculoskeletal: Negative for gait problem.  Neurological: Negative for tremors.  Psychiatric/Behavioral:       Please refer to HPI   Diagnosed with DM 20 years ago. Reports that she thinks she may have worsening glucose levels.  Medications: I have reviewed the patient's current medications.  Current Outpatient Medications  Medication Sig Dispense Refill  . Cholecalciferol (VITAMIN D) 50 MCG (2000 UT) tablet Take 2,000 Units by mouth daily.    Marland Kitchen. losartan (COZAAR) 100 MG tablet Take 1 tablet by mouth once daily 90 tablet 0  . Multiple Vitamin  (THERA) TABS Take by mouth.    . OLANZapine (ZYPREXA) 2.5 MG tablet Take 1 tablet (2.5 mg total) by mouth at bedtime. 90 tablet 1  . rosuvastatin (CRESTOR) 40 MG tablet Take 1 tablet (40 mg total) by mouth daily. 90 tablet 1  . escitalopram (LEXAPRO) 10 MG tablet Take 1 tablet (10 mg total) by mouth daily. 90 tablet 1  . glucose blood (FREESTYLE LITE) test strip 1 each by Other route daily. Use as instructed 100 each 3   No current facility-administered medications for this visit.    Medication Side Effects: Other: Occ mouth "tremble"  Allergies:  Allergies  Allergen Reactions  . Lovastatin     REACTION: Rash    Past Medical History:  Diagnosis Date  . COUGH DUE TO ACE INHIBITORS 11/25/2009   Qualifier: Diagnosis of  By: Everardo AllEllison MD, Cleophas DunkerSean A   . Depression   . DIABETES MELLITUS, TYPE II 10/13/2007   Qualifier: Diagnosis of  By: Charlsie QuestBrand RMA, Lucy    . HYPERLIPIDEMIA 10/13/2007   Qualifier: Diagnosis of  By: Tyrone AppleBrand RMA, Lucy    . HYPERTENSION 10/13/2007   Qualifier: Diagnosis of  By: Tyrone AppleBrand RMA, Lucy    . MENOPAUSAL SYNDROME 10/13/2007   Qualifier: Diagnosis of  By: Charlsie QuestBrand RMA, Lucy    . OSTEOPOROSIS 10/13/2007   Qualifier: Diagnosis of  By: Samara SnideBrand RMA, Lucy      Family History  Problem Relation Age of Onset  . Anxiety disorder Mother   . Hyperparathyroidism Sister   .  Cancer Neg Hx   . Breast cancer Neg Hx     Social History   Socioeconomic History  . Marital status: Married    Spouse name: Not on file  . Number of children: Not on file  . Years of education: Not on file  . Highest education level: Not on file  Occupational History  . Occupation: Retired   Tobacco Use  . Smoking status: Never Smoker  . Smokeless tobacco: Never Used  Substance and Sexual Activity  . Alcohol use: No  . Drug use: No  . Sexual activity: Yes  Other Topics Concern  . Not on file  Social History Narrative  . Not on file   Social Determinants of Health   Financial Resource Strain:    . Difficulty of Paying Living Expenses: Not on file  Food Insecurity:   . Worried About Programme researcher, broadcasting/film/video in the Last Year: Not on file  . Ran Out of Food in the Last Year: Not on file  Transportation Needs:   . Lack of Transportation (Medical): Not on file  . Lack of Transportation (Non-Medical): Not on file  Physical Activity:   . Days of Exercise per Week: Not on file  . Minutes of Exercise per Session: Not on file  Stress:   . Feeling of Stress : Not on file  Social Connections:   . Frequency of Communication with Friends and Family: Not on file  . Frequency of Social Gatherings with Friends and Family: Not on file  . Attends Religious Services: Not on file  . Active Member of Clubs or Organizations: Not on file  . Attends Banker Meetings: Not on file  . Marital Status: Not on file  Intimate Partner Violence:   . Fear of Current or Ex-Partner: Not on file  . Emotionally Abused: Not on file  . Physically Abused: Not on file  . Sexually Abused: Not on file    Past Medical History, Surgical history, Social history, and Family history were reviewed and updated as appropriate.   Please see review of systems for further details on the patient's review from today.   Objective:   Physical Exam:  Wt 125 lb (56.7 kg)   BMI 22.86 kg/m   Physical Exam Constitutional:      General: She is not in acute distress.    Appearance: She is well-developed.  Musculoskeletal:        General: No deformity.  Neurological:     Mental Status: She is alert and oriented to person, place, and time.     Coordination: Coordination normal.  Psychiatric:        Attention and Perception: Attention and perception normal. She does not perceive auditory or visual hallucinations.        Mood and Affect: Affect is not labile, blunt, angry or inappropriate.        Speech: Speech normal.        Behavior: Behavior normal.        Thought Content: Thought content normal. Thought content  is not paranoid or delusional. Thought content does not include homicidal or suicidal ideation. Thought content does not include homicidal or suicidal plan.        Cognition and Memory: Cognition and memory normal.        Judgment: Judgment normal.     Comments: Mood presents as mildly depressed Insight intact. No delusions.      Lab Review:     Component Value Date/Time  NA 131 (L) 09/19/2019 1631   K 4.0 09/19/2019 1631   CL 97 09/19/2019 1631   CO2 29 09/19/2019 1631   GLUCOSE 122 (H) 09/19/2019 1631   BUN 15 09/19/2019 1631   CREATININE 0.64 09/19/2019 1631   CREATININE 0.82 02/13/2019 1031   CALCIUM 10.7 (H) 09/19/2019 1631   CALCIUM 10.8 (H) 02/19/2012 1129   PROT 7.1 09/19/2019 1631   ALBUMIN 4.3 09/19/2019 1631   AST 25 09/19/2019 1631   ALT 44 (H) 09/19/2019 1631   ALKPHOS 89 09/19/2019 1631   BILITOT 1.4 (H) 09/19/2019 1631   GFRNONAA >60 01/15/2017 0941   GFRAA >60 01/15/2017 0941       Component Value Date/Time   WBC 8.0 09/19/2019 1631   RBC 4.60 09/19/2019 1631   HGB 14.7 09/19/2019 1631   HGB 15.3 12/16/2010 1045   HCT 42.4 09/19/2019 1631   HCT 43.8 12/16/2010 1045   PLT 237.0 09/19/2019 1631   PLT 224 12/16/2010 1045   MCV 92.2 09/19/2019 1631   MCV 88.9 12/16/2010 1045   MCH 32.6 01/15/2017 0941   MCHC 34.7 09/19/2019 1631   RDW 13.6 09/19/2019 1631   RDW 12.5 12/16/2010 1045   LYMPHSABS 0.6 (L) 09/19/2019 1631   LYMPHSABS 2.1 12/16/2010 1045   MONOABS 0.8 09/19/2019 1631   MONOABS 0.8 12/16/2010 1045   EOSABS 0.1 09/19/2019 1631   EOSABS 0.3 12/16/2010 1045   BASOSABS 0.1 09/19/2019 1631   BASOSABS 0.1 12/16/2010 1045    No results found for: POCLITH, LITHIUM   No results found for: PHENYTOIN, PHENOBARB, VALPROATE, CBMZ   .res Assessment: Plan:   Patient seen for 30 minutes and greater than 50% of visit spent counseling patient and family regarding treatment options and their questions about tardive dyskinesia since family has  noticed occasional mild orofacial involuntary movements.  Discussed options of discontinuing olanzapine to try to improve tardive dyskinesia, however patient exhibited some worsening signs and symptoms with discontinuation of olanzapine in the past.  Discussed that treatment options are available for tardive dyskinesia, however patient and her family report that tardive dyskinesia is currently mild and not causing any distress or impaired function.  Family and patient report that overall benefits of olanzapine outweigh side effects, and they would prefer that olanzapine be continued at current dose.  Advised patient and her family to notify provider if tardive dyskinesia worsens. Continue olanzapine 2.5 mg at bedtime. Continue Lexapro 10 mg daily for depression and anxiety. Patient to follow-up in 3 months or sooner if clinically indicated. Patient advised to contact office with any questions, adverse effects, or acute worsening in signs and symptoms.  Brandi Walsh was seen today for follow-up.  Diagnoses and all orders for this visit:  Major depressive disorder in partial remission, unspecified whether recurrent (HCC) -     escitalopram (LEXAPRO) 10 MG tablet; Take 1 tablet (10 mg total) by mouth daily.  Anxiety disorder, unspecified type -     escitalopram (LEXAPRO) 10 MG tablet; Take 1 tablet (10 mg total) by mouth daily.  Brief psychotic disorder ([place]) -     OLANZapine (ZYPREXA) 2.5 MG tablet; Take 1 tablet (2.5 mg total) by mouth at bedtime.     Please see After Visit Summary for patient specific instructions.  Future Appointments  Date Time Provider [place]  11/16/2019  1:00 PM [facility]  02/05/2020  1:30 PM Thayer Headings, PMHNP [facility] None    No orders of the defined types were placed in  this encounter.   -------------------------------

## 2019-11-07 NOTE — Progress Notes (Signed)
   11/07/19 1143  Facial and Oral Movements  Muscles of Facial Expression 0  Lips and Perioral Area 1  Jaw 0  Tongue 0  Extremity Movements  Upper (arms, wrists, hands, fingers) 0  Overall Severity  Severity of abnormal movements (highest score from questions above) 0  Incapacitation due to abnormal movements 0  Patient's awareness of abnormal movements (rate only patient's report) 1  Dental Status  Current problems with teeth and/or dentures? Yes  Does patient usually wear dentures? Yes

## 2019-11-16 ENCOUNTER — Ambulatory Visit: Payer: Medicare Other

## 2019-11-17 ENCOUNTER — Other Ambulatory Visit: Payer: Self-pay | Admitting: Psychiatry

## 2019-11-17 DIAGNOSIS — F419 Anxiety disorder, unspecified: Secondary | ICD-10-CM

## 2019-11-17 DIAGNOSIS — F324 Major depressive disorder, single episode, in partial remission: Secondary | ICD-10-CM

## 2019-11-22 ENCOUNTER — Other Ambulatory Visit: Payer: Self-pay

## 2019-11-22 ENCOUNTER — Ambulatory Visit (INDEPENDENT_AMBULATORY_CARE_PROVIDER_SITE_OTHER): Payer: Medicare Other | Admitting: Family

## 2019-11-22 ENCOUNTER — Encounter: Payer: Self-pay | Admitting: Family

## 2019-11-22 ENCOUNTER — Ambulatory Visit (INDEPENDENT_AMBULATORY_CARE_PROVIDER_SITE_OTHER): Payer: Medicare Other

## 2019-11-22 VITALS — BP 116/78 | HR 95 | Temp 98.2°F | Ht 62.0 in | Wt 122.2 lb

## 2019-11-22 DIAGNOSIS — G8929 Other chronic pain: Secondary | ICD-10-CM

## 2019-11-22 DIAGNOSIS — M5441 Lumbago with sciatica, right side: Secondary | ICD-10-CM

## 2019-11-22 DIAGNOSIS — M5442 Lumbago with sciatica, left side: Secondary | ICD-10-CM

## 2019-11-22 DIAGNOSIS — E210 Primary hyperparathyroidism: Secondary | ICD-10-CM | POA: Diagnosis not present

## 2019-11-22 DIAGNOSIS — R200 Anesthesia of skin: Secondary | ICD-10-CM | POA: Diagnosis not present

## 2019-11-22 DIAGNOSIS — R197 Diarrhea, unspecified: Secondary | ICD-10-CM | POA: Diagnosis not present

## 2019-11-22 DIAGNOSIS — R202 Paresthesia of skin: Secondary | ICD-10-CM

## 2019-11-22 LAB — COMPREHENSIVE METABOLIC PANEL
ALT: 41 U/L — ABNORMAL HIGH (ref 0–35)
AST: 25 U/L (ref 0–37)
Albumin: 4.2 g/dL (ref 3.5–5.2)
Alkaline Phosphatase: 99 U/L (ref 39–117)
BUN: 14 mg/dL (ref 6–23)
CO2: 25 mEq/L (ref 19–32)
Calcium: 10.8 mg/dL — ABNORMAL HIGH (ref 8.4–10.5)
Chloride: 103 mEq/L (ref 96–112)
Creatinine, Ser: 0.74 mg/dL (ref 0.40–1.20)
GFR: 76.74 mL/min (ref >60.00)
Glucose, Bld: 136 mg/dL — ABNORMAL HIGH (ref 70–99)
Potassium: 4.3 mEq/L (ref 3.5–5.1)
Sodium: 136 mEq/L (ref 135–145)
Total Bilirubin: 1.1 mg/dL (ref 0.2–1.2)
Total Protein: 6.9 g/dL (ref 6.0–8.3)

## 2019-11-22 LAB — LIPASE: Lipase: 35 U/L (ref 11.0–59.0)

## 2019-11-22 LAB — CBC WITH DIFFERENTIAL/PLATELET
Basophils Absolute: 0.1 10*3/uL (ref 0.0–0.1)
Basophils Relative: 0.8 % (ref 0.0–3.0)
Eosinophils Absolute: 0 10*3/uL (ref 0.0–0.7)
Eosinophils Relative: 0.7 % (ref 0.0–5.0)
HCT: 43.8 % (ref 36.0–46.0)
Hemoglobin: 15.2 g/dL — ABNORMAL HIGH (ref 12.0–15.0)
Lymphocytes Relative: 25 % (ref 12.0–46.0)
Lymphs Abs: 1.8 10*3/uL (ref 0.7–4.0)
MCHC: 34.7 g/dL (ref 30.0–36.0)
MCV: 92 fl (ref 78.0–100.0)
Monocytes Absolute: 0.5 10*3/uL (ref 0.1–1.0)
Monocytes Relative: 7 % (ref 3.0–12.0)
Neutro Abs: 4.8 10*3/uL (ref 1.4–7.7)
Neutrophils Relative %: 66.5 % (ref 43.0–77.0)
Platelets: 259 10*3/uL (ref 150.0–400.0)
RBC: 4.76 Mil/uL (ref 3.87–5.11)
RDW: 13.2 % (ref 11.5–15.5)
WBC: 7.3 10*3/uL (ref 4.0–10.5)

## 2019-11-22 LAB — AMYLASE: Amylase: 33 U/L (ref 27–131)

## 2019-11-22 NOTE — Progress Notes (Signed)
Brandi Walsh is a 73 y.o. female with the following history as recorded in EpicCare:  Patient Active Problem List   Diagnosis Date Noted  . 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  . Abnormal liver enzymes 09/19/2019  . Blurred vision 09/19/2019  . Hypercalcemia 03/15/2019  . Major depression in full remission ([place]) 11/24/2018  . Memory loss 12/24/2016  . Encounter for well adult exam with abnormal findings 06/13/2013  . Hyperparathyroidism, primary ([place]) 06/12/2013  . GERD (gastroesophageal reflux disease) 02/19/2012  . 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  . COUGH DUE TO ACE INHIBITORS 11/25/2009  . Diabetes ([place]) 10/13/2007  . Dyslipidemia 10/13/2007  . Essential hypertension 10/13/2007  . MENOPAUSAL SYNDROME 10/13/2007  . Osteoporosis 10/13/2007    Current Outpatient Medications  Medication Sig Dispense Refill  . Cholecalciferol (VITAMIN D) 50 MCG (2000 UT) tablet Take 2,000 Units by mouth daily.    Marland Kitchen escitalopram (LEXAPRO) 10 MG tablet Take 1 tablet (10 mg total) by mouth daily. 90 tablet 1  . glucose blood (FREESTYLE LITE) test strip 1 each by Other route daily. Use as instructed 100 each 3  . losartan (COZAAR) 100 MG tablet Take 1 tablet by mouth once daily 90 tablet 0  . Multiple Vitamin (THERA) TABS Take by mouth.    . OLANZapine (ZYPREXA) 2.5 MG tablet Take 1 tablet (2.5 mg total) by mouth at bedtime. 90 tablet 1  . rosuvastatin (CRESTOR) 40 MG tablet Take 1 tablet (40 mg total) by mouth daily. 90 tablet 1   No current facility-administered medications for this visit.    Allergies: Lovastatin  Past Medical History:  Diagnosis Date  . COUGH DUE TO ACE INHIBITORS 11/25/2009   Qualifier: Diagnosis of  By: [name] MD, [name]   . Depression   . DIABETES MELLITUS, TYPE II 10/13/2007   Qualifier: Diagnosis of  By: Marca Ancona RMA, Lucy    . HYPERLIPIDEMIA 10/13/2007   Qualifier: Diagnosis of  By: Reatha Armour, Lucy    . HYPERTENSION 10/13/2007   Qualifier: Diagnosis of  By: Reatha Armour, Lucy    .  MENOPAUSAL SYNDROME 10/13/2007   Qualifier: Diagnosis of  By: Marca Ancona RMA, Lucy    . OSTEOPOROSIS 10/13/2007   Qualifier: Diagnosis of  By: Larose Kells      Past Surgical History:  Procedure Laterality Date  . ABDOMINAL HYSTERECTOMY    . BREAST BIOPSY  08/2002  . TONSILLECTOMY      Family History  Problem Relation Age of Onset  . Anxiety disorder Mother   . Hyperparathyroidism Sister   . Cancer Neg Hx   . Breast cancer Neg Hx     Social History   Tobacco Use  . Smoking status: Never Smoker  . Smokeless tobacco: Never Used  Substance Use Topics  . Alcohol use: No    Subjective:  Accompanied by her daughter today for follow-up; difficult historian and does not give consistent answers about location or type of symptoms;  Apparently, she has had 2-3 history of numbness in both hips/ toes; does not feel that pain is radiating down into her feet or toes; no known injury to back or hips; no fall recently; B12 level was checked at the end of October and it was normal; feels like her legs "are weak." Diet controlled diabetes- wonders about neuropathy; does have some retinal changes on recent exam; Does have history of hyperparathyroidism- saw new MD in August/ was recommended to have 24 hour urine but has not been able to get that done yet. At one point,  was on medication for symptoms but Cinacalet was discontinued earlier this year.  Also mentions that she has had episodes of diarrhea/ fecal incontinence  for 3 days; initially she says this is just occurring at night but then states that she has been having recurrent episodes for the past few days; admits her "diet has not been good" and that she has been eating a lot of fruit; no fever or vomiting or abdominal pain; family member not able to supplement history today; per patient, she has not had anything to eat today and has not had symptoms today.     Objective:  Vitals:   11/22/19 1009  BP: 116/78  Pulse: 95  Temp: 98.2 F (36.8  C)  TempSrc: Oral  SpO2: 98%  Weight: 122 lb 3.2 oz (55.4 kg)  Height: 5' 2" (1.575 m)    General: Well developed, well nourished, in no acute distress  Skin : Warm and dry.  Head: Normocephalic and atraumatic  Eyes: Sclera and conjunctiva clear; pupils round and reactive to light; extraocular movements intact  Ears: External normal; canals clear; tympanic membranes normal  Oropharynx: Pink, supple. No suspicious lesions  Neck: Supple without thyromegaly, adenopathy  Lungs: Respirations unlabored; clear to auscultation bilaterally without wheeze, rales, rhonchi  CVS exam: normal rate and regular rhythm.  Abdomen: Soft; nontender; nondistended; normoactive bowel sounds; no masses or hepatosplenomegaly  Musculoskeletal: No deformities; no active joint inflammation  Extremities: No edema, cyanosis, clubbing  Vessels: Symmetric bilaterally  Neurologic: Alert and oriented; speech intact; face symmetrical; moves all extremities well; CNII-XII intact without focal deficit   Assessment:  1. Chronic low back pain with bilateral sciatica, unspecified back pain laterality   2. Numbness and tingling of both lower extremities   3. Diarrhea, unspecified type   4. Hyperparathyroidism, primary ([place])     Plan:  1. &  2. Update lumbar X-ray today; may need MRI of low back; B12 was normal in October; may need to consider trial of Gabapentin at night for patient; follow-up to be determined. 3. ? Infectious source as symptoms have only been present for 3 days; check amylase, lipase; foods to help limit diarrhea were discussed; may need to see GI and/or stool culture. 4. Check calcium/ PTH; encouraged patient to follow-up with her endocrinologist as previously recommended and complete requested testing so appropriate management/ follow-up can be discussed.   This visit occurred during the SARS-CoV-2 public health emergency.  Safety protocols were in place, including screening questions prior to the visit,  additional usage of staff PPE, and extensive cleaning of exam room while observing appropriate contact time as indicated for disinfecting solutions.    No follow-ups on file.  Orders Placed This Encounter  Procedures  . DG Lumbar Spine Complete    Order Specific Question:   Reason for Exam (SYMPTOM  OR DIAGNOSIS REQUIRED)    Answer:   low back pain    Order Specific Question:   Preferred imaging location?    Answer:   [facility]    Order Specific Question:   Radiology Contrast Protocol - do NOT remove file path    Answer:   _[email or web address]  . CBC w/Diff  . Comp Met (CMET)  . Amylase  . Lipase  . PTH, Intact and Calcium    Requested Prescriptions    No prescriptions requested or ordered in this encounter

## 2019-11-23 LAB — PTH, INTACT AND CALCIUM
Calcium: 10.5 mg/dL — ABNORMAL HIGH (ref 8.6–10.4)
PTH: 44 pg/mL (ref 14–64)

## 2019-12-20 ENCOUNTER — Encounter: Payer: Self-pay | Admitting: Family

## 2019-12-26 ENCOUNTER — Encounter: Payer: Self-pay | Admitting: Family

## 2020-01-23 ENCOUNTER — Other Ambulatory Visit: Payer: Self-pay | Admitting: Family

## 2020-02-05 ENCOUNTER — Ambulatory Visit (INDEPENDENT_AMBULATORY_CARE_PROVIDER_SITE_OTHER): Payer: Medicare Other | Admitting: Psychiatry

## 2020-02-05 ENCOUNTER — Encounter: Payer: Self-pay | Admitting: Psychiatry

## 2020-02-05 DIAGNOSIS — F23 Brief psychotic disorder: Secondary | ICD-10-CM

## 2020-02-05 DIAGNOSIS — F324 Major depressive disorder, single episode, in partial remission: Secondary | ICD-10-CM

## 2020-02-05 DIAGNOSIS — F419 Anxiety disorder, unspecified: Secondary | ICD-10-CM | POA: Diagnosis not present

## 2020-02-05 MED ORDER — ESCITALOPRAM OXALATE 10 MG PO TABS: 10.0000 mg | ORAL_TABLET | Freq: Every day | ORAL | 0 refills | Status: DC

## 2020-02-05 MED ORDER — OLANZAPINE 2.5 MG PO TABS: 2.5000 mg | ORAL_TABLET | Freq: Every day | ORAL | 0 refills | Status: DC

## 2020-02-05 NOTE — Progress Notes (Signed)
Brandi Walsh [id number] [birth year] 74 y.o.  Virtual Visit via Telephone Note  I connected with pt on 02/05/20 at  1:30 PM EST by telephone and verified that I am speaking with the correct person using two identifiers.   I discussed the limitations, risks, security and privacy concerns of performing an evaluation and management service by telephone and the availability of in person appointments. I also discussed with the patient that there may be a patient responsible charge related to this service. The patient expressed understanding and agreed to proceed.   I discussed the assessment and treatment plan with the patient. The patient was provided an opportunity to ask questions and all were answered. The patient agreed with the plan and demonstrated an understanding of the instructions.   The patient was advised to call back or seek an in-person evaluation if the symptoms worsen or if the condition fails to improve as anticipated.  I provided 15 minutes of non-face-to-face time during this encounter.  The patient was located at home.  The provider was located at [facility].   Brandi Walsh, PMHNP   Subjective:   Patient ID:  Brandi Walsh is a 74 y.o. (DOB 1946/05/08) female.  Chief Complaint:  Chief Complaint  Patient presents with  . Follow-up    h/o Depression, anxiety, and psychosis    HPI Brandi Walsh presents for follow-up of mood and anxiety. She reports that she has been doing well overall. Describes her mood as "average" and denies significant depression. Denies anxiety or irritability. She reports that she is sleeping through the night except to occ use the bathroom. Typically sleeping at least 7 hours a night. Appetite has been good. Energy has been "on average" and denies increased or decreased energy. She reports that her motivation has been good. She reports that her concentration has been "off" at times. She is reading the newspaper and watching TV.  Denies SI. Denies paranoia or delusions.   She reports that she has not noticed any change in orofacial movements. She reports that her husband has noticed that mouth movements may be worsening. Reports that no other family members have commented on this.   She has had both covid vaccinations.   Past Psychiatric Medication Trials: Lexapro Cymbalta Risperdal Olanzapine Remeron- Vivid dreams, nightmares  Review of Systems:  Review of Systems  Eyes: Positive for visual disturbance.       Reports possible diabetic retinopathy.   Endocrine:       She reports that she may have DM.   Musculoskeletal: Negative for gait problem.  Neurological: Negative for tremors.       Neuropathy.  Psychiatric/Behavioral:       Please refer to HPI    Medications: I have reviewed the patient's current medications.  Current Outpatient Medications  Medication Sig Dispense Refill  . Cholecalciferol (VITAMIN D) 50 MCG (2000 UT) tablet Take 2,000 Units by mouth daily.    Marland Kitchen escitalopram (LEXAPRO) 10 MG tablet Take 1 tablet (10 mg total) by mouth daily. 90 tablet 0  . losartan (COZAAR) 100 MG tablet Take 1 tablet by mouth once daily 90 tablet 0  . Multiple Vitamin (THERA) TABS Take by mouth.    . OLANZapine (ZYPREXA) 2.5 MG tablet Take 1 tablet (2.5 mg total) by mouth at bedtime. 90 tablet 0  . rosuvastatin (CRESTOR) 40 MG tablet Take 1 tablet (40 mg total) by mouth daily. 90 tablet 1  . glucose blood (FREESTYLE LITE) test strip 1 each by  Other route daily. Use as instructed 100 each 3   No current facility-administered medications for this visit.    Medication Side Effects: None  She reports that orofacial movements may be coming from her dentures. She does not notice orofacial movements when not wearing dentures.   Allergies:  Allergies  Allergen Reactions  . Lovastatin     REACTION: Rash    Past Medical History:  Diagnosis Date  . COUGH DUE TO ACE INHIBITORS 11/25/2009   Qualifier: Diagnosis  of  By: [name] MD, [name]   . Depression   . DIABETES MELLITUS, TYPE II 10/13/2007   Qualifier: Diagnosis of  By: Marca Ancona RMA, Lucy    . HYPERLIPIDEMIA 10/13/2007   Qualifier: Diagnosis of  By: Reatha Armour, Lucy    . HYPERTENSION 10/13/2007   Qualifier: Diagnosis of  By: Reatha Armour, Lucy    . MENOPAUSAL SYNDROME 10/13/2007   Qualifier: Diagnosis of  By: Marca Ancona RMA, Lucy    . OSTEOPOROSIS 10/13/2007   Qualifier: Diagnosis of  By: Larose Kells      Family History  Problem Relation Age of Onset  . Anxiety disorder Mother   . Hyperparathyroidism Sister   . Cancer Neg Hx   . Breast cancer Neg Hx     Social History   Socioeconomic History  . Marital status: Married    Spouse name: Not on file  . Number of children: Not on file  . Years of education: Not on file  . Highest education level: Not on file  Occupational History  . Occupation: Retired   Tobacco Use  . Smoking status: Never Smoker  . Smokeless tobacco: Never Used  Substance and Sexual Activity  . Alcohol use: No  . Drug use: No  . Sexual activity: Yes  Other Topics Concern  . Not on file  Social History Narrative  . Not on file   Social Determinants of Health   Financial Resource Strain:   . Difficulty of Paying Living Expenses: Not on file  Food Insecurity:   . Worried About Charity fundraiser in the Last Year: Not on file  . Ran Out of Food in the Last Year: Not on file  Transportation Needs:   . Lack of Transportation (Medical): Not on file  . Lack of Transportation (Non-Medical): Not on file  Physical Activity:   . Days of Exercise per Week: Not on file  . Minutes of Exercise per Session: Not on file  Stress:   . Feeling of Stress : Not on file  Social Connections:   . Frequency of Communication with Friends and Family: Not on file  . Frequency of Social Gatherings with Friends and Family: Not on file  . Attends Religious Services: Not on file  . Active Member of Clubs or Organizations: Not on file   . Attends Archivist Meetings: Not on file  . Marital Status: Not on file  Intimate Partner Violence:   . Fear of Current or Ex-Partner: Not on file  . Emotionally Abused: Not on file  . Physically Abused: Not on file  . Sexually Abused: Not on file    Past Medical History, Surgical history, Social history, and Family history were reviewed and updated as appropriate.   Please see review of systems for further details on the patient's review from today.   Objective:   Physical Exam:  Wt 122 lb (55.3 kg)   BMI 22.31 kg/m   Physical Exam Neurological:  Mental Status: She is alert and oriented to person, place, and time.     Cranial Nerves: No dysarthria.  Psychiatric:        Attention and Perception: Attention and perception normal.        Mood and Affect: Mood normal.        Speech: Speech normal.        Behavior: Behavior is cooperative.        Thought Content: Thought content normal. Thought content is not paranoid or delusional. Thought content does not include homicidal or suicidal ideation. Thought content does not include homicidal or suicidal plan.        Cognition and Memory: Cognition and memory normal.        Judgment: Judgment normal.     Comments: Insight intact     Lab Review:     Component Value Date/Time   NA 136 11/22/2019 1053   K 4.3 11/22/2019 1053   CL 103 11/22/2019 1053   CO2 25 11/22/2019 1053   GLUCOSE 136 (H) 11/22/2019 1053   BUN 14 11/22/2019 1053   CREATININE 0.74 11/22/2019 1053   CREATININE 0.82 02/13/2019 1031   CALCIUM 10.8 (H) 11/22/2019 1053   CALCIUM 10.5 (H) 11/22/2019 1053   CALCIUM 10.8 (H) 02/19/2012 1129   PROT 6.9 11/22/2019 1053   ALBUMIN 4.2 11/22/2019 1053   AST 25 11/22/2019 1053   ALT 41 (H) 11/22/2019 1053   ALKPHOS 99 11/22/2019 1053   BILITOT 1.1 11/22/2019 1053   GFRNONAA >60 01/15/2017 0941   GFRAA >60 01/15/2017 0941       Component Value Date/Time   WBC 7.3 11/22/2019 1053   RBC 4.76  11/22/2019 1053   HGB 15.2 (H) 11/22/2019 1053   HGB 15.3 12/16/2010 1045   HCT 43.8 11/22/2019 1053   HCT 43.8 12/16/2010 1045   PLT 259.0 11/22/2019 1053   PLT 224 12/16/2010 1045   MCV 92.0 11/22/2019 1053   MCV 88.9 12/16/2010 1045   MCH 32.6 01/15/2017 0941   MCHC 34.7 11/22/2019 1053   RDW 13.2 11/22/2019 1053   RDW 12.5 12/16/2010 1045   LYMPHSABS 1.8 11/22/2019 1053   LYMPHSABS 2.1 12/16/2010 1045   MONOABS 0.5 11/22/2019 1053   MONOABS 0.8 12/16/2010 1045   EOSABS 0.0 11/22/2019 1053   EOSABS 0.3 12/16/2010 1045   BASOSABS 0.1 11/22/2019 1053   BASOSABS 0.1 12/16/2010 1045    No results found for: POCLITH, LITHIUM   No results found for: PHENYTOIN, PHENOBARB, VALPROATE, CBMZ   .res Assessment: Plan:   Will continue current plan of care since target signs and symptoms are well controlled without any tolerability issues. Pt advised to contact office if orofacial movements increase.  Pt to f/u in 3 months or sooner if clinically indicated.  Patient advised to contact office with any questions, adverse effects, or acute worsening in signs and symptoms.  Poppy was seen today for follow-up.  Diagnoses and all orders for this visit:  Major depressive disorder in partial remission, unspecified whether recurrent (HCC) -     escitalopram (LEXAPRO) 10 MG tablet; Take 1 tablet (10 mg total) by mouth daily.  Anxiety disorder, unspecified type -     escitalopram (LEXAPRO) 10 MG tablet; Take 1 tablet (10 mg total) by mouth daily.  Brief psychotic disorder (HCC) -     OLANZapine (ZYPREXA) 2.5 MG tablet; Take 1 tablet (2.5 mg total) by mouth at bedtime.    Please see After Visit Summary for patient specific  instructions.  No future appointments.  No orders of the defined types were placed in this encounter.     -------------------------------

## 2020-02-19 ENCOUNTER — Telehealth: Payer: Self-pay | Admitting: Family

## 2020-02-19 NOTE — Telephone Encounter (Signed)
    1.Medication Requested: rosuvastatin (CRESTOR) 40 MG tablet  2. Pharmacy (Name, Street, [place]):[facility] - [place], [place] - [address]  3. On Med List: yes  4. Last Visit with PCP:   5. Next visit date with PCP:02/21/20   Agent: Please be advised that RX refills may take up to 3 business days. We ask that you follow-up with your pharmacy.

## 2020-02-20 ENCOUNTER — Other Ambulatory Visit: Payer: Self-pay | Admitting: Family

## 2020-02-20 MED ORDER — ROSUVASTATIN CALCIUM 40 MG PO TABS: 40.0000 mg | ORAL_TABLET | Freq: Every day | ORAL | 1 refills | Status: DC

## 2020-02-21 ENCOUNTER — Encounter: Payer: Self-pay | Admitting: Family

## 2020-02-21 ENCOUNTER — Ambulatory Visit (INDEPENDENT_AMBULATORY_CARE_PROVIDER_SITE_OTHER): Payer: Medicare Other | Admitting: Family

## 2020-02-21 ENCOUNTER — Other Ambulatory Visit: Payer: Self-pay

## 2020-02-21 VITALS — BP 118/68 | HR 72 | Temp 98.0°F | Ht 62.0 in | Wt 122.0 lb

## 2020-02-21 DIAGNOSIS — R413 Other amnesia: Secondary | ICD-10-CM | POA: Diagnosis not present

## 2020-02-21 DIAGNOSIS — R202 Paresthesia of skin: Secondary | ICD-10-CM

## 2020-02-21 DIAGNOSIS — L0591 Pilonidal cyst without abscess: Secondary | ICD-10-CM

## 2020-02-21 DIAGNOSIS — R200 Anesthesia of skin: Secondary | ICD-10-CM | POA: Diagnosis not present

## 2020-02-21 MED ORDER — ROSUVASTATIN CALCIUM 40 MG PO TABS: 40.0000 mg | ORAL_TABLET | Freq: Every day | ORAL | 1 refills | Status: DC

## 2020-02-21 MED ORDER — DOXYCYCLINE HYCLATE 100 MG PO TABS: 100.0000 mg | ORAL_TABLET | Freq: Two times a day (BID) | ORAL | 0 refills | Status: DC

## 2020-02-21 NOTE — Patient Instructions (Signed)
Pilonidal Cyst  A pilonidal cyst is a fluid-filled sac that forms beneath the skin near the tailbone, at the top of the crease of the buttocks (pilonidal area). If the cyst is not large and not infected, it may not cause any problems. If the cyst becomes irritated or infected, it may get larger and fill with pus. An infected cyst is called an abscess. A pilonidal abscess may cause pain and swelling, and it may need to be drained or removed. What are the causes? The cause of this condition is not always known. In some cases, a hair that grows into your skin (ingrown hair) may be the cause. What increases the risk? You are more likely to get a pilonidal cyst if you:  Are female.  Have lots of hair near the crease of the buttocks.  Are overweight.  Have a dimple near the crease of the buttocks.  Wear tight clothing.  Do not bathe or shower often.  Sit for long periods of time. What are the signs or symptoms? Signs and symptoms of a pilonidal cyst may include pain, swelling, redness, and warmth in the pilonidal area. Depending on how big the cyst is, you may be able to feel a lump near your tailbone. If your cyst becomes infected, symptoms may include:  Pus or fluid drainage.  Fever.  Pain, swelling, and redness getting worse.  The lump getting bigger. How is this diagnosed? This condition may be diagnosed based on:  Your symptoms and medical history.  A physical exam.  A blood test to check for infection.  Testing a pus sample, if applicable. How is this treated? If your cyst does not cause symptoms, you may not need any treatment. If your cyst bothers you or is infected, you may need a procedure to drain or remove the cyst. Depending on the size, location, and severity of your cyst, your health care provider may:  Make an incision in the cyst and drain it (incision and drainage).  Open and drain the cyst, and then stitch the wound so that it stays open while it heals  (marsupialization). You will be given instructions about how to care for your open wound while it heals.  Remove all or part of the cyst, and then close the wound (cyst removal). You may need to take antibiotic medicines before your procedure. Follow these instructions at home: Medicines  Take over-the-counter and prescription medicines only as told by your health care provider.  If you were prescribed an antibiotic medicine, take it as told by your health care provider. Do not stop taking the antibiotic even if you start to feel better. General instructions  Keep the area around your pilonidal cyst clean and dry.  If there is fluid or pus draining from your cyst: ? Cover the area with a clean bandage (dressing) as needed. ? Wash the area gently with soap and water. Pat the area dry with a clean towel. Do not rub the area because that may cause bleeding.  Remove hair from the area around the cyst only if your health care provider tells you to do this.  Do not wear tight pants or sit in one position for long periods at a time.  Keep all follow-up visits as told by your health care provider. This is important. Contact a health care provider if you have:  New redness, swelling, or pain.  A fever.  Severe pain. Summary  A pilonidal cyst is a fluid-filled sac that forms beneath the   skin near the tailbone, at the top of the crease of the buttocks (pilonidal area).  If the cyst becomes irritated or infected, it may get larger and fill with pus. An infected cyst is called an abscess.  The cause of this condition is not always known. In some cases, a hair that grows into your skin (ingrown hair) may be the cause.  If your cyst does not cause symptoms, you may not need any treatment. If your cyst bothers you or is infected, you may need a procedure to drain or remove the cyst. This information is not intended to replace advice given to you by your health care provider. Make sure you  discuss any questions you have with your health care provider. Document Revised: 11/04/2017 Document Reviewed: 11/04/2017 Elsevier Patient Education  2020 Elsevier Inc.  

## 2020-02-21 NOTE — Progress Notes (Signed)
Brandi Walsh is a 74 y.o. female with the following history as recorded in EpicCare:  Patient Active Problem List   Diagnosis Date Noted  . 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  . Abnormal liver enzymes 09/19/2019  . Blurred vision 09/19/2019  . Hypercalcemia 03/15/2019  . Major depression in full remission (HCC) 11/24/2018  . Memory loss 12/24/2016  . Encounter for well adult exam with abnormal findings 06/13/2013  . Hyperparathyroidism, primary (HCC) 06/12/2013  . GERD (gastroesophageal reflux disease) 02/19/2012  . 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  . COUGH DUE TO ACE INHIBITORS 11/25/2009  . Diabetes (HCC) 10/13/2007  . Dyslipidemia 10/13/2007  . Essential hypertension 10/13/2007  . MENOPAUSAL SYNDROME 10/13/2007  . Osteoporosis 10/13/2007    Current Outpatient Medications  Medication Sig Dispense Refill  . Cholecalciferol (VITAMIN D) 50 MCG (2000 UT) tablet Take 2,000 Units by mouth daily.    Marland Kitchen escitalopram (LEXAPRO) 10 MG tablet Take 1 tablet (10 mg total) by mouth daily. 90 tablet 0  . glucose blood (FREESTYLE LITE) test strip 1 each by Other route daily. Use as instructed 100 each 3  . losartan (COZAAR) 100 MG tablet Take 1 tablet by mouth once daily 90 tablet 0  . Multiple Vitamin (THERA) TABS Take by mouth.    . OLANZapine (ZYPREXA) 2.5 MG tablet Take 1 tablet (2.5 mg total) by mouth at bedtime. 90 tablet 0  . rosuvastatin (CRESTOR) 40 MG tablet Take 1 tablet (40 mg total) by mouth daily. 90 tablet 1  . doxycycline (VIBRA-TABS) 100 MG tablet Take 1 tablet (100 mg total) by mouth 2 (two) times daily. 20 tablet 0   No current facility-administered medications for this visit.    Allergies: Lovastatin  Past Medical History:  Diagnosis Date  . COUGH DUE TO ACE INHIBITORS 11/25/2009   Qualifier: Diagnosis of  By: Everardo All MD, Cleophas Dunker   . Depression   . DIABETES MELLITUS, TYPE II 10/13/2007   Qualifier: Diagnosis of  By: Charlsie Quest RMA, Lucy    . HYPERLIPIDEMIA 10/13/2007   Qualifier: Diagnosis of   By: Tyrone Apple, Lucy    . HYPERTENSION 10/13/2007   Qualifier: Diagnosis of  By: Tyrone Apple, Lucy    . MENOPAUSAL SYNDROME 10/13/2007   Qualifier: Diagnosis of  By: Charlsie Quest RMA, Lucy    . OSTEOPOROSIS 10/13/2007   Qualifier: Diagnosis of  By: Samara Snide      Past Surgical History:  Procedure Laterality Date  . ABDOMINAL HYSTERECTOMY    . BREAST BIOPSY  08/2002  . TONSILLECTOMY      Family History  Problem Relation Age of Onset  . Anxiety disorder Mother   . Hyperparathyroidism Sister   . Cancer Neg Hx   . Breast cancer Neg Hx     Social History   Tobacco Use  . Smoking status: Never Smoker  . Smokeless tobacco: Never Used  Substance Use Topics  . Alcohol use: No    Subjective:   Patient is concerned about 2 "lumps" in her rectal area; denies any pain but is concerned about persisting symptoms; was originally evaluated in October 2020 and X-ray/ exam at that time were unremarkable; Is also concerned about persisting numbness in her lower extremities; she does not want to consider MRI at this time; she feels that the lumps in her rectal area are causing the numbness and wants to treat this first;    Objective:  Vitals:   02/21/20 1234  BP: 118/68  Pulse: 72  Temp: 98 F (36.7 C)  TempSrc: Oral  SpO2: 98%  Weight: 122 lb (55.3 kg)  Height: 5\' 2"  (1.575 m)    General: Well developed, well nourished, in no acute distress  Skin : Warm and dry. 2 localized pustular areas noted at base of tailbone Head: Normocephalic and atraumatic  Lungs: Respirations unlabored; clear to auscultation bilaterally without Musculoskeletal: No deformities; no active joint inflammation  Extremities: No edema, cyanosis, clubbing  Vessels: Symmetric bilaterally  Neurologic: Alert and oriented; speech intact; face symmetrical; moves all extremities well; CNII-XII intact without focal deficit   Assessment:  1. Pilonidal cyst   2. Numbness and tingling of both lower extremities   3. Memory  loss     Plan:  Patient wants to try treatment before seeing surgeon; she is given Rx for Doxycycline 100 mg bid x 10 days; apply warm compresses; follow-up worse, no better; She also feels that the numbness in her legs is related to the cysts and does not want MRI or to see surgeon at this time; she will call back if symptoms persist; I did review x-ray findings with her that were done in December;  This visit occurred during the SARS-CoV-2 public health emergency.  Safety protocols were in place, including screening questions prior to the visit, additional usage of staff PPE, and extensive cleaning of exam room while observing appropriate contact time as indicated for disinfecting solutions.     No follow-ups on file.  No orders of the defined types were placed in this encounter.   Requested Prescriptions   Signed Prescriptions Disp Refills  . doxycycline (VIBRA-TABS) 100 MG tablet 20 tablet 0    Sig: Take 1 tablet (100 mg total) by mouth 2 (two) times daily.  . rosuvastatin (CRESTOR) 40 MG tablet 90 tablet 1    Sig: Take 1 tablet (40 mg total) by mouth daily.

## 2020-03-04 ENCOUNTER — Telehealth: Payer: Self-pay

## 2020-03-04 NOTE — Telephone Encounter (Signed)
Patient called in wanting to know what the Dr next steps would be for her now that she has finish her antibiotics     Please call and advise

## 2020-03-06 ENCOUNTER — Other Ambulatory Visit: Payer: Self-pay | Admitting: Family

## 2020-03-06 DIAGNOSIS — L0591 Pilonidal cyst without abscess: Secondary | ICD-10-CM

## 2020-03-06 NOTE — Telephone Encounter (Signed)
As I told her at her OV, she needs to see a Careers adviser. She wasn't sure she wanted to do that. Is she okay with referral at this time?

## 2020-04-16 ENCOUNTER — Telehealth: Payer: Self-pay

## 2020-04-16 NOTE — Telephone Encounter (Signed)
Sonny Masters, NP with [employer] was seeing pt during an in home visit where PAD screening called [place] was completed.  Results: RLE nl, LLE decrease in circulation noted as 0.56 (anthing <0.9 considered abnl).  She notes that patient is asymptomatic however, these findings need to be addressed at next OV.  Pt is not noted to have a future appt at this time.

## 2020-04-22 ENCOUNTER — Encounter: Payer: Self-pay | Admitting: Physician Assistant

## 2020-04-22 ENCOUNTER — Encounter: Payer: Self-pay | Admitting: Family

## 2020-04-22 ENCOUNTER — Ambulatory Visit (INDEPENDENT_AMBULATORY_CARE_PROVIDER_SITE_OTHER): Payer: Medicare Other | Admitting: Family

## 2020-04-22 ENCOUNTER — Other Ambulatory Visit: Payer: Self-pay

## 2020-04-22 VITALS — BP 112/80 | HR 85 | Temp 97.8°F | Ht 62.0 in | Wt 116.2 lb

## 2020-04-22 DIAGNOSIS — E210 Primary hyperparathyroidism: Secondary | ICD-10-CM | POA: Diagnosis not present

## 2020-04-22 DIAGNOSIS — R634 Abnormal weight loss: Secondary | ICD-10-CM | POA: Diagnosis not present

## 2020-04-22 DIAGNOSIS — R1310 Dysphagia, unspecified: Secondary | ICD-10-CM | POA: Diagnosis not present

## 2020-04-22 LAB — COMPREHENSIVE METABOLIC PANEL
ALT: 72 U/L — ABNORMAL HIGH (ref 0–35)
AST: 33 U/L (ref 0–37)
Albumin: 4.2 g/dL (ref 3.5–5.2)
Alkaline Phosphatase: 89 U/L (ref 39–117)
BUN: 14 mg/dL (ref 6–23)
CO2: 28 mEq/L (ref 19–32)
Calcium: 10.7 mg/dL — ABNORMAL HIGH (ref 8.4–10.5)
Chloride: 99 mEq/L (ref 96–112)
Creatinine, Ser: 0.74 mg/dL (ref 0.40–1.20)
GFR: 76.65 mL/min (ref >60.00)
Glucose, Bld: 174 mg/dL — ABNORMAL HIGH (ref 70–99)
Potassium: 4.1 mEq/L (ref 3.5–5.1)
Sodium: 134 mEq/L — ABNORMAL LOW (ref 135–145)
Total Bilirubin: 1.1 mg/dL (ref 0.2–1.2)
Total Protein: 6.9 g/dL (ref 6.0–8.3)

## 2020-04-22 LAB — CBC WITH DIFFERENTIAL/PLATELET
Basophils Absolute: 0 10*3/uL (ref 0.0–0.1)
Basophils Relative: 0.5 % (ref 0.0–3.0)
Eosinophils Absolute: 0 10*3/uL (ref 0.0–0.7)
Eosinophils Relative: 0.3 % (ref 0.0–5.0)
HCT: 42.1 % (ref 36.0–46.0)
Hemoglobin: 14.9 g/dL (ref 12.0–15.0)
Lymphocytes Relative: 16.6 % (ref 12.0–46.0)
Lymphs Abs: 1.5 10*3/uL (ref 0.7–4.0)
MCHC: 35.5 g/dL (ref 30.0–36.0)
MCV: 91.4 fl (ref 78.0–100.0)
Monocytes Absolute: 0.5 10*3/uL (ref 0.1–1.0)
Monocytes Relative: 5.9 % (ref 3.0–12.0)
Neutro Abs: 6.7 10*3/uL (ref 1.4–7.7)
Neutrophils Relative %: 76.7 % (ref 43.0–77.0)
Platelets: 235 10*3/uL (ref 150.0–400.0)
RBC: 4.61 Mil/uL (ref 3.87–5.11)
RDW: 13.3 % (ref 11.5–15.5)
WBC: 8.8 10*3/uL (ref 4.0–10.5)

## 2020-04-22 LAB — TSH: TSH: 0.96 u[IU]/mL (ref 0.35–4.50)

## 2020-04-22 MED ORDER — OMEPRAZOLE 40 MG PO CPDR: 40.0000 mg | DELAYED_RELEASE_CAPSULE | Freq: Every day | ORAL | 3 refills | Status: DC

## 2020-04-22 NOTE — Progress Notes (Signed)
Brandi Walsh is a 74 y.o. female with the following history as recorded in Epic[employer]:  Patient Active Problem List   Diagnosis Date Noted  . 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  . Abnormal liver enzymes 09/19/2019  . Blurred vision 09/19/2019  . Hypercalcemia 03/15/2019  . Major depression in full remission ([place]) 11/24/2018  . Memory loss 12/24/2016  . Encounter for well adult exam with abnormal findings 06/13/2013  . Hyperparathyroidism, primary ([place]) 06/12/2013  . GERD (gastroesophageal reflux disease) 02/19/2012  . 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  . COUGH DUE TO ACE INHIBITORS 11/25/2009  . Diabetes ([place]) 10/13/2007  . Dyslipidemia 10/13/2007  . Essential hypertension 10/13/2007  . MENOPAUSAL SYNDROME 10/13/2007  . Osteoporosis 10/13/2007    Current Outpatient Medications  Medication Sig Dispense Refill  . Cholecalciferol (VITAMIN D) 50 MCG (2000 UT) tablet Take 2,000 Units by mouth daily.    Marland Kitchen escitalopram (LEXAPRO) 10 MG tablet Take 1 tablet (10 mg total) by mouth daily. 90 tablet 0  . glucose blood (FREESTYLE LITE) test strip 1 each by Other route daily. Use as instructed 100 each 3  . losartan (COZAAR) 100 MG tablet Take 1 tablet by mouth once daily 90 tablet 0  . Multiple Vitamin (THERA) TABS Take by mouth.    . OLANZapine (ZYPREXA) 2.5 MG tablet Take 1 tablet (2.5 mg total) by mouth at bedtime. 90 tablet 0  . rosuvastatin (CRESTOR) 40 MG tablet Take 1 tablet (40 mg total) by mouth daily. 90 tablet 1  . omeprazole (PRILOSEC) 40 MG capsule Take 1 capsule (40 mg total) by mouth daily. 30 capsule 3   No current facility-administered medications for this visit.    Allergies: Lovastatin  Past Medical History:  Diagnosis Date  . COUGH DUE TO ACE INHIBITORS 11/25/2009   Qualifier: Diagnosis of  By: [name] MD, [name]   . Depression   . DIABETES MELLITUS, TYPE II 10/13/2007   Qualifier: Diagnosis of  By: Marca Ancona RMA, Lucy    . HYPERLIPIDEMIA 10/13/2007   Qualifier: Diagnosis of  By: Reatha Armour,  Lucy    . HYPERTENSION 10/13/2007   Qualifier: Diagnosis of  By: Reatha Armour, Lucy    . MENOPAUSAL SYNDROME 10/13/2007   Qualifier: Diagnosis of  By: Marca Ancona RMA, Lucy    . OSTEOPOROSIS 10/13/2007   Qualifier: Diagnosis of  By: Larose Kells      Past Surgical History:  Procedure Laterality Date  . ABDOMINAL HYSTERECTOMY    . BREAST BIOPSY  08/2002  . TONSILLECTOMY      Family History  Problem Relation Age of Onset  . Anxiety disorder Mother   . Hyperparathyroidism Sister   . Cancer Neg Hx   . Breast cancer Neg Hx     Social History   Tobacco Use  . Smoking status: Never Smoker  . Smokeless tobacco: Never Used  Substance Use Topics  . Alcohol use: No    Subjective:  Accompanied by granddaughter; notes has been having problems with swallowing/ feeling like food getting stuck; notes that on Saturday choked so badly her husband had to help her. Has taken Omeprazole in the past- not sure if she is actually taking the medication.  Notes that anxiety is increased right now- granddaughter feels that she is not eating well due to anxiety and this explains the weight loss.     Objective:  Vitals:   04/22/20 0943  BP: 112/80  Pulse: 85  Temp: 97.8 F (36.6 C)  TempSrc: Oral  SpO2: 99%  Weight: 116 lb  3.2 oz (52.7 kg)  Height: 5' 2"  (1.575 m)    General: Well developed, well nourished, in no acute distress  Skin : Warm and dry.  Head: Normocephalic and atraumatic  Eyes: Sclera and conjunctiva clear; pupils round and reactive to light; extraocular movements intact  Oropharynx: Pink, supple. No suspicious lesions  Neck: Supple without thyromegaly, adenopathy  Lungs: Respirations unlabored; clear to auscultation bilaterally without wheeze, rales, rhonchi  CVS exam: normal rate and regular rhythm.  Neurologic: Alert and oriented; speech intact; face symmetrical; moves all extremities well; CNII-XII intact without focal deficit   Assessment:  1. Dysphagia, unspecified type    2. Weight loss   3. Hyperparathyroidism, primary ([place])     Plan:  1. Re-start Omeprazole 40 mg daily; refer to GI to discuss endoscopy/ dilatation;  2. Encouraged to try and make sure to eat regular meals; granddaughter says family members are trying to monitor this; discussed use of Ensure or Boost as well for nutritional supplements; 3. Update labs today;  This visit occurred during the SARS-CoV-2 public health emergency.  Safety protocols were in place, including screening questions prior to the visit, additional usage of staff PPE, and extensive cleaning of exam room while observing appropriate contact time as indicated for disinfecting solutions.     No follow-ups on file.  Orders Placed This Encounter  Procedures  . CBC with Differential/Platelet  . Comp Met (CMET)  . PTH, Intact and Calcium  . TSH  . Ambulatory referral to Gastroenterology    Referral Priority:   Routine    Referral Type:   Consultation    Referral Reason:   Specialty Services Required    Number of Visits Requested:   1    Requested Prescriptions   Signed Prescriptions Disp Refills  . omeprazole (PRILOSEC) 40 MG capsule 30 capsule 3    Sig: Take 1 capsule (40 mg total) by mouth daily.

## 2020-04-23 ENCOUNTER — Other Ambulatory Visit (INDEPENDENT_AMBULATORY_CARE_PROVIDER_SITE_OTHER): Payer: Medicare Other

## 2020-04-23 DIAGNOSIS — R7309 Other abnormal glucose: Secondary | ICD-10-CM | POA: Diagnosis not present

## 2020-04-23 LAB — HEMOGLOBIN A1C: Hgb A1c MFr Bld: 5.6 % (ref 4.6–6.5)

## 2020-04-24 ENCOUNTER — Other Ambulatory Visit: Payer: Self-pay | Admitting: Family

## 2020-04-24 DIAGNOSIS — R7989 Other specified abnormal findings of blood chemistry: Secondary | ICD-10-CM

## 2020-04-25 LAB — PTH, INTACT AND CALCIUM: Calcium: 10.4 mg/dL (ref 8.6–10.4)

## 2020-04-26 ENCOUNTER — Other Ambulatory Visit: Payer: Self-pay | Admitting: Family

## 2020-04-26 DIAGNOSIS — E210 Primary hyperparathyroidism: Secondary | ICD-10-CM

## 2020-04-27 ENCOUNTER — Other Ambulatory Visit: Payer: Self-pay | Admitting: Family

## 2020-05-02 ENCOUNTER — Other Ambulatory Visit: Payer: Self-pay | Admitting: Family

## 2020-05-06 ENCOUNTER — Other Ambulatory Visit: Payer: Self-pay

## 2020-05-06 ENCOUNTER — Encounter: Payer: Self-pay | Admitting: Psychiatry

## 2020-05-06 ENCOUNTER — Ambulatory Visit (INDEPENDENT_AMBULATORY_CARE_PROVIDER_SITE_OTHER): Payer: Medicare Other | Admitting: Psychiatry

## 2020-05-06 DIAGNOSIS — F23 Brief psychotic disorder: Secondary | ICD-10-CM | POA: Diagnosis not present

## 2020-05-06 DIAGNOSIS — F419 Anxiety disorder, unspecified: Secondary | ICD-10-CM

## 2020-05-06 DIAGNOSIS — F324 Major depressive disorder, single episode, in partial remission: Secondary | ICD-10-CM | POA: Diagnosis not present

## 2020-05-06 MED ORDER — ESCITALOPRAM OXALATE 10 MG PO TABS: 10.0000 mg | ORAL_TABLET | Freq: Every day | ORAL | 0 refills | Status: DC

## 2020-05-06 MED ORDER — OLANZAPINE 2.5 MG PO TABS: 2.5000 mg | ORAL_TABLET | Freq: Every day | ORAL | 0 refills | Status: DC

## 2020-05-06 NOTE — Progress Notes (Signed)
   05/06/20 1446  Facial and Oral Movements  Muscles of Facial Expression 0  Lips and Perioral Area 0  Jaw 2  Tongue 0  Extremity Movements  Upper (arms, wrists, hands, fingers) 0  Lower (legs, knees, ankles, toes) 0  Trunk Movements  Neck, shoulders, hips 0  Overall Severity  Severity of abnormal movements (highest score from questions above) 0  Incapacitation due to abnormal movements 0  Patient's awareness of abnormal movements (rate only patient's report) 4  Dental Status  Current problems with teeth and/or dentures? Yes  Does patient usually wear dentures? Yes  AIMS Total Score  AIMS Total Score 6

## 2020-05-06 NOTE — Patient Instructions (Signed)
Donepezil tablets What is this medicine? DONEPEZIL (doe NEP e zil) is used to treat mild to moderate dementia caused by Alzheimer's disease. This medicine may be used for other purposes; ask your health care provider or pharmacist if you have questions. COMMON BRAND NAME(S): Aricept What should I tell my health care provider before I take this medicine? They need to know if you have any of these conditions:  asthma or other lung disease  difficulty passing urine  head injury  heart disease  history of irregular heartbeat  liver disease  seizures (convulsions)  stomach or intestinal disease, ulcers or stomach bleeding  an unusual or allergic reaction to donepezil, other medicines, foods, dyes, or preservatives  pregnant or trying to get pregnant  breast-feeding How should I use this medicine? Take this medicine by mouth with a glass of water. Follow the directions on the prescription label. You may take this medicine with or without food. Take this medicine at regular intervals. This medicine is usually taken before bedtime. Do not take it more often than directed. Continue to take your medicine even if you feel better. Do not stop taking except on your doctor's advice. If you are taking the 23 mg donepezil tablet, swallow it whole; do not cut, crush, or chew it. Talk to your pediatrician regarding the use of this medicine in children. Special care may be needed. Overdosage: If you think you have taken too much of this medicine contact a poison control center or emergency room at once. NOTE: This medicine is only for you. Do not share this medicine with others. What if I miss a dose? If you miss a dose, take it as soon as you can. If it is almost time for your next dose, take only that dose, do not take double or extra doses. What may interact with this medicine? Do not take this medicine with any of the following medications:  certain medicines for fungal infections like  itraconazole, fluconazole, posaconazole, and voriconazole  cisapride  dextromethorphan; quinidine  dronedarone  pimozide  quinidine  thioridazine This medicine may also interact with the following medications:  antihistamines for allergy, cough and cold  atropine  bethanechol  carbamazepine  certain medicines for bladder problems like oxybutynin, tolterodine  certain medicines for Parkinson's disease like benztropine, trihexyphenidyl  certain medicines for stomach problems like dicyclomine, hyoscyamine  certain medicines for travel sickness like scopolamine  dexamethasone  dofetilide  ipratropium  NSAIDs, medicines for pain and inflammation, like ibuprofen or naproxen  other medicines for Alzheimer's disease  other medicines that prolong the QT interval (cause an abnormal heart rhythm)  phenobarbital  phenytoin  rifampin, rifabutin or rifapentine  ziprasidone This list may not describe all possible interactions. Give your health care provider a list of all the medicines, herbs, non-prescription drugs, or dietary supplements you use. Also tell them if you smoke, drink alcohol, or use illegal drugs. Some items may interact with your medicine. What should I watch for while using this medicine? Visit your doctor or health care professional for regular checks on your progress. Check with your doctor or health care professional if your symptoms do not get better or if they get worse. You may get drowsy or dizzy. Do not drive, use machinery, or do anything that needs mental alertness until you know how this drug affects you. What side effects may I notice from receiving this medicine? Side effects that you should report to your doctor or health care professional as soon as possible:    allergic reactions like skin rash, itching or hives, swelling of the face, lips, or tongue  feeling faint or lightheaded, falls  loss of bladder control  seizures  signs and  symptoms of a dangerous change in heartbeat or heart rhythm like chest pain; dizziness; fast or irregular heartbeat; palpitations; feeling faint or lightheaded, falls; breathing problems  signs and symptoms of infection like fever or chills; cough; sore throat; pain or trouble passing urine  signs and symptoms of liver injury like dark yellow or brown urine; general ill feeling or flu-like symptoms; light-colored stools; loss of appetite; nausea; right upper belly pain; unusually weak or tired; yellowing of the eyes or skin  slow heartbeat or palpitations  unusual bleeding or bruising  vomiting Side effects that usually do not require medical attention (report to your doctor or health care professional if they continue or are bothersome):  diarrhea, especially when starting treatment  headache  loss of appetite  muscle cramps  nausea  stomach upset This list may not describe all possible side effects. Call your doctor for medical advice about side effects. You may report side effects to FDA at 1-800-FDA-1088. Where should I keep my medicine? Keep out of reach of children. Store at room temperature between 15 and 30 degrees C (59 and 86 degrees F). Throw away any unused medicine after the expiration date. NOTE: This sheet is a summary. It may not cover all possible information. If you have questions about this medicine, talk to your doctor, pharmacist, or health care provider.  2020 Elsevier/Gold Standard (2018-11-07 10:33:41)  

## 2020-05-06 NOTE — Progress Notes (Signed)
Brandi Walsh [id number] [birth year] 74 y.o.  Subjective:   Patient ID:  Brandi Walsh is a 74 y.o. (DOB 1946/11/07) female.  Chief Complaint:  Chief Complaint  Patient presents with   Follow-up    h/o depression, anxiety, psychosis    HPI Brandi Walsh presents to the office today for follow-up of depression and h/o psychosis. She is accompanied by her granddaughter, Brandi Walsh. They report that she has been losing weight. She reports that she has been trying to gain weight. She reports that her appetite has been ok. Granddaughter reports that pt typically eats light meals. She reports that she has been having difficulty swallowing and that her partial is not fitting as well and these factors may interfere with gaining weight. Not drinking as many protein drinks. Pt reports that she experiences "brain fog... I can get a little confused." She reports that she may forget plans and schedules for the day. She reports occasional disorientation. Energy has been "a little slow." Motivation has been ok. She reports that she has some difficulty with concentration on occasion. Denies diminished interest. Denies SI.   Granddaughter reports that they have noticed a steady decline in cognition but not a sudden change. Granddaughter notices some episodic anxiety and that other s/s have improved and that she continues to experience some anxiety. Pt reports that anxiety "just shows up, I might be thinking too much." She reports that she occasionally experiencing increased heart rate with severe anxiety. They report that she does not rest well the night before an appointment. Denies depressed mood.   Pt has been managing her own medications.   Past Psychiatric Medication Trials: Lexapro Cymbalta Risperdal Olanzapine Remeron- Vivid dreams, nightmares   AIMS     Office Visit from 05/06/2020 in [facility] Office Visit from 08/08/2019 in [facility]  AIMS Total Score  6  1     PHQ2-9     Office Visit from 09/19/2019 in [facility]  PHQ-2 Total Score  0       Review of Systems:  Review of Systems  Cardiovascular: Positive for leg swelling.       Granddaughter notices intermittent lower extremity edema  Genitourinary: Negative for frequency.  Musculoskeletal: Negative for gait problem.  Neurological: Negative for tremors.  Psychiatric/Behavioral:       Please refer to HPI    Medications: I have reviewed the patient's current medications.  Current Outpatient Medications  Medication Sig Dispense Refill   Cholecalciferol (VITAMIN D) 50 MCG (2000 UT) tablet Take 2,000 Units by mouth daily.     losartan (COZAAR) 100 MG tablet Take 1 tablet by mouth once daily 90 tablet 0   omeprazole (PRILOSEC) 40 MG capsule Take 1 capsule (40 mg total) by mouth daily. 30 capsule 3   rosuvastatin (CRESTOR) 40 MG tablet Take 1 tablet (40 mg total) by mouth daily. 90 tablet 1   escitalopram (LEXAPRO) 10 MG tablet Take 1 tablet (10 mg total) by mouth daily. 90 tablet 0   glucose blood (FREESTYLE LITE) test strip 1 each by Other route daily. Use as instructed 100 each 3   Multiple Vitamin (THERA) TABS Take by mouth.     OLANZapine (ZYPREXA) 2.5 MG tablet Take 1 tablet (2.5 mg total) by mouth at bedtime. 90 tablet 0   No current facility-administered medications for this visit.    Medication Side Effects: Other: Involuntary jaw movements, decreased urination  Allergies:  Allergies  Allergen Reactions  Lovastatin     REACTION: Rash    Past Medical History:  Diagnosis Date   COUGH DUE TO ACE INHIBITORS 11/25/2009   Qualifier: Diagnosis of  By: Everardo All MD, Sean A    Depression    DIABETES MELLITUS, TYPE II 10/13/2007   Qualifier: Diagnosis of  By: Charlsie Quest RMA, Lucy     HYPERLIPIDEMIA 10/13/2007   Qualifier: Diagnosis of  By: Tyrone Apple, Lucy     HYPERTENSION 10/13/2007   Qualifier: Diagnosis of  By: Samara Snide      MENOPAUSAL SYNDROME 10/13/2007   Qualifier: Diagnosis of  By: Charlsie Quest RMA, Lucy     OSTEOPOROSIS 10/13/2007   Qualifier: Diagnosis of  By: Charlsie Quest RMA, Lucy      Family History  Problem Relation Age of Onset   Anxiety disorder Mother    Hyperparathyroidism Sister    Cancer Neg Hx    Breast cancer Neg Hx     Social History   Socioeconomic History   Marital status: Married    Spouse name: Not on file   Number of children: Not on file   Years of education: Not on file   Highest education level: Not on file  Occupational History   Occupation: Retired   Tobacco Use   Smoking status: Never Smoker   Smokeless tobacco: Never Used  Substance and Sexual Activity   Alcohol use: No   Drug use: No   Sexual activity: Yes  Other Topics Concern   Not on file  Social History Narrative   Not on file   Social Determinants of Health   Financial Resource Strain:    Difficulty of Paying Living Expenses:   Food Insecurity:    Worried About Programme researcher, broadcasting/film/video in the Last Year:    Barista in the Last Year:   Transportation Needs:    Freight forwarder (Medical):    Lack of Transportation (Non-Medical):   Physical Activity:    Days of Exercise per Week:    Minutes of Exercise per Session:   Stress:    Feeling of Stress :   Social Connections:    Frequency of Communication with Friends and Family:    Frequency of Social Gatherings with Friends and Family:    Attends Religious Services:    Active Member of Clubs or Organizations:    Attends Engineer, structural:    Marital Status:   Intimate Partner Violence:    Fear of Current or Ex-Partner:    Emotionally Abused:    Physically Abused:    Sexually Abused:     Past Medical History, Surgical history, Social history, and Family history were reviewed and updated as appropriate.   Please see review of systems for further details on the patient's review from today.   Objective:    Physical Exam:  Wt 114 lb (51.7 kg)    BMI 20.85 kg/m   Physical Exam Constitutional:      General: She is not in acute distress. Musculoskeletal:        General: No deformity.  Neurological:     Mental Status: She is alert and oriented to person, place, and time.     Coordination: Coordination normal.  Psychiatric:        Attention and Perception: Attention and perception normal. She does not perceive auditory or visual hallucinations.        Mood and Affect: Mood is not depressed. Affect is not labile, blunt, angry or inappropriate.  Speech: Speech normal.        Behavior: Behavior normal. Behavior is cooperative.        Thought Content: Thought content normal. Thought content is not paranoid or delusional. Thought content does not include homicidal or suicidal ideation. Thought content does not include homicidal or suicidal plan.        Cognition and Memory: Memory is impaired.        Judgment: Judgment normal.     Comments: Insight intact Mood presents as slightly anxious     Lab Review:     Component Value Date/Time   NA 134 (L) 04/22/2020 1019   K 4.1 04/22/2020 1019   CL 99 04/22/2020 1019   CO2 28 04/22/2020 1019   GLUCOSE 174 (H) 04/22/2020 1019   BUN 14 04/22/2020 1019   CREATININE 0.74 04/22/2020 1019   CREATININE 0.82 02/13/2019 1031   CALCIUM 10.7 (H) 04/22/2020 1019   CALCIUM 10.4 04/22/2020 1019   CALCIUM 10.8 (H) 02/19/2012 1129   PROT 6.9 04/22/2020 1019   ALBUMIN 4.2 04/22/2020 1019   AST 33 04/22/2020 1019   ALT 72 (H) 04/22/2020 1019   ALKPHOS 89 04/22/2020 1019   BILITOT 1.1 04/22/2020 1019   GFRNONAA >60 01/15/2017 0941   GFRAA >60 01/15/2017 0941       Component Value Date/Time   WBC 8.8 04/22/2020 1019   RBC 4.61 04/22/2020 1019   HGB 14.9 04/22/2020 1019   HGB 15.3 12/16/2010 1045   HCT 42.1 04/22/2020 1019   HCT 43.8 12/16/2010 1045   PLT 235.0 04/22/2020 1019   PLT 224 12/16/2010 1045   MCV 91.4 04/22/2020 1019   MCV  88.9 12/16/2010 1045   MCH 32.6 01/15/2017 0941   MCHC 35.5 04/22/2020 1019   RDW 13.3 04/22/2020 1019   RDW 12.5 12/16/2010 1045   LYMPHSABS 1.5 04/22/2020 1019   LYMPHSABS 2.1 12/16/2010 1045   MONOABS 0.5 04/22/2020 1019   MONOABS 0.8 12/16/2010 1045   EOSABS 0.0 04/22/2020 1019   EOSABS 0.3 12/16/2010 1045   BASOSABS 0.0 04/22/2020 1019   BASOSABS 0.1 12/16/2010 1045    No results found for: POCLITH, LITHIUM   No results found for: PHENYTOIN, PHENOBARB, VALPROATE, CBMZ   .res Assessment: Plan:   Pt seen for 30 minutes and time spent reviewing neuropsychological evaluation and discussing tx options with pt and her granddaughter. Discussed recommendations from neuropsychological evaluation to consider a cholinesterase inhibitor. Discussed potential benefits, risks, and side effects of Aricept and reviewed that she had been on Aricept in the past and seemed to tolerate it well. They report that they would prefer not to start Aricept at this time due to concern that risks may outweigh benefits. Granddaughter reports that pt typically has increased anxiety with taking additional medications. Provided written pt education re: Aricept for pt and family to review and encouraged them to notify provider if they would like to start it in the future.  Pt and her granddaughter report that they would like to continue current medications without changes at this time since they feel that overall her mood and anxiety are stable.  Pt to f/u in 3 months or sooner if clinically indicated.  Patient advised to contact office with any questions, adverse effects, or acute worsening in signs and symptoms.  Brandi Walsh was seen today for follow-up.  Diagnoses and all orders for this visit:  Major depressive disorder in partial remission, unspecified whether recurrent (H[facility]) -     escitalopram (LEXAPRO) 10 MG  tablet; Take 1 tablet (10 mg total) by mouth daily.  Anxiety disorder, unspecified type -      escitalopram (LEXAPRO) 10 MG tablet; Take 1 tablet (10 mg total) by mouth daily.  Brief psychotic disorder (HCC) -     OLANZapine (ZYPREXA) 2.5 MG tablet; Take 1 tablet (2.5 mg total) by mouth at bedtime.     Please see After Visit Summary for patient specific instructions.  Future Appointments  Date Time Provider Department Center  05/15/2020 11:30 AM Unk Lightning, PA [facility]  08/06/2020  1:00 PM [name], PMHNP [facility] None    No orders of the defined types were placed in this encounter.   -------------------------------

## 2020-05-15 ENCOUNTER — Encounter: Payer: Self-pay | Admitting: Neurology

## 2020-05-15 ENCOUNTER — Ambulatory Visit: Payer: Medicare Other | Admitting: Physician Assistant

## 2020-05-24 ENCOUNTER — Encounter: Payer: Self-pay | Admitting: Family

## 2020-05-24 ENCOUNTER — Telehealth: Payer: Self-pay | Admitting: Psychiatry

## 2020-05-24 DIAGNOSIS — R4182 Altered mental status, unspecified: Secondary | ICD-10-CM

## 2020-05-24 NOTE — Telephone Encounter (Signed)
[name], Kaysen's granddaughter, called to report that her grandmother seems to be hearing and seeing things that aren't real.  She is going to go over to her house today and verify that she is taking her psych meds and taking them properly.  She would a call to discuss what could be causing the hallucinations.  [phone].  If you call this afternoon, she should also be able to report on whether or not her grandmother is taking her medications correctly.

## 2020-05-24 NOTE — Telephone Encounter (Signed)
Returned call to [place]. She is questioning if pt is taking medications as prescribed although there is no evidence to support that pt is not taking medication. Pt has medication bottles that have been filled this month and are partially full.  [name] reports that pt has "good days and bad days." [name] reports that pt's husband said yesterday that she believed he had told a neighbor something that would upset the neighbor with her. [name] reports that pt was somewhat resistant to her assisting with managing medications and eventually agreed to accept [name]'s help. [name] reports that pt has seemed to be somewhat confused. No reports of AH or VH. No distress or signs of infection. Granddaughter reports that pt wears the same clothing all the time and this is not characteristic of her. [name] reports that pt will say she does not have anything to wear.   Plan: Will order CMP to rule out hyponatremia since pt has had psychotic s/s in the past when sodium levels have been low (sodium was 134 on 04/22/20). Encouraged granddaughter to continue to monitor closely for any other possible psychotic s/s, worsening confusion, or possible s/s of infection and to ensure that pt is taking medications consistently. May consider increase in Olanzapine to 5 mg po QHS if pt exhibits any additional possible psychotic s/s and medical causes have been ruled out. Will contact [name] with results of CMP and will notify PCP if sodium level is low.

## 2020-05-28 ENCOUNTER — Telehealth: Payer: Self-pay | Admitting: Psychiatry

## 2020-05-28 LAB — COMPREHENSIVE METABOLIC PANEL
AG Ratio: 1.7 (calc) (ref 1.0–2.5)
ALT: 32 U/L — ABNORMAL HIGH (ref 6–29)
AST: 23 U/L (ref 10–35)
Albumin: 4.4 g/dL (ref 3.6–5.1)
Alkaline phosphatase (APISO): 93 U/L (ref 37–153)
BUN: 17 mg/dL (ref 7–25)
CO2: 27 mmol/L (ref 20–32)
Calcium: 10.9 mg/dL — ABNORMAL HIGH (ref 8.6–10.4)
Chloride: 99 mmol/L (ref 98–110)
Creat: 0.8 mg/dL (ref 0.60–0.93)
Globulin: 2.6 g/dL (calc) (ref 1.9–3.7)
Glucose, Bld: 120 mg/dL (ref 65–139)
Potassium: 3.8 mmol/L (ref 3.5–5.3)
Sodium: 135 mmol/L (ref 135–146)
Total Bilirubin: 1.2 mg/dL (ref 0.2–1.2)
Total Protein: 7 g/dL (ref 6.1–8.1)

## 2020-05-28 NOTE — Telephone Encounter (Signed)
Tried to reach [place] but it went to voicemail and the mail box was full. Will try again later.

## 2020-05-28 NOTE — Telephone Encounter (Signed)
Pt's granddaughter wants to know if pt needs to fast before going to get blood work done. Please call.

## 2020-05-29 NOTE — Telephone Encounter (Signed)
Patient has had labs see note

## 2020-06-05 ENCOUNTER — Telehealth: Payer: Self-pay | Admitting: Psychiatry

## 2020-06-05 ENCOUNTER — Other Ambulatory Visit: Payer: Self-pay | Admitting: Adult Health

## 2020-06-05 DIAGNOSIS — R4182 Altered mental status, unspecified: Secondary | ICD-10-CM

## 2020-06-05 MED ORDER — DONEPEZIL HCL 5 MG PO TABS: 5.0000 mg | ORAL_TABLET | Freq: Every day | ORAL | 2 refills | Status: DC

## 2020-06-05 NOTE — Telephone Encounter (Signed)
I sent in the 5mg  at hs.

## 2020-06-05 NOTE — Telephone Encounter (Signed)
Brandi Walsh is aware and to follow up with concerns or questions if needed.

## 2020-06-05 NOTE — Telephone Encounter (Signed)
Grand daughter called and said that pt would like a rx for Aricept called in to [facility] on [address]. She said it was offered at pt's last visit.

## 2020-06-07 ENCOUNTER — Encounter: Payer: Self-pay | Admitting: Nurse Practitioner

## 2020-06-07 ENCOUNTER — Encounter: Payer: Self-pay | Admitting: Gastroenterology

## 2020-06-07 ENCOUNTER — Ambulatory Visit: Payer: Medicare Other | Admitting: Nurse Practitioner

## 2020-06-07 ENCOUNTER — Other Ambulatory Visit (INDEPENDENT_AMBULATORY_CARE_PROVIDER_SITE_OTHER): Payer: Medicare Other

## 2020-06-07 VITALS — BP 101/76 | HR 78 | Ht 60.0 in | Wt 108.5 lb

## 2020-06-07 DIAGNOSIS — R1310 Dysphagia, unspecified: Secondary | ICD-10-CM

## 2020-06-07 DIAGNOSIS — R945 Abnormal results of liver function studies: Secondary | ICD-10-CM

## 2020-06-07 DIAGNOSIS — R7989 Other specified abnormal findings of blood chemistry: Secondary | ICD-10-CM | POA: Diagnosis not present

## 2020-06-07 DIAGNOSIS — E210 Primary hyperparathyroidism: Secondary | ICD-10-CM

## 2020-06-07 LAB — HEPATIC FUNCTION PANEL
ALT: 41 U/L — ABNORMAL HIGH (ref 0–35)
AST: 29 U/L (ref 0–37)
Albumin: 4.4 g/dL (ref 3.5–5.2)
Alkaline Phosphatase: 81 U/L (ref 39–117)
Bilirubin, Direct: 0.3 mg/dL (ref 0.0–0.3)
Total Bilirubin: 1.2 mg/dL (ref 0.2–1.2)
Total Protein: 7.1 g/dL (ref 6.0–8.3)

## 2020-06-07 NOTE — Patient Instructions (Signed)
If you are age 74 or older, your body mass index should be between 23-30. Your Body mass index is 21.19 kg/m. If this is out of the aforementioned range listed, please consider follow up with your Primary Care Provider.  If you are age 37 or younger, your body mass index should be between 19-25. Your Body mass index is 21.19 kg/m. If this is out of the aformentioned range listed, please consider follow up with your Primary Care Provider.   You have been scheduled for an endoscopy. Please follow written instructions given to you at your visit today. If you use inhalers (even only as needed), please bring them with you on the day of your procedure. Your physician has requested that you go to www.startemmi.com and enter the access code given to you at your visit today. This web site gives a general overview about your procedure. However, you should still follow specific instructions given to you by our office regarding your preparation for the procedure.  Due to recent changes in healthcare laws, you may see the results of your imaging and laboratory studies on MyChart before your provider has had a chance to review them.  We understand that in some cases there may be results that are confusing or concerning to you. Not all laboratory results come back in the same time frame and the provider may be waiting for multiple results in order to interpret others.  Please give [place] 48 hours in order for your provider to thoroughly review all the results before contacting the office for clarification of your results.

## 2020-06-07 NOTE — Progress Notes (Signed)
ASSESSMENT / PLAN:    Brandi Walsh is a 74 y.o. female PMH significant for,  but not necessarily limited to diabetes, hypertension, osteoporosis, depression    # Solid food dysphagia with associated weight loss --Rule out stricture, neoplasm --Further evaluation patient will be scheduled for EGD. The risks and benefits of EGD were discussed and the patient agrees to proceed.  --Advised patient to eat small bites, chew well with liquids in between bites to avoid food impaction.  #Elevated LFTs --Intermittent mild elevations in ALT over last several months.  Except for one occasion in September 2020, ALT has been less than 3 x ULN --Negative Viral hepatitis B and C October 2020.  She has not had any abdominal imaging --Patient will follow up with me after EGD at which time we will proceed with work-up likely to include RUQ ultrasound and additional labs looking for markers of chronic liver disease   HPI:     Chief Complaint: Swallowing problems   Brandi Walsh is a 74 year old female last seen in 2007 by Dr. Juanda Chance.  She is referred by PCP for evaluation of dysphagia.  Husband and granddaughter help provide history.  Family concerned about possible mild dementia and patient.  Problems swallowing solids started a few months back, seems to be getting worse.  She had a choking episode 2 months ago where husband had to perform Heimlich maneuver.  No " choking" episodes since but still continues to feel like food may get stuck as she is swallowing.  Odynophagia.  Her weight is down from 122 pounds late March to 108 pounds today.  No problems swallowing liquids.  Patient has no history of tobacco use.  She has no GERD history.  Last colonoscopy was in 2007.  Granddaughter tells me that it was implied that she would not need another one.  Patient has no bowel changes nor blood in stool.  Denies family history of colon cancer.   Past Medical History:  Diagnosis Date  . COUGH  DUE TO ACE INHIBITORS 11/25/2009   Qualifier: Diagnosis of  By: Everardo All MD, Cleophas Dunker   . Depression   . DIABETES MELLITUS, TYPE II 10/13/2007   Qualifier: Diagnosis of  By: Charlsie Quest RMA, Lucy    . HYPERLIPIDEMIA 10/13/2007   Qualifier: Diagnosis of  By: Tyrone Apple, Lucy    . HYPERTENSION 10/13/2007   Qualifier: Diagnosis of  By: Tyrone Apple, Lucy    . MENOPAUSAL SYNDROME 10/13/2007   Qualifier: Diagnosis of  By: Charlsie Quest RMA, Lucy    . OSTEOPOROSIS 10/13/2007   Qualifier: Diagnosis of  By: Samara Snide       Past Surgical History:  Procedure Laterality Date  . ABDOMINAL HYSTERECTOMY    . BREAST BIOPSY  08/2002  . TONSILLECTOMY     Family History  Problem Relation Age of Onset  . Anxiety disorder Mother   . Hyperparathyroidism Sister   . Cancer Neg Hx   . Breast cancer Neg Hx    Social History   Tobacco Use  . Smoking status: Never Smoker  . Smokeless tobacco: Never Used  Vaping Use  . Vaping Use: Never used  Substance Use Topics  . Alcohol use: No  . Drug use: No   Current Outpatient Medications  Medication Sig Dispense Refill  . Cholecalciferol (VITAMIN D) 50 MCG (2000 UT) tablet Take 2,000 Units by mouth daily.    Marland Kitchen donepezil (ARICEPT) 5 MG  tablet Take 1 tablet (5 mg total) by mouth at bedtime. 30 tablet 2  . escitalopram (LEXAPRO) 10 MG tablet Take 1 tablet (10 mg total) by mouth daily. 90 tablet 0  . losartan (COZAAR) 100 MG tablet Take 1 tablet by mouth once daily 90 tablet 0  . OLANZapine (ZYPREXA) 2.5 MG tablet Take 1 tablet (2.5 mg total) by mouth at bedtime. 90 tablet 0  . omeprazole (PRILOSEC) 40 MG capsule Take 1 capsule (40 mg total) by mouth daily. 30 capsule 3  . rosuvastatin (CRESTOR) 40 MG tablet Take 1 tablet (40 mg total) by mouth daily. 90 tablet 1   No current facility-administered medications for this visit.   Allergies  Allergen Reactions  . Lovastatin     REACTION: Rash     Review of Systems: Positive for anxiety, vision changes, depression  and swelling of feet and legs.  All other systems reviewed and negative except where noted in HPI.   Serum creatinine: 0.8 mg/dL 25/95/63 8756 Estimated creatinine clearance: 44.3 mL/min   Physical Exam:    Wt Readings from Last 3 Encounters:  06/07/20 108 lb 8 oz (49.2 kg)  04/22/20 116 lb 3.2 oz (52.7 kg)  02/21/20 122 lb (55.3 kg)    BP 101/76   Pulse 78   Ht 5' (1.524 m)   Wt 108 lb 8 oz (49.2 kg)   BMI 21.19 kg/m  Constitutional:  Pleasant thin female in no acute distress. Psychiatric: Normal mood and affect. Behavior is normal. EENT: Pupils normal.  Conjunctivae are normal. No scleral icterus. Neck supple.  Cardiovascular: Normal rate, regular rhythm. LLE 3+ edema, RLE 2 + edema Pulmonary/chest: Effort normal and breath sounds normal. No wheezing, rales or rhonchi. Abdominal: Soft, nondistended, nontender. Bowel sounds active throughout. There are no masses palpable. No hepatomegaly. Neurological: Alert and oriented to person place and time. Skin: Skin is warm and dry. No rashes noted.  Willette Cluster, NP  06/07/2020, 10:57 AM  Cc:  Referring Provider [name],*

## 2020-06-07 NOTE — Addendum Note (Signed)
Addended by: Mariane Duval on: 06/07/2020 02:18 PM   Modules accepted: Orders

## 2020-06-10 LAB — PTH, INTACT AND CALCIUM
Calcium: 10.8 mg/dL — ABNORMAL HIGH (ref 8.6–10.4)
PTH: 39 pg/mL (ref 14–64)

## 2020-06-11 ENCOUNTER — Other Ambulatory Visit: Payer: Self-pay

## 2020-06-11 ENCOUNTER — Ambulatory Visit (AMBULATORY_SURGERY_CENTER): Payer: Medicare Other | Admitting: Gastroenterology

## 2020-06-11 ENCOUNTER — Encounter: Payer: Self-pay | Admitting: Gastroenterology

## 2020-06-11 VITALS — BP 134/70 | HR 52 | Temp 96.4°F | Resp 14 | Ht 60.0 in | Wt 108.0 lb

## 2020-06-11 DIAGNOSIS — K317 Polyp of stomach and duodenum: Secondary | ICD-10-CM | POA: Diagnosis not present

## 2020-06-11 DIAGNOSIS — K222 Esophageal obstruction: Secondary | ICD-10-CM

## 2020-06-11 DIAGNOSIS — R1310 Dysphagia, unspecified: Secondary | ICD-10-CM | POA: Diagnosis not present

## 2020-06-11 DIAGNOSIS — K2970 Gastritis, unspecified, without bleeding: Secondary | ICD-10-CM

## 2020-06-11 MED ORDER — SODIUM CHLORIDE 0.9 % IV SOLN: 500.0000 mL | Freq: Once | INTRAVENOUS | Status: DC

## 2020-06-11 NOTE — Patient Instructions (Signed)
Discharge instructions given. Biopsies taken. Handout on a dilatation diet. Follow up with Willette Cluster NP in 6-8 weeks at appointment to be scheduled. Resume previous medications. YOU HAD AN ENDOSCOPIC PROCEDURE TODAY AT THE [facility]:   Refer to the procedure report that was given to you for any specific questions about what was found during the examination.  If the procedure report does not answer your questions, please call your gastroenterologist to clarify.  If you requested that your care partner not be given the details of your procedure findings, then the procedure report has been included in a sealed envelope for you to review at your convenience later.  YOU SHOULD EXPECT: Some feelings of bloating in the abdomen. Passage of more gas than usual.  Walking can help get rid of the air that was put into your GI tract during the procedure and reduce the bloating. If you had a lower endoscopy (such as a colonoscopy or flexible sigmoidoscopy) you may notice spotting of blood in your stool or on the toilet paper. If you underwent a bowel prep for your procedure, you may not have a normal bowel movement for a few days.  Please Note:  You might notice some irritation and congestion in your nose or some drainage.  This is from the oxygen used during your procedure.  There is no need for concern and it should clear up in a day or so.  SYMPTOMS TO REPORT IMMEDIATELY:    Following upper endoscopy (EGD)  Vomiting of blood or coffee ground material  New chest pain or pain under the shoulder blades  Painful or persistently difficult swallowing  New shortness of breath  Fever of 100F or higher  Black, tarry-looking stools  For urgent or emergent issues, a gastroenterologist can be reached at any hour by calling [phone]. Do not use MyChart messaging for urgent concerns.    DIET:  We do recommend a small meal at first, but then you may proceed to your regular diet.  Drink  plenty of fluids but you should avoid alcoholic beverages for 24 hours.  ACTIVITY:  You should plan to take it easy for the rest of today and you should NOT DRIVE or use heavy machinery until tomorrow (because of the sedation medicines used during the test).    FOLLOW UP: Our staff will call the number listed on your records 48-72 hours following your procedure to check on you and address any questions or concerns that you may have regarding the information given to you following your procedure. If we do not reach you, we will leave a message.  We will attempt to reach you two times.  During this call, we will ask if you have developed any symptoms of COVID 19. If you develop any symptoms (ie: fever, flu-like symptoms, shortness of breath, cough etc.) before then, please call [phone].  If you test positive for Covid 19 in the 2 weeks post procedure, please call and report this information to [place].    If any biopsies were taken you will be contacted by phone or by letter within the next 1-3 weeks.  Please call us at [phone] if you have not heard about the biopsies in 3 weeks.    SIGNATURES/CONFIDENTIALITY: You and/or your care partner have signed paperwork which will be entered into your electronic medical record.  These signatures attest to the fact that that the information above on your After Visit Summary has been reviewed and is understood.  Full  responsibility of the confidentiality of this discharge information lies with you and/or your care-partner.

## 2020-06-11 NOTE — Progress Notes (Signed)
Agree with the assessment and plan as outlined by Paula Guenther, NP. ° °Solace Wendorff, DO, FACG ° °

## 2020-06-11 NOTE — Progress Notes (Signed)
PT taken to PACU. Monitors in place. VSS. Report given to RN. 

## 2020-06-11 NOTE — Op Note (Signed)
[facility] Patient Name: Brandi Walsh Procedure Date: 06/11/2020 8:38 AM MRN: [id number] Endoscopist: Doristine Locks , MD Age: 74 Referring MD:  Date of Birth: February 06, 1946 Gender: Female Account #: [id number] Procedure:                Upper GI endoscopy Indications:              Dysphagia, Weight loss Medicines:                Monitored Anesthesia Care Procedure:                Pre-Anesthesia Assessment:                           - Prior to the procedure, a History and Physical                            was performed, and patient medications and                            allergies were reviewed. The patient's tolerance of                            previous anesthesia was also reviewed. The risks                            and benefits of the procedure and the sedation                            options and risks were discussed with the patient.                            All questions were answered, and informed consent                            was obtained. Prior Anticoagulants: The patient has                            taken no previous anticoagulant or antiplatelet                            agents. ASA Grade Assessment: III - A patient with                            severe systemic disease. After reviewing the risks                            and benefits, the patient was deemed in                            satisfactory condition to undergo the procedure.                           After obtaining informed consent, the endoscope was  passed under direct vision. Throughout the                            procedure, the patient's blood pressure, pulse, and                            oxygen saturations were monitored continuously. The                            Endoscope was introduced through the mouth, and                            advanced to the second part of duodenum. The upper                            GI endoscopy was accomplished  without difficulty.                            The patient tolerated the procedure well. Scope In: Scope Out: Findings:                 A mild Schatzki ring was found in the lower third                            of the esophagus. A TTS dilator was passed through                            the scope. Dilation with an 18-19-20 mm balloon                            dilator was performed to 20 mm. The inflated                            balloon was then dragged up through the esophagus                            to the level of the UES. The dilation site was                            examined and showed no bleeding, mucosal tear or                            perforation. This was then biopsied with a cold                            forceps for fracturing of the ring. Estimated blood                            loss was minimal.                           The upper third of the esophagus and middle third  of the esophagus were normal.                           A single 20 mm semi-pedunculated polyp with no                            stigmata of recent bleeding was found in the                            gastric antrum. The polyp was erythematous and had                            an area of apparent umbilication. Tunnelled                            biopsies were taken with a cold forceps for                            histology. Estimated blood loss was minimal.                           Multiple 2 to 4 mm sessile polyps with no bleeding                            and no stigmata of recent bleeding were found in                            the gastric fundus and in the gastric body. Several                            of these polyps were removed with a cold biopsy                            forceps for histologic representative evaluation.                            Resection and retrieval were complete. Estimated                            blood loss was minimal.                            Scattered mild inflammation characterized by                            erythema was found in the gastric fundus, in the                            gastric body, in the gastric antrum and in the                            prepyloric region of the stomach. Biopsies were  taken with a cold forceps for Helicobacter pylori                            testing. Estimated blood loss was minimal.                           The duodenal bulb, first portion of the duodenum                            and second portion of the duodenum were normal. Complications:            No immediate complications. Estimated Blood Loss:     Estimated blood loss was minimal. Impression:               - Mild Schatzki ring. Dilated. Biopsied.                           - Normal upper third of esophagus and middle third                            of esophagus.                           - A single gastric polyp. Biopsied.                           - Multiple gastric polyps. Resected and retrieved.                           - Gastritis. Biopsied.                           - Normal duodenal bulb, first portion of the                            duodenum and second portion of the duodenum. Recommendation:           - Patient has a contact number available for                            emergencies. The signs and symptoms of potential                            delayed complications were discussed with the                            patient. Return to normal activities tomorrow.                            Written discharge instructions were provided to the                            patient.                           - Soft food today, then resume previous diet  tomorrow.                           - Continue present medications.                           - Await pathology results.                           - Follow-up with Willette Cluster, NP in the GI                             clinic in 6-8 weeks at appointment to be scheduled.                           - Depending on pathology results of the larger                            antral polyp, may consider either Endoscopic                            Ultrasound with EMR vs observation.                           - If dysphagia symptoms persist despite today's                            intervention, may consider further evaluation with                            Esophageal Manometry. Doristine Locks, MD 06/11/2020 9:22:23 AM

## 2020-06-13 ENCOUNTER — Telehealth: Payer: Self-pay

## 2020-06-13 NOTE — Telephone Encounter (Signed)
  Follow up Call-  Call back number 06/11/2020  Post procedure Call Back phone  # [phone]  Permission to leave phone message Yes  Some recent data might be hidden     Patient questions:  Do you have a fever, pain , or abdominal swelling? No. Pain Score  0 *  Have you tolerated food without any problems? Yes.    Have you been able to return to your normal activities? Yes.    Do you have any questions about your discharge instructions: Diet   No. Medications  No. Follow up visit  No.  Do you have questions or concerns about your Care? No.  Actions: * If pain score is 4 or above: No action needed, pain <4.  1. Have you developed a fever since your procedure? no  2.   Have you had an respiratory symptoms (SOB or cough) since your procedure? no  3.   Have you tested positive for COVID 19 since your procedure no  4.   Have you had any family members/close contacts diagnosed with the COVID 19 since your procedure?  no   If yes to any of these questions please route to Laverna Peace, RN and Charlett Lango, RN

## 2020-06-20 ENCOUNTER — Encounter: Payer: Self-pay | Admitting: Gastroenterology

## 2020-06-21 ENCOUNTER — Telehealth: Payer: Self-pay

## 2020-06-21 ENCOUNTER — Other Ambulatory Visit: Payer: Self-pay

## 2020-06-21 DIAGNOSIS — R7989 Other specified abnormal findings of blood chemistry: Secondary | ICD-10-CM

## 2020-06-21 NOTE — Telephone Encounter (Signed)
-----   Message from Shellia Cleverly, DO sent at 06/20/2020 12:51 PM EDT ----- Labs reviewed. ALT 41 and stable, and other enzymes otherwise normal. Not overly concerning, but would be reasonable to do RUQ [place] (ok to order) and keep f/u appt as scheduled. Would not change appt date, as the ALT has been mildly elevated for some time and overall stable. Thanks.  ----- Message ----- From: Evonnie Pat, RMA Sent: 06/20/2020  11:06 AM EDT To: Shellia Cleverly, DO  Dr Barron Alvine, pts granddaughter is concerned about abn LFTs and wants to know if this needs to be addressed before FU with you on 08/06/20? Thanks Malachi Bonds (she states Gunnar Fusi mentioned something about when she saw pt)

## 2020-06-21 NOTE — Telephone Encounter (Signed)
Spoke with patient regarding LFT per Dr Barron Alvine advise.  RUQ ultrasound has been scheduled for 06/26/20 at 10:30 am at [facility].  Patient verbalized understanding.

## 2020-06-21 NOTE — Telephone Encounter (Signed)
Left message on patients voicemail to return call so that we may schedule [place].

## 2020-06-25 ENCOUNTER — Encounter: Payer: Self-pay | Admitting: Neurology

## 2020-06-25 ENCOUNTER — Other Ambulatory Visit: Payer: Self-pay

## 2020-06-25 ENCOUNTER — Ambulatory Visit: Payer: Medicare Other | Admitting: Neurology

## 2020-06-25 VITALS — BP 133/78 | HR 70 | Ht 60.0 in | Wt 106.8 lb

## 2020-06-25 DIAGNOSIS — G301 Alzheimer's disease with late onset: Secondary | ICD-10-CM

## 2020-06-25 DIAGNOSIS — F0281 Dementia in other diseases classified elsewhere with behavioral disturbance: Secondary | ICD-10-CM | POA: Diagnosis not present

## 2020-06-25 DIAGNOSIS — G20 Parkinson's disease: Secondary | ICD-10-CM

## 2020-06-25 MED ORDER — DONEPEZIL HCL 10 MG PO TABS
ORAL_TABLET | ORAL | 3 refills | Status: DC
Start: 2020-06-25 — End: 2021-01-27

## 2020-06-25 NOTE — Patient Instructions (Addendum)
1. Schedule MRI brain with and without contrast  2. Increase Aricept (Donepezil) to 10mg  daily, take it every morning  3. Discuss olanzapine with your psychiatrist  4. Continue close supervision  5. Follow-up in 6 months, call for any changes  FALL PRECAUTIONS: Be cautious when walking. Scan the area for obstacles that may increase the risk of trips and falls. When getting up in the mornings, sit up at the edge of the bed for a few minutes before getting out of bed. Consider elevating the bed at the head end to avoid drop of blood pressure when getting up. Walk always in a well-lit room (use night lights in the walls). Avoid area rugs or power cords from appliances in the middle of the walkways. Use a walker or a cane if necessary and consider physical therapy for balance exercise. Get your eyesight checked regularly.  FINANCIAL OVERSIGHT: Supervision, especially oversight when making financial decisions or transactions is also recommended.  HOME SAFETY: Consider the safety of the kitchen when operating appliances like stoves, microwave oven, and blender. Consider having supervision and share cooking responsibilities until no longer able to participate in those. Accidents with firearms and other hazards in the house should be identified and addressed as well.  DRIVING: Regarding driving, in patients with progressive memory problems, driving will be impaired. We advise to have someone else do the driving if trouble finding directions or if minor accidents are reported. Independent driving assessment is available to determine safety of driving.  ABILITY TO BE LEFT ALONE: If patient is unable to contact 911 operator, consider using LifeLine, or when the need is there, arrange for someone to stay with patients. Smoking is a fire hazard, consider supervision or cessation. Risk of wandering should be assessed by caregiver and if detected at any point, supervision and safe proof recommendations should be  instituted.  MEDICATION SUPERVISION: Inability to self-administer medication needs to be constantly addressed. Implement a mechanism to ensure safe administration of the medications.  RECOMMENDATIONS FOR ALL PATIENTS WITH MEMORY PROBLEMS: 1. Continue to exercise (Recommend 30 minutes of walking everyday, or 3 hours every week) 2. Increase social interactions - continue going to [place] and enjoy social gatherings with friends and family 3. Eat healthy, avoid fried foods and eat more fruits and vegetables 4. Maintain adequate blood pressure, blood sugar, and blood cholesterol level. Reducing the risk of stroke and cardiovascular disease also helps promoting better memory. 5. Avoid stressful situations. Live a simple life and avoid aggravations. Organize your time and prepare for the next day in anticipation. 6. Sleep well, avoid any interruptions of sleep and avoid any distractions in the bedroom that may interfere with adequate sleep quality 7. Avoid sugar, avoid sweets as there is a strong link between excessive sugar intake, diabetes, and cognitive impairment The Mediterranean diet has been shown to help patients reduce the risk of progressive memory disorders and reduces cardiovascular risk. This includes eating fish, eat fruits and green leafy vegetables, nuts like almonds and hazelnuts, walnuts, and also use olive oil. Avoid fast foods and fried foods as much as possible. Avoid sweets and sugar as sugar use has been linked to worsening of memory function.  There is always a concern of gradual progression of memory problems. If this is the case, then we may need to adjust level of care according to patient needs. Support, both to the patient and caregiver, should then be put into place.

## 2020-06-25 NOTE — Progress Notes (Signed)
NEUROLOGY CONSULTATION NOTE  Brandi Walsh MRN: [id number] DOB: July 16, 1946  Referring provider: Dr. Orie Walsh Primary care provider: Ria Clock, Brandi Walsh  Reason for consult:  dementia  Dear Brandi Walsh:  Thank you for your kind referral of Brandi Walsh Brandi Walsh for consultation of the above symptoms. Although her history is well known to you, please allow me to reiterate it for the purpose of our medical record. The patient was accompanied to the clinic by her granddaughter Brandi Walsh who also provides collateral information. Records and images were personally reviewed where available.   HISTORY OF PRESENT ILLNESS: This is Walsh 74 year old left-handed woman with Walsh history of hypertension, hyperlipidemia, depression, anxiety, presenting for evaluation of recently diagnosed dementia on Neuropsychological testing done last 01/2020. Records reviewed. She reports her memory may not be so great. Her granddaughter Brandi Walsh provides additional information. Brandi Walsh reports an incident in December 2018 when she had visual and auditory hallucinations and was admitted to [facility] and started on psychiatric medication. She had Neuropsychological testing in April 2018 with Brandi. 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 Cognitive testing indicated possible very mild global attenuation of cognitive function relative to estimated premorbid intellectual abilities, with the only area of true impairment being phonemic verbal fluency and memory retrieval afor newly learned auditory information, probable mild frontal-subcortical dysfunction. Thought process was still mildly disorganized, unclear if longstanding and/or related to primary psychiatric disorder. She was no longer having delusions at that time. Brandi Walsh reports that since then, there has been Walsh steady decline with her memory. Psychiatric disorder had been stable  for 2 years with adjustment in Brandi Walsh and Brandi Walsh. Over the past 6 months, Brandi Walsh noticed Walsh biggest turning point in her cognition. She also noticed slower movements. She is overall more anxious. Her psychiatrist has advised monitoring for tardive dyskinesia, her lower jaw movements have progressively worsened. Brandi Walsh was not sure about medication adherence, so she has been helping the last few weeks. She noted waxing and waning symptoms and requested repeat Neuropsychological testing. Records from Brandi. 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 Cholinesterase inhibitor was recommended, she was started on Brandi Walsh 5mg  daily by her Psychiatrist, no side effects.   She lives with her husband. She states her memory "may not be so great." reports that she has always been very sharp, so the slightest things she does not remember would bother her. She denies getting lost driving but drives rarely, around once Walsh month. She and her husband do bills together, there may be some missed bills. Her husband manages meals. She is slow to answer questions but answers appropriately and thoughtfully. She has some dizziness in the morning and endorses vertigo when lying flat. She has some blurred vision. She has had some pain between her shoulder pain for Walsh few weeks. She reports numbness in both calves. She has had bilateral leg swelling even before Brandi Walsh was started. She has occasional constipation. She has occasional hand tremors when anxious. No headaches, dysarthria/dysphagia, focal weakness, anosmia. Sleep is okay. She used to work for customer service with Brandi Walsh. She reports mood is good. Brandi Walsh notes she worries/stresses too  much. Everyday is Walsh little different with how she feels, how quickly she is  moving. Sometimes she is Walsh bit more agitated and does not want to eat, saying her partials don't fit well or her things get stuck (she is s/p esophageal dilatation). Brandi Walsh notes she has always been thoughtful but not as slow. She has always been very precise and detail-oriented, which is not changed, but she is not usually as slow. No visual/auditory hallucinations. She is independent with dressing and bathing but gets stuck sometimes putting on clothes. If she gets stuck in Walsh thought, she freezes and cannot move. No recent changes in psychiatric medication, Walsh year ago Brandi Walsh was increased to 10mg  to increase motivation, which has not helped much. Brandi Walsh notes there was more lucidity to her surroundings at that time, which is not present now. No family history of dementia, no history of concussions or alcohol use.   PAST MEDICAL HISTORY: Past Medical History:  Diagnosis Date  . COUGH DUE TO ACE INHIBITORS 11/25/2009   Qualifier: Diagnosis of  By: Brandi AllEllison MD, Brandi Walsh   . Depression   . DIABETES MELLITUS, TYPE II 10/13/2007   Qualifier: Diagnosis of  By: Brandi Walsh    . HYPERLIPIDEMIA 10/13/2007   Qualifier: Diagnosis of  By: Brandi Walsh    . HYPERTENSION 10/13/2007   Qualifier: Diagnosis of  By: Brandi Walsh    . MENOPAUSAL SYNDROME 10/13/2007   Qualifier: Diagnosis of  By: Brandi Walsh    . OSTEOPOROSIS 10/13/2007   Qualifier: Diagnosis of  By: Brandi Walsh      PAST SURGICAL HISTORY: Past Surgical History:  Procedure Laterality Date  . ABDOMINAL HYSTERECTOMY    . BREAST BIOPSY  08/2002  . TONSILLECTOMY      MEDICATIONS: Current Outpatient Medications on File Prior to Visit  Medication Sig Dispense Refill  . Cholecalciferol (VITAMIN D) 50 MCG (2000 UT) tablet Take 2,000 Units by mouth daily.    Marland Kitchen. Brandi Walsh (ARICEPT) 5 MG tablet Take 1 tablet (5 mg total) by mouth at bedtime. 30 tablet 2  .  escitalopram (Brandi Walsh) 10 MG tablet Take 1 tablet (10 mg total) by mouth daily. 90 tablet 0  . losartan (COZAAR) 100 MG tablet Take 1 tablet by mouth once daily 90 tablet 0  . Brandi Walsh (ZYPREXA) 2.5 MG tablet Take 1 tablet (2.5 mg total) by mouth at bedtime. 90 tablet 0  . omeprazole (PRILOSEC) 40 MG capsule Take 1 capsule (40 mg total) by mouth daily. 30 capsule 3  . rosuvastatin (CRESTOR) 40 MG tablet Take 1 tablet (40 mg total) by mouth daily. 90 tablet 1   No current facility-administered medications on file prior to visit.    ALLERGIES: Allergies  Allergen Reactions  . Lovastatin     REACTION: Rash    FAMILY HISTORY: Family History  Problem Relation Age of Onset  . Anxiety disorder Mother   . Hyperparathyroidism Sister   . Cancer Neg Hx   . Breast cancer Neg Hx     SOCIAL HISTORY: Social History   Socioeconomic History  . Marital status: Married    Spouse name: Not on file  . Number of children: Not on file  . Years of education: Not on file  . Highest education level: Not on file  Occupational History  . Occupation: Retired   Tobacco Use  . Smoking status: Never Smoker  . Smokeless tobacco: Never Used  Vaping Use  . Vaping Use: Never used  Substance and Sexual Activity  . Alcohol use: No  . Drug use: No  .  Sexual activity: Yes  Other Topics Concern  . Not on file  Social History Narrative   Left Handed   Two Story Home   Lives with Husband    Drinks coffee and [employer] sometimes   Social Determinants of Health   Financial Resource Strain:   . Difficulty of Paying Living Expenses:   Food Insecurity:   . Worried About Programme researcher, broadcasting/film/video in the Last Year:   . Barista in the Last Year:   Transportation Needs:   . Freight forwarder (Medical):   Marland Kitchen Lack of Transportation (Non-Medical):   Physical Activity:   . Days of Exercise per Week:   . Minutes of Exercise per Session:   Stress:   . Feeling of Stress :   Social Connections:   .  Frequency of Communication with Friends and Family:   . Frequency of Social Gatherings with Friends and Family:   . Attends Religious Services:   . Active Member of Clubs or Organizations:   . Attends Banker Meetings:   Marland Kitchen Marital Status:   Intimate Partner Violence:   . Fear of Current or Ex-Partner:   . Emotionally Abused:   Marland Kitchen Physically Abused:   . Sexually Abused:     PHYSICAL EXAM: Vitals:   06/25/20 0910  BP: (!) 133/78  Pulse: 70  SpO2: 98%   General: No acute distress, tardive dyskinesia with lower jaw movements Head:  Normocephalic/atraumatic Skin/Extremities: No rash, no edema Neurological Exam: Mental status: alert and oriented to person, place, and time, no dysarthria or aphasia, Fund of knowledge is appropriate.  Recent and remote memory are impaired. Attention and concentration are reduced. SLUMS score 13/20 [employer] Mental Exam 06/25/2020  Weekday Correct 1  Current year 1  What state are we in? 1  Amount spent 1  Amount left 0  # of Animals 1  5 objects recall 0  Number series 2  Hour markers 2  Time correct 0  Placed X in triangle correctly 1  Largest Figure 1  Name of female 2  Date back to work 0  Type of work 0  State she lived in 0  Total score 13    Cranial nerves: CN I: not tested CN II: pupils equal, round and reactive to light, visual fields intact CN III, IV, VI:  full range of motion, no nystagmus, no ptosis CN V: facial sensation intact CN VII: upper and lower face symmetric CN VIII: hearing intact to conversation Bulk & Tone: increased tone with bilateral cogwheeling, no fasciculations. Motor: 5/5 throughout with no pronator drift. Sensation: intact to light touch, cold, pin, vibration and joint position sense.  No extinction to double simultaneous stimulation.  Romberg test negative Deep Tendon Reflexes: +2 throughout Cerebellar: no incoordination on finger to nose testing Gait: narrow-based and steady,  slightly decreased arm swing on right Tremor: no tremor today +bradykinesia with decreased foot taps, fair finger taps, +postural instability   IMPRESSION: This is Walsh 74 year old left-handed woman with Walsh history of hypertension, hyperlipidemia, depression, anxiety, presenting for evaluation of recently diagnosed dementia on Neuropsychological testing done last 01/2020, probable Alzheimer's disease. Her granddaughter has noticed cognitive decline, but has noticed more bradykinesia. 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 Increase Brandi Walsh to 10mg  daily. Interval MRI brain with and without contrast will be ordered as recommended on  recent Neuropsych testing. Continue close supervision. Follow-up in 6 months, they know to call for any changes.   Thank you for allowing me to participate in the care of this patient. Please do not hesitate to call for any questions or concerns.   Patrcia Dolly, M.D.  CC: Brandi Clock, Brandi Walsh

## 2020-06-26 ENCOUNTER — Other Ambulatory Visit: Payer: Self-pay

## 2020-06-26 ENCOUNTER — Ambulatory Visit (HOSPITAL_COMMUNITY)
Admission: RE | Admit: 2020-06-26 | Discharge: 2020-06-26 | Disposition: A | Discharge status: Home / Self Care | Payer: Medicare Other | Source: Ambulatory Visit | Attending: Gastroenterology | Admitting: Gastroenterology

## 2020-06-26 ENCOUNTER — Ambulatory Visit (INDEPENDENT_AMBULATORY_CARE_PROVIDER_SITE_OTHER): Payer: Medicare Other | Admitting: Family

## 2020-06-26 ENCOUNTER — Ambulatory Visit (INDEPENDENT_AMBULATORY_CARE_PROVIDER_SITE_OTHER): Payer: Medicare Other

## 2020-06-26 ENCOUNTER — Encounter: Payer: Self-pay | Admitting: Family

## 2020-06-26 VITALS — BP 118/68 | HR 57 | Temp 98.6°F | Wt 105.8 lb

## 2020-06-26 DIAGNOSIS — R7989 Other specified abnormal findings of blood chemistry: Secondary | ICD-10-CM | POA: Diagnosis not present

## 2020-06-26 DIAGNOSIS — R600 Localized edema: Secondary | ICD-10-CM

## 2020-06-26 MED ORDER — FUROSEMIDE 20 MG PO TABS
20.0000 mg | ORAL_TABLET | Freq: Every day | ORAL | 0 refills | Status: DC | PRN
Start: 2020-06-26 — End: 2020-07-23

## 2020-06-26 NOTE — Progress Notes (Signed)
Brandi Walsh is a 74 y.o. female with the following history as recorded in Epic[facility]:  Patient Active Problem List   Diagnosis Date Noted  . 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  . Abnormal liver enzymes 09/19/2019  . Blurred vision 09/19/2019  . Hypercalcemia 03/15/2019  . Major depression in full remission (HCC) 11/24/2018  . Memory loss 12/24/2016  . Encounter for well adult exam with abnormal findings 06/13/2013  . Hyperparathyroidism, primary (HCC) 06/12/2013  . GERD (gastroesophageal reflux disease) 02/19/2012  . 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  . COUGH DUE TO ACE INHIBITORS 11/25/2009  . Diabetes (HCC) 10/13/2007  . Dyslipidemia 10/13/2007  . Essential hypertension 10/13/2007  . MENOPAUSAL SYNDROME 10/13/2007  . Osteoporosis 10/13/2007    Current Outpatient Medications  Medication Sig Dispense Refill  . Cholecalciferol (VITAMIN D) 50 MCG (2000 UT) tablet Take 2,000 Units by mouth daily.    Marland Kitchen donepezil (ARICEPT) 10 MG tablet Take 1 tablet daily 90 tablet 3  . escitalopram (LEXAPRO) 10 MG tablet Take 1 tablet (10 mg total) by mouth daily. 90 tablet 0  . losartan (COZAAR) 100 MG tablet Take 1 tablet by mouth once daily 90 tablet 0  . OLANZapine (ZYPREXA) 2.5 MG tablet Take 1 tablet (2.5 mg total) by mouth at bedtime. 90 tablet 0  . omeprazole (PRILOSEC) 40 MG capsule Take 1 capsule (40 mg total) by mouth daily. 30 capsule 3  . rosuvastatin (CRESTOR) 40 MG tablet Take 1 tablet (40 mg total) by mouth daily. 90 tablet 1  . furosemide (LASIX) 20 MG tablet Take 1 tablet (20 mg total) by mouth daily as needed for fluid or edema. 30 tablet 0   No current facility-administered medications for this visit.    Allergies: Lovastatin  Past Medical History:  Diagnosis Date  . COUGH DUE TO ACE INHIBITORS 11/25/2009   Qualifier: Diagnosis of  By: Everardo All MD, Cleophas Dunker   . Depression   . DIABETES MELLITUS, TYPE II 10/13/2007   Qualifier: Diagnosis of  By: Charlsie Quest RMA, Lucy    . HYPERLIPIDEMIA 10/13/2007    Qualifier: Diagnosis of  By: Tyrone Apple, Lucy    . HYPERTENSION 10/13/2007   Qualifier: Diagnosis of  By: Tyrone Apple, Lucy    . MENOPAUSAL SYNDROME 10/13/2007   Qualifier: Diagnosis of  By: Charlsie Quest RMA, Lucy    . OSTEOPOROSIS 10/13/2007   Qualifier: Diagnosis of  By: Samara Snide      Past Surgical History:  Procedure Laterality Date  . ABDOMINAL HYSTERECTOMY    . BREAST BIOPSY  08/2002  . TONSILLECTOMY      Family History  Problem Relation Age of Onset  . Anxiety disorder Mother   . Hyperparathyroidism Sister   . Cancer Neg Hx   . Breast cancer Neg Hx     Social History   Tobacco Use  . Smoking status: Never Smoker  . Smokeless tobacco: Never Used  Substance Use Topics  . Alcohol use: No    Subjective:  Accompanied by husband; granddaughter is on speaker phone; Concerned about recurrent swelling in ankles- symptoms on and off for months; patient is adamant that the swelling is due to diabetic neuropathy; has continued to lose weight- had abdominal ultrasound this am/ concern for worsening dementia based on neurology note from yesterday;    Objective:  Vitals:   06/26/20 1156  BP: 118/68  Pulse: 57  Temp: 98.6 F (37 C)  TempSrc: Oral  SpO2: 99%  Weight: 105 lb 12.8 oz (48 kg)    General: Well developed,  well nourished, in no acute distress  Skin : Warm and dry.  Head: Normocephalic and atraumatic  Lungs: Respirations unlabored; clear to auscultation bilaterally without wheeze, rales, rhonchi  CVS exam: normal rate and regular rhythm.  Extremities: bilateral pedal edema, cyanosis, clubbing  Vessels: Symmetric bilaterally  Neurologic: Alert and oriented; speech intact; face symmetrical; moves all extremities well; CNII-XII intact without focal deficit   Assessment:  1. Pedal edema     Plan:  ? Etiology; update CXR and labs today; had abdominal imaging today; trial of Lasix 20 mg prn for swelling; follow-up to be determined;  This visit occurred during the  SARS-CoV-2 public health emergency.  Safety protocols were in place, including screening questions prior to the visit, additional usage of staff PPE, and extensive cleaning of exam room while observing appropriate contact time as indicated for disinfecting solutions.     No follow-ups on file.  Orders Placed This Encounter  Procedures  . DG Chest 2 View    Standing Status:   Future    Number of Occurrences:   1    Standing Expiration Date:   06/26/2021    Order Specific Question:   Reason for Exam (SYMPTOM  OR DIAGNOSIS REQUIRED)    Answer:   pedal edema    Order Specific Question:   Preferred imaging location?    Answer:   [facility]    Order Specific Question:   Radiology Contrast Protocol - do NOT remove file path    Answer:   [email or web address]  . B Nat Peptide    Standing Status:   Future    Number of Occurrences:   1    Standing Expiration Date:   06/26/2021  . CBC with Differential/Platelet    Standing Status:   Future    Number of Occurrences:   1    Standing Expiration Date:   06/26/2021    Requested Prescriptions   Signed Prescriptions Disp Refills  . furosemide (LASIX) 20 MG tablet 30 tablet 0    Sig: Take 1 tablet (20 mg total) by mouth daily as needed for fluid or edema.

## 2020-06-27 LAB — BRAIN NATRIURETIC PEPTIDE: Brain Natriuretic Peptide: 66 pg/mL (ref <100)

## 2020-06-27 LAB — CBC WITH DIFFERENTIAL/PLATELET
Absolute Monocytes: 510 cells/uL (ref 200–950)
Basophils Absolute: 38 cells/uL (ref 0–200)
Basophils Relative: 0.5 %
Eosinophils Absolute: 30 cells/uL (ref 15–500)
Eosinophils Relative: 0.4 %
HCT: 43.9 % (ref 35.0–45.0)
Hemoglobin: 14.9 g/dL (ref 11.7–15.5)
Lymphs Abs: 1680 cells/uL (ref 850–3900)
MCH: 32.1 pg (ref 27.0–33.0)
MCHC: 33.9 g/dL (ref 32.0–36.0)
MCV: 94.6 fL (ref 80.0–100.0)
MPV: 11.1 fL (ref 7.5–12.5)
Monocytes Relative: 6.8 %
Neutro Abs: 5243 cells/uL (ref 1500–7800)
Neutrophils Relative %: 69.9 %
Platelets: 238 10*3/uL (ref 140–400)
RBC: 4.64 10*6/uL (ref 3.80–5.10)
RDW: 12.9 % (ref 11.0–15.0)
Total Lymphocyte: 22.4 %
WBC: 7.5 10*3/uL (ref 3.8–10.8)

## 2020-07-02 ENCOUNTER — Telehealth: Payer: Self-pay | Admitting: Psychiatry

## 2020-07-02 NOTE — Telephone Encounter (Signed)
Brandi Walsh called wanting to discuss medication based on a recent appointment with [facility] neurologist.  The neurologist feels the Olanzapine is causing neurological issues. Would like to make a sooner appointment but wants to discuss the neurologist report so she'll know what to do about making a sooner appt.  Please call Brandi Walsh at [phone]

## 2020-07-03 ENCOUNTER — Emergency Department (HOSPITAL_COMMUNITY)
Admission: EM | Admit: 2020-07-03 | Discharge: 2020-07-03 | Disposition: A | Discharge status: Home / Self Care | Payer: Medicare Other | Attending: Emergency Medicine | Admitting: Emergency Medicine

## 2020-07-03 ENCOUNTER — Telehealth: Payer: Self-pay | Admitting: Surgery

## 2020-07-03 ENCOUNTER — Other Ambulatory Visit: Payer: Self-pay

## 2020-07-03 ENCOUNTER — Emergency Department (HOSPITAL_COMMUNITY): Payer: Medicare Other

## 2020-07-03 DIAGNOSIS — E119 Type 2 diabetes mellitus without complications: Secondary | ICD-10-CM | POA: Diagnosis not present

## 2020-07-03 DIAGNOSIS — I10 Essential (primary) hypertension: Secondary | ICD-10-CM | POA: Diagnosis not present

## 2020-07-03 DIAGNOSIS — F0390 Unspecified dementia without behavioral disturbance: Secondary | ICD-10-CM

## 2020-07-03 DIAGNOSIS — Z79899 Other long term (current) drug therapy: Secondary | ICD-10-CM | POA: Insufficient documentation

## 2020-07-03 LAB — COMPREHENSIVE METABOLIC PANEL
ALT: 29 U/L (ref 0–44)
AST: 22 U/L (ref 15–41)
Albumin: 3.6 g/dL (ref 3.5–5.0)
Alkaline Phosphatase: 70 U/L (ref 38–126)
Anion gap: 5 (ref 5–15)
BUN: 13 mg/dL (ref 8–23)
CO2: 28 mmol/L (ref 22–32)
Calcium: 10.3 mg/dL (ref 8.9–10.3)
Chloride: 106 mmol/L (ref 98–111)
Creatinine, Ser: 0.88 mg/dL (ref 0.44–1.00)
GFR calc Af Amer: >60 mL/min (ref >60)
GFR calc non Af Amer: >60 mL/min (ref >60)
Glucose, Bld: 114 mg/dL — ABNORMAL HIGH (ref 70–99)
Potassium: 3.7 mmol/L (ref 3.5–5.1)
Sodium: 139 mmol/L (ref 135–145)
Total Bilirubin: 1.4 mg/dL — ABNORMAL HIGH (ref 0.3–1.2)
Total Protein: 6 g/dL — ABNORMAL LOW (ref 6.5–8.1)

## 2020-07-03 LAB — CBC WITH DIFFERENTIAL/PLATELET
Abs Immature Granulocytes: 0.03 10*3/uL (ref 0.00–0.07)
Basophils Absolute: 0 10*3/uL (ref 0.0–0.1)
Basophils Relative: 0 %
Eosinophils Absolute: 0 10*3/uL (ref 0.0–0.5)
Eosinophils Relative: 1 %
HCT: 41.1 % (ref 36.0–46.0)
Hemoglobin: 13.8 g/dL (ref 12.0–15.0)
Immature Granulocytes: 0 %
Lymphocytes Relative: 19 %
Lymphs Abs: 1.6 10*3/uL (ref 0.7–4.0)
MCH: 31.6 pg (ref 26.0–34.0)
MCHC: 33.6 g/dL (ref 30.0–36.0)
MCV: 94.1 fL (ref 80.0–100.0)
Monocytes Absolute: 0.7 10*3/uL (ref 0.1–1.0)
Monocytes Relative: 8 %
Neutro Abs: 6 10*3/uL (ref 1.7–7.7)
Neutrophils Relative %: 72 %
Platelets: 195 10*3/uL (ref 150–400)
RBC: 4.37 MIL/uL (ref 3.87–5.11)
RDW: 13.4 % (ref 11.5–15.5)
WBC: 8.4 10*3/uL (ref 4.0–10.5)
nRBC: 0 % (ref 0.0–0.2)

## 2020-07-03 LAB — URINALYSIS, ROUTINE W REFLEX MICROSCOPIC
Bilirubin Urine: NEGATIVE
Glucose, UA: NEGATIVE mg/dL
Hgb urine dipstick: NEGATIVE
Ketones, ur: NEGATIVE mg/dL
Leukocytes,Ua: NEGATIVE
Nitrite: NEGATIVE
Protein, ur: NEGATIVE mg/dL
Specific Gravity, Urine: 1.014 (ref 1.005–1.030)
pH: 6 (ref 5.0–8.0)

## 2020-07-03 LAB — CBG MONITORING, ED: Glucose-Capillary: 105 mg/dL — ABNORMAL HIGH (ref 70–99)

## 2020-07-03 NOTE — Discharge Instructions (Addendum)
As we discussed, your work-up today was reassuring.  I do not see any signs of infection in your blood work or in your urine.  Your CT scan looked good.  As we discussed, this may be worsening with the dementia.  You need to follow-up with your neurologist and with your primary care doctor.  I have consulted our social care to discuss with you about home health needs to help.  Return to the emergency department for any confusion, fever, vomiting or any other worsening concerning symptoms.

## 2020-07-03 NOTE — Telephone Encounter (Signed)
Patient has [facility] Medicare, HH options are limited. ED CM will send referral to several [facility] agencies [facility] and [facility] patient and spouse was made aware.  ED CM will follow up with them  Tomorrow.

## 2020-07-03 NOTE — Care Management (Signed)
After speaking further with EDP patient and  Family declined the service so HH orders were canceled.

## 2020-07-03 NOTE — ED Triage Notes (Signed)
Per [facility]: Hx dementia, husband concerned over 2 days with increased confusion and hallucinations. Pt didn't want to go anywhere because "she didn't have clothes on" when in fact she was wearing clothes. Pt on lasix, has had increased swelling in lower extremities. Pt also on potassium. Pt is answering most questions appropriately. Alert and oriented X 3.    Aurora Mask - husband

## 2020-07-03 NOTE — ED Notes (Signed)
Discharge instructions reviewed with pt. Pt verbalized understanding.   

## 2020-07-03 NOTE — ED Provider Notes (Signed)
[facility] EMERGENCY DEPARTMENT Provider Note   CSN: [id number] Arrival date & time: 07/03/20  1559     History Chief Complaint  Patient presents with  . Dementia    Brandi Walsh is a 74 y.o. female past medical history of dementia, hypertension, hyperlipidemia brought in by EMS for evaluation of episode of confusion.  Husband provides most the history.  He reports that patient has not documented history of dementia.  He states that at baseline, she will get confused sometimes.  He does report of a gradual decline in mental function and baseline over the last 4 years since her initial diagnosis.  He feels like over the last week and a half, it has been slightly worse.  He states she has been more agitated.  He also reports she has not really been wanting to eat much and has not really been taking her medications as she was supposed to.  He states that this culminated in an episode today where she was confused and he could not redirect her.  He states that patient was getting upset because she had to go "to a meeting tomorrow and could not find close to where."  He tried to de-escalate the situation and distract her but he states that she just kept walking around and could not be redirected, prompting EMS call.  She has not been sick in the last few days.  He denies any fevers, vomiting, complaints of abdominal pain or chest pain.  The history is provided by the patient.       Past Medical History:  Diagnosis Date  . COUGH DUE TO ACE INHIBITORS 11/25/2009   Qualifier: Diagnosis of  By: Everardo All MD, Cleophas Dunker   . Depression   . DIABETES MELLITUS, TYPE II 10/13/2007   Qualifier: Diagnosis of  By: Charlsie Quest RMA, Lucy    . HYPERLIPIDEMIA 10/13/2007   Qualifier: Diagnosis of  By: Tyrone Apple, Lucy    . HYPERTENSION 10/13/2007   Qualifier: Diagnosis of  By: Tyrone Apple, Lucy    . MENOPAUSAL SYNDROME 10/13/2007   Qualifier: Diagnosis of  By: Charlsie Quest RMA, Lucy    . OSTEOPOROSIS  10/13/2007   Qualifier: Diagnosis of  By: Samara Snide      Patient Active Problem List   Diagnosis Date Noted  . 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  . Abnormal liver enzymes 09/19/2019  . Blurred vision 09/19/2019  . Hypercalcemia 03/15/2019  . Major depression in full remission (HCC) 11/24/2018  . Memory loss 12/24/2016  . Encounter for well adult exam with abnormal findings 06/13/2013  . Hyperparathyroidism, primary (HCC) 06/12/2013  . GERD (gastroesophageal reflux disease) 02/19/2012  . 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  . COUGH DUE TO ACE INHIBITORS 11/25/2009  . Diabetes (HCC) 10/13/2007  . Dyslipidemia 10/13/2007  . Essential hypertension 10/13/2007  . MENOPAUSAL SYNDROME 10/13/2007  . Osteoporosis 10/13/2007    Past Surgical History:  Procedure Laterality Date  . ABDOMINAL HYSTERECTOMY    . BREAST BIOPSY  08/2002  . TONSILLECTOMY       OB History   No obstetric history on file.     Family History  Problem Relation Age of Onset  . Anxiety disorder Mother   . Hyperparathyroidism Sister   . Cancer Neg Hx   . Breast cancer Neg Hx     Social History   Tobacco Use  . Smoking status: Never Smoker  . Smokeless tobacco: Never Used  Vaping Use  . Vaping Use: Never used  Substance Use Topics  .  Alcohol use: No  . Drug use: No    Home Medications Prior to Admission medications   Medication Sig Start Date End Date Taking? Authorizing Provider  Cholecalciferol (VITAMIN D) 50 MCG (2000 UT) tablet Take 2,000 Units by mouth daily.   Yes [provider]  donepezil (ARICEPT) 10 MG tablet Take 1 tablet daily 06/25/20  Yes Van Clines, MD  escitalopram (LEXAPRO) 10 MG tablet Take 1 tablet (10 mg total) by mouth daily. 05/06/20 08/04/20 Yes [name], PMHNP  furosemide (LASIX) 20 MG tablet Take 1 tablet (20 mg total) by mouth daily as needed for fluid or edema. 06/26/20  Yes [name], FNP  losartan (COZAAR) 100 MG tablet Take 1 tablet by mouth once daily 04/30/20   Yes [name], FNP  omeprazole (PRILOSEC) 40 MG capsule Take 1 capsule (40 mg total) by mouth daily. 04/22/20  Yes [name], FNP  rosuvastatin (CRESTOR) 40 MG tablet Take 1 tablet (40 mg total) by mouth daily. 02/21/20  Yes [name], FNP    Allergies    Lovastatin  Review of Systems   Review of Systems  Unable to perform ROS: Dementia    Physical Exam Updated Vital Signs BP (!) 125/58   Pulse (!) 54   Temp 98.1 F (36.7 C) (Oral)   Resp 14   SpO2 100%   Physical Exam Vitals and nursing note reviewed.  Constitutional:      Appearance: Normal appearance. She is well-developed.  HENT:     Head: Normocephalic and atraumatic.  Eyes:     General: Lids are normal.     Conjunctiva/sclera: Conjunctivae normal.     Pupils: Pupils are equal, round, and reactive to light.     Comments: PERRL. EOMs intact. No nystagmus. No neglect.   Cardiovascular:     Rate and Rhythm: Normal rate and regular rhythm.     Pulses: Normal pulses.     Heart sounds: Normal heart sounds. No murmur heard.  No friction rub. No gallop.   Pulmonary:     Effort: Pulmonary effort is normal.     Breath sounds: Normal breath sounds.     Comments: Lungs clear to auscultation bilaterally.  Symmetric chest rise.  No wheezing, rales, rhonchi. Abdominal:     Palpations: Abdomen is soft. Abdomen is not rigid.     Tenderness: There is no abdominal tenderness. There is no guarding.     Comments: Abdomen is soft, non-distended, non-tender. No rigidity, No guarding. No peritoneal signs.  Musculoskeletal:        General: Normal range of motion.     Cervical back: Full passive range of motion without pain.  Skin:    General: Skin is warm and dry.     Capillary Refill: Capillary refill takes less than 2 seconds.  Neurological:     Mental Status: She is alert and oriented to person, place, and time.     Comments: Cranial nerves III-XII intact Follows commands, Moves all  extremities  5/5 strength to BUE and BLE  Sensation intact throughout all major nerve distributions No slurred speech. No facial droop.  Alert and oriented x 3. Able to answer questions appropriately   Psychiatric:        Speech: Speech normal.     ED Results / Procedures / Treatments   Labs (all labs ordered are listed, but only abnormal results are displayed) Labs Reviewed  COMPREHENSIVE METABOLIC PANEL - Abnormal; Notable for the following components:  Result Value   Glucose, Bld 114 (*)    Total Protein 6.0 (*)    Total Bilirubin 1.4 (*)    All other components within normal limits  URINALYSIS, ROUTINE W REFLEX MICROSCOPIC - Abnormal; Notable for the following components:   APPearance CLOUDY (*)    All other components within normal limits  CBG MONITORING, ED - Abnormal; Notable for the following components:   Glucose-Capillary 105 (*)    All other components within normal limits  CBC WITH DIFFERENTIAL/PLATELET    EKG None  Radiology CT Head Wo Contrast  Result Date: 07/03/2020 CLINICAL DATA:  Delirium and dementia. EXAM: CT HEAD WITHOUT CONTRAST TECHNIQUE: Contiguous axial images were obtained from the base of the skull through the vertex without intravenous contrast. COMPARISON:  None. FINDINGS: Brain: The brain does not show accelerated atrophy. No old or acute focal infarction, mass lesion, hemorrhage, hydrocephalus or extra-axial collection. Vascular: No abnormal vascular finding. Skull: Negative Sinuses/Orbits: Clear/normal Other: None IMPRESSION: Normal study for age.  No abnormality seen to explain delirium. Electronically Signed   By: Paulina Fusi M.D.   On: 07/03/2020 17:47    Procedures Procedures (including critical care time)  Medications Ordered in ED Medications - No data to display  ED Course  I have reviewed the triage vital signs and the nursing notes.  Pertinent labs & imaging results that were available during my care of the patient were  reviewed by me and considered in my medical decision making (see chart for details).    MDM Rules/Calculators/A&P                          74 year old female past history of dementia brought in by EMS for evaluation of confusion.  Husband reports progressive decline over the last few years.  He reports today, she had an episode of confusion where he could not redirect her, prompting EMS call.  He also reports she has not been eating and drinking as much and does not know if she has been taking her medications.  No fevers, vomiting.  On initial arrival, she is afebrile nontoxic-appearing.  Vital signs are stable.  She is able to answer my questions.  No evidence of respiratory stress.  No obvious neuro deficits noted on exam.  We will plan to check labs, urine, head CT for evaluation.  This may just be worsening dementia and progression of her disease.  But will plan to rule out any acute infectious or electrolyte abnormality that could contribute to acute delirium.  I discussed at length with patient's husband, Brandi Walsh.  He states that he has noted gradual decline since patient's initial dementia diagnosis.  He does state that over the last several weeks, she has had difficulty eating.  He states that she had some issues with esophageal stricture and is worried that she is going to choke.  Additionally, she had recently had some teeth removed and had some dentures that she did not feel the fit appropriately.  He reports that since then, she has been reluctant to eat.  He states she has not had any vomiting.  CBC shows no leukocytosis or anemia. CMP shows normal BUN/Cr. No electrolyte abnormalities. CT head negative for any acute abnormality.  Urine with no acute abnormalities.  Patient hemodynamically stable.  At this time, no infectious etiology causing her confusion.  I suspect this may be related to ongoing dementia.  At this time, she is alert and oriented x3 and  is able to answer all questions  appropriately. She does not appear altered and is able to converse without any difficulty.  She is not agitated or confused.  Social work has been consulted for helping with eating, and health needs as needed but husband declined their services. I discussed with husband that she will need to follow-up with her primary care doctor and with her neurologist. At this time, patient exhibits no emergent life-threatening condition that require further evaluation in ED or admission. Discussed patient with Dr. Lenna Gilfordysktra who is agreeable. Patient had ample opportunity for questions and discussion. All patient's questions were answered with full understanding. Strict return precautions discussed. Patient expresses understanding and agreement to plan.   Portions of this note were generated with Scientist, clinical (histocompatibility and immunogenetics)Dragon dictation software. Dictation errors may occur despite best attempts at proofreading.  Final Clinical Impression(s) / ED Diagnoses Final diagnoses:  Dementia without behavioral disturbance, unspecified dementia type [facility](HCC)    Rx / DC Orders ED Discharge Orders    None       Rosana HoesLayden, Nadeem Romanoski A, PA-C 07/03/20 2308    Milagros Lollykstra, Richard S, MD 07/04/20 0004

## 2020-07-03 NOTE — ED Notes (Signed)
Patient transported to CT 

## 2020-07-03 NOTE — Social Work (Signed)
CSW met with Pt and husband at bedside. Pt and husband agreed that [facility] would be a good option for their care needs.  Family reports that they have a PCP, psychiatrist and other health care professionals that they, "see every 30 days at least."  [facility] team will follow up with family once [facility] is in place.  CSW explained HH process to family. Husband will be the primary point of contact. Family expressed understanding of process and thanks.

## 2020-07-03 NOTE — Telephone Encounter (Signed)
Returned call to granddaughter, [name]. She reports that she has been increasingly involved in pt's care recently. [name] reports that Dr. Karel Jarvis noted TD. [name] reports that family is noting that pt seems to move slower on the days she has taken Olanzapine. Granddaughter reports that TD has worsened over the last 6 months. Reports that pt has been taking Olanzapine more consistently. Discussed that Olanzapine could also potentially cause LE edema. Will stop Olanzapine until next f/u apt to determine if pt may be experiencing side effects and determine risks and benefits of continuing Olanzapine. Percell Miller to call if they notice any insomnia, psychosis, or other worsening s/s.

## 2020-07-08 ENCOUNTER — Telehealth: Payer: Self-pay

## 2020-07-08 ENCOUNTER — Other Ambulatory Visit: Payer: Self-pay | Admitting: Family

## 2020-07-08 ENCOUNTER — Ambulatory Visit: Payer: Medicare Other | Admitting: Neurology

## 2020-07-08 DIAGNOSIS — F0391 Unspecified dementia with behavioral disturbance: Secondary | ICD-10-CM

## 2020-07-08 NOTE — Telephone Encounter (Signed)
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

## 2020-07-15 ENCOUNTER — Other Ambulatory Visit: Payer: Self-pay | Admitting: Neurology

## 2020-07-15 MED ORDER — DIAZEPAM 5 MG PO TABS
ORAL_TABLET | ORAL | 0 refills | Status: DC
Start: 2020-07-15 — End: 2020-08-06

## 2020-07-16 ENCOUNTER — Telehealth: Payer: Self-pay | Admitting: Family

## 2020-07-16 NOTE — Telephone Encounter (Signed)
   Brandi Walsh from [facility] calling to request nursing orders 2x week for 2 1x week for 1  Phone [phone]

## 2020-07-17 ENCOUNTER — Telehealth: Payer: Self-pay

## 2020-07-17 NOTE — Telephone Encounter (Signed)
Okay to give orders as requested;  

## 2020-07-17 NOTE — Telephone Encounter (Signed)
Notified Kelly w/Laura response.Marland KitchenRaechel Chute

## 2020-07-17 NOTE — Telephone Encounter (Deleted)
error 

## 2020-07-22 ENCOUNTER — Ambulatory Visit
Admission: RE | Admit: 2020-07-22 | Discharge: 2020-07-22 | Disposition: A | Discharge status: Home / Self Care | Payer: Medicare Other | Source: Ambulatory Visit | Attending: Neurology | Admitting: Neurology

## 2020-07-22 DIAGNOSIS — G20 Parkinson's disease: Secondary | ICD-10-CM

## 2020-07-22 DIAGNOSIS — G20C Parkinsonism, unspecified: Secondary | ICD-10-CM

## 2020-07-22 DIAGNOSIS — F02818 Alzheimer's disease with late onset: Secondary | ICD-10-CM

## 2020-07-22 MED ORDER — GADOBENATE DIMEGLUMINE 529 MG/ML IV SOLN
9.0000 mL | Freq: Once | INTRAVENOUS | Status: AC | PRN
  Administered 2020-07-22: 9 mL via INTRAVENOUS

## 2020-07-23 ENCOUNTER — Ambulatory Visit (INDEPENDENT_AMBULATORY_CARE_PROVIDER_SITE_OTHER): Payer: Medicare Other | Admitting: Family

## 2020-07-23 ENCOUNTER — Encounter: Payer: Self-pay | Admitting: Psychiatry

## 2020-07-23 ENCOUNTER — Ambulatory Visit (INDEPENDENT_AMBULATORY_CARE_PROVIDER_SITE_OTHER): Payer: Medicare Other | Admitting: Psychiatry

## 2020-07-23 ENCOUNTER — Other Ambulatory Visit: Payer: Self-pay

## 2020-07-23 VITALS — BP 106/70 | HR 89 | Temp 98.4°F | Ht 60.0 in | Wt 101.0 lb

## 2020-07-23 DIAGNOSIS — R32 Unspecified urinary incontinence: Secondary | ICD-10-CM | POA: Diagnosis not present

## 2020-07-23 DIAGNOSIS — F419 Anxiety disorder, unspecified: Secondary | ICD-10-CM | POA: Diagnosis not present

## 2020-07-23 DIAGNOSIS — F324 Major depressive disorder, single episode, in partial remission: Secondary | ICD-10-CM

## 2020-07-23 DIAGNOSIS — F0391 Unspecified dementia with behavioral disturbance: Secondary | ICD-10-CM | POA: Diagnosis not present

## 2020-07-23 MED ORDER — ESCITALOPRAM OXALATE 10 MG PO TABS: 15.0000 mg | ORAL_TABLET | Freq: Every day | ORAL | 1 refills | Status: DC

## 2020-07-23 NOTE — Progress Notes (Signed)
Brandi Walsh [id number] [birth year] 74 y.o.  Subjective:   Patient ID:  Brandi Walsh is a 74 y.o. (DOB [birth year]) female.  Chief Complaint:  Chief Complaint  Patient presents with   Other    Worsening cognitive decline, possible depression    HPI [name] presents to the office today for follow-up of depression, anxiety, and insomnia. She is accompanied by her granddaughter, [name]. [name] reports a sudden decline in pt's cognition.  PCP ordered palliative care. Has had continued weight loss. Has had several recent falls. New onset urinary and fecal incontinence over the past couple of weeks. Speech delays that granddaughter notices causes pt frustration. She reports that that pt will want family to not do some tasks because she wants it done in a certain way that she prefers.   Granddaughter reports that there are periods where they are able to engage in conversation and other times pt seems to have increased difficulty with communication.   Granddaughter states that pt reports she is sleeping ok. Pt's husband has observed her staying in bed. Family reports that her energy seems lower.  In the evening she will try to get up and perform various tasks. Has been refusing food and other assistance.[name] reports that pt is occasionally agitated. Taking medication with significant encouragement and assistance, such as family putting medication in her hand or mouth.   On exam pt reports that she is "fine." Granddaughter reports that she is not certain if pt is depressed or anxiety. Granddaughter reports that pt is not presenting as sad or tearful. [name] reports that pt is not happy with how she is feeling and loss of dentures.   [name] reports that pt has had continued poor appetite since d/c of Olanzapine and possible further decline in appetite.   [name] denies any signs of psychosis. No evidence of suicidal thoughts.   MRI on Monday. Has GI f/u on 08/06/20.   Past  Psychiatric Medication Trials: Lexapro Cymbalta Risperdal Olanzapine Remeron- Vivid dreams, nightmares  AIMS     Office Visit from 05/06/2020 in [facility] Office Visit from 08/08/2019 in [facility]  AIMS Total Score 6 1    PHQ2-9     Office Visit from 09/19/2019 in [facility]  PHQ-2 Total Score 0       Review of Systems:  Review of Systems  HENT: Positive for trouble swallowing.   Cardiovascular: Positive for leg swelling.       Right UE edema  Musculoskeletal: Negative for gait problem.  Neurological: Negative for tremors.  Psychiatric/Behavioral:       Please refer to HPI  Was on Lasix and this was stopped today.   Medications: I have reviewed the patient's current medications.  Current Outpatient Medications  Medication Sig Dispense Refill   Cholecalciferol (VITAMIN D) 50 MCG (2000 UT) tablet Take 2,000 Units by mouth daily.     diazepam (VALIUM) 5 MG tablet Take 1 tablet 30 minutes prior to MRI. May take second dose if needed. 2 tablet 0   donepezil (ARICEPT) 10 MG tablet Take 1 tablet daily 90 tablet 3   escitalopram (LEXAPRO) 10 MG tablet Take 1.5 tablets (15 mg total) by mouth daily. 45 tablet 1   losartan (COZAAR) 100 MG tablet Take 1 tablet by mouth once daily 90 tablet 0   omeprazole (PRILOSEC) 40 MG capsule Take 1 capsule (40 mg total) by mouth daily. 30 capsule 3   rosuvastatin (CRESTOR) 40 MG tablet  Take 1 tablet (40 mg total) by mouth daily. 90 tablet 1   No current facility-administered medications for this visit.    Medication Side Effects: None  Allergies:  Allergies  Allergen Reactions   Lovastatin     REACTION: Rash    Past Medical History:  Diagnosis Date   COUGH DUE TO ACE INHIBITORS 11/25/2009   Qualifier: Diagnosis of  By: Everardo All MD, Sean A    Depression    DIABETES MELLITUS, TYPE II 10/13/2007   Qualifier: Diagnosis of  By: Charlsie Quest RMA, Lucy     HYPERLIPIDEMIA  10/13/2007   Qualifier: Diagnosis of  By: Tyrone Apple, Lucy     HYPERTENSION 10/13/2007   Qualifier: Diagnosis of  By: Samara Snide     MENOPAUSAL SYNDROME 10/13/2007   Qualifier: Diagnosis of  By: Charlsie Quest RMA, Lucy     OSTEOPOROSIS 10/13/2007   Qualifier: Diagnosis of  By: Charlsie Quest RMA, Lucy      Family History  Problem Relation Age of Onset   Anxiety disorder Mother    Hyperparathyroidism Sister    Cancer Neg Hx    Breast cancer Neg Hx     Social History   Socioeconomic History   Marital status: Married    Spouse name: Not on file   Number of children: Not on file   Years of education: Not on file   Highest education level: Not on file  Occupational History   Occupation: Retired   Tobacco Use   Smoking status: Never Smoker   Smokeless tobacco: Never Used  Building services engineer Use: Never used  Substance and Sexual Activity   Alcohol use: No   Drug use: No   Sexual activity: Yes  Other Topics Concern   Not on file  Social History Narrative   Left Handed   Two Story Home   Lives with Husband    Drinks coffee and [employer] sometimes   Social Determinants of Health   Financial Resource Strain:    Difficulty of Paying Living Expenses: Not on file  Food Insecurity:    Worried About Programme researcher, broadcasting/film/video in the Last Year: Not on file   [employer] of Food in the Last Year: Not on file  Transportation Needs:    Lack of Transportation (Medical): Not on file   Lack of Transportation (Non-Medical): Not on file  Physical Activity:    Days of Exercise per Week: Not on file   Minutes of Exercise per Session: Not on file  Stress:    Feeling of Stress : Not on file  Social Connections:    Frequency of Communication with Friends and Family: Not on file   Frequency of Social Gatherings with Friends and Family: Not on file   Attends Religious Services: Not on file   Active Member of Clubs or Organizations: Not on file   Attends Banker  Meetings: Not on file   Marital Status: Not on file  Intimate Partner Violence:    Fear of Current or Ex-Partner: Not on file   Emotionally Abused: Not on file   Physically Abused: Not on file   Sexually Abused: Not on file    Past Medical History, Surgical history, Social history, and Family history were reviewed and updated as appropriate.   Please see review of systems for further details on the patient's review from today.   Objective:   Physical Exam:  Wt 101 lb (45.8 kg)    BMI 19.73 kg/m  Physical Exam Constitutional:      General: She is not in acute distress. Musculoskeletal:        General: No deformity.  Neurological:     Mental Status: She is alert.     Coordination: Coordination normal.  Psychiatric:        Attention and Perception: Perception normal. She does not perceive auditory or visual hallucinations.        Mood and Affect: Mood is depressed. Mood is not anxious. Affect is blunt. Affect is not labile, angry or inappropriate.        Behavior: Behavior is slowed and withdrawn.        Thought Content: Thought content normal. Thought content is not paranoid or delusional. Thought content does not include homicidal or suicidal ideation. Thought content does not include homicidal or suicidal plan.        Cognition and Memory: Cognition and memory normal.        Judgment: Judgment normal.     Comments: Speech is slowed. Mostly non-verbal on exam     Lab Review:     Component Value Date/Time   NA 139 07/03/2020 1732   K 3.7 07/03/2020 1732   CL 106 07/03/2020 1732   CO2 28 07/03/2020 1732   GLUCOSE 114 (H) 07/03/2020 1732   BUN 13 07/03/2020 1732   CREATININE 0.88 07/03/2020 1732   CREATININE 0.80 05/28/2020 1336   CALCIUM 10.3 07/03/2020 1732   CALCIUM 10.8 (H) 02/19/2012 1129   PROT 6.0 (L) 07/03/2020 1732   ALBUMIN 3.6 07/03/2020 1732   AST 22 07/03/2020 1732   ALT 29 07/03/2020 1732   ALKPHOS 70 07/03/2020 1732   BILITOT 1.4 (H) 07/03/2020  1732   GFRNONAA >60 07/03/2020 1732   GFRAA >60 07/03/2020 1732       Component Value Date/Time   WBC 8.4 07/03/2020 1732   RBC 4.37 07/03/2020 1732   HGB 13.8 07/03/2020 1732   HGB 15.3 12/16/2010 1045   HCT 41.1 07/03/2020 1732   HCT 43.8 12/16/2010 1045   PLT 195 07/03/2020 1732   PLT 224 12/16/2010 1045   MCV 94.1 07/03/2020 1732   MCV 88.9 12/16/2010 1045   MCH 31.6 07/03/2020 1732   MCHC 33.6 07/03/2020 1732   RDW 13.4 07/03/2020 1732   RDW 12.5 12/16/2010 1045   LYMPHSABS 1.6 07/03/2020 1732   LYMPHSABS 2.1 12/16/2010 1045   MONOABS 0.7 07/03/2020 1732   MONOABS 0.8 12/16/2010 1045   EOSABS 0.0 07/03/2020 1732   EOSABS 0.3 12/16/2010 1045   BASOSABS 0.0 07/03/2020 1732   BASOSABS 0.1 12/16/2010 1045    No results found for: POCLITH, LITHIUM   No results found for: PHENYTOIN, PHENOBARB, VALPROATE, CBMZ   .res Assessment: Plan:   Discussed possible treatment options to include increasing Lexapro to treat possible depression since pt is exhibiting decreased appetite, lower energy, and is more socially withdrawn. Will increase Lexapro to 15 mg po qd. Agree with current plan to continue to rule out medical causes for recent sudden change in mental status. Pt exhibits significant overall cognitive and physical decline compared to last visit on 05/06/20. Pt to f/u in 4 weeks or sooner if clinically indicated.  Patient advised to contact office with any questions, adverse effects, or acute worsening in signs and symptoms.  Faylene was seen today for other.  Diagnoses and all orders for this visit:  Major depressive disorder in partial remission, unspecified whether recurrent (HCC) -     escitalopram (LEXAPRO) 10  MG tablet; Take 1.5 tablets (15 mg total) by mouth daily.  Anxiety disorder, unspecified type -     escitalopram (LEXAPRO) 10 MG tablet; Take 1.5 tablets (15 mg total) by mouth daily.     Please see After Visit Summary for patient specific  instructions.  Future Appointments  Date Time Provider Department Center  08/06/2020  1:00 PM Corie Chiquitoarter, Janell Keeling, PMHNP [facility] None  08/06/2020  3:00 PM Shellia CleverlyCirigliano, [name], DO [facility]  08/20/2020 10:30 AM Corie Chiquitoarter, Ethelreda Sukhu, PMHNP [facility] None  01/27/2021  8:30 AM Van ClinesAquino, Karen M, MD [facility] None    No orders of the defined types were placed in this encounter.   -------------------------------

## 2020-07-23 NOTE — Progress Notes (Signed)
Brandi Walsh is a 74 y.o. female with the following history as recorded in EpicCare:  Patient Active Problem List   Diagnosis Date Noted  . 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  . Abnormal liver enzymes 09/19/2019  . Blurred vision 09/19/2019  . Hypercalcemia 03/15/2019  . Major depression in full remission ([place]) 11/24/2018  . Memory loss 12/24/2016  . Encounter for well adult exam with abnormal findings 06/13/2013  . Hyperparathyroidism, primary ([place]) 06/12/2013  . GERD (gastroesophageal reflux disease) 02/19/2012  . 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  . COUGH DUE TO ACE INHIBITORS 11/25/2009  . Diabetes ([place]) 10/13/2007  . Dyslipidemia 10/13/2007  . Essential hypertension 10/13/2007  . MENOPAUSAL SYNDROME 10/13/2007  . Osteoporosis 10/13/2007    Current Outpatient Medications  Medication Sig Dispense Refill  . Cholecalciferol (VITAMIN D) 50 MCG (2000 UT) tablet Take 2,000 Units by mouth daily.    . diazepam (VALIUM) 5 MG tablet Take 1 tablet 30 minutes prior to MRI. May take second dose if needed. 2 tablet 0  . donepezil (ARICEPT) 10 MG tablet Take 1 tablet daily 90 tablet 3  . escitalopram (LEXAPRO) 10 MG tablet Take 1 tablet (10 mg total) by mouth daily. 90 tablet 0  . losartan (COZAAR) 100 MG tablet Take 1 tablet by mouth once daily 90 tablet 0  . omeprazole (PRILOSEC) 40 MG capsule Take 1 capsule (40 mg total) by mouth daily. 30 capsule 3  . rosuvastatin (CRESTOR) 40 MG tablet Take 1 tablet (40 mg total) by mouth daily. 90 tablet 1   No current facility-administered medications for this visit.    Allergies: Lovastatin  Past Medical History:  Diagnosis Date  . COUGH DUE TO ACE INHIBITORS 11/25/2009   Qualifier: Diagnosis of  By: [name] MD, [name]   . Depression   . DIABETES MELLITUS, TYPE II 10/13/2007   Qualifier: Diagnosis of  By: Marca Ancona RMA, Lucy    . HYPERLIPIDEMIA 10/13/2007   Qualifier: Diagnosis of  By: Reatha Armour, Lucy    . HYPERTENSION 10/13/2007   Qualifier: Diagnosis of  By:  Reatha Armour, Lucy    . MENOPAUSAL SYNDROME 10/13/2007   Qualifier: Diagnosis of  By: Marca Ancona RMA, Lucy    . OSTEOPOROSIS 10/13/2007   Qualifier: Diagnosis of  By: Larose Kells      Past Surgical History:  Procedure Laterality Date  . ABDOMINAL HYSTERECTOMY    . BREAST BIOPSY  08/2002  . TONSILLECTOMY      Family History  Problem Relation Age of Onset  . Anxiety disorder Mother   . Hyperparathyroidism Sister   . Cancer Neg Hx   . Breast cancer Neg Hx     Social History   Tobacco Use  . Smoking status: Never Smoker  . Smokeless tobacco: Never Used  Substance Use Topics  . Alcohol use: No    Subjective:  Patient is accompanied by husband and granddaughter; concerns about worsening dementia/ lack of appetite and continued weight loss; Family is wanting to keep patient at home as long as possible; granddaughter is looking into home care options;  Would be open to palliative care referral/ home health has already been established; not ready for hospice;    Objective:  Vitals:   07/23/20 0924  BP: 106/70  Pulse: 89  Temp: 98.4 F (36.9 C)  TempSrc: Oral  SpO2: 99%  Weight: 101 lb (45.8 kg)  Height: 5' (1.524 m)    General: Frail appearing,  in no acute distress  Skin : Warm and dry.  Head: Normocephalic  and atraumatic  Lungs: Respirations unlabored; clear to auscultation bilaterally without wheeze, rales, rhonchi  CVS exam: normal rate and regular rhythm.  Neurologic: Limited participation in office visit; shuffled gait  Assessment:  1. Dementia with behavioral disturbance, unspecified dementia type ([place])   2. Urinary incontinence, unspecified type     Plan:  Will stop Lasix due to limited benefit; suspect swelling is due to prolonged sitting- does have compression stockings; will rule out urinary tract infection; Discussed need for family to have discussion about long term care goals for patient and agree that palliative care referral is appropriate; Follow-up to  be determined;   Time spent 35 minutes  No follow-ups on file.  Orders Placed This Encounter  Procedures  . Urine Culture    Standing Status:   Future    Number of Occurrences:   1    Standing Expiration Date:   07/23/2021  . CBC with Differential/Platelet    Standing Status:   Future    Number of Occurrences:   1    Standing Expiration Date:   07/23/2021  . Comp Met (CMET)    Standing Status:   Future    Number of Occurrences:   1    Standing Expiration Date:   07/23/2021  . Urinalysis    Standing Status:   Future    Number of Occurrences:   1    Standing Expiration Date:   07/23/2021  . Amb Referral to Palliative Care    Referral Priority:   Routine    Referral Type:   Consultation    Number of Visits Requested:   1    Requested Prescriptions    No prescriptions requested or ordered in this encounter

## 2020-07-24 ENCOUNTER — Telehealth: Payer: Self-pay | Admitting: Family

## 2020-07-24 LAB — URINALYSIS
Bilirubin Urine: NEGATIVE
Glucose, UA: NEGATIVE
Ketones, ur: NEGATIVE
Leukocytes,Ua: NEGATIVE
Nitrite: NEGATIVE
Specific Gravity, Urine: 1.015 (ref 1.001–1.03)
pH: 6 (ref 5.0–8.0)

## 2020-07-24 LAB — CBC WITH DIFFERENTIAL/PLATELET
Absolute Monocytes: 894 cells/uL (ref 200–950)
Basophils Absolute: 42 cells/uL (ref 0–200)
Basophils Relative: 0.4 %
Eosinophils Absolute: 73 cells/uL (ref 15–500)
Eosinophils Relative: 0.7 %
HCT: 43.4 % (ref 35.0–45.0)
Hemoglobin: 14.9 g/dL (ref 11.7–15.5)
Lymphs Abs: 1435 cells/uL (ref 850–3900)
MCH: 32 pg (ref 27.0–33.0)
MCHC: 34.3 g/dL (ref 32.0–36.0)
MCV: 93.1 fL (ref 80.0–100.0)
MPV: 10.7 fL (ref 7.5–12.5)
Monocytes Relative: 8.6 %
Neutro Abs: 7956 cells/uL — ABNORMAL HIGH (ref 1500–7800)
Neutrophils Relative %: 76.5 %
Platelets: 246 10*3/uL (ref 140–400)
RBC: 4.66 10*6/uL (ref 3.80–5.10)
RDW: 13.3 % (ref 11.0–15.0)
Total Lymphocyte: 13.8 %
WBC: 10.4 10*3/uL (ref 3.8–10.8)

## 2020-07-24 LAB — COMPREHENSIVE METABOLIC PANEL
AG Ratio: 1.7 (calc) (ref 1.0–2.5)
ALT: 33 U/L — ABNORMAL HIGH (ref 6–29)
AST: 27 U/L (ref 10–35)
Albumin: 3.8 g/dL (ref 3.6–5.1)
Alkaline phosphatase (APISO): 79 U/L (ref 37–153)
BUN/Creatinine Ratio: 15 (calc) (ref 6–22)
BUN: 14 mg/dL (ref 7–25)
CO2: 30 mmol/L (ref 20–32)
Calcium: 10.2 mg/dL (ref 8.6–10.4)
Chloride: 97 mmol/L — ABNORMAL LOW (ref 98–110)
Creat: 0.96 mg/dL — ABNORMAL HIGH (ref 0.60–0.93)
Globulin: 2.3 g/dL (calc) (ref 1.9–3.7)
Glucose, Bld: 238 mg/dL — ABNORMAL HIGH (ref 65–99)
Potassium: 3.3 mmol/L — ABNORMAL LOW (ref 3.5–5.3)
Sodium: 137 mmol/L (ref 135–146)
Total Bilirubin: 1.1 mg/dL (ref 0.2–1.2)
Total Protein: 6.1 g/dL (ref 6.1–8.1)

## 2020-07-24 LAB — URINE CULTURE

## 2020-07-24 NOTE — Telephone Encounter (Signed)
Okay to give orders as requested;  

## 2020-07-24 NOTE — Telephone Encounter (Signed)
Home Health Requesting orders for PT : 1x1 2x3 1x1  Charri [phone] Okay to LVM

## 2020-07-25 ENCOUNTER — Telehealth: Payer: Self-pay | Admitting: Family

## 2020-07-25 NOTE — Telephone Encounter (Signed)
Okay to give orders as requested;  

## 2020-07-25 NOTE — Telephone Encounter (Signed)
    Neysa Bonito from [facility] calling to request verbal order for OT eval  Call [phone], ok to leave message

## 2020-07-25 NOTE — Telephone Encounter (Signed)
Verbal orders left on voicemail today.

## 2020-07-26 NOTE — Telephone Encounter (Signed)
Spoke with [place] and verbals given.

## 2020-07-29 ENCOUNTER — Other Ambulatory Visit: Payer: Self-pay | Admitting: Family

## 2020-07-29 MED ORDER — POTASSIUM CHLORIDE ER 10 MEQ PO TBCR: 10.0000 meq | EXTENDED_RELEASE_TABLET | Freq: Every day | ORAL | 0 refills | Status: DC

## 2020-07-30 ENCOUNTER — Other Ambulatory Visit: Payer: Self-pay | Admitting: Family

## 2020-07-30 ENCOUNTER — Telehealth: Payer: Self-pay | Admitting: Internal Medicine

## 2020-07-30 NOTE — Telephone Encounter (Signed)
Called patient's home number to schedule the Palliative Consult, no answer and unable to leave a message due to no voicemail.  I then called cell number listed in Epic for patient and spoke with patient's granddaughter, Alayzia Pavlock.  Discussed Palliative services with her and all questions were answered and she was in agreement with scheduling visit.  I have scheduled an In-person Consult for 08/07/20 @ 8:30 AM.

## 2020-07-31 ENCOUNTER — Telehealth: Payer: Self-pay | Admitting: Neurology

## 2020-07-31 NOTE — Telephone Encounter (Signed)
[facility] and informed her of what was reccommended by Dr. Karel Jarvis to take pt to the ER to be evaluated and treated as needed. [name] expressed understanding.

## 2020-07-31 NOTE — Telephone Encounter (Signed)
Pls let Brandi Walsh know that this is not typical for Alzheimer's by itself. With sudden changes like these, especially if not verbal, she will need to go to the ER. I am only outpatient and cannot do inpatient admission, sorry.

## 2020-07-31 NOTE — Telephone Encounter (Signed)
I called Brandi Walsh back to get more details and she said, "Eating has become more difficult, markedly. She's not verbal. She can drink very well and has to use a straw. I'd really like to know if this is normal for alzheimers or if I need to take her to the hospital. She is having problems swallowing also. Can Dr. Karel Jarvis do direct admit. Or, can the doctor see her tomorrow?"

## 2020-07-31 NOTE — Telephone Encounter (Signed)
Patient's granddaughter, [name], called in tears stating, "My grandmother needs to see Dr. Karel Jarvis as soon as possible. She has really gone down hill."   I asked for more details and [name] said she'd like a call back about this. She seemed overwhelmed with emotion disconnected the call requesting a call back from a nurse.

## 2020-08-01 ENCOUNTER — Encounter: Payer: Self-pay | Admitting: Family

## 2020-08-02 ENCOUNTER — Encounter: Payer: Self-pay | Admitting: Family

## 2020-08-02 ENCOUNTER — Telehealth: Payer: Self-pay | Admitting: Family

## 2020-08-02 ENCOUNTER — Ambulatory Visit (INDEPENDENT_AMBULATORY_CARE_PROVIDER_SITE_OTHER): Payer: Medicare Other | Admitting: Family

## 2020-08-02 ENCOUNTER — Other Ambulatory Visit: Payer: Self-pay

## 2020-08-02 VITALS — BP 98/60 | HR 75 | Temp 98.7°F | Ht 60.0 in

## 2020-08-02 DIAGNOSIS — I10 Essential (primary) hypertension: Secondary | ICD-10-CM

## 2020-08-02 DIAGNOSIS — L089 Local infection of the skin and subcutaneous tissue, unspecified: Secondary | ICD-10-CM | POA: Diagnosis not present

## 2020-08-02 DIAGNOSIS — F0391 Unspecified dementia with behavioral disturbance: Secondary | ICD-10-CM | POA: Diagnosis not present

## 2020-08-02 DIAGNOSIS — E785 Hyperlipidemia, unspecified: Secondary | ICD-10-CM | POA: Diagnosis not present

## 2020-08-02 MED ORDER — MUPIROCIN 2 % EX OINT: TOPICAL_OINTMENT | Freq: Three times a day (TID) | CUTANEOUS | 0 refills | Status: DC

## 2020-08-02 MED ORDER — LOSARTAN POTASSIUM 50 MG PO TABS
50.0000 mg | ORAL_TABLET | Freq: Every day | ORAL | 1 refills | Status: DC
Start: 2020-08-02 — End: 2021-01-10

## 2020-08-02 NOTE — Telephone Encounter (Signed)
   Granddaughter requesting TSH be added to current lab draw.

## 2020-08-02 NOTE — Progress Notes (Signed)
Brandi Walsh is a 74 y.o. female with the following history as recorded in EpicCare:  Patient Active Problem List   Diagnosis Date Noted  . 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  . Abnormal liver enzymes 09/19/2019  . Blurred vision 09/19/2019  . Hypercalcemia 03/15/2019  . Major depression in full remission ([place]) 11/24/2018  . Memory loss 12/24/2016  . Encounter for well adult exam with abnormal findings 06/13/2013  . Hyperparathyroidism, primary ([place]) 06/12/2013  . GERD (gastroesophageal reflux disease) 02/19/2012  . 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  . COUGH DUE TO ACE INHIBITORS 11/25/2009  . Diabetes ([place]) 10/13/2007  . Dyslipidemia 10/13/2007  . Essential hypertension 10/13/2007  . MENOPAUSAL SYNDROME 10/13/2007  . Osteoporosis 10/13/2007    Current Outpatient Medications  Medication Sig Dispense Refill  . Cholecalciferol (VITAMIN D) 50 MCG (2000 UT) tablet Take 2,000 Units by mouth daily.    . diazepam (VALIUM) 5 MG tablet Take 1 tablet 30 minutes prior to MRI. May take second dose if needed. 2 tablet 0  . donepezil (ARICEPT) 10 MG tablet Take 1 tablet daily 90 tablet 3  . escitalopram (LEXAPRO) 10 MG tablet Take 1.5 tablets (15 mg total) by mouth daily. 45 tablet 1  . losartan (COZAAR) 50 MG tablet Take 1 tablet (50 mg total) by mouth daily. 90 tablet 1  . mupirocin ointment (BACTROBAN) 2 % Apply topically 3 (three) times daily. 22 g 0  . omeprazole (PRILOSEC) 40 MG capsule Take 1 capsule (40 mg total) by mouth daily. 30 capsule 3  . rosuvastatin (CRESTOR) 40 MG tablet Take 1 tablet (40 mg total) by mouth daily. 90 tablet 1   No current facility-administered medications for this visit.    Allergies: Lovastatin  Past Medical History:  Diagnosis Date  . COUGH DUE TO ACE INHIBITORS 11/25/2009   Qualifier: Diagnosis of  By: [name] MD, [name]   . Depression   . DIABETES MELLITUS, TYPE II 10/13/2007   Qualifier: Diagnosis of  By: Marca Ancona RMA, Lucy    . HYPERLIPIDEMIA 10/13/2007   Qualifier:  Diagnosis of  By: Reatha Armour, Lucy    . HYPERTENSION 10/13/2007   Qualifier: Diagnosis of  By: Reatha Armour, Lucy    . MENOPAUSAL SYNDROME 10/13/2007   Qualifier: Diagnosis of  By: Marca Ancona RMA, Lucy    . OSTEOPOROSIS 10/13/2007   Qualifier: Diagnosis of  By: Larose Kells      Past Surgical History:  Procedure Laterality Date  . ABDOMINAL HYSTERECTOMY    . BREAST BIOPSY  08/2002  . TONSILLECTOMY      Family History  Problem Relation Age of Onset  . Anxiety disorder Mother   . Hyperparathyroidism Sister   . Cancer Neg Hx   . Breast cancer Neg Hx     Social History   Tobacco Use  . Smoking status: Never Smoker  . Smokeless tobacco: Never Used  Substance Use Topics  . Alcohol use: No    Subjective:  Accompanied by son and granddaughter; asking to have labs re-checked- potassium was down at last OV; Since last OV, has had MRI with neurology- confirmed brain atrophy; palliative care is scheduled for next week; family has been able to help patient have daily sitter;  Home health nurse noticed "small sore" on left side;    Objective:  Vitals:   08/02/20 1416  BP: 98/60  Pulse: 75  Temp: 98.7 F (37.1 C)  TempSrc: Oral  SpO2: 99%  Height: 5' (1.524 m)    General: Well developed, well nourished, in  no acute distress  Skin : Warm and dry. Limited visualization but small sore noted over left flank Head: Normocephalic and atraumatic  Eyes: Sclera and conjunctiva clear; pupils round and reactive to light; extraocular movements intact  Ears: External normal; canals clear; tympanic membranes normal  Oropharynx: Pink, supple. No suspicious lesions  Neck: Supple without thyromegaly, adenopathy  Extremities: bilateral edema, no cyanosis, no clubbing  Vessels: Symmetric bilaterally  Neurologic: Does not participate in visit; not alert or oriented; in wheelchair;    Assessment:  1. Hyperlipidemia, unspecified hyperlipidemia type   2. Essential hypertension   3. Dementia with  behavioral disturbance, unspecified dementia type ([place])   4. Skin infection     Plan:  1. Check lipid panel today; may need to lower dosage of Crestor with weight loss; 2. Lower Losartan dosage to 50 mg daily; may need to d/c completely; follow-up to be determined; 3. Under care of neurology; palliative care pending; family is not ready to discuss hospice care; 4. Family defers oral medication; trial of Bactroban tid to affected area; follow-up worse, no better; discussed need to move her every 2-4 hours to help prevent skin breakdown.   This visit occurred during the SARS-CoV-2 public health emergency.  Safety protocols were in place, including screening questions prior to the visit, additional usage of staff PPE, and extensive cleaning of exam room while observing appropriate contact time as indicated for disinfecting solutions.     No follow-ups on file.  Orders Placed This Encounter  Procedures  . Comp Met (CMET)    Standing Status:   Future    Number of Occurrences:   1    Standing Expiration Date:   08/02/2021  . Lipid panel    Standing Status:   Future    Number of Occurrences:   1    Standing Expiration Date:   08/02/2021    Requested Prescriptions   Signed Prescriptions Disp Refills  . losartan (COZAAR) 50 MG tablet 90 tablet 1    Sig: Take 1 tablet (50 mg total) by mouth daily.  . mupirocin ointment (BACTROBAN) 2 % 22 g 0    Sig: Apply topically 3 (three) times daily.

## 2020-08-03 LAB — COMPREHENSIVE METABOLIC PANEL
AG Ratio: 1.5 (calc) (ref 1.0–2.5)
ALT: 60 U/L — ABNORMAL HIGH (ref 6–29)
AST: 38 U/L — ABNORMAL HIGH (ref 10–35)
Albumin: 3.5 g/dL — ABNORMAL LOW (ref 3.6–5.1)
Alkaline phosphatase (APISO): 102 U/L (ref 37–153)
BUN: 14 mg/dL (ref 7–25)
CO2: 28 mmol/L (ref 20–32)
Calcium: 10.7 mg/dL — ABNORMAL HIGH (ref 8.6–10.4)
Chloride: 102 mmol/L (ref 98–110)
Creat: 0.73 mg/dL (ref 0.60–0.93)
Globulin: 2.4 g/dL (calc) (ref 1.9–3.7)
Glucose, Bld: 135 mg/dL — ABNORMAL HIGH (ref 65–99)
Potassium: 3.8 mmol/L (ref 3.5–5.3)
Sodium: 138 mmol/L (ref 135–146)
Total Bilirubin: 1.5 mg/dL — ABNORMAL HIGH (ref 0.2–1.2)
Total Protein: 5.9 g/dL — ABNORMAL LOW (ref 6.1–8.1)

## 2020-08-03 LAB — LIPID PANEL
Cholesterol: 121 mg/dL (ref <200)
HDL: 44 mg/dL — ABNORMAL LOW (ref >=50)
LDL Cholesterol (Calc): 59 mg/dL (calc)
Non-HDL Cholesterol (Calc): 77 mg/dL (calc) (ref <130)
Total CHOL/HDL Ratio: 2.8 (calc) (ref <5.0)
Triglycerides: 92 mg/dL (ref <150)

## 2020-08-06 ENCOUNTER — Ambulatory Visit: Payer: Medicare Other | Admitting: Psychiatry

## 2020-08-06 ENCOUNTER — Telehealth: Payer: Self-pay | Admitting: Neurology

## 2020-08-06 ENCOUNTER — Telehealth: Payer: Self-pay | Admitting: Psychiatry

## 2020-08-06 ENCOUNTER — Encounter: Payer: Self-pay | Admitting: Gastroenterology

## 2020-08-06 ENCOUNTER — Ambulatory Visit (INDEPENDENT_AMBULATORY_CARE_PROVIDER_SITE_OTHER): Payer: Medicare Other | Admitting: Gastroenterology

## 2020-08-06 VITALS — BP 116/68 | HR 93 | Ht 62.0 in | Wt 103.0 lb

## 2020-08-06 DIAGNOSIS — R634 Abnormal weight loss: Secondary | ICD-10-CM | POA: Diagnosis not present

## 2020-08-06 DIAGNOSIS — R1310 Dysphagia, unspecified: Secondary | ICD-10-CM

## 2020-08-06 DIAGNOSIS — R7401 Elevation of levels of liver transaminase levels: Secondary | ICD-10-CM

## 2020-08-06 DIAGNOSIS — K222 Esophageal obstruction: Secondary | ICD-10-CM

## 2020-08-06 MED ORDER — OMEPRAZOLE 20 MG PO CPDR
20.0000 mg | DELAYED_RELEASE_CAPSULE | Freq: Every day | ORAL | 5 refills | Status: DC
Start: 2020-08-06 — End: 2021-11-11

## 2020-08-06 NOTE — Patient Instructions (Addendum)
If you are age 74 or older, your body mass index should be between 23-30. Your Body mass index is 18.84 kg/m. If this is out of the aforementioned range listed, please consider follow up with your Primary Care Provider.  If you are age 60 or younger, your body mass index should be between 19-25. Your Body mass index is 18.84 kg/m. If this is out of the aformentioned range listed, please consider follow up with your Primary Care Provider.   We have sent the following medications to your pharmacy for you to pick up at your convenience: Prilosec 20 mg   Follow up as needed.  It was a pleasure to see you today!  Vito [name], D.O.

## 2020-08-06 NOTE — Telephone Encounter (Signed)
Please review

## 2020-08-06 NOTE — Telephone Encounter (Signed)
Brandi Walsh needed to talk with you. Pt is having some hallucinations, people coming in the night, also hearing bells ringing during day.With the alzeihmers dx, not sure how to approach this. She has also called neurologist for advise too.

## 2020-08-06 NOTE — Telephone Encounter (Signed)
Patient granddaughter would like to speak to someone about patient. She states that the patient is having sun downing there are times she states that she is seeing and hearing things that are not there. She is refusing to do things that she is asked to do such as if they tell her  To come on she will sit in the floor and refuse to move or walk.  Please call

## 2020-08-06 NOTE — Telephone Encounter (Signed)
Called granddaughter who reports that pt is having hallucinations at night to include thinking that people are coming into the home. She is also hearing bells ringing during the day. [name] reports that they are waiting to see GI specialist at time of phone call and on the way to the apt Ms. Iqbal insisted the car was about to blow up with all of them inside of it. [name] reports that pt is more agitated in the evenings and that pt's husband reports that he has been sleeping very little due to trying to redirect and calm pt. Pt's husband reports that once pt gets into bed that she seems to settle but it is unclear how much she is sleeping. [name] reports that family is awaiting hospice and palliative care consult. [name] has also contacted Dr. Rosalyn Gess office re: recent changes.   Discussed tx options to include option to re-start Olanzapine to target hallucinations and that Olanzapine may also improve sleep and appetite. Discussed risks and benefits with possible Parkinsonism and TD. Discussed that Seroquel is another atypical antipsychotic option and that both Olanzapine and Seroquel have lower risk of TD and Parkinsonism compared to other anti-psychotics. Also discussed considering low dose Depakote to improve agitation if family's wishes are to avoid an anti-psychotic, however this would not treat hallucinations. Encouraged granddaughter to also discuss risks and a benefits with Dr. Karel Jarvis. Granddaughter requests limited number of Diazepam 5 mg prn agitation since pt has tolerated this well prior to procedures to help with acute agitation until long-term plan can be established. Will send in script. Encouraged pt to talk with neurologist, palliative/hospice care, and family and to contact this provider with any questions or if they would like to initiate a treatment option.

## 2020-08-06 NOTE — Progress Notes (Signed)
P  Chief Complaint:    Procedure follow-up, ultrasound follow-up  GI History: 74 year old female with history of diabetes, HTN, osteoporosis, depression, dementia with severe memory loss, hyperparathyroidism, referred to the GI clinic in 05/2020 for evaluation of dysphagia, weight loss, elevated liver enzymes.  1) Dysphagia, weight loss: Lost approximately 13 pounds between March 2021 and July 2021, and continued weight loss since then.  Weight loss now mostly stems from not wanting to eat/not meeting goal caloric intake. -EGD (06/11/2020): Mild Schatzki's ring dilated with 20 mm TTS balloon without mucosal rent.  Empirically dilated remainder of the esophagus with 20 mm TTS balloon inflated and dragged through esophagus without mucosal change.  20 mm hyperplastic polyp in gastric antrum (biopsies benign).  Benign fundic gland polyps. -Resolution of dysphagia since EGD with empiric dilation.  Patient still with poor p.o. intake,  2) Elevated liver enzymes: Intermittent mild elevations of ALT> AST, less than 3 times ULN.  Hepatitis B/C negative in 08/2019. -Peak AST/ALT 47/100 in 08/2019. -Intermittently mildly elevated T bili, with peak 2.0, all indirect consistent with Gilbert's -Normal ALP, albumin -RUQ [place] (05/2020): Fatty liver infiltration, otherwise normal  HPI:     Patient is a 74 y.o. female presenting to the Gastroenterology Clinic for procedure follow-up.  Husband and granddaughter provide her history.  They report that she has had no dysphagia or "choking episodes" since her recent EGD with dilation.  Tolerating all p.o. intake, but still with poor total caloric intake.  This mostly stems from the patient not wanting to eat and underlying dementia rather than difficulty eating.  Otherwise, no new concerns or issues today.  She is scheduled for Palliative Care evaluation tomorrow.  Hx of hyperpara; previously followed with Dr. [name] at [facility], and more recently establish  with a new Endocrinologist per her granddaughter.  Did have questions about whether or not G-tube placement would be indicated.  08/02/2020 -CMP: Calcium 10.7, albumin 3.5, bili 1.5, AST/ALT 30/60, normal ALP, normal BUN/creatinine -CBC: Normal  Hepatic Function Latest Ref Rng & Units 08/02/2020 07/23/2020 07/03/2020  Total Protein 6.1 - 8.1 g/dL 5.9(L) 6.1 6.0(L)  Albumin 3.5 - 5.0 g/dL - - 3.6  AST 10 - 35 U/L 38(H) 27 22  ALT 6 - 29 U/L 60(H) 33(H) 29  Alk Phosphatase 38 - 126 U/L - - 70  Total Bilirubin 0.2 - 1.2 mg/dL 1.5(H) 1.1 1.4(H)  Bilirubin, Direct 0.0 - 0.3 mg/dL - - -   Weight check: -11/22/2019: 122# -02/21/2020: 122# -04/22/2020: 116# -06/07/2020: 108# -08/06/2020: 103#  Review of systems:     No chest pain, no SOB, no fevers, no urinary sx   Past Medical History:  Diagnosis Date  . Alzheimer's disease ([place])   . COUGH DUE TO ACE INHIBITORS 11/25/2009   Qualifier: Diagnosis of  By: [name] MD, [name]   . Depression   . DIABETES MELLITUS, TYPE II 10/13/2007   Qualifier: Diagnosis of  By: Marca Ancona RMA, Lucy    . HYPERLIPIDEMIA 10/13/2007   Qualifier: Diagnosis of  By: Reatha Armour, Lucy    . HYPERTENSION 10/13/2007   Qualifier: Diagnosis of  By: Reatha Armour, Lucy    . MENOPAUSAL SYNDROME 10/13/2007   Qualifier: Diagnosis of  By: Marca Ancona RMA, Lucy    . OSTEOPOROSIS 10/13/2007   Qualifier: Diagnosis of  By: Larose Kells      Patient's surgical history, family medical history, social history, medications and allergies were all reviewed in Epic    Current Outpatient Medications  Medication Sig Dispense Refill  . Cholecalciferol (VITAMIN D) 50 MCG (2000 UT) tablet Take 2,000 Units by mouth daily.    Marland Kitchen donepezil (ARICEPT) 10 MG tablet Take 1 tablet daily 90 tablet 3  . escitalopram (LEXAPRO) 10 MG tablet Take 1.5 tablets (15 mg total) by mouth daily. 45 tablet 1  . losartan (COZAAR) 50 MG tablet Take 1 tablet (50 mg total) by mouth daily. 90 tablet 1  . mupirocin ointment  (BACTROBAN) 2 % Apply topically 3 (three) times daily. 22 g 0  . omeprazole (PRILOSEC) 40 MG capsule Take 1 capsule (40 mg total) by mouth daily. 30 capsule 3  . rosuvastatin (CRESTOR) 40 MG tablet Take 1 tablet (40 mg total) by mouth daily. 90 tablet 1   No current facility-administered medications for this visit.    Physical Exam:     BP 116/68   Pulse 93   Ht _0  (1.575 m)   Wt 103 lb (46.7 kg)   BMI 18.84 kg/m   GENERAL:  Pleasant female in NAD.  Sitting comfortably in wheelchair.  Nonverbal throughout entire appointment, but did make eye contact conversation. SKIN:  turgor, no lesions seen.  Bilateral lower extremity edema to ankles   IMPRESSION and PLAN:    1) Dysphagia 2) Schatzki's ring -Dysphagia resolved with recent EGD with dilation -Encouraged to continue decreasing p.o. intake as tolerated  3) Weight loss 4) Failure to thrive 5) Dementia -Related to underlying dementia and patient not wanting to eat.  Has appointment with Palliative Care evaluation tomorrow. -Encouraged high-calorie foods/drinks -Discussed referral to Nutrition Clinic.  They are hoping this also gets coordinated through Palliative care, but if it is not, can call me and we can arrange for referral  6) GERD -Well-controlled on current therapy -Can decrease Prilosec to 20 mg/day   7) Hypercalcemia -Follows in the Endocrinology Clinic for hyperparathyroidism   8) Gilbert's syndrome 9) Elevated AST/ALT -Mildly elevated liver enzymes.  Ultrasound otherwise unrevealing -Intermittent, mildly elevated T bili consistent with benign Gilbert's syndrome.  Discussed this with the family -No further evaluation needed at this time  Transitioning to Palliative Care per patient family.  RTC as needed           Lavena Bullion ,DO, FACG 08/06/2020, 3:28 PM

## 2020-08-07 ENCOUNTER — Other Ambulatory Visit: Payer: Self-pay | Admitting: Family

## 2020-08-07 ENCOUNTER — Other Ambulatory Visit: Payer: Self-pay

## 2020-08-07 ENCOUNTER — Encounter: Payer: Self-pay | Admitting: Family

## 2020-08-07 ENCOUNTER — Other Ambulatory Visit: Payer: Medicare Other | Admitting: Internal Medicine

## 2020-08-07 MED ORDER — CEPHALEXIN 500 MG PO CAPS
500.0000 mg | ORAL_CAPSULE | Freq: Three times a day (TID) | ORAL | 0 refills | Status: DC
Start: 2020-08-07 — End: 2020-08-16

## 2020-08-07 NOTE — Telephone Encounter (Signed)
Spoke to pt grand-daughter she stated that the pt has been wondering at time with falls, she hears bells, they did talk to psych they told them to talk to neuro. They are asking for help? Urgent appointment? Or medication to help until she can be seen?

## 2020-08-08 NOTE — Telephone Encounter (Signed)
Spoke to pt granddaughter they will call the psychiatrist [name] back they are wanting to start the Seroquel. She also wanted Dr Karel Jarvis to be aware that palative care will be reaching out to our office soon

## 2020-08-08 NOTE — Telephone Encounter (Signed)
Pls let Brandi Walsh know I reviewed her psychiatrist Gray Bernhardt note, and agree that the options to try would be either Seroquel or Depakote. Psychiatry can start these medications as discussed with her. Either would be an option, understanding there is no cure, they can quiet down symptoms some but would not necessarily take them away because this is part of her brain condition. Thanks

## 2020-08-09 ENCOUNTER — Other Ambulatory Visit: Payer: Self-pay | Admitting: Family

## 2020-08-09 ENCOUNTER — Telehealth: Payer: Self-pay | Admitting: Psychiatry

## 2020-08-09 DIAGNOSIS — F23 Brief psychotic disorder: Secondary | ICD-10-CM

## 2020-08-09 DIAGNOSIS — R413 Other amnesia: Secondary | ICD-10-CM

## 2020-08-09 MED ORDER — QUETIAPINE FUMARATE 25 MG PO TABS: ORAL_TABLET | ORAL | 1 refills | Status: DC

## 2020-08-09 NOTE — Telephone Encounter (Signed)
Granddaughter.called and said that the neurologist said either depokote or seroquel. She would prefer to try the seroquel

## 2020-08-09 NOTE — Telephone Encounter (Signed)
Please review

## 2020-08-09 NOTE — Telephone Encounter (Signed)
Contacted granddaughter, Brandi Walsh, regarding Seroquel. Brandi Walsh reports that pt has seemed calmer the last few days and she may therefore hold off on starting Seroquel and not start Seroquel unless behavior resumes. Discussed risks, benefits, and side effects of Seroquel.   [name] has been going to an in home adult daycare center about 2 weeks ago. Approved for palliative care. Starting OT.   Will send script to pharmacy in the event that s/s recur.

## 2020-08-09 NOTE — Telephone Encounter (Signed)
The wheelchair order is in the system; since we have no paper, I can't print it.

## 2020-08-09 NOTE — Telephone Encounter (Signed)
Okay to give all orders as requested;

## 2020-08-09 NOTE — Telephone Encounter (Signed)
   [name] from [facility] requesting verbal order to extend OT therapy, 1x week for 6  Also requesting order for 16 inch wheelchair w/ desk chair armrest, elevating leg rest, anti tippers, 16 inch roho cushion due to stage 2 decub ulcer  Please call [name] at  [phone], ok to leave detailed message

## 2020-08-12 NOTE — Telephone Encounter (Signed)
Community message sent in Epic today to [employer].

## 2020-08-15 ENCOUNTER — Encounter: Payer: Self-pay | Admitting: Family

## 2020-08-16 ENCOUNTER — Other Ambulatory Visit: Payer: Self-pay | Admitting: Family

## 2020-08-16 ENCOUNTER — Telehealth: Payer: Self-pay | Admitting: Internal Medicine

## 2020-08-16 DIAGNOSIS — Z515 Encounter for palliative care: Secondary | ICD-10-CM

## 2020-08-16 MED ORDER — CEPHALEXIN 500 MG PO CAPS: 500.0000 mg | ORAL_CAPSULE | Freq: Three times a day (TID) | ORAL | 0 refills | Status: DC

## 2020-08-16 NOTE — Telephone Encounter (Signed)
Returned call to grand daughter/POA [name] for updates and to schedule a followup palliatve visit.  She states that decubitus in improving and her appetite has improved as well.  Appointment was scheduled for 08/19/20 11:30am at her adult day care site on [address] In [place].  [name] agreed with this appt.  Margaretha Sheffield, NP-C

## 2020-08-18 ENCOUNTER — Other Ambulatory Visit: Payer: Self-pay | Admitting: Family

## 2020-08-19 ENCOUNTER — Other Ambulatory Visit: Payer: Self-pay

## 2020-08-19 ENCOUNTER — Other Ambulatory Visit: Payer: Medicare Other | Admitting: Internal Medicine

## 2020-08-19 ENCOUNTER — Telehealth: Payer: Self-pay | Admitting: Family

## 2020-08-19 NOTE — Telephone Encounter (Signed)
Brandi Sheffield, NP with [facility] called in regards to update a wound on her left hip and generalize the pt's dementia.

## 2020-08-19 NOTE — Telephone Encounter (Signed)
No contact information was taken in regards to reaching out to the NP so I have no way of reaching her. If she calls back, please try to get return phone call.   If they have not called back in 2-3 days, we can close this encounter.

## 2020-08-19 NOTE — Progress Notes (Incomplete)
Therapist, nutritional Palliative Care Consult Note Telephone: [phone]  Fax: [phone]  PATIENT NAME: Brandi Walsh DOB: 08-24-1946 MRN: [id number]  PRIMARY CARE PROVIDER:   [name], FNP  REFERRING PROVIDER:  [name], FNP [address] [place],  [place] [address]  RESPONSIBLE PARTYElysse, [name] [phone]    [name],[name]   [phone]       RECOMMENDATIONS and PLAN:  Palliative care encounter  Z51.5  1.  Advance care planning:  Explanation of palliative and hospice care to husband and grand-daughter.  Patient is present during conversations.  Goals of care per family's report are to provide quality of care at home and continue adult day care program.  They are also interested in transition to Hospice care for comfort when additional decline occurs.  Advanced directives were reviewed.  Code status is DNR.  Husband and G daughter would like to have time for discussion with additional family members related to completion of the MOST form.  Plan on additional review of delegations during future visits.   2.  Vascular dementia with behavioral disturbances:  Encouraged calm environment. Aromatherapy. Consider use of Seroquel or Depakote for management of aggressive behaviors.    3.    I spent 0 minutes providing this consultation,  from *** to ***. More than 50% of the time in this consultation was spent coordinating communication.   HISTORY OF PRESENT ILLNESS:  Brandi Walsh is a 74 y.o. year old female with multiple medical problems including  Palliative Care was asked to help address goals of care.   CODE STATUS: DNR  PPS: 40%  weak HOSPICE ELIGIBILITY/DIAGNOSIS: TBD  PAST MEDICAL HISTORY:  Past Medical History:  Diagnosis Date  . Alzheimer's disease (HCC)   . COUGH DUE TO ACE INHIBITORS 11/25/2009   Qualifier: Diagnosis of  By: Everardo All MD, Cleophas Dunker   .  Depression   . DIABETES MELLITUS, TYPE II 10/13/2007   Qualifier: Diagnosis of  By: Charlsie Quest RMA, Lucy    . HYPERLIPIDEMIA 10/13/2007   Qualifier: Diagnosis of  By: Tyrone Apple, Lucy    . HYPERTENSION 10/13/2007   Qualifier: Diagnosis of  By: Tyrone Apple, Lucy    . MENOPAUSAL SYNDROME 10/13/2007   Qualifier: Diagnosis of  By: Charlsie Quest RMA, Lucy    . OSTEOPOROSIS 10/13/2007   Qualifier: Diagnosis of  By: Tyrone Apple, Lucy      SOCIAL HX: Lives with husband Social History   Tobacco Use  . Smoking status: Never Smoker  . Smokeless tobacco: Never Used  Substance Use Topics  . Alcohol use: No    ALLERGIES:  Allergies  Allergen Reactions  . Lovastatin     REACTION: Rash     PERTINENT MEDICATIONS:  Outpatient Encounter Medications as of 08/07/2020  Medication Sig  . Cholecalciferol (VITAMIN D) 50 MCG (2000 UT) tablet Take 2,000 Units by mouth daily.  Marland Kitchen donepezil (ARICEPT) 10 MG tablet Take 1 tablet daily  . escitalopram (LEXAPRO) 10 MG tablet Take 1.5 tablets (15 mg total) by mouth daily.  Marland Kitchen losartan (COZAAR) 50 MG tablet Take 1 tablet (50 mg total) by mouth daily.  . mupirocin ointment (BACTROBAN) 2 % Apply topically 3 (three) times daily.  Marland Kitchen omeprazole (PRILOSEC) 20 MG capsule Take 1 capsule (20 mg total) by mouth daily.  . [DISCONTINUED] cephALEXin (KEFLEX) 500 MG capsule Take 1 capsule (500 mg total) by mouth 3 (three) times daily.  . [DISCONTINUED] rosuvastatin (  CRESTOR) 40 MG tablet Take 1 tablet (40 mg total) by mouth daily.   No facility-administered encounter medications on file as of 08/07/2020.    PHYSICAL EXAM:   General: NAD, very frail appearing, thin Cardiovascular: regular rate and rhythm Pulmonary: clear ant fields Abdomen: soft, nontender, + bowel sounds GU: no suprapubic tenderness Extremities: no edema, decreased muscle mass. Very stiff Skin: 2cm stage 2 decubitus at the L hip over prominent trochanter region. Scant serous discharge, erythema centrally. No increased  warmth Neurological: Somnolent with intermittent alertness. Oriented to self and family members.Speaks in 3-4 word sentences. Follows instructions  Weakness  Margaretha Sheffield, NP-C

## 2020-08-20 ENCOUNTER — Telehealth: Payer: Medicare Other | Admitting: Psychiatry

## 2020-08-20 ENCOUNTER — Encounter: Payer: Self-pay | Admitting: Family

## 2020-08-20 MED ORDER — SULFAMETHOXAZOLE-TRIMETHOPRIM 800-160 MG PO TABS: 1.0000 | ORAL_TABLET | Freq: Two times a day (BID) | ORAL | 0 refills | Status: DC

## 2020-08-20 NOTE — Telephone Encounter (Signed)
I have reached out to family member to get clarification on what type of nursing care ( specifically wound care) is being done for the patient.  Will d/c Keflex and changed to Bactrim; may also need to set up wound care nurse to come to the home.

## 2020-08-20 NOTE — Telephone Encounter (Signed)
Margaretha Sheffield, NP [phone].

## 2020-08-20 NOTE — Progress Notes (Deleted)
Brandi ROSEMAN [id number] [birth year] 74 y.o.  Virtual Visit via Video Note  I connected with pt @ on 08/20/20 at 10:30 AM EDT by a video enabled telemedicine application and verified that I am speaking with the correct person using two identifiers.   I discussed the limitations of evaluation and management by telemedicine and the availability of in person appointments. The patient expressed understanding and agreed to proceed.  I discussed the assessment and treatment plan with the patient. The patient was provided an opportunity to ask questions and all were answered. The patient agreed with the plan and demonstrated an understanding of the instructions.   The patient was advised to call back or seek an in-person evaluation if the symptoms worsen or if the condition fails to improve as anticipated.  I provided *** minutes of non-face-to-face time during this encounter.  The patient was located at home.  The provider was located at [facility].   [name], PMHNP   Subjective:   Patient ID:  Brandi Walsh is a 74 y.o. (DOB [birth year]) female.  Chief Complaint: No chief complaint on file.   HPI [name] presents for follow-up of ***   Past Psychiatric Medication Trials: Lexapro Cymbalta Risperdal Olanzapine Remeron- Vivid dreams, nightmares  Review of Systems:  Review of Systems  Medications: {medication reviewed/display:3041432}  Current Outpatient Medications  Medication Sig Dispense Refill  . cephALEXin (KEFLEX) 500 MG capsule Take 1 capsule (500 mg total) by mouth 3 (three) times daily. 15 capsule 0  . Cholecalciferol (VITAMIN D) 50 MCG (2000 UT) tablet Take 2,000 Units by mouth daily.    Marland Kitchen donepezil (ARICEPT) 10 MG tablet Take 1 tablet daily 90 tablet 3  . escitalopram (LEXAPRO) 10 MG tablet Take 1.5 tablets (15 mg total) by mouth daily. 45 tablet 1  . losartan (COZAAR) 50 MG tablet Take 1 tablet (50 mg total) by mouth daily. 90 tablet 1  .  mupirocin ointment (BACTROBAN) 2 % Apply topically 3 (three) times daily. 22 g 0  . omeprazole (PRILOSEC) 20 MG capsule Take 1 capsule (20 mg total) by mouth daily. 90 capsule 5  . QUEtiapine (SEROQUEL) 25 MG tablet Take 1-2 tabs po QHS 60 tablet 1  . rosuvastatin (CRESTOR) 40 MG tablet TAKE 1 TABLET BY MOUTH EVERY DAY 90 tablet 1   No current facility-administered medications for this visit.    Medication Side Effects: {Medication Side Effects (Optional):21014029}  Allergies:  Allergies  Allergen Reactions  . Lovastatin     REACTION: Rash    Past Medical History:  Diagnosis Date  . Alzheimer's disease (HCC)   . COUGH DUE TO ACE INHIBITORS 11/25/2009   Qualifier: Diagnosis of  By: Everardo All MD, Cleophas Dunker   . Depression   . DIABETES MELLITUS, TYPE II 10/13/2007   Qualifier: Diagnosis of  By: Charlsie Quest RMA, Lucy    . HYPERLIPIDEMIA 10/13/2007   Qualifier: Diagnosis of  By: Tyrone Apple, Lucy    . HYPERTENSION 10/13/2007   Qualifier: Diagnosis of  By: Tyrone Apple, Lucy    . MENOPAUSAL SYNDROME 10/13/2007   Qualifier: Diagnosis of  By: Charlsie Quest RMA, Lucy    . OSTEOPOROSIS 10/13/2007   Qualifier: Diagnosis of  By: Samara Snide      Family History  Problem Relation Age of Onset  . Anxiety disorder Mother   . Hyperparathyroidism Sister   . Cancer Neg Hx   . Breast cancer Neg Hx     Social History   Socioeconomic History  .  Marital status: Married    Spouse name: Not on file  . Number of children: Not on file  . Years of education: Not on file  . Highest education level: Not on file  Occupational History  . Occupation: Retired   Tobacco Use  . Smoking status: Never Smoker  . Smokeless tobacco: Never Used  Vaping Use  . Vaping Use: Never used  Substance and Sexual Activity  . Alcohol use: No  . Drug use: No  . Sexual activity: Yes  Other Topics Concern  . Not on file  Social History Narrative   Left Handed   Two Story Home   Lives with Husband    Drinks coffee and Diet  [employer] sometimes   Social Determinants of Health   Financial Resource Strain:   . Difficulty of Paying Living Expenses: Not on file  Food Insecurity:   . Worried About Programme researcher, broadcasting/film/video in the Last Year: Not on file  . Ran Out of Food in the Last Year: Not on file  Transportation Needs:   . Lack of Transportation (Medical): Not on file  . Lack of Transportation (Non-Medical): Not on file  Physical Activity:   . Days of Exercise per Week: Not on file  . Minutes of Exercise per Session: Not on file  Stress:   . Feeling of Stress : Not on file  Social Connections:   . Frequency of Communication with Friends and Family: Not on file  . Frequency of Social Gatherings with Friends and Family: Not on file  . Attends Religious Services: Not on file  . Active Member of Clubs or Organizations: Not on file  . Attends Banker Meetings: Not on file  . Marital Status: Not on file  Intimate Partner Violence:   . Fear of Current or Ex-Partner: Not on file  . Emotionally Abused: Not on file  . Physically Abused: Not on file  . Sexually Abused: Not on file    Past Medical History, Surgical history, Social history, and Family history were reviewed and updated as appropriate.   Please see review of systems for further details on the patient's review from today.   Objective:   Physical Exam:  There were no vitals taken for this visit.  Physical Exam  Lab Review:     Component Value Date/Time   NA 138 08/02/2020 1458   K 3.8 08/02/2020 1458   CL 102 08/02/2020 1458   CO2 28 08/02/2020 1458   GLUCOSE 135 (H) 08/02/2020 1458   BUN 14 08/02/2020 1458   CREATININE 0.73 08/02/2020 1458   CALCIUM 10.7 (H) 08/02/2020 1458   CALCIUM 10.8 (H) 02/19/2012 1129   PROT 5.9 (L) 08/02/2020 1458   ALBUMIN 3.6 07/03/2020 1732   AST 38 (H) 08/02/2020 1458   ALT 60 (H) 08/02/2020 1458   ALKPHOS 70 07/03/2020 1732   BILITOT 1.5 (H) 08/02/2020 1458   GFRNONAA >60 07/03/2020 1732    GFRAA >60 07/03/2020 1732       Component Value Date/Time   WBC 10.4 07/23/2020 1010   RBC 4.66 07/23/2020 1010   HGB 14.9 07/23/2020 1010   HGB 15.3 12/16/2010 1045   HCT 43.4 07/23/2020 1010   HCT 43.8 12/16/2010 1045   PLT 246 07/23/2020 1010   PLT 224 12/16/2010 1045   MCV 93.1 07/23/2020 1010   MCV 88.9 12/16/2010 1045   MCH 32.0 07/23/2020 1010   MCHC 34.3 07/23/2020 1010   RDW 13.3 07/23/2020 1010  RDW 12.5 12/16/2010 1045   LYMPHSABS 1,435 07/23/2020 1010   LYMPHSABS 2.1 12/16/2010 1045   MONOABS 0.7 07/03/2020 1732   MONOABS 0.8 12/16/2010 1045   EOSABS 73 07/23/2020 1010   EOSABS 0.3 12/16/2010 1045   BASOSABS 42 07/23/2020 1010   BASOSABS 0.1 12/16/2010 1045    No results found for: POCLITH, LITHIUM   No results found for: PHENYTOIN, PHENOBARB, VALPROATE, CBMZ   .res Assessment: Plan:    There are no diagnoses linked to this encounter.   Please see After Visit Summary for patient specific instructions.  Future Appointments  Date Time Provider Department Center  01/27/2021  8:30 AM Van Clines, MD [facility] None    No orders of the defined types were placed in this encounter.     -------------------------------

## 2020-08-21 ENCOUNTER — Other Ambulatory Visit: Payer: Self-pay | Admitting: Family

## 2020-08-21 NOTE — Telephone Encounter (Signed)
Patient's grand-daughter responded to my chart message.

## 2020-08-21 NOTE — Telephone Encounter (Signed)
  There has not been a response from family member to [employer] message sent yesterday.  Please call and ask them about message below. I need to know who is managing the wound care for the patient.    I am concerned and confused about your grandmother's wound. Do you have a home health nurse looking at this/ doing any type of treatment?  The palliative care nurse just told me she didn't think the antibiotics were working but offered very little else.  I am going to send in a different antibiotic but I think we need to have a wound care specialist look at the wound. Before we get this set up, I just wanted to verify that it is not already being done.

## 2020-08-22 ENCOUNTER — Other Ambulatory Visit: Payer: Self-pay | Admitting: Family

## 2020-08-22 ENCOUNTER — Ambulatory Visit: Payer: Medicare Other | Admitting: Neurology

## 2020-08-22 MED ORDER — MUPIROCIN 2 % EX OINT: TOPICAL_OINTMENT | Freq: Three times a day (TID) | CUTANEOUS | 1 refills | Status: DC

## 2020-08-23 ENCOUNTER — Encounter: Payer: Self-pay | Admitting: Family

## 2020-08-26 ENCOUNTER — Encounter: Payer: Self-pay | Admitting: Family

## 2020-08-26 NOTE — Progress Notes (Incomplete)
    Therapist, nutritional Palliative Care Consult Note Telephone: [phone]  Fax: [phone]  PATIENT NAME: Brandi Walsh DOB: 1946-06-22 MRN: [id number]  PRIMARY CARE PROVIDER:   [name], FNP  REFERRING PROVIDER:  [name], FNP [address] [place],  [place] [address]  RESPONSIBLE PARTY:     ASSESSMENT:        RECOMMENDATIONS and PLAN:  1.  I spent *** minutes providing this consultation,  from *** to ***. More than 50% of the time in this consultation was spent coordinating communication.   HISTORY OF PRESENT ILLNESS:  TECLA MAILLOUX is a 74 y.o. year old female with multiple medical problems including ***. Palliative Care was asked to help address goals of care.   CODE STATUS:   PPS: 0% HOSPICE ELIGIBILITY/DIAGNOSIS: TBD  PAST MEDICAL HISTORY:  Past Medical History:  Diagnosis Date  . Alzheimer's disease (HCC)   . COUGH DUE TO ACE INHIBITORS 11/25/2009   Qualifier: Diagnosis of  By: Everardo All MD, Cleophas Dunker   . Depression   . DIABETES MELLITUS, TYPE II 10/13/2007   Qualifier: Diagnosis of  By: Charlsie Quest RMA, Lucy    . HYPERLIPIDEMIA 10/13/2007   Qualifier: Diagnosis of  By: Tyrone Apple, Lucy    . HYPERTENSION 10/13/2007   Qualifier: Diagnosis of  By: Tyrone Apple, Lucy    . MENOPAUSAL SYNDROME 10/13/2007   Qualifier: Diagnosis of  By: Charlsie Quest RMA, Lucy    . OSTEOPOROSIS 10/13/2007   Qualifier: Diagnosis of  By: Tyrone Apple, Lucy      SOCIAL HX:  Social History   Tobacco Use  . Smoking status: Never Smoker  . Smokeless tobacco: Never Used  Substance Use Topics  . Alcohol use: No    ALLERGIES:  Allergies  Allergen Reactions  . Lovastatin     REACTION: Rash     PERTINENT MEDICATIONS:  Outpatient Encounter Medications as of 08/19/2020  Medication Sig  . Cholecalciferol (VITAMIN D) 50 MCG (2000 UT) tablet Take 2,000 Units by mouth daily.  Marland Kitchen donepezil (ARICEPT) 10 MG tablet Take 1 tablet daily  . escitalopram  (LEXAPRO) 10 MG tablet Take 1.5 tablets (15 mg total) by mouth daily.  Marland Kitchen losartan (COZAAR) 50 MG tablet Take 1 tablet (50 mg total) by mouth daily.  Marland Kitchen omeprazole (PRILOSEC) 20 MG capsule Take 1 capsule (20 mg total) by mouth daily.  . QUEtiapine (SEROQUEL) 25 MG tablet Take 1-2 tabs po QHS  . rosuvastatin (CRESTOR) 40 MG tablet TAKE 1 TABLET BY MOUTH EVERY DAY  . sulfamethoxazole-trimethoprim (BACTRIM DS) 800-160 MG tablet Take 1 tablet by mouth 2 (two) times daily.  . [DISCONTINUED] cephALEXin (KEFLEX) 500 MG capsule Take 1 capsule (500 mg total) by mouth 3 (three) times daily.  . [DISCONTINUED] mupirocin ointment (BACTROBAN) 2 % Apply topically 3 (three) times daily.   No facility-administered encounter medications on file as of 08/19/2020.    PHYSICAL EXAM:   General: NAD, frail appearing, thin Cardiovascular: regular rate and rhythm Pulmonary: clear ant fields Abdomen: soft, nontender, + bowel sounds GU: no suprapubic tenderness Extremities: no edema, no joint deformities Skin: no rashes Neurological: Weakness but otherwise nonfocal  Margaretha Sheffield, NP-C

## 2020-08-28 ENCOUNTER — Encounter: Payer: Self-pay | Admitting: Family

## 2020-08-30 ENCOUNTER — Other Ambulatory Visit: Payer: Self-pay | Admitting: Family

## 2020-08-30 ENCOUNTER — Encounter: Payer: Self-pay | Admitting: Family

## 2020-08-30 MED ORDER — SULFAMETHOXAZOLE-TRIMETHOPRIM 800-160 MG PO TABS: 1.0000 | ORAL_TABLET | Freq: Two times a day (BID) | ORAL | 0 refills | Status: DC

## 2020-09-03 ENCOUNTER — Other Ambulatory Visit: Payer: Medicare Other | Admitting: Internal Medicine

## 2020-09-03 ENCOUNTER — Other Ambulatory Visit: Payer: Self-pay

## 2020-09-03 DIAGNOSIS — F01518 Vascular dementia, unspecified severity, with other behavioral disturbance: Secondary | ICD-10-CM

## 2020-09-03 DIAGNOSIS — Z515 Encounter for palliative care: Secondary | ICD-10-CM

## 2020-09-03 DIAGNOSIS — F0151 Vascular dementia with behavioral disturbance: Secondary | ICD-10-CM

## 2020-09-03 NOTE — Progress Notes (Signed)
Therapist, nutritional Palliative Care Consult Note Telephone: [phone]  Fax: [phone]  PATIENT NAME: Brandi Walsh DOB: Aug 07, 1946 MRN: [id number]  PRIMARY CARE PROVIDER:   [name], FNP  REFERRING PROVIDER:  [name], FNP [address] [place],  [place] [address]  RESPONSIBLE PARTYMaddisyn, [place] [phone]    Haven,[name]   [phone]     RECOMMENDATIONS and PLAN:  Palliative care encounter  Z51.5  1.  Advance care planning:  Goals of care per family's report remain unchanged and include quality of care at home and continue adult day care program.  They are also interested in transition to Hospice care for comfort when additional decline occurs.  Advanced directives are as follows: Code status is DNR. Readdress MOST form delegations during future palliative visit.  Palliative care will continue to followup with patient at her day care center in aprox 4-6 weeks.     2.  Vascular dementia with behavioral disturbances:  At baseline.  FAST stage is 7C.  Aromatherapy. Initiate rx of Seroquel(family has Rx) if pt becomes agitated consistently with failure to redirect.  Monitor for cognitive, functional changes and continued weight loss.     3. L hip decubitus:  Greatly improved since change of antibiotic to Septra.  Continue use of Bactroban on wound and cover with a dfy  I spent 35 minutes providing this consultation,  From 1000  to 1035. More than 50% of the time in this consultation was spent coordinating communication with patient, caregiver and grand daughter.   HISTORY OF PRESENT ILLNESS:  Follow-up with Brandi Walsh,  a 74 y.o. year old female with multiple medical problems including vascular dementia with behaviors.  Caregiver states that patient eats well when at adult daycare and naps often. No reported adverse behaviors or falls within the past several weeks.  She remains dependent on caregivers and family for completion of all ADLs.  No reports of additional wounds other than the L lateral hip. She completed an alternate antibiotic Palliative Care was asked to help address goals of care.   CODE STATUS: DNR  PPS: 40%  weak HOSPICE ELIGIBILITY/DIAGNOSIS: TBD  PAST MEDICAL HISTORY:  Past Medical History:  Diagnosis Date  . Alzheimer's disease (HCC)   . COUGH DUE TO ACE INHIBITORS 11/25/2009   Qualifier: Diagnosis of  By: Brandi Walsh, Brandi Walsh   . Depression   . DIABETES MELLITUS, TYPE II 10/13/2007   Qualifier: Diagnosis of  By: Brandi Walsh, Brandi Walsh    . HYPERLIPIDEMIA 10/13/2007   Qualifier: Diagnosis of  By: Brandi Walsh, Brandi Walsh    . HYPERTENSION 10/13/2007   Qualifier: Diagnosis of  By: Brandi Walsh, Brandi Walsh    . MENOPAUSAL SYNDROME 10/13/2007   Qualifier: Diagnosis of  By: Brandi Walsh, Brandi Walsh    . OSTEOPOROSIS 10/13/2007   Qualifier: Diagnosis of  By: Brandi Walsh, Brandi Walsh      SOCIAL HX: Lives with husband Social History    PERTINENT MEDICATIONS:  Outpatient Encounter Medications as of 08/07/2020  Medication Sig  . Cholecalciferol (VITAMIN D) 50 MCG (2000 UT) tablet Take 2,000 Units by mouth daily.  Marland Kitchen donepezil (ARICEPT) 10 MG tablet Take 1 tablet daily  . escitalopram (LEXAPRO) 10 MG tablet Take 1.5 tablets (15 mg total) by mouth daily.  Marland Kitchen losartan (COZAAR) 50 MG tablet Take 1 tablet (50 mg total) by mouth daily.  . mupirocin oiment (BACTROBAN) 2 % Apply topically 3 (three) times  daily.  . omeprazole (PRILOSEC) 20 MG capsule Take 1 capsule (20 mg total) by mouth daily.  . [DISCONTINUED] cephALEXin (KEFLEX) 500 MG capsule Take 1 capsule (500 mg total) by mouth 3 (three) times daily.  . [DISCONTINUED] rosuvastatin (CRESTOR) 40 MG tablet Take 1 tablet (40 mg total) by mouth daily.       PHYSICAL EXAM:   General: NAD, very frail appearing, thin Cardiovascular: regular rate and rhythm Pulmonary: clear ant fields Abdomen: soft, nontender, + bowel  sounds GU: no suprapubic tenderness Extremities: no edema, decreased muscle mass. Very stiff Skin: 1cm stage 2 decubitus at the L hip over prominent trochanter region. Scant serous discharge,no erythema or tenderness. No increased warmth Neurological: Alert and unable to determine orientation due to cognitive deficit. Speaks in 3-4 word sentences. Follows instructions  Weakness  Margaretha Sheffield, NP-C

## 2020-09-05 ENCOUNTER — Other Ambulatory Visit: Payer: Self-pay

## 2020-09-05 ENCOUNTER — Encounter: Payer: Self-pay | Admitting: Psychiatry

## 2020-09-05 ENCOUNTER — Ambulatory Visit (INDEPENDENT_AMBULATORY_CARE_PROVIDER_SITE_OTHER): Payer: Medicare Other | Admitting: Psychiatry

## 2020-09-05 DIAGNOSIS — F419 Anxiety disorder, unspecified: Secondary | ICD-10-CM

## 2020-09-05 DIAGNOSIS — F324 Major depressive disorder, single episode, in partial remission: Secondary | ICD-10-CM

## 2020-09-05 MED ORDER — ESCITALOPRAM OXALATE 10 MG PO TABS: 15.0000 mg | ORAL_TABLET | Freq: Every day | ORAL | 3 refills | Status: DC

## 2020-09-05 NOTE — Progress Notes (Signed)
Brandi Walsh [id number] [birth year] 74 y.o.  Subjective:   Patient ID:  Brandi Walsh is a 74 y.o. (DOB 09/17/1946) female.  Chief Complaint:  Chief Complaint  Patient presents with  . Follow-up    Anixety, recent psychosis, h/o depression  . Memory Loss    HPI Brandi Walsh presents to the office today for follow-up of mood, anxiety, and insomnia. Continues to lose weight. Granddaughter reports that swelling has been less. Brandi Walsh reports that they have not needed to start Seroquel since she has not had any more behavioral disturbances or sleeplessness. Brandi Walsh denies any delusional thoughts.   Brandi Walsh reports that pt's behaviors and delusions seemed to resolve around the time she was treated with an antibiotic. Granddaughter reports that they have reached a new normal and that things seem to be more stable.   She reports some sadness. She reports feeling somewhat nervous. She reports that her sleep has been fair. Granddaughter reports that pt seems to be resting throughout the night. She describes her appetite as "wonderful." Brandi Walsh reports that pt has been eating well. Brandi Walsh reports that pt's anxiety has been well controlled. Brandi Walsh reports that pt's mood will change based on what is going on and what is being asked of her. Brandi Walsh denies any worsening depression. Brandi Walsh reports that pt occasionally will think that she is not at home in the evenings and this is re-directable and does not cause distress. Energy has improved and has been wanting to walk more. No overt suicidal thoughts and behavior.   Pt has been going to adult sitter during the day. Brandi Walsh reports that pt has breakfast and lunch there in addition to snacks. She typically takes a nap in the morning and then is ready for breakfast after her nap.   Palliative care visiting every 4 weeks.   Past Psychiatric Medication Trials: Lexapro Cymbalta Risperdal Olanzapine Remeron- Vivid dreams, nightmares    AIMS      Office Visit from 05/06/2020 in [facility] Office Visit from 08/08/2019 in [facility]  AIMS Total Score 6 1    PHQ2-9     Office Visit from 09/19/2019 in [facility]  PHQ-2 Total Score 0       Review of Systems:  Review of Systems  Musculoskeletal:       Slowed gait. Gait has improved some.   Skin:       Has had some bed sores that are healing.   Neurological: Negative for tremors.  Psychiatric/Behavioral:       Please refer to HPI    Medications: I have reviewed the patient's current medications.  Current Outpatient Medications  Medication Sig Dispense Refill  . Cholecalciferol (VITAMIN D) 50 MCG (2000 UT) tablet Take 2,000 Units by mouth daily.    Marland Kitchen donepezil (ARICEPT) 10 MG tablet Take 1 tablet daily 90 tablet 3  . escitalopram (LEXAPRO) 10 MG tablet Take 1.5 tablets (15 mg total) by mouth daily. 45 tablet 3  . losartan (COZAAR) 50 MG tablet Take 1 tablet (50 mg total) by mouth daily. 90 tablet 1  . omeprazole (PRILOSEC) 20 MG capsule Take 1 capsule (20 mg total) by mouth daily. 90 capsule 5  . rosuvastatin (CRESTOR) 40 MG tablet TAKE 1 TABLET BY MOUTH EVERY DAY 90 tablet 1  . sulfamethoxazole-trimethoprim (BACTRIM DS) 800-160 MG tablet Take 1 tablet by mouth 2 (two) times daily. 14 tablet 0  . mupirocin ointment (BACTROBAN) 2 % Apply topically 3 (three) times  daily. 30 g 1   No current facility-administered medications for this visit.    Medication Side Effects: None  Allergies:  Allergies  Allergen Reactions  . Lovastatin     REACTION: Rash    Past Medical History:  Diagnosis Date  . Alzheimer's disease (HCC)   . COUGH DUE TO ACE INHIBITORS 11/25/2009   Qualifier: Diagnosis of  By: Everardo All MD, Cleophas Dunker   . Depression   . DIABETES MELLITUS, TYPE II 10/13/2007   Qualifier: Diagnosis of  By: Charlsie Quest RMA, Lucy    . HYPERLIPIDEMIA 10/13/2007   Qualifier: Diagnosis of  By: Tyrone Apple, Lucy    . HYPERTENSION  10/13/2007   Qualifier: Diagnosis of  By: Tyrone Apple, Lucy    . MENOPAUSAL SYNDROME 10/13/2007   Qualifier: Diagnosis of  By: Charlsie Quest RMA, Lucy    . OSTEOPOROSIS 10/13/2007   Qualifier: Diagnosis of  By: Samara Snide      Family History  Problem Relation Age of Onset  . Anxiety disorder Mother   . Hyperparathyroidism Sister   . Cancer Neg Hx   . Breast cancer Neg Hx     Social History   Socioeconomic History  . Marital status: Married    Spouse name: Not on file  . Number of children: Not on file  . Years of education: Not on file  . Highest education level: Not on file  Occupational History  . Occupation: Retired   Tobacco Use  . Smoking status: Never Smoker  . Smokeless tobacco: Never Used  Vaping Use  . Vaping Use: Never used  Substance and Sexual Activity  . Alcohol use: No  . Drug use: No  . Sexual activity: Yes  Other Topics Concern  . Not on file  Social History Narrative   Left Handed   Two Story Home   Lives with Husband    Drinks coffee and [employer] sometimes   Social Determinants of Health   Financial Resource Strain:   . Difficulty of Paying Living Expenses: Not on file  Food Insecurity:   . Worried About Programme researcher, broadcasting/film/video in the Last Year: Not on file  . Ran Out of Food in the Last Year: Not on file  Transportation Needs:   . Lack of Transportation (Medical): Not on file  . Lack of Transportation (Non-Medical): Not on file  Physical Activity:   . Days of Exercise per Week: Not on file  . Minutes of Exercise per Session: Not on file  Stress:   . Feeling of Stress : Not on file  Social Connections:   . Frequency of Communication with Friends and Family: Not on file  . Frequency of Social Gatherings with Friends and Family: Not on file  . Attends Religious Services: Not on file  . Active Member of Clubs or Organizations: Not on file  . Attends Banker Meetings: Not on file  . Marital Status: Not on file  Intimate Partner  Violence:   . Fear of Current or Ex-Partner: Not on file  . Emotionally Abused: Not on file  . Physically Abused: Not on file  . Sexually Abused: Not on file    Past Medical History, Surgical history, Social history, and Family history were reviewed and updated as appropriate.   Please see review of systems for further details on the patient's review from today.   Objective:   Physical Exam:  Wt 93 lb (42.2 kg)   BMI 17.01 kg/m   Physical Exam  Constitutional:      General: She is not in acute distress. Musculoskeletal:        General: No deformity.  Neurological:     Mental Status: She is alert and oriented to person, place, and time.     Coordination: Coordination normal.  Psychiatric:        Attention and Perception: Attention and perception normal. She does not perceive auditory or visual hallucinations.        Mood and Affect: Mood normal. Mood is not anxious or depressed. Affect is not labile, blunt, angry or inappropriate.        Speech: Speech normal.        Behavior: Behavior is not agitated. Behavior is cooperative.        Thought Content: Thought content normal. Thought content is not paranoid or delusional. Thought content does not include homicidal or suicidal ideation. Thought content does not include homicidal or suicidal plan.        Cognition and Memory: Cognition is impaired. She exhibits impaired recent memory.        Judgment: Judgment normal.     Comments: Insight fair Pt more engaged compared to last exam     Lab Review:     Component Value Date/Time   NA 138 08/02/2020 1458   K 3.8 08/02/2020 1458   CL 102 08/02/2020 1458   CO2 28 08/02/2020 1458   GLUCOSE 135 (H) 08/02/2020 1458   BUN 14 08/02/2020 1458   CREATININE 0.73 08/02/2020 1458   CALCIUM 10.7 (H) 08/02/2020 1458   CALCIUM 10.8 (H) 02/19/2012 1129   PROT 5.9 (L) 08/02/2020 1458   ALBUMIN 3.6 07/03/2020 1732   AST 38 (H) 08/02/2020 1458   ALT 60 (H) 08/02/2020 1458   ALKPHOS 70  07/03/2020 1732   BILITOT 1.5 (H) 08/02/2020 1458   GFRNONAA >60 07/03/2020 1732   GFRAA >60 07/03/2020 1732       Component Value Date/Time   WBC 10.4 07/23/2020 1010   RBC 4.66 07/23/2020 1010   HGB 14.9 07/23/2020 1010   HGB 15.3 12/16/2010 1045   HCT 43.4 07/23/2020 1010   HCT 43.8 12/16/2010 1045   PLT 246 07/23/2020 1010   PLT 224 12/16/2010 1045   MCV 93.1 07/23/2020 1010   MCV 88.9 12/16/2010 1045   MCH 32.0 07/23/2020 1010   MCHC 34.3 07/23/2020 1010   RDW 13.3 07/23/2020 1010   RDW 12.5 12/16/2010 1045   LYMPHSABS 1,435 07/23/2020 1010   LYMPHSABS 2.1 12/16/2010 1045   MONOABS 0.7 07/03/2020 1732   MONOABS 0.8 12/16/2010 1045   EOSABS 73 07/23/2020 1010   EOSABS 0.3 12/16/2010 1045   BASOSABS 42 07/23/2020 1010   BASOSABS 0.1 12/16/2010 1045    No results found for: POCLITH, LITHIUM   No results found for: PHENYTOIN, PHENOBARB, VALPROATE, CBMZ   .res Assessment: Plan:   Will continue Lexapro 15 mg po qd for depression and anxiety since family notices improved mood and anxiety with increased dose.  Will discontinue Seroquel since Seroquel was not needed due to resolution of psychosis and behavioral disturbances with initiation of antibiotic.  Pt to follow-up in 3 months or sooner if clinically indicated.  Patient advised to contact office with any questions, adverse effects, or acute worsening in signs and symptoms.  Brandi Walsh was seen today for follow-up and memory loss.  Diagnoses and all orders for this visit:  Major depressive disorder in partial remission, unspecified whether recurrent (HCC) -     escitalopram (  LEXAPRO) 10 MG tablet; Take 1.5 tablets (15 mg total) by mouth daily.  Anxiety disorder, unspecified type -     escitalopram (LEXAPRO) 10 MG tablet; Take 1.5 tablets (15 mg total) by mouth daily.     Please see After Visit Summary for patient specific instructions.  Future Appointments  Date Time Provider Department Center  12/06/2020 11:00  AM [name], PMHNP [facility] None  01/27/2021  8:30 AM Van Clines, MD [facility] None    No orders of the defined types were placed in this encounter.   -------------------------------

## 2020-09-10 ENCOUNTER — Other Ambulatory Visit: Payer: Self-pay | Admitting: Gastroenterology

## 2020-09-10 DIAGNOSIS — R634 Abnormal weight loss: Secondary | ICD-10-CM

## 2020-09-13 ENCOUNTER — Other Ambulatory Visit: Payer: Self-pay

## 2020-09-13 ENCOUNTER — Encounter: Payer: Self-pay | Admitting: Family

## 2020-09-13 ENCOUNTER — Ambulatory Visit (INDEPENDENT_AMBULATORY_CARE_PROVIDER_SITE_OTHER): Payer: Medicare Other | Admitting: Family

## 2020-09-13 VITALS — BP 106/62 | HR 75 | Temp 98.1°F | Ht 62.0 in | Wt 101.0 lb

## 2020-09-13 DIAGNOSIS — F0391 Unspecified dementia with behavioral disturbance: Secondary | ICD-10-CM

## 2020-09-13 NOTE — Progress Notes (Signed)
Brandi Walsh is a 74 y.o. female with the following history as recorded in Epic[employer]:  Patient Active Problem List   Diagnosis Date Noted  . 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  . Abnormal liver enzymes 09/19/2019  . Blurred vision 09/19/2019  . Hypercalcemia 03/15/2019  . Major depression in full remission (HCC) 11/24/2018  . Memory loss 12/24/2016  . Encounter for well adult exam with abnormal findings 06/13/2013  . Hyperparathyroidism, primary (HCC) 06/12/2013  . GERD (gastroesophageal reflux disease) 02/19/2012  . 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  . COUGH DUE TO ACE INHIBITORS 11/25/2009  . Diabetes (HCC) 10/13/2007  . Dyslipidemia 10/13/2007  . Essential hypertension 10/13/2007  . MENOPAUSAL SYNDROME 10/13/2007  . Osteoporosis 10/13/2007    Current Outpatient Medications  Medication Sig Dispense Refill  . Cholecalciferol (VITAMIN D) 50 MCG (2000 UT) tablet Take 2,000 Units by mouth daily.    Marland Kitchen donepezil (ARICEPT) 10 MG tablet Take 1 tablet daily 90 tablet 3  . escitalopram (LEXAPRO) 10 MG tablet Take 1.5 tablets (15 mg total) by mouth daily. 45 tablet 3  . losartan (COZAAR) 50 MG tablet Take 1 tablet (50 mg total) by mouth daily. 90 tablet 1  . mupirocin ointment (BACTROBAN) 2 % Apply topically 3 (three) times daily. 30 g 1  . omeprazole (PRILOSEC) 20 MG capsule Take 1 capsule (20 mg total) by mouth daily. 90 capsule 5  . rosuvastatin (CRESTOR) 40 MG tablet TAKE 1 TABLET BY MOUTH EVERY DAY 90 tablet 1   No current facility-administered medications for this visit.    Allergies: Lovastatin  Past Medical History:  Diagnosis Date  . Alzheimer's disease (HCC)   . COUGH DUE TO ACE INHIBITORS 11/25/2009   Qualifier: Diagnosis of  By: Everardo All MD, Cleophas Dunker   . Depression   . DIABETES MELLITUS, TYPE II 10/13/2007   Qualifier: Diagnosis of  By: Charlsie Quest RMA, Lucy    . HYPERLIPIDEMIA 10/13/2007   Qualifier: Diagnosis of  By: Tyrone Apple, Lucy    . HYPERTENSION 10/13/2007   Qualifier: Diagnosis of  By:  Tyrone Apple, Lucy    . MENOPAUSAL SYNDROME 10/13/2007   Qualifier: Diagnosis of  By: Charlsie Quest RMA, Lucy    . OSTEOPOROSIS 10/13/2007   Qualifier: Diagnosis of  By: Samara Snide      Past Surgical History:  Procedure Laterality Date  . ABDOMINAL HYSTERECTOMY    . BREAST BIOPSY  08/2002  . TONSILLECTOMY      Family History  Problem Relation Age of Onset  . Anxiety disorder Mother   . Hyperparathyroidism Sister   . Cancer Neg Hx   . Breast cancer Neg Hx     Social History   Tobacco Use  . Smoking status: Never Smoker  . Smokeless tobacco: Never Used  Substance Use Topics  . Alcohol use: No    Subjective:  Brought to the office by her granddaughter; patient does not participate in visit; known history of dementia; There was concern about weight loss but family feels that patient is actually eating very well/ symptoms have stabilized recently;  Family has not decided about flu shot or COVID booster for patient;   Objective:  Vitals:   09/13/20 1056  BP: 106/62  Pulse: 75  Temp: 98.1 F (36.7 C)  TempSrc: Oral  SpO2: 99%  Weight: 101 lb (45.8 kg)  Height: 5\' 2"  (1.575 m)    General: Well developed, well nourished, in no acute distress  Skin : Warm and dry.  Head: Normocephalic and atraumatic  Eyes: Sclera and  conjunctiva clear; pupils round and reactive to light; extraocular movements intact  Ears: External normal; canals clear; tympanic membranes normal  Oropharynx: Pink, supple. No suspicious lesions  Neck: Supple without thyromegaly, adenopathy  Lungs: Respirations unlabored; clear to auscultation bilaterally without wheeze, rales, rhonchi  CVS exam: normal rate and regular rhythm.  Neurologic- not oriented; walks with assistance;   Assessment:  1. Dementia with behavioral disturbance, unspecified dementia type (HCC)     Plan:  Weight is relatively stable today- was at 103 last month; family will continue to encourage caloric intake; home health is seeing patient  weekly/ palliative care every 4-6 weeks;  No specific follow up scheduled at this time; Family will decide about flu shot and COVID booster;  Time spent 30 minutes discussing family's concerns/ expectations;   This visit occurred during the SARS-CoV-2 public health emergency.  Safety protocols were in place, including screening questions prior to the visit, additional usage of staff PPE, and extensive cleaning of exam room while observing appropriate contact time as indicated for disinfecting solutions.     No follow-ups on file.  No orders of the defined types were placed in this encounter.   Requested Prescriptions    No prescriptions requested or ordered in this encounter

## 2020-10-15 ENCOUNTER — Encounter: Payer: Self-pay | Admitting: Family

## 2020-10-15 ENCOUNTER — Other Ambulatory Visit: Payer: Medicare Other | Admitting: Internal Medicine

## 2020-10-18 ENCOUNTER — Other Ambulatory Visit: Payer: Self-pay | Admitting: Family

## 2020-10-18 DIAGNOSIS — L0390 Cellulitis, unspecified: Secondary | ICD-10-CM

## 2020-10-20 NOTE — Progress Notes (Addendum)
Therapist, nutritional Palliative Care Consult Note Telephone: [phone]  Fax: [phone]  PATIENT NAME: Brandi Walsh DOB: 1946/01/08 MRN: [id number]  PRIMARY CARE PROVIDER:   [name], FNP  REFERRING PROVIDER:  [name], FNP [address] [place],  [place] [address]  RESPONSIBLE PARTYAlexandre, Walsh [phone]    Walsh,Brandi Granddaughter   [phone]     RECOMMENDATIONS and PLAN:  Palliative care encounter  Z51.5  1.  Advance care planning:  Goals of care per family's report remain unchanged and include quality of care at home and continue adult day care program.  Transition to Hospice care for comfort when additional decline occurs.  Advanced directives are as follows: Code status is DNR.  Palliative care will continue to followup with patient at her day care center in aprox 4-6 weeks for re-evaluation of cognitive/functional abilities and L hip wounds. .     2.  Vascular dementia with behavioral disturbances:  At baseline.  FAST stage is 7C with intermittent aggressiveness  Aromatherapy. No use of Seroquel unless significant agitation noted per family request.  Monitor for cognitive, functional changes and continued weight loss.     3. L hip decubitus:  Continue use of Bactroban on wound and cover with nonstick bandage daily. Reposition every 2 hours. Attempt to increase dietary protein.  Monitor for changes of status of wounds.  I spent 35 minutes providing this consultation,  From 0900  to 0935. More than 50% of the time in this consultation was spent coordinating communication with patient, caregiver and grand daughter.   HISTORY OF PRESENT ILLNESS:  Follow-up with Brandi Walsh.  Caregiver states that patient eats well when at adult daycare and naps oftenfalls within the past several weeks. She remains dependent on caregivers and family for completion of all ADLs.  No reports  of additional wounds other than the L lateral hip. She completed an alternate antibiotic Palliative Care was asked to help address goals of care.   CODE STATUS: DNR  PPS: 40%  weak HOSPICE ELIGIBILITY/DIAGNOSIS: TBD  PAST MEDICAL HISTORY:  Past Medical History:  Diagnosis Date  . Alzheimer's disease (HCC)   . COUGH DUE TO ACE INHIBITORS 11/25/2009   Qualifier: Diagnosis of  By: Everardo All MD, Cleophas Dunker   . Depression   . DIABETES MELLITUS, TYPE II 10/13/2007   Qualifier: Diagnosis of  By: Charlsie Quest RMA, Lucy    . HYPERLIPIDEMIA 10/13/2007   Qualifier: Diagnosis of  By: Tyrone Apple, Lucy    . HYPERTENSION 10/13/2007   Qualifier: Diagnosis of  By: Tyrone Apple, Lucy    . MENOPAUSAL SYNDROME 10/13/2007   Qualifier: Diagnosis of  By: Charlsie Quest RMA, Lucy    . OSTEOPOROSIS 10/13/2007   Qualifier: Diagnosis of  By: Tyrone Apple, Lucy      SOCIAL HX: Lives with husband Social History    PERTINENT MEDICATIONS:  Outpatient Encounter Medications as of 08/07/2020  Medication Sig  . Cholecalciferol (VITAMIN D) 50 MCG (2000 UT) tablet Take 2,000 Units by mouth daily.  Marland Kitchen donepezil (ARICEPT) 10 MG tablet Take 1 tablet daily  . escitalopram (LEXAPRO) 10 MG tablet Take 1.5 tablets (15 mg total) by mouth daily.  Marland Kitchen losartan (COZAAR) 50 MG tablet Take 1 tablet (50 mg total) by mouth daily.  . mupirocin oiment (BACTROBAN) 2 % Apply topically 3 (three) times daily.  Marland Kitchen omeprazole (PRILOSEC) 20 MG capsule Take 1 capsule (20 mg total) by mouth daily.  . [  DISCONTINUED] cephALEXin (KEFLEX) 500 MG capsule Take 1 capsule (500 mg total) by mouth 3 (three) times daily.  . [DISCONTINUED] rosuvastatin (CRESTOR) 40 MG tablet Take 1 tablet (40 mg total) by mouth daily.       PHYSICAL EXAM:   General: NAD, very frail appearing, thin elderly female in recliner Cardiovascular: regular rate and rhythm Pulmonary: unlabored respirations Abdomen: soft, nontender, + bowel sounds Extremities: no edema, decreased muscle mass. Very  stiff knees Skin: Recurrent 1x1cm stage 2 decubitus at the L hip over prominent trochanter region and adjacent superficial abrasion.. Scant serous sanguinous discharge without  erythema or tenderness. No increased warmth.  Bactroban and nonstick bandage reapplied.   Neurological: Alert and unable to determine orientation due to cognitive deficit. Speaks in 3-4 word sentences. Follows instructions  Weakness  Margaretha Sheffield, NP-C

## 2020-10-23 ENCOUNTER — Other Ambulatory Visit: Payer: Self-pay

## 2020-10-23 ENCOUNTER — Ambulatory Visit (INDEPENDENT_AMBULATORY_CARE_PROVIDER_SITE_OTHER): Payer: Medicare Other | Admitting: Family

## 2020-10-23 ENCOUNTER — Encounter: Payer: Self-pay | Admitting: Family

## 2020-10-23 VITALS — BP 108/64 | HR 58 | Temp 98.1°F | Ht 62.0 in | Wt 105.0 lb

## 2020-10-23 DIAGNOSIS — Z23 Encounter for immunization: Secondary | ICD-10-CM

## 2020-10-23 DIAGNOSIS — K649 Unspecified hemorrhoids: Secondary | ICD-10-CM | POA: Diagnosis not present

## 2020-10-23 DIAGNOSIS — S81802A Unspecified open wound, left lower leg, initial encounter: Secondary | ICD-10-CM | POA: Diagnosis not present

## 2020-10-23 MED ORDER — PROCTOFOAM HC 1-1 % EX FOAM: 1.0000 | Freq: Three times a day (TID) | CUTANEOUS | 1 refills | Status: DC

## 2020-10-23 MED ORDER — MUPIROCIN 2 % EX OINT: TOPICAL_OINTMENT | Freq: Three times a day (TID) | CUTANEOUS | 1 refills | Status: DC

## 2020-10-23 MED ORDER — DOXYCYCLINE HYCLATE 100 MG PO TABS: 100.0000 mg | ORAL_TABLET | Freq: Two times a day (BID) | ORAL | 0 refills | Status: DC

## 2020-10-23 NOTE — Progress Notes (Signed)
Brandi Walsh is a 74 y.o. female with the following history as recorded in Epic[employer]:  Patient Active Problem List   Diagnosis Date Noted  . 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  . Abnormal liver enzymes 09/19/2019  . Blurred vision 09/19/2019  . Hypercalcemia 03/15/2019  . Major depression in full remission (HCC) 11/24/2018  . Memory loss 12/24/2016  . Encounter for well adult exam with abnormal findings 06/13/2013  . Hyperparathyroidism, primary (HCC) 06/12/2013  . GERD (gastroesophageal reflux disease) 02/19/2012  . 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  . COUGH DUE TO ACE INHIBITORS 11/25/2009  . Diabetes (HCC) 10/13/2007  . Dyslipidemia 10/13/2007  . Essential hypertension 10/13/2007  . MENOPAUSAL SYNDROME 10/13/2007  . Osteoporosis 10/13/2007    Current Outpatient Medications  Medication Sig Dispense Refill  . Cholecalciferol (VITAMIN D) 50 MCG (2000 UT) tablet Take 2,000 Units by mouth daily.    Marland Kitchen donepezil (ARICEPT) 10 MG tablet Take 1 tablet daily 90 tablet 3  . escitalopram (LEXAPRO) 10 MG tablet Take 1.5 tablets (15 mg total) by mouth daily. 45 tablet 3  . losartan (COZAAR) 50 MG tablet Take 1 tablet (50 mg total) by mouth daily. 90 tablet 1  . mupirocin ointment (BACTROBAN) 2 % Apply topically 3 (three) times daily. 30 g 1  . omeprazole (PRILOSEC) 20 MG capsule Take 1 capsule (20 mg total) by mouth daily. 90 capsule 5  . rosuvastatin (CRESTOR) 40 MG tablet TAKE 1 TABLET BY MOUTH EVERY DAY 90 tablet 1  . doxycycline (VIBRA-TABS) 100 MG tablet Take 1 tablet (100 mg total) by mouth 2 (two) times daily. 20 tablet 0  . hydrocortisone-pramoxine (PROCTOFOAM HC) rectal foam Place 1 applicator rectally 3 (three) times daily. 10 g 1   No current facility-administered medications for this visit.    Allergies: Lovastatin  Past Medical History:  Diagnosis Date  . Alzheimer's disease (HCC)   . COUGH DUE TO ACE INHIBITORS 11/25/2009   Qualifier: Diagnosis of  By: Everardo All MD, Cleophas Dunker   . Depression   .  DIABETES MELLITUS, TYPE II 10/13/2007   Qualifier: Diagnosis of  By: Charlsie Quest RMA, Lucy    . HYPERLIPIDEMIA 10/13/2007   Qualifier: Diagnosis of  By: Tyrone Apple, Lucy    . HYPERTENSION 10/13/2007   Qualifier: Diagnosis of  By: Tyrone Apple, Lucy    . MENOPAUSAL SYNDROME 10/13/2007   Qualifier: Diagnosis of  By: Charlsie Quest RMA, Lucy    . OSTEOPOROSIS 10/13/2007   Qualifier: Diagnosis of  By: Samara Snide      Past Surgical History:  Procedure Laterality Date  . ABDOMINAL HYSTERECTOMY    . BREAST BIOPSY  08/2002  . TONSILLECTOMY      Family History  Problem Relation Age of Onset  . Anxiety disorder Mother   . Hyperparathyroidism Sister   . Cancer Neg Hx   . Breast cancer Neg Hx     Social History   Tobacco Use  . Smoking status: Never Smoker  . Smokeless tobacco: Never Used  Substance Use Topics  . Alcohol use: No    Subjective:   Brought to the office by her granddaughter who provides history; Would like to get flu shot; Concerned for persisting wound on left hip- present x2 months; home health has discharged her; still using Bactroban but concerned about length of time symptoms present; Concerned for worsening hemorrhoids; using OTC medication with little relief;   Objective:  Vitals:   10/23/20 0847  BP: 108/64  Pulse: (!) 58  Temp: 98.1 F (36.7 C)  TempSrc: Oral  SpO2: 97%  Weight: 105 lb (47.6 kg)  Height: 5\' 2"  (1.575 m)    General: Well developed, well nourished, in no acute distress  Skin : Warm and dry. 2 pustular lesions noted over left hip with surrounding erythema Head: Normocephalic and atraumatic  Lungs: Respirations unlabored;  Rectal exam- multiple large external hemorrhoids noted; do not appear to be thrombosed Neurologic: non verbal; uses granddaughter for assistance with walking;  Assessment:  1. Hemorrhoids, unspecified hemorrhoid type   2. Non-healing wound of left lower extremity   3. Needs flu shot     Plan:  1. Rx for Proctofoam HC apply  tid; suspect this will be of little benefit; based on size, have referred to GI for further evaluation; 2. Difficult to get area to heal due to patient sitting for such extended periods of time; home health has tried different techniques; 2 rounds of different oral antibiotics have been given; try Doxycycline and refer to wound center; ? Need for IV antibiotics; 3. Flu shot given;   This visit occurred during the SARS-CoV-2 public health emergency.  Safety protocols were in place, including screening questions prior to the visit, additional usage of staff PPE, and extensive cleaning of exam room while observing appropriate contact time as indicated for disinfecting solutions.     No follow-ups on file.  Orders Placed This Encounter  Procedures  . Flu Vaccine QUAD High Dose(Fluad)  . Ambulatory referral to Gastroenterology    Referral Priority:   Routine    Referral Type:   Consultation    Referral Reason:   Specialty Services Required    Number of Visits Requested:   1  . AMB referral to wound care center    Referral Priority:   Routine    Referral Type:   Consultation    Number of Visits Requested:   1    Requested Prescriptions   Signed Prescriptions Disp Refills  . hydrocortisone-pramoxine (PROCTOFOAM HC) rectal foam 10 g 1    Sig: Place 1 applicator rectally 3 (three) times daily.  doxycycline (VIBRA-TABS) 100 MG tablet 20 tablet 0    Sig: Take 1 tablet (100 mg total) by mouth 2 (two) times daily.  . mupirocin ointment (BACTROBAN) 2 % 30 g 1    Sig: Apply topically 3 (three) times daily.

## 2020-10-28 ENCOUNTER — Encounter: Payer: Self-pay | Admitting: Family

## 2020-10-28 ENCOUNTER — Other Ambulatory Visit: Payer: Self-pay | Admitting: Family

## 2020-10-28 ENCOUNTER — Telehealth: Payer: Self-pay | Admitting: Family

## 2020-10-28 DIAGNOSIS — R300 Dysuria: Secondary | ICD-10-CM

## 2020-10-28 NOTE — Telephone Encounter (Signed)
Please print out label and leave a specimen cup and urine hat up front for them to pick up. I will put orders in at [facility] and they can drop off at their convenience.  We do not have resources/ contacts for information for home ramp. The Palliative care provider may be better source of information for that.   Let me check with referrals about wound care options. I would recommend they get on cancellation list for sure. That usually gets a patient seen sooner.

## 2020-10-28 NOTE — Telephone Encounter (Signed)
   [name], patient's granddaughter calling to request order for urine test, wants to rule out UTI due to patient "being a little feisty" She also wants to know if office has any resources/contact information on getting handicap ramp built for home. Also wound care appointment made for 1/5, should patient be referred elsewhere for sooner appointment?

## 2020-10-30 ENCOUNTER — Telehealth: Payer: Self-pay | Admitting: Gastroenterology

## 2020-10-30 ENCOUNTER — Other Ambulatory Visit: Payer: Self-pay | Admitting: Family

## 2020-10-30 ENCOUNTER — Encounter: Payer: Self-pay | Admitting: Family

## 2020-10-30 DIAGNOSIS — K649 Unspecified hemorrhoids: Secondary | ICD-10-CM

## 2020-10-30 NOTE — Telephone Encounter (Signed)
Called granddaughter no answerer,mailbox full.

## 2020-10-30 NOTE — Telephone Encounter (Signed)
Spoke to patients granddaughter who reports that patient had an appointment with her PCP on 10/23/20 for large external hemorrhoids. She was inquiring about an appointment to have the hemorrhoids removed on the same day as the appointment. Brandi Walsh was informed that Dr Barron Alvine would need to evaluate her first and would most likely refer her to the surgical center for a hemorrhoidectomy as he does not preform that procedure. Brandi Walsh said the patient could not wait until his next available on 12/04/20. She was advised to contact her PCP who recommended hemorrhoid surgery for a referral to [facility]. The granddaughter voiced understanding.

## 2020-11-06 ENCOUNTER — Encounter: Payer: Self-pay | Admitting: Family

## 2020-11-07 ENCOUNTER — Other Ambulatory Visit: Payer: Self-pay | Admitting: Family

## 2020-11-07 MED ORDER — DOXYCYCLINE HYCLATE 100 MG PO TABS: 100.0000 mg | ORAL_TABLET | Freq: Two times a day (BID) | ORAL | 0 refills | Status: DC

## 2020-11-11 DIAGNOSIS — E785 Hyperlipidemia, unspecified: Secondary | ICD-10-CM

## 2020-11-11 HISTORY — DX: Hyperlipidemia, unspecified: E78.5

## 2020-11-17 ENCOUNTER — Encounter: Payer: Self-pay | Admitting: Family

## 2020-11-25 ENCOUNTER — Telehealth: Payer: Self-pay | Admitting: Family

## 2020-11-25 NOTE — Telephone Encounter (Signed)
Copied from CRM [id number]. Topic: Medicare AWV >> Nov 25, 2020  2:51 PM Claudette Laws R wrote: Reason for CRM:  No answer unable to leave message for patient to call back and schedule Medicare Annual Wellness Visit (AWV) in office.   If not able to come in office, please offer to do virtually.  45 Minute appointment  Last AWV  05/24/2018  Please schedule at anytime with the Nurse Health Advisor.

## 2020-12-04 ENCOUNTER — Encounter (HOSPITAL_BASED_OUTPATIENT_CLINIC_OR_DEPARTMENT_OTHER): Payer: Medicare Other | Admitting: Physician Assistant

## 2020-12-06 ENCOUNTER — Telehealth: Payer: Medicare Other | Admitting: Psychiatry

## 2020-12-22 ENCOUNTER — Encounter: Payer: Self-pay | Admitting: Family

## 2020-12-25 ENCOUNTER — Telehealth: Payer: Self-pay | Admitting: *Deleted

## 2020-12-25 NOTE — Telephone Encounter (Signed)
Called number below/ was transferred to incontinence dept. Per Marylene Land the patients insurance does not cover the supplies. She can call them herself and provide a credit card # so they can place the order. Also, Marylene Land states they do not have/supply wipes. Patient's granddaughter informed.     [employer], [phone]. Ext: [phone]  Bed Underpads/Chucks - 60 Disposable Underwear - 60 Wipes - 100  *all quantities requested as 30 day supplies

## 2021-01-10 ENCOUNTER — Other Ambulatory Visit: Payer: Self-pay

## 2021-01-10 ENCOUNTER — Ambulatory Visit: Payer: Medicare Other | Admitting: Family

## 2021-01-10 ENCOUNTER — Encounter (HOSPITAL_COMMUNITY): Payer: Self-pay | Admitting: Emergency Medicine

## 2021-01-10 ENCOUNTER — Ambulatory Visit (INDEPENDENT_AMBULATORY_CARE_PROVIDER_SITE_OTHER): Payer: Medicare Other

## 2021-01-10 ENCOUNTER — Other Ambulatory Visit: Payer: Self-pay | Admitting: Family

## 2021-01-10 ENCOUNTER — Ambulatory Visit (HOSPITAL_COMMUNITY)
Admission: EM | Admit: 2021-01-10 | Discharge: 2021-01-10 | Disposition: A | Discharge status: Home / Self Care | Payer: Medicare Other

## 2021-01-10 DIAGNOSIS — M7989 Other specified soft tissue disorders: Secondary | ICD-10-CM

## 2021-01-10 DIAGNOSIS — M79642 Pain in left hand: Secondary | ICD-10-CM

## 2021-01-10 NOTE — Discharge Instructions (Addendum)
Elevate hand on pillow or arm rest to assist with swelling  Can take over the counter ibuprofen every six hours to assist with pain   Follow up with primary doctor on Monday for reevaluation   Can return to Urgent care for worsening symptoms such as increased pain, redness, worsening swelling, fever, rash

## 2021-01-10 NOTE — ED Provider Notes (Signed)
[facility]    CSN: [id number] Arrival date & time: 01/10/21  1248      History   Chief Complaint Chief Complaint  Patient presents with  . Hand Problem    HPI Brandi Walsh is a 75 y.o. female.   Patient presents with left hand swelling that began 10 days ago and has moved into forearm. Described as painful. Unable to close hand. No precipitating event  Attempted benadryl for relief but unsure if allergic reaction. Patient has dementia and husband is a poor historian of event.    Past Medical History:  Diagnosis Date  . Alzheimer's disease (HCC)   . COUGH DUE TO ACE INHIBITORS 11/25/2009   Qualifier: Diagnosis of  By: Everardo All MD, Cleophas Dunker   . Depression   . DIABETES MELLITUS, TYPE II 10/13/2007   Qualifier: Diagnosis of  By: Charlsie Quest RMA, Lucy    . HYPERLIPIDEMIA 10/13/2007   Qualifier: Diagnosis of  By: Tyrone Apple, Lucy    . HYPERTENSION 10/13/2007   Qualifier: Diagnosis of  By: Tyrone Apple, Lucy    . MENOPAUSAL SYNDROME 10/13/2007   Qualifier: Diagnosis of  By: Charlsie Quest RMA, Lucy    . OSTEOPOROSIS 10/13/2007   Qualifier: Diagnosis of  By: Samara Snide      Patient Active Problem List   Diagnosis Date Noted  . 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  . Abnormal liver enzymes 09/19/2019  . Blurred vision 09/19/2019  . Hypercalcemia 03/15/2019  . Major depression in full remission (HCC) 11/24/2018  . Memory loss 12/24/2016  . Encounter for well adult exam with abnormal findings 06/13/2013  . Hyperparathyroidism, primary (HCC) 06/12/2013  . GERD (gastroesophageal reflux disease) 02/19/2012  . 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  . COUGH DUE TO ACE INHIBITORS 11/25/2009  . Diabetes (HCC) 10/13/2007  . Dyslipidemia 10/13/2007  . Essential hypertension 10/13/2007  . MENOPAUSAL SYNDROME 10/13/2007  . Osteoporosis 10/13/2007    Past Surgical History:  Procedure Laterality Date  . ABDOMINAL HYSTERECTOMY    . BREAST BIOPSY  08/2002  . TONSILLECTOMY      OB History   No obstetric  history on file.      Home Medications    Prior to Admission medications   Medication Sig Start Date End Date Taking? Authorizing Provider  Cholecalciferol (VITAMIN D) 50 MCG (2000 UT) tablet Take 2,000 Units by mouth daily.    [provider]  donepezil (ARICEPT) 10 MG tablet Take 1 tablet daily 06/25/20   Van Clines, MD  doxycycline (VIBRA-TABS) 100 MG tablet Take 1 tablet (100 mg total) by mouth 2 (two) times daily. 11/07/20   [name], FNP  escitalopram (LEXAPRO) 10 MG tablet Take 1.5 tablets (15 mg total) by mouth daily. 09/05/20 12/04/20  [name], PMHNP  hydrocortisone-pramoxine (PROCTOFOAM [facility]) rectal foam Place 1 applicator rectally 3 (three) times daily. 10/23/20   [name], FNP  losartan (COZAAR) 50 MG tablet Take 1 tablet by mouth once daily 01/10/21   [name], FNP  mupirocin ointment (BACTROBAN) 2 % Apply topically 3 (three) times daily. 10/23/20   [name], FNP  omeprazole (PRILOSEC) 20 MG capsule Take 1 capsule (20 mg total) by mouth daily. 08/06/20   [name], [name], DO  rosuvastatin (CRESTOR) 40 MG tablet TAKE 1 TABLET BY MOUTH EVERY DAY 08/19/20   [name], FNP    Family History Family History  Problem Relation Age of Onset  . Anxiety disorder Mother   . Hyperparathyroidism Sister   .  Cancer Neg Hx   . Breast cancer Neg Hx     Social History Social History   Tobacco Use  . Smoking status: Never Smoker  . Smokeless tobacco: Never Used  Vaping Use  . Vaping Use: Never used  Substance Use Topics  . Alcohol use: No  . Drug use: No     Allergies   Lovastatin   Review of Systems Review of Systems  Constitutional: Negative.   HENT: Negative.   Respiratory: Negative.   Cardiovascular: Negative.   Gastrointestinal: Negative.   Genitourinary: Negative.   Musculoskeletal: Negative.        Hand swelling  Skin: Negative.   Neurological: Negative.    Psychiatric/Behavioral: Negative.      Physical Exam Triage Vital Signs ED Triage Vitals  Enc Vitals Group     BP 01/10/21 1319 127/68     Pulse Rate 01/10/21 1319 78     Resp 01/10/21 1319 16     Temp 01/10/21 1319 (!) 97.3 F (36.3 C)     Temp Source 01/10/21 1319 Temporal     SpO2 01/10/21 1319 98 %     Weight --      Height --      Head Circumference --      Peak Flow --      Pain Score 01/10/21 1318 10     Pain Loc --      Pain Edu? --      Excl. in GC? --    No data found.  Updated Vital Signs BP 127/68 (BP Location: Right Arm)   Pulse 78   Temp (!) 97.3 F (36.3 C) (Temporal)   Resp 16   SpO2 98%   Visual Acuity Right Eye Distance:   Left Eye Distance:   Bilateral Distance:    Right Eye Near:   Left Eye Near:    Bilateral Near:     Physical Exam Constitutional:      Appearance: Normal appearance. She is normal weight.  HENT:     Head: Normocephalic.  Eyes:     Extraocular Movements: Extraocular movements intact.  Pulmonary:     Effort: Pulmonary effort is normal.  Musculoskeletal:     Right hand: Normal.       Arms:     Cervical back: Normal range of motion.     Comments: Non-pitting edema present over entire left hand and mid forearm, no erythema, no break in skin, no rash, no warmth, appropriate skin tone, 50% finger flexion, extension intact, Wrist ROM intact but pain elicited    Skin:    General: Skin is warm and dry.  Neurological:     General: No focal deficit present.     Mental Status: She is alert and oriented to person, place, and time. Mental status is at baseline.  Psychiatric:        Mood and Affect: Mood normal.        Behavior: Behavior normal.        Thought Content: Thought content normal.        Judgment: Judgment normal.      UC Treatments / Results  Labs (all labs ordered are listed, but only abnormal results are displayed) Labs Reviewed - No data to display  EKG   Radiology No results  found.  Procedures Procedures (including critical care time)  Medications Ordered in UC Medications - No data to display  Initial Impression / Assessment and Plan / UC Course  I have reviewed  the triage vital signs and the nursing notes.  Pertinent labs & imaging results that were available during my care of the patient were reviewed by me and considered in my medical decision making (see chart for details).  Right hand swelling  1. Hand x-ray- negative for injury, soft tissue swelling present  2. Elevate hand and arm on pillow while in sitting position 3. OTC ibuprofen every six hours as needed for pain 4. Follow up with PCP for further evaluation if swelling persist. Follow up with Urgent care if symptoms worsen    Final Clinical Impressions(s) / UC Diagnoses   Final diagnoses:  None   Discharge Instructions   None    ED Prescriptions    None     PDMP not reviewed this encounter.   Valinda Hoar, NP 01/10/21 1511

## 2021-01-10 NOTE — ED Triage Notes (Signed)
Pt has some left hand swelling that started a week ago and also lower back pain. Pt states that it hurts for anyone to touch her hand her husband gave her benadryl and it did not help just to see if she had an allergic reaction to anything. Pt denies any urinary sx

## 2021-01-14 ENCOUNTER — Ambulatory Visit: Payer: Medicare Other | Admitting: Family

## 2021-01-22 ENCOUNTER — Telehealth: Payer: Medicare Other | Admitting: Psychiatry

## 2021-01-27 ENCOUNTER — Encounter: Payer: Self-pay | Admitting: Neurology

## 2021-01-27 ENCOUNTER — Encounter: Payer: Self-pay | Admitting: Family

## 2021-01-27 ENCOUNTER — Ambulatory Visit: Payer: Medicare Other | Admitting: Neurology

## 2021-01-27 ENCOUNTER — Other Ambulatory Visit: Payer: Self-pay

## 2021-01-27 VITALS — BP 134/76 | HR 86 | Ht 60.0 in | Wt 115.0 lb

## 2021-01-27 DIAGNOSIS — F0281 Dementia in other diseases classified elsewhere with behavioral disturbance: Secondary | ICD-10-CM

## 2021-01-27 DIAGNOSIS — G301 Alzheimer's disease with late onset: Secondary | ICD-10-CM

## 2021-01-27 DIAGNOSIS — G20 Parkinson's disease: Secondary | ICD-10-CM | POA: Diagnosis not present

## 2021-01-27 MED ORDER — DONEPEZIL HCL 10 MG PO TABS: ORAL_TABLET | ORAL | 3 refills | Status: DC

## 2021-01-27 NOTE — Progress Notes (Signed)
NEUROLOGY FOLLOW UP OFFICE NOTE  Brandi Walsh [id number] [birth year]  HISTORY OF PRESENT ILLNESS: I had the pleasure of seeing Brandi Walsh in follow-up in the neurology clinic on 01/27/2021.  The patient was last seen 7 months ago for Alzheimer's disease. She is again accompanied by her granddaughter Brandi Walsh who supplements the history today.  Records and images were personally reviewed where available.  I personally reviewed MRI brain with and without contrast done 06/2020 which did not show any acute changes. There was mild diffuse atrophy, bilateral hippocampal atrophy, mild chronic microvascular disease. Since her last visit, Brandi Walsh had contacted our office through MyChart to report general decline in status and wellbeing. She is no longer performing ADLs independently. She had been constantly anxious. In September 2021, she was non-verbal with significant decreased appetite. Brandi Walsh later updated that family started to understand that she chooses not to speak when she is agitated and would rather be left alone. She started having sundowning with auditory and visual hallucinations, which apparently resolved after she was treated with an antibiotic for a wound infection. She sees Psychiatry and has not needed Seroquel, currently on Lexapro 15mg  daily which helps with anxiety and mood. Sleep is good.   Brandi Walsh's main concern today is her mobility. She has had PT and OT at home but this has not helped much. They have a hard time getting her into bed, harder since her husband had surgery recently. helped last night and noticed she does not move once she is in bed. She is really stiff and unable to help them when they try to move her. She is stiff all day long, stiff getting up from a chair. She asks for help, which she did not need a few months ago. She reports pain all over, pain in her shoulders, elbows, knees. There is left hand swelling, initially this was intermittent but became more constant 2  weeks ago so she was brought to Urgent Care where xray did not show any injury, there was soft tissue swelling and she was instructed to elevate arm. She has not seen her PCP about this yet. She has a caregiver coming to their home a few hours a day, prior to her husband's surgery she was going to the caregiver's home. She denies any dizziness. She feels dizzy when supine, she has a history of vertigo. No side effects on Donepezil 10mg  daily.    History on Initial Assessment 06/25/2020: This is a 75 year old left-handed woman with a history of hypertension, hyperlipidemia, depression, anxiety, presenting for evaluation of recently diagnosed dementia on Neuropsychological testing done last 01/2020. Records reviewed. She reports her memory may not be so great. Her granddaughter 66 provides additional information. 02/2020 reports an incident in December 2018 when she had visual and auditory hallucinations and was admitted to [facility] and started on psychiatric medication. She had Neuropsychological testing in April 2018 with Dr. 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 Cognitive testing indicated possible very mild global attenuation of cognitive function relative to estimated premorbid intellectual abilities, with the only area of true impairment being phonemic verbal fluency and memory retrieval afor newly learned auditory information, probable mild frontal-subcortical dysfunction. Thought process was still mildly disorganized, unclear if longstanding and/or related to primary psychiatric disorder. She was no longer having delusions at that time. May 2018 reports that since then, there has been a steady decline with her memory. Psychiatric disorder  had been stable for 2 years with adjustment in Lexapro and Olanzapine. Over the past 6 months, Brandi MylarCarmen noticed a biggest turning point in her cognition. She also  noticed slower movements. She is overall more anxious. Her psychiatrist has advised monitoring for tardive dyskinesia, her lower jaw movements have progressively worsened. Brandi MylarCarmen was not sure about medication adherence, so she has been helping the last few weeks. She noted waxing and waning symptoms and requested repeat Neuropsychological testing. Records from Dr. 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 Cholinesterase inhibitor was recommended, she was started on Donepezil 5mg  daily by her Psychiatrist, no side effects.   She lives with her husband. She states her memory "may not be so great." Brandi MylarCarmen reports that she has always been very sharp, so the slightest things she does not remember would bother her. She denies getting lost driving but drives rarely, around once a month. She and her husband do bills together, there may be some missed bills. Her husband manages meals. She is slow to answer questions but answers appropriately and thoughtfully. She has some dizziness in the morning and endorses vertigo when lying flat. She has some blurred vision. She has had some pain between her shoulder pain for a few weeks. She reports numbness in both calves. She has had bilateral leg swelling even before Donepezil was started. She has occasional constipation. She has occasional hand tremors when anxious. No headaches, dysarthria/dysphagia, focal weakness, anosmia. Sleep is okay. 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 She reports mood is good. Brandi MylarCarmen notes she worries/stresses too much. Everyday is a little different with how she feels, how quickly she is moving. Sometimes she is a bit more agitated and does not want to eat,  saying her partials don't fit well or her things get stuck (she is s/p esophageal dilatation). Brandi MylarCarmen notes she has always been thoughtful but not as slow. She has always been very precise and detail-oriented, which is not changed, but she is not usually as slow. No visual/auditory hallucinations. She is independent with dressing and bathing but gets stuck sometimes putting on clothes. If she gets stuck in a thought, she freezes and cannot move. No recent changes in psychiatric medication, a year ago Lexapro was increased to 10mg  to increase motivation, which has not helped much. Brandi MylarCarmen notes there was more lucidity to her surroundings at that time, which is not present now. No family history of dementia, no history of concussions or alcohol use.   PAST MEDICAL HISTORY: Past Medical History:  Diagnosis Date  . Alzheimer's disease (HCC)   . COUGH DUE TO ACE INHIBITORS 11/25/2009   Qualifier: Diagnosis of  By: Everardo AllEllison MD, Cleophas DunkerSean A   . Depression   . DIABETES MELLITUS, TYPE II 10/13/2007   Qualifier: Diagnosis of  By: Charlsie QuestBrand RMA, Lucy    . HYPERLIPIDEMIA 10/13/2007   Qualifier: Diagnosis of  By: Tyrone AppleBrand RMA, Lucy    . HYPERTENSION 10/13/2007   Qualifier: Diagnosis of  By: Tyrone AppleBrand RMA, Lucy    . MENOPAUSAL SYNDROME 10/13/2007   Qualifier: Diagnosis of  By: Charlsie QuestBrand RMA, Lucy    . OSTEOPOROSIS 10/13/2007   Qualifier: Diagnosis of  By: Samara SnideBrand RMA, Lucy      MEDICATIONS: Current Outpatient Medications on File Prior to Visit  Medication Sig Dispense Refill  .  Cholecalciferol (VITAMIN D) 50 MCG (2000 UT) tablet Take 2,000 Units by mouth daily.    Marland Kitchen donepezil (ARICEPT) 10 MG tablet Take 1 tablet daily 90 tablet 3  . escitalopram (LEXAPRO) 10 MG tablet Take 15 mg by mouth daily. Take 1 1/2 tab daily    . losartan (COZAAR) 50 MG tablet Take 1 tablet by mouth once daily 90 tablet 1  . mupirocin ointment (BACTROBAN) 2 % Apply topically 3 (three) times daily. 30 g 1  . omeprazole (PRILOSEC) 20 MG capsule Take 1  capsule (20 mg total) by mouth daily. 90 capsule 5  . rosuvastatin (CRESTOR) 40 MG tablet TAKE 1 TABLET BY MOUTH EVERY DAY 90 tablet 1  . Cholecalciferol (VITAMIN D) 50 MCG (2000 UT) tablet  (Patient not taking: Reported on 01/27/2021)    . doxycycline (VIBRA-TABS) 100 MG tablet Take 1 tablet (100 mg total) by mouth 2 (two) times daily. 10 tablet 0  . escitalopram (LEXAPRO) 10 MG tablet Take 1.5 tablets (15 mg total) by mouth daily. 45 tablet 3  . hydrocortisone-pramoxine (PROCTOFOAM HC) rectal foam Place 1 applicator rectally 3 (three) times daily. 10 g 1   No current facility-administered medications on file prior to visit.    ALLERGIES: Allergies  Allergen Reactions  . Lovastatin     REACTION: Rash    FAMILY HISTORY: Family History  Problem Relation Age of Onset  . Anxiety disorder Mother   . Hyperparathyroidism Sister   . Cancer Neg Hx   . Breast cancer Neg Hx     SOCIAL HISTORY: Social History   Socioeconomic History  . Marital status: Married    Spouse name: Not on file  . Number of children: Not on file  . Years of education: Not on file  . Highest education level: Not on file  Occupational History  . Occupation: Retired   Tobacco Use  . Smoking status: Never Smoker  . Smokeless tobacco: Never Used  Vaping Use  . Vaping Use: Never used  Substance and Sexual Activity  . Alcohol use: No  . Drug use: No  . Sexual activity: Yes  Other Topics Concern  . Not on file  Social History Narrative   Left Handed   Two Story Home   Lives with Husband    Drinks coffee and [employer] sometimes   Social Determinants of Health   Financial Resource Strain: Not on file  Food Insecurity: Not on file  Transportation Needs: Not on file  Physical Activity: Not on file  Stress: Not on file  Social Connections: Not on file  Intimate Partner Violence: Not on file     PHYSICAL EXAM: Vitals:   01/27/21 0842  BP: 134/76  Pulse: 86  SpO2: 98%   General: No acute  distress, flat affect Head:  Normocephalic/atraumatic Skin/Extremities: No rash, left hand pitting edema Neurological Exam: alert and oriented to person, place, and time. No aphasia or dysarthria. Fund of knowledge is appropriate.  Recent and remote memory are intact.  Attention and concentration are normal.  MMSE 20/28 (unable to write due to left hand swelling) MMSE - Mini Mental State Exam 01/27/2021  Orientation to time 2  Orientation to Place 5  Registration 3  Attention/ Calculation 3  Recall 0  Language- name 2 objects 2  Language- repeat 1  Language- follow 3 step command 3  Language- read & follow direction 1  Write a sentence 0  Copy design 0  Total score 20/28   Cranial nerves: Pupils  equal, round. Extraocular movements intact with no nystagmus. Visual fields full.  No facial asymmetry.  Motor: Bulk and tone increased with cogwheeling on right wrist, pain with movement on left wrist. muscle strength at least 4/5 on right UE reporting pain on right shoulder, 5/5 on left UE and both LE. Finger to nose testing intact.  Gait slow and cautious, had significantly difficulty getting up from chair. No tremors today.    IMPRESSION: This is a 75 yo LH woman with a history of hypertension, hyperlipidemia, depression, anxiety, with dementia, probably Alzheimer's disease on Neuropsychological evaluation in 01/2020. MRI brain showed bilateral hippocampal atrophy. MMSE today 20/28 (unable to write due to left hand swelling). Main concern continues to be mobility, although she is bradykinetic with some cogwheeling, she reports a significant amount of pain with movements, discussed follow-up with PCP for left hand swelling and evaluation/treatment for arthritis as potential cause of body pains. Discussed option of doing a trial of Levodopa for bradykinesia, Brandi Walsh would like to see PCP first due to potential side effects of Levodopa. Continue Donepezil 10 mg daily. Continue close supervision. Resources  regarding exercise programs and day programs provided today. Follow-up in 6-8 months, they know to call for any changes.   Thank you for allowing me to participate in her care.  Please do not hesitate to call for any questions or concerns.    Patrcia Dolly, M.D.   CC: Ria Clock, FNP

## 2021-01-27 NOTE — Patient Instructions (Addendum)
1. Continue Donepezil 10mg  daily  2. Schedule appointment with PCP to discuss left hand swelling and joint/body pains  3. Continue close supervision  4. Follow-up in 6-8 months, call for any changes   Community Parkinson's Exercise Programs   Parkinson's Wellness Recovery Exercise Programs:   PWR! Moves PD Exercise Class:  This is a therapist-led exercise class for people with Parkinson's disease in the [place] community. It consists of a one-hour exercise class each week. Classes are offered in eight-week sessions, and the cost per session is $80. Class size is limited to a maximum of 20 participants. Participant criteria includes: Participant must be able to get up and down from the floor with minimal to no assistance, have had 0-1 falls in the past 6 months, and have completed physical or occupational therapy at [facility] within the past year.  To find out more about session dates, questions, or to register, please contact Dimensions, Physical Therapist, or Lonia Blood, Physical Modena Morrow, at [facility] at [phone].  PWR! Circuit Class:  This is a therapist-led exercise class with intervals of circuit activities incorporating PWR! Moves into functional activities. It consists of one 45-minute exercise class per week. Classes are offered in eight-week sessions, and the cost per session is $120. Class size is limited to a maximum of eight participants to allow for hands-on instruction. Participant criteria: class is ideal for people with Parkinson's disease who have completed PWR! Moves Exercise Class or who are currently independently exercising and want to be challenged, must be able to walk independently with 0-1 falls in the past 6 months, able to get up and down from the floor independently, able to sit to stand independently, and able to jog 20 feet.   To find out more about session dates, questions, or to  register, please contact [phone], Physical Therapist, or Lonia Blood, Physical Modena Morrow, at [facility] at [phone].   [employer] Parkinson's Cycle:   Parkinson's Cycle Class at [facility] This is an ongoing class on Monday and Thursday mornings at 10:45 a.m. A healthcare provider referral is required to enroll. This class is FREE to participants, and you do not have to be a member of the [employer] to enroll. Contact Beth at [phone] or [email or web address]. Parkinson's Cycle Class at [facility] Ongoing Class Monday, Wednesday, and Friday mornings at 9:00 a.m. A healthcare provider referral is required to enroll. This class is FREE to participants, and you do not have to be a member of the [employer] to enroll. Contact Marlee at [phone] or [email or web address]. Parkinson's Cycle Class at [facility] Ongoing Class every Friday mornings at 12 p.m.  A healthcare provider referral is required to enroll. This class is FREE to participants, and you do not have to be a member of the [employer] to enroll. Contact [phone].  Parkinson's Cycle Class at [facility] Ongoing Class every Monday at 12pm.  A healthcare provider referral is required to enroll. This class is FREE to participants, and you do not have to be a member of the [employer] to enroll. Contact Thursday at [phone] or  [email or web address].   [employer]:  [phone]  Classes are offered Mondays at 5:15 p.m. and Tuesdays and Thursdays at 12 p.m. at 05-01-1980. For more information, contact [phone] or visit [email or web address] or [email or web address]. [employer] [place] Classes are offered Monday, Wednesday, and Friday from 9:30 a.m. - 11:00  a.m. For more information, contact [phone] or [phone] or email [email or web address] or visit  [email or web address] or [email or web address]. [employer] (classes are offered at 2 locations) . [facility] [employer] in [place] (for more information, contact Thad Stovall at [phone] or email [email or web address] . Al Corpus at [facility] (class is open to the public -- for more information, contact Arn Medal at [phone] or email [email or web address]) Otho Bellows [place] Classes are held at [facility] in [place], [place]. For more information, call Dr. Ventura Bruns at [phone] or [email or web address].   Personal Training for Parkinson's:   [employer] Offers certified personal training to customize a program to meet your exercise needs to address Parkinson's disease. For more information, contact [phone] or visit [email or web address].  Community Dance for Parkinson's:   Community dance class for people with Parkinson's Disease Wednesdays at 9 a.m. The [employer] of [address], [place] [address] Please contact Arrie Senate [phone] for more information  Scholarships Available for Fitness Programs:  The [employer] for [employer] is a non-profit 501(C)3 organization run by volunteers, whose mission is to strive to empower those living with Parkinson's Disease (PD), Progressive Supra-Nuclear Palsy (PSP) and Multiple System Atrophy (MSA).  Through financial support, recipients benefit from individual and group programs. [phone] [email or web address]

## 2021-01-28 ENCOUNTER — Other Ambulatory Visit: Payer: Self-pay | Admitting: Family

## 2021-01-28 MED ORDER — METRONIDAZOLE 500 MG PO TABS: 500.0000 mg | ORAL_TABLET | Freq: Two times a day (BID) | ORAL | 0 refills | Status: DC

## 2021-01-28 MED ORDER — FLUCONAZOLE 150 MG PO TABS: 150.0000 mg | ORAL_TABLET | Freq: Once | ORAL | 0 refills | Status: AC

## 2021-01-29 ENCOUNTER — Encounter: Payer: Self-pay | Admitting: Family

## 2021-01-29 ENCOUNTER — Ambulatory Visit (INDEPENDENT_AMBULATORY_CARE_PROVIDER_SITE_OTHER): Payer: Medicare Other | Admitting: Family

## 2021-01-29 ENCOUNTER — Other Ambulatory Visit: Payer: Self-pay

## 2021-01-29 VITALS — BP 130/70 | HR 65 | Temp 98.3°F

## 2021-01-29 DIAGNOSIS — M25532 Pain in left wrist: Secondary | ICD-10-CM | POA: Diagnosis not present

## 2021-01-29 DIAGNOSIS — M7989 Other specified soft tissue disorders: Secondary | ICD-10-CM | POA: Diagnosis not present

## 2021-01-29 DIAGNOSIS — R7303 Prediabetes: Secondary | ICD-10-CM

## 2021-01-29 DIAGNOSIS — M25432 Effusion, left wrist: Secondary | ICD-10-CM

## 2021-01-29 NOTE — Patient Instructions (Signed)
When Brandi Walsh is up for it, please go to [place] location ( [address] and you can get the wrist X-ray and labs done at her convenience. No appointment is needed;  Labs is open from 7:30-5:30; X-ray is open from 8-5;

## 2021-01-29 NOTE — Progress Notes (Signed)
Brandi Walsh is a 75 y.o. female with the following history as recorded in EpicCare:  Patient Active Problem List   Diagnosis Date Noted  . 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  . Abnormal liver enzymes 09/19/2019  . Blurred vision 09/19/2019  . Hypercalcemia 03/15/2019  . Major depression in full remission (HCC) 11/24/2018  . Memory loss 12/24/2016  . Encounter for well adult exam with abnormal findings 06/13/2013  . Hyperparathyroidism, primary (HCC) 06/12/2013  . GERD (gastroesophageal reflux disease) 02/19/2012  . 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  . COUGH DUE TO ACE INHIBITORS 11/25/2009  . Diabetes (HCC) 10/13/2007  . Dyslipidemia 10/13/2007  . Essential hypertension 10/13/2007  . MENOPAUSAL SYNDROME 10/13/2007  . Osteoporosis 10/13/2007    Current Outpatient Medications  Medication Sig Dispense Refill  . Cholecalciferol (VITAMIN D) 50 MCG (2000 UT) tablet Take 2,000 Units by mouth daily.    . Cholecalciferol (VITAMIN D) 50 MCG (2000 UT) tablet     . donepezil (ARICEPT) 10 MG tablet Take 1 tablet daily 90 tablet 3  . escitalopram (LEXAPRO) 10 MG tablet Take 15 mg by mouth daily. Take 1 1/2 tab daily    . losartan (COZAAR) 50 MG tablet Take 1 tablet by mouth once daily 90 tablet 1  . metroNIDAZOLE (FLAGYL) 500 MG tablet Take 1 tablet (500 mg total) by mouth 2 (two) times daily. 14 tablet 0  . mupirocin ointment (BACTROBAN) 2 % Apply topically 3 (three) times daily. 30 g 1  . omeprazole (PRILOSEC) 20 MG capsule Take 1 capsule (20 mg total) by mouth daily. 90 capsule 5  . rosuvastatin (CRESTOR) 40 MG tablet TAKE 1 TABLET BY MOUTH EVERY DAY 90 tablet 1  . doxycycline (VIBRA-TABS) 100 MG tablet Take 1 tablet (100 mg total) by mouth 2 (two) times daily. 10 tablet 0  . escitalopram (LEXAPRO) 10 MG tablet Take 1.5 tablets (15 mg total) by mouth daily. 45 tablet 3  . hydrocortisone-pramoxine (PROCTOFOAM HC) rectal foam Place 1 applicator rectally 3 (three) times daily. 10 g 1   No current  facility-administered medications for this visit.    Allergies: Lovastatin  Past Medical History:  Diagnosis Date  . Alzheimer's disease (HCC)   . COUGH DUE TO ACE INHIBITORS 11/25/2009   Qualifier: Diagnosis of  By: Everardo All MD, Cleophas Dunker   . Depression   . DIABETES MELLITUS, TYPE II 10/13/2007   Qualifier: Diagnosis of  By: Charlsie Quest RMA, Lucy    . HYPERLIPIDEMIA 10/13/2007   Qualifier: Diagnosis of  By: Tyrone Apple, Lucy    . HYPERTENSION 10/13/2007   Qualifier: Diagnosis of  By: Tyrone Apple, Lucy    . MENOPAUSAL SYNDROME 10/13/2007   Qualifier: Diagnosis of  By: Charlsie Quest RMA, Lucy    . OSTEOPOROSIS 10/13/2007   Qualifier: Diagnosis of  By: Samara Snide      Past Surgical History:  Procedure Laterality Date  . ABDOMINAL HYSTERECTOMY    . BREAST BIOPSY  08/2002  . TONSILLECTOMY      Family History  Problem Relation Age of Onset  . Anxiety disorder Mother   . Hyperparathyroidism Sister   . Cancer Neg Hx   . Breast cancer Neg Hx     Social History   Tobacco Use  . Smoking status: Never Smoker  . Smokeless tobacco: Never Used  Substance Use Topics  . Alcohol use: No    Subjective:   Patient is accompanied by son; limited history; patient has dementia and is unable to participate in visit; [facility]en at [facility] on [facility]/11  with concerns for swelling in left hand; x-ray done at that time was normal; per son, no known injuries or falls; he does think the swelling "comes and goes" and often looks worse in the morning because she sleeps on her left side;    Objective:  Vitals:   01/29/21 1058  BP: 130/70  Pulse: 65  Temp: 98.3 F (36.8 C)  TempSrc: Oral  SpO2: 98%    General: Well developed, well nourished, in no acute distress  Skin : Warm and dry.  Head: Normocephalic and atraumatic  Lungs: Respirations unlabored; clear to auscultation bilaterally without wheeze, rales, rhonchi  CVS exam: normal rate and regular rhythm.  Musculoskeletal: No deformities; no active joint inflammation   Extremities: bilateral pedal edema of both hands- mild pitting noted on left hand, no edema or pitting noted of lower extremities; cyanosis, clubbing  Vessels: Symmetric bilaterally  Neurologic: Alert and oriented; speech intact; face symmetrical; walks with assistance/ shuffled gate  ; 1. Pain and swelling of left wrist   2. Bilateral hand swelling   3. Pre-diabetes     Plan:  Will check left wrist X-ray- only hand x-ray checked at [facility]; check bilateral vascular ultrasound- patient is adamant that she does not want to do any testing today- tentatively scheduled for tomorrow; Will check labs as well to evaluate for possible inflammatory arthritis source; will also check Hgba1c per family request;  Follow-up to be determined based on imaging results- may need to refer to vascular specialist;   Time spent discussing symptoms and coordinating care is 30 minutes  This visit occurred during the SARS-CoV-2 public health emergency.  Safety protocols were in place, including screening questions prior to the visit, additional usage of staff PPE, and extensive cleaning of exam room while observing appropriate contact time as indicated for disinfecting solutions.     No follow-ups on file.  Orders Placed This Encounter  Procedures  . DG Wrist Complete Left    Standing Status:   Future    Standing Expiration Date:   01/29/2022    Order Specific Question:   Reason for Exam (SYMPTOM  OR DIAGNOSIS REQUIRED)    Answer:   left wrist pain/ left hand swelling    Order Specific Question:   Preferred imaging location?    Answer:   [facility]  . Antinuclear Antib (ANA)    Standing Status:   Future    Standing Expiration Date:   01/29/2022  . Sedimentation rate    Standing Status:   Future    Standing Expiration Date:   01/29/2022  . Rheumatoid Factor    Standing Status:   Future    Standing Expiration Date:   01/29/2022  . HgB A1c    Standing Status:   Future    Standing Expiration Date:    01/29/2022    Requested Prescriptions    No prescriptions requested or ordered in this encounter

## 2021-01-30 ENCOUNTER — Ambulatory Visit (HOSPITAL_COMMUNITY)
Admission: RE | Admit: 2021-01-30 | Discharge: 2021-01-30 | Disposition: A | Discharge status: Home / Self Care | Payer: Medicare Other | Source: Ambulatory Visit | Attending: Cardiovascular Disease | Admitting: Cardiovascular Disease

## 2021-01-30 DIAGNOSIS — M7989 Other specified soft tissue disorders: Secondary | ICD-10-CM | POA: Diagnosis not present

## 2021-02-13 ENCOUNTER — Ambulatory Visit (INDEPENDENT_AMBULATORY_CARE_PROVIDER_SITE_OTHER)
Admission: RE | Admit: 2021-02-13 | Discharge: 2021-02-13 | Disposition: A | Discharge status: Home / Self Care | Payer: Medicare Other | Source: Ambulatory Visit | Attending: Family | Admitting: Family

## 2021-02-13 ENCOUNTER — Other Ambulatory Visit: Payer: Self-pay

## 2021-02-13 ENCOUNTER — Other Ambulatory Visit (INDEPENDENT_AMBULATORY_CARE_PROVIDER_SITE_OTHER): Payer: Medicare Other

## 2021-02-13 DIAGNOSIS — M7989 Other specified soft tissue disorders: Secondary | ICD-10-CM | POA: Diagnosis not present

## 2021-02-13 DIAGNOSIS — M25432 Effusion, left wrist: Secondary | ICD-10-CM

## 2021-02-13 DIAGNOSIS — R7303 Prediabetes: Secondary | ICD-10-CM | POA: Diagnosis not present

## 2021-02-13 DIAGNOSIS — M25532 Pain in left wrist: Secondary | ICD-10-CM

## 2021-02-13 LAB — SEDIMENTATION RATE: Sed Rate: 23 mm/hr (ref 0–30)

## 2021-02-13 LAB — HEMOGLOBIN A1C: Hgb A1c MFr Bld: 5.8 % (ref 4.6–6.5)

## 2021-02-14 ENCOUNTER — Other Ambulatory Visit: Payer: Self-pay | Admitting: Family

## 2021-02-15 LAB — ANTI-NUCLEAR AB-TITER (ANA TITER): ANA Titer 1: 1:80 {titer} — ABNORMAL HIGH

## 2021-02-15 LAB — RHEUMATOID FACTOR: Rheumatoid fact SerPl-aCnc: <14 IU/mL (ref <14)

## 2021-02-15 LAB — ANA: Anti Nuclear Antibody (ANA): POSITIVE — AB

## 2021-02-18 ENCOUNTER — Encounter: Payer: Self-pay | Admitting: Family

## 2021-02-18 ENCOUNTER — Other Ambulatory Visit: Payer: Self-pay | Admitting: Family

## 2021-02-18 MED ORDER — MELOXICAM 15 MG PO TABS: 15.0000 mg | ORAL_TABLET | Freq: Every day | ORAL | 0 refills | Status: DC

## 2021-02-19 ENCOUNTER — Other Ambulatory Visit: Payer: Self-pay | Admitting: Family

## 2021-02-19 ENCOUNTER — Other Ambulatory Visit: Payer: Self-pay | Admitting: Psychiatry

## 2021-02-19 DIAGNOSIS — F324 Major depressive disorder, single episode, in partial remission: Secondary | ICD-10-CM

## 2021-02-19 DIAGNOSIS — F419 Anxiety disorder, unspecified: Secondary | ICD-10-CM

## 2021-02-28 ENCOUNTER — Telehealth (INDEPENDENT_AMBULATORY_CARE_PROVIDER_SITE_OTHER): Payer: Medicare Other | Admitting: Psychiatry

## 2021-02-28 ENCOUNTER — Encounter: Payer: Self-pay | Admitting: Psychiatry

## 2021-02-28 DIAGNOSIS — F419 Anxiety disorder, unspecified: Secondary | ICD-10-CM

## 2021-02-28 DIAGNOSIS — F324 Major depressive disorder, single episode, in partial remission: Secondary | ICD-10-CM | POA: Diagnosis not present

## 2021-02-28 MED ORDER — ESCITALOPRAM OXALATE 10 MG PO TABS: ORAL_TABLET | ORAL | 2 refills | Status: DC

## 2021-02-28 NOTE — Progress Notes (Signed)
Brandi Walsh [id number] Jul 31, 1946 75 y.o.  Virtual Visit via Video Note  I connected with pt @ on 02/28/21 at 11:00 AM EDT by a video enabled telemedicine application and verified that I am speaking with the correct person using two identifiers.   I discussed the limitations of evaluation and management by telemedicine and the availability of in person appointments. The patient expressed understanding and agreed to proceed.  I discussed the assessment and treatment plan with the patient. The patient was provided an opportunity to ask questions and all were answered. The patient agreed with the plan and demonstrated an understanding of the instructions.   The patient was advised to call back or seek an in-person evaluation if the symptoms worsen or if the condition fails to improve as anticipated.  I provided 15 minutes of non-face-to-face time during this encounter.  The patient was located at home.  The provider was located at [facility].   [name], PMHNP   Subjective:   Patient ID:  Brandi Walsh is a 75 y.o. (DOB Nov 18, 1946) female.  Chief Complaint:  Chief Complaint  Patient presents with  . Follow-up    H/o depression and anxiety    HPI Brandi Walsh presents for follow-up of anxiety, depression, and remote h/o psychosis. Grandaughter reports that she has been gaining weight. She reports that "everything is the same or better." Denies anxiety or depression. Pt reports that she has been sleeping well. They report her energy has been good and is walking around some. She has been at home more since husband fractured his hip. They deny any AH, VH, or paranoia. Denies SI.   Consuming 2 protein shakes and fluids to help with calcium levels.   Granddaughter reports that pt's energy improved when BP med was decreased.    Review of Systems:  Review of Systems  Musculoskeletal: Negative for gait problem.       Pseudogout in one hand  Neurological:  Negative for tremors.  Psychiatric/Behavioral:       Please refer to HPI    Medications: I have reviewed the patient's current medications.  Current Outpatient Medications  Medication Sig Dispense Refill  . Cholecalciferol (VITAMIN D) 50 MCG (2000 UT) tablet Take 2,000 Units by mouth daily.    . Cholecalciferol (VITAMIN D) 50 MCG (2000 UT) tablet     . donepezil (ARICEPT) 10 MG tablet Take 1 tablet daily 90 tablet 3  . escitalopram (LEXAPRO) 10 MG tablet TAKE 1 & 1/2 (ONE & ONE-HALF) TABLETS BY MOUTH ONCE DAILY 135 tablet 2  . losartan (COZAAR) 50 MG tablet Take 1 tablet by mouth once daily 90 tablet 1  . meloxicam (MOBIC) 15 MG tablet Take 1 tablet (15 mg total) by mouth daily. 30 tablet 0  . metroNIDAZOLE (FLAGYL) 500 MG tablet Take 1 tablet (500 mg total) by mouth 2 (two) times daily. (Patient not taking: Reported on 02/28/2021) 14 tablet 0  . mupirocin ointment (BACTROBAN) 2 % Apply topically 3 (three) times daily. 30 g 1  . omeprazole (PRILOSEC) 20 MG capsule Take 1 capsule (20 mg total) by mouth daily. 90 capsule 5  . rosuvastatin (CRESTOR) 40 MG tablet Take 1 tablet by mouth once daily 90 tablet 0   No current facility-administered medications for this visit.    Medication Side Effects: None  Allergies:  Allergies  Allergen Reactions  . Lovastatin     REACTION: Rash    Past Medical History:  Diagnosis Date  . Alzheimer's disease (  HCC)   . COUGH DUE TO ACE INHIBITORS 11/25/2009   Qualifier: Diagnosis of  By: Everardo All MD, Cleophas Dunker   . Depression   . DIABETES MELLITUS, TYPE II 10/13/2007   Qualifier: Diagnosis of  By: Charlsie Quest RMA, Lucy    . HYPERLIPIDEMIA 10/13/2007   Qualifier: Diagnosis of  By: Tyrone Apple, Lucy    . HYPERTENSION 10/13/2007   Qualifier: Diagnosis of  By: Tyrone Apple, Lucy    . MENOPAUSAL SYNDROME 10/13/2007   Qualifier: Diagnosis of  By: Charlsie Quest RMA, Lucy    . OSTEOPOROSIS 10/13/2007   Qualifier: Diagnosis of  By: Samara Snide      Family History   Problem Relation Age of Onset  . Anxiety disorder Mother   . Hyperparathyroidism Sister   . Cancer Neg Hx   . Breast cancer Neg Hx     Social History   Socioeconomic History  . Marital status: Married    Spouse name: Not on file  . Number of children: Not on file  . Years of education: Not on file  . Highest education level: Not on file  Occupational History  . Occupation: Retired   Tobacco Use  . Smoking status: Never Smoker  . Smokeless tobacco: Never Used  Vaping Use  . Vaping Use: Never used  Substance and Sexual Activity  . Alcohol use: No  . Drug use: No  . Sexual activity: Yes  Other Topics Concern  . Not on file  Social History Narrative   Left Handed   Two Story Home   Lives with Husband    Drinks coffee and Diet Pepsi sometimes   Social Determinants of Health   Financial Resource Strain: Not on file  Food Insecurity: Not on file  Transportation Needs: Not on file  Physical Activity: Not on file  Stress: Not on file  Social Connections: Not on file  Intimate Partner Violence: Not on file    Past Medical History, Surgical history, Social history, and Family history were reviewed and updated as appropriate.   Please see review of systems for further details on the patient's review from today.   Objective:   Physical Exam:  Wt 124 lb (56.2 kg)   BMI 24.22 kg/m   Physical Exam Neurological:     Mental Status: She is alert. Mental status is at baseline.     Cranial Nerves: No dysarthria.  Psychiatric:        Attention and Perception: Attention and perception normal.        Mood and Affect: Mood normal.        Speech: Speech normal.        Behavior: Behavior is cooperative.        Thought Content: Thought content normal. Thought content is not paranoid or delusional. Thought content does not include homicidal or suicidal ideation. Thought content does not include homicidal or suicidal plan.        Cognition and Memory: Memory is impaired.         Judgment: Judgment normal.     Comments: Insight fair Pt calm and resting in chair.  Maintains eye contact. Affect brightens at appropriate times.     Lab Review:     Component Value Date/Time   NA 138 08/02/2020 1458   K 3.8 08/02/2020 1458   CL 102 08/02/2020 1458   CO2 28 08/02/2020 1458   GLUCOSE 135 (H) 08/02/2020 1458   BUN 14 08/02/2020 1458   CREATININE 0.73 08/02/2020 1458  CALCIUM 10.7 (H) 08/02/2020 1458   CALCIUM 10.8 (H) 02/19/2012 1129   PROT 5.9 (L) 08/02/2020 1458   ALBUMIN 3.6 07/03/2020 1732   AST 38 (H) 08/02/2020 1458   ALT 60 (H) 08/02/2020 1458   ALKPHOS 70 07/03/2020 1732   BILITOT 1.5 (H) 08/02/2020 1458   GFRNONAA >60 07/03/2020 1732   GFRAA >60 07/03/2020 1732       Component Value Date/Time   WBC 10.4 07/23/2020 1010   RBC 4.66 07/23/2020 1010   HGB 14.9 07/23/2020 1010   HGB 15.3 12/16/2010 1045   HCT 43.4 07/23/2020 1010   HCT 43.8 12/16/2010 1045   PLT 246 07/23/2020 1010   PLT 224 12/16/2010 1045   MCV 93.1 07/23/2020 1010   MCV 88.9 12/16/2010 1045   MCH 32.0 07/23/2020 1010   MCHC 34.3 07/23/2020 1010   RDW 13.3 07/23/2020 1010   RDW 12.5 12/16/2010 1045   LYMPHSABS 1,435 07/23/2020 1010   LYMPHSABS 2.1 12/16/2010 1045   MONOABS 0.7 07/03/2020 1732   MONOABS 0.8 12/16/2010 1045   EOSABS 73 07/23/2020 1010   EOSABS 0.3 12/16/2010 1045   BASOSABS 42 07/23/2020 1010   BASOSABS 0.1 12/16/2010 1045    No results found for: POCLITH, LITHIUM   No results found for: PHENYTOIN, PHENOBARB, VALPROATE, CBMZ   .res Assessment: Plan:   Continue Lexapro 15 mg po qd for anxiety and depression.  Pt to follow-up in 6-7 months or sooner if clinically indicated.  Patient advised to contact office with any questions, adverse effects, or acute worsening in signs and symptoms.  Yasheka was seen today for follow-up.  Diagnoses and all orders for this visit:  Major depressive disorder in partial remission, unspecified whether recurrent  (HCC) -     escitalopram (LEXAPRO) 10 MG tablet; TAKE 1 & 1/2 (ONE & ONE-HALF) TABLETS BY MOUTH ONCE DAILY  Anxiety disorder, unspecified type -     escitalopram (LEXAPRO) 10 MG tablet; TAKE 1 & 1/2 (ONE & ONE-HALF) TABLETS BY MOUTH ONCE DAILY     Please see After Visit Summary for patient specific instructions.  Future Appointments  Date Time Provider Department Center  09/30/2021  8:30 AM Van Clines, MD [facility] None    No orders of the defined types were placed in this encounter.     -------------------------------

## 2021-03-10 ENCOUNTER — Other Ambulatory Visit: Payer: Self-pay | Admitting: Family

## 2021-03-10 ENCOUNTER — Encounter: Payer: Self-pay | Admitting: Family

## 2021-03-10 MED ORDER — MELOXICAM 15 MG PO TABS: 15.0000 mg | ORAL_TABLET | Freq: Every day | ORAL | 0 refills | Status: DC

## 2021-03-19 ENCOUNTER — Other Ambulatory Visit: Payer: Self-pay

## 2021-03-19 ENCOUNTER — Ambulatory Visit (INDEPENDENT_AMBULATORY_CARE_PROVIDER_SITE_OTHER): Payer: Medicare Other | Admitting: Family

## 2021-03-19 ENCOUNTER — Encounter: Payer: Self-pay | Admitting: Family

## 2021-03-19 VITALS — BP 122/68 | HR 80 | Temp 98.4°F | Ht 60.0 in | Wt 123.8 lb

## 2021-03-19 DIAGNOSIS — R7989 Other specified abnormal findings of blood chemistry: Secondary | ICD-10-CM | POA: Diagnosis not present

## 2021-03-19 DIAGNOSIS — R600 Localized edema: Secondary | ICD-10-CM | POA: Diagnosis not present

## 2021-03-19 LAB — CBC
HCT: 40.7 % (ref 36.0–46.0)
Hemoglobin: 13.9 g/dL (ref 12.0–15.0)
MCHC: 34.2 g/dL (ref 30.0–36.0)
MCV: 91.8 fl (ref 78.0–100.0)
Platelets: 169 10*3/uL (ref 150.0–400.0)
RBC: 4.44 Mil/uL (ref 3.87–5.11)
RDW: 14.2 % (ref 11.5–15.5)
WBC: 7.5 10*3/uL (ref 4.0–10.5)

## 2021-03-19 LAB — COMPREHENSIVE METABOLIC PANEL
ALT: 67 U/L — ABNORMAL HIGH (ref 0–35)
AST: 36 U/L (ref 0–37)
Albumin: 3.4 g/dL — ABNORMAL LOW (ref 3.5–5.2)
Alkaline Phosphatase: 96 U/L (ref 39–117)
BUN: 14 mg/dL (ref 6–23)
CO2: 27 mEq/L (ref 19–32)
Calcium: 10 mg/dL (ref 8.4–10.5)
Chloride: 106 mEq/L (ref 96–112)
Creatinine, Ser: 0.57 mg/dL (ref 0.40–1.20)
GFR: 89.01 mL/min (ref >60.00)
Glucose, Bld: 91 mg/dL (ref 70–99)
Potassium: 3.6 mEq/L (ref 3.5–5.1)
Sodium: 139 mEq/L (ref 135–145)
Total Bilirubin: 1 mg/dL (ref 0.2–1.2)
Total Protein: 6.5 g/dL (ref 6.0–8.3)

## 2021-03-19 LAB — BRAIN NATRIURETIC PEPTIDE: Pro B Natriuretic peptide (BNP): 126 pg/mL — ABNORMAL HIGH (ref 0.0–100.0)

## 2021-03-19 MED ORDER — MELOXICAM 15 MG PO TABS: 15.0000 mg | ORAL_TABLET | Freq: Every day | ORAL | 1 refills | Status: DC

## 2021-03-19 NOTE — Progress Notes (Signed)
Brandi Walsh is a 75 y.o. female with the following history as recorded in EpicCare:  Patient Active Problem List   Diagnosis Date Noted  . 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  . Abnormal liver enzymes 09/19/2019  . Blurred vision 09/19/2019  . Hypercalcemia 03/15/2019  . Major depression in full remission ([place]) 11/24/2018  . Memory loss 12/24/2016  . Encounter for well adult exam with abnormal findings 06/13/2013  . Hyperparathyroidism, primary ([place]) 06/12/2013  . GERD (gastroesophageal reflux disease) 02/19/2012  . 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  . COUGH DUE TO ACE INHIBITORS 11/25/2009  . Diabetes ([place]) 10/13/2007  . Dyslipidemia 10/13/2007  . Essential hypertension 10/13/2007  . MENOPAUSAL SYNDROME 10/13/2007  . Osteoporosis 10/13/2007    Current Outpatient Medications  Medication Sig Dispense Refill  . Cholecalciferol (VITAMIN D) 50 MCG (2000 UT) tablet Take 2,000 Units by mouth daily.    Marland Kitchen donepezil (ARICEPT) 10 MG tablet Take 1 tablet daily 90 tablet 3  . escitalopram (LEXAPRO) 10 MG tablet TAKE 1 & 1/2 (ONE & ONE-HALF) TABLETS BY MOUTH ONCE DAILY 135 tablet 2  . losartan (COZAAR) 50 MG tablet Take 1 tablet by mouth once daily 90 tablet 1  . mupirocin ointment (BACTROBAN) 2 % Apply topically 3 (three) times daily. 30 g 1  . omeprazole (PRILOSEC) 20 MG capsule Take 1 capsule (20 mg total) by mouth daily. 90 capsule 5  . rosuvastatin (CRESTOR) 40 MG tablet Take 1 tablet by mouth once daily 90 tablet 0  . meloxicam (MOBIC) 15 MG tablet Take 1 tablet (15 mg total) by mouth daily. 90 tablet 1   No current facility-administered medications for this visit.    Allergies: Lovastatin  Past Medical History:  Diagnosis Date  . Alzheimer's disease ([place])   . COUGH DUE TO ACE INHIBITORS 11/25/2009   Qualifier: Diagnosis of  By: [name] MD, [name]   . Depression   . DIABETES MELLITUS, TYPE II 10/13/2007   Qualifier: Diagnosis of  By: Marca Ancona RMA, Lucy    . HYPERLIPIDEMIA 10/13/2007   Qualifier:  Diagnosis of  By: Reatha Armour, Lucy    . HYPERTENSION 10/13/2007   Qualifier: Diagnosis of  By: Reatha Armour, Lucy    . MENOPAUSAL SYNDROME 10/13/2007   Qualifier: Diagnosis of  By: Marca Ancona RMA, Lucy    . OSTEOPOROSIS 10/13/2007   Qualifier: Diagnosis of  By: Larose Kells      Past Surgical History:  Procedure Laterality Date  . ABDOMINAL HYSTERECTOMY    . BREAST BIOPSY  08/2002  . TONSILLECTOMY      Family History  Problem Relation Age of Onset  . Anxiety disorder Mother   . Hyperparathyroidism Sister   . Cancer Neg Hx   . Breast cancer Neg Hx     Social History   Tobacco Use  . Smoking status: Never Smoker  . Smokeless tobacco: Never Used  Substance Use Topics  . Alcohol use: No    Subjective:   Accompanied by husband; known history of dementia- limited participation in visit by patient; granddaughter is on the phone to help provide history; Continue concerns about swelling in left hand; symptoms x 2-3 months; normal dopplers and X-rays; saw rheumatologist today- not concerns for CRPD at this time; recommended to get vascular referral; endocrinologist also asking if CMP can be checked in preparation for upcoming visit for follow-up on elevated calcium;    Objective:  Vitals:   03/19/21 1041  BP: 122/68  Pulse: 80  Temp: 98.4 F (36.9 C)  TempSrc: Oral  SpO2: 96%  Weight: 123 lb 12.8 oz (56.2 kg)  Height: 5' (1.524 m)    General: Well developed, well nourished, in no acute distress  Skin : Warm and dry.  Head: Normocephalic and atraumatic  Lungs: Respirations unlabored;  Extremities: left upper extremity edema/ edema noted in lower extremities- not pitting, cyanosis, clubbing  Vessels: Symmetric bilaterally  Neurologic: Alert and oriented; speech intact; face symmetrical; moves all extremities well; CNII-XII intact without focal deficit   Assessment:  1. Pedal edema   2. Elevated LFTs     Plan:  Refer to vascular specialist; check CBC, CMP, BNP today; refill  given on Mobic as requested;  This visit occurred during the SARS-CoV-2 public health emergency.  Safety protocols were in place, including screening questions prior to the visit, additional usage of staff PPE, and extensive cleaning of exam room while observing appropriate contact time as indicated for disinfecting solutions.     No follow-ups on file.  Orders Placed This Encounter  Procedures  . Comp Met (CMET)    Standing Status:   Future    Number of Occurrences:   1    Standing Expiration Date:   03/19/2022  . CBC    Standing Status:   Future    Number of Occurrences:   1    Standing Expiration Date:   03/19/2022  . Brain natriuretic peptide    Standing Status:   Future    Number of Occurrences:   1    Standing Expiration Date:   03/19/2022  . Ambulatory referral to Vascular Surgery    Referral Priority:   Routine    Referral Type:   Surgical    Referral Reason:   Specialty Services Required    Requested Specialty:   Vascular Surgery    Number of Visits Requested:   1    Requested Prescriptions   Signed Prescriptions Disp Refills  . meloxicam (MOBIC) 15 MG tablet 90 tablet 1    Sig: Take 1 tablet (15 mg total) by mouth daily.

## 2021-03-26 ENCOUNTER — Other Ambulatory Visit: Payer: Self-pay

## 2021-03-26 DIAGNOSIS — G20 Parkinson's disease: Secondary | ICD-10-CM

## 2021-03-26 DIAGNOSIS — F0281 Dementia in other diseases classified elsewhere with behavioral disturbance: Secondary | ICD-10-CM

## 2021-03-26 DIAGNOSIS — F02818 Dementia in other diseases classified elsewhere, unspecified severity, with other behavioral disturbance: Secondary | ICD-10-CM

## 2021-03-29 ENCOUNTER — Other Ambulatory Visit: Payer: Self-pay

## 2021-03-29 DIAGNOSIS — R600 Localized edema: Secondary | ICD-10-CM

## 2021-04-01 ENCOUNTER — Ambulatory Visit: Payer: Medicare Other | Admitting: Vascular Surgery

## 2021-04-01 ENCOUNTER — Encounter: Payer: Self-pay | Admitting: Vascular Surgery

## 2021-04-01 ENCOUNTER — Other Ambulatory Visit: Payer: Self-pay

## 2021-04-01 ENCOUNTER — Ambulatory Visit (HOSPITAL_COMMUNITY)
Admission: RE | Admit: 2021-04-01 | Discharge: 2021-04-01 | Disposition: A | Discharge status: Home / Self Care | Payer: Medicare Other | Source: Ambulatory Visit | Attending: Vascular Surgery | Admitting: Vascular Surgery

## 2021-04-01 VITALS — BP 114/79 | HR 72 | Temp 97.5°F | Resp 16 | Ht 60.0 in | Wt 123.0 lb

## 2021-04-01 DIAGNOSIS — R600 Localized edema: Secondary | ICD-10-CM

## 2021-04-01 DIAGNOSIS — R609 Edema, unspecified: Secondary | ICD-10-CM

## 2021-04-01 NOTE — Progress Notes (Signed)
[facility] [place]  ASSESSMENT / PLAN: 75 y.o. female with peripheral edema in upper and lower extremities. Patient with dementia and not able to participate in venous reflux study today. Elevated BNP suggests a possible cardiac source for peripheral edema, an echocardiogram may be helpful. Recommend compression, elevation. Follow up with me in 3 months with reflux study.   CHIEF COMPLAINT: foot swelling  HISTORY OF PRESENT ILLNESS: Brandi Walsh is a 75 y.o. female referred to clinic for evaluation of pedal edema.  Patient has Alzheimer's dementia and cannot provide much history.  Her son is with her today.  He notes swelling that progresses throughout the day in her lower extremities and upper extremities.  Swelling seems to improve with elevation.  The patient has not been able to tolerate compression very well because of her dementia.  Past Medical History:  Diagnosis Date  . Alzheimer's disease (HCC)   . COUGH DUE TO ACE INHIBITORS 11/25/2009   Qualifier: Diagnosis of  By: Everardo All MD, Cleophas Dunker   . Depression   . DIABETES MELLITUS, TYPE II 10/13/2007   Qualifier: Diagnosis of  By: Charlsie Quest RMA, Lucy    . HYPERLIPIDEMIA 10/13/2007   Qualifier: Diagnosis of  By: Tyrone Apple, Lucy    . HYPERTENSION 10/13/2007   Qualifier: Diagnosis of  By: Tyrone Apple, Lucy    . MENOPAUSAL SYNDROME 10/13/2007   Qualifier: Diagnosis of  By: Charlsie Quest RMA, Lucy    . OSTEOPOROSIS 10/13/2007   Qualifier: Diagnosis of  By: Samara Snide      Past Surgical History:  Procedure Laterality Date  . ABDOMINAL HYSTERECTOMY    . BREAST BIOPSY  08/2002  . TONSILLECTOMY      Family History  Problem Relation Age of Onset  . Anxiety disorder Mother   . Hyperparathyroidism Sister   . Cancer Neg Hx   . Breast cancer Neg Hx     Social History   Socioeconomic History  . Marital status: Married    Spouse name: Not on file  . Number of children: Not on file  . Years of education: Not on  file  . Highest education level: Not on file  Occupational History  . Occupation: Retired   Tobacco Use  . Smoking status: Never Smoker  . Smokeless tobacco: Never Used  Vaping Use  . Vaping Use: Never used  Substance and Sexual Activity  . Alcohol use: No  . Drug use: No  . Sexual activity: Yes  Other Topics Concern  . Not on file  Social History Narrative   Left Handed   Two Story Home   Lives with Husband    Drinks coffee and Diet [employer]epsi sometimes   Social Determinants of Health   Financial Resource Strain: Not on file  Food Insecurity: Not on file  Transportation Needs: Not on file  Physical Activity: Not on file  Stress: Not on file  Social Connections: Not on file  Intimate Partner Violence: Not on file    Allergies  Allergen Reactions  . Lovastatin     REACTION: Rash    Current Outpatient Medications  Medication Sig Dispense Refill  . alendronate (FOSAMAX) 70 MG tablet Take 70 mg by mouth once a week.    . Cholecalciferol (VITAMIN D) 50 MCG (2000 UT) tablet Take 2,000 Units by mouth daily.    Marland Kitchen donepezil (ARICEPT) 10 MG tablet Take 1 tablet daily 90 tablet 3  . escitalopram (LEXAPRO) 10 MG tablet TAKE 1 & 1/2 (  ONE & ONE-HALF) TABLETS BY MOUTH ONCE DAILY 135 tablet 2  . losartan (COZAAR) 50 MG tablet Take 1 tablet by mouth once daily 90 tablet 1  . meloxicam (MOBIC) 15 MG tablet Take 1 tablet (15 mg total) by mouth daily. 90 tablet 1  . mupirocin ointment (BACTROBAN) 2 % Apply topically 3 (three) times daily. 30 g 1  . omeprazole (PRILOSEC) 20 MG capsule Take 1 capsule (20 mg total) by mouth daily. 90 capsule 5  . rosuvastatin (CRESTOR) 40 MG tablet Take 1 tablet by mouth once daily 90 tablet 0   No current facility-administered medications for this visit.    REVIEW OF SYSTEMS:  [X]  denotes positive finding, [ ]  denotes negative finding Cardiac  Comments:  Chest pain or chest pressure:    Shortness of breath upon exertion:    Short of breath when lying  flat:    Irregular heart rhythm:        Vascular    Pain in calf, thigh, or hip brought on by ambulation:    Pain in feet at night that wakes you up from your sleep:     Blood clot in your veins:    Leg swelling:         Pulmonary    Oxygen at home:    Productive cough:     Wheezing:         Neurologic    Sudden weakness in arms or legs:     Sudden numbness in arms or legs:     Sudden onset of difficulty speaking or slurred speech:    Temporary loss of vision in one eye:     Problems with dizziness:         Gastrointestinal    Blood in stool:     Vomited blood:         Genitourinary    Burning when urinating:     Blood in urine:        Psychiatric    Major depression:         Hematologic    Bleeding problems:    Problems with blood clotting too easily:        Skin    Rashes or ulcers:        Constitutional    Fever or chills:      PHYSICAL EXAM  Vitals:   04/01/21 1442  BP: 114/79  Pulse: 72  Resp: 16  Temp: (!) 97.5 F (36.4 C)  SpO2: 99%  Weight: 123 lb (55.8 kg)  Height: 5' (1.524 m)    Constitutional: chronically ill appearing. no distress. Appears well nourished.  Neurologic: CN intact. no focal findings. no sensory loss. Psychiatric: Mood and affect symmetric and appropriate. Eyes: No icterus. No conjunctival pallor. Ears, nose, throat: mucous membranes moist. Midline trachea.  Cardiac: regular rate and rhythm.  Respiratory: unlabored. Abdominal: soft, non-tender, non-distended.  Peripheral vascular:  2+ edema to the calf bilateral lower extremities  2+ radial pulses  2+ DP pulses Extremity: No cyanosis. No pallor.  Skin: No gangrene. No ulceration.  Lymphatic: No Stemmer's sign. No palpable lymphadenopathy.  PERTINENT LABORATORY AND RADIOLOGIC DATA  Most recent CBC CBC Latest Ref Rng & Units 03/19/2021 07/23/2020 07/03/2020  WBC 4.0 - 10.5 K/uL 7.5 10.4 8.4  Hemoglobin 12.0 - 15.0 g/dL 07/25/2020 09/02/2020 60.7  Hematocrit 36.0 - 46.0 % 40.7 43.4  41.1  Platelets 150.0 - 400.0 K/uL 169.0 246 195     Most recent CMP CMP Latest Ref  Rng & Units 03/19/2021 08/02/2020 07/23/2020  Glucose 70 - 99 mg/dL 91 034(V) 425(Z)  BUN 6 - 23 mg/dL 14 14 14   Creatinine 0.40 - 1.20 mg/dL 5.63 8.75)  Sodium 135 - 145 mEq/L 139 138 137  Potassium 3.5 - 5.1 mEq/L 3.6 3.8 3.3(L)  Chloride 96 - 112 mEq/L 106 102 97(L)  CO2 19 - 32 mEq/L 27 28 30   Calcium 8.4 - 10.5 mg/dL 6.43(P 10.7(H) 10.2  Total Protein 6.0 - 8.3 g/dL 6.5 5.9(L) 6.1  Total Bilirubin 0.2 - 1.2 mg/dL 1.0 ) 1.1  Alkaline Phos 39 - 117 U/L 96 - -  AST 0 - 37 U/L 36 38(H) 27  ALT 0 - 35 U/L 67(H) 60(H) 33(H)    Renal function Estimated Creatinine Clearance: 47.6 mL/min (by C-G formula based on SCr of 0.57 mg/dL).  Hgb A1c MFr Bld (%)  Date Value  02/13/2021 5.8    LDL Cholesterol (Calc)  Date Value Ref Range Status  08/02/2020 59 mg/dL (calc) Final    Comment:    Reference range: <100 . Desirable range <100 mg/dL for primary prevention;   <70 mg/dL for patients with CHD or diabetic patients  with > or = 2 CHD risk factors. 02/15/2021 LDL-C is now calculated using the Martin-Hopkins  calculation, which is a validated novel method providing  better accuracy than the Friedewald equation in the  estimation of LDL-C.  10/02/2020 et al. Marland Kitchen. Horald Pollen): 2061-2068  (http://education.[facility].com/faq/FAQ164)    Direct LDL  Date Value Ref Range Status  06/12/2013 61.8 mg/dL Final    Comment:    Optimal:  <100 mg/dLNear or Above Optimal:  100-129 mg/dLBorderline High:  130-159 mg/dLHigh:  160-189 mg/dLVery High:  >190 mg/dL     Vascular Imaging: Patient unable to complete venous reflux study  08-09-1971. 06/14/2013, MD [facility] Phone Number: [phone] 04/01/2021 2:59 PM

## 2021-04-03 ENCOUNTER — Other Ambulatory Visit: Payer: Self-pay

## 2021-04-03 DIAGNOSIS — R609 Edema, unspecified: Secondary | ICD-10-CM

## 2021-05-14 ENCOUNTER — Other Ambulatory Visit: Payer: Self-pay | Admitting: Family

## 2021-05-21 ENCOUNTER — Telehealth: Payer: Self-pay

## 2021-05-21 NOTE — Telephone Encounter (Signed)
Patient's granddaughter calls today to ask about getting patient's duplexes sooner. Through further discussion, it sounds like the endocrinologist wants studies prior to treating her osteoporosis, but the "studies have something to do with her heart." Patient was seen by our office in May for a LE reflux study requested by Allyne Gee. An UE was not requested that I can find. I see an UE study was done by [facility] in March that showed no DVT. Patient was unable to cooperate for a duplex in May due to dementia. Advised granddaughter to call endocrinologist and determine what studies were actually needed. Told her to call back if it was the LE reflux that she needed and we would try and get her in sooner, but if it was a different study- we would leave her appt where it was.

## 2021-05-27 ENCOUNTER — Telehealth: Payer: Self-pay | Admitting: Physical Therapy

## 2021-05-27 ENCOUNTER — Ambulatory Visit: Payer: Medicare Other | Attending: Neurology | Admitting: Physical Therapy

## 2021-05-27 ENCOUNTER — Other Ambulatory Visit: Payer: Self-pay

## 2021-05-27 DIAGNOSIS — R2681 Unsteadiness on feet: Secondary | ICD-10-CM | POA: Diagnosis present

## 2021-05-27 DIAGNOSIS — M6281 Muscle weakness (generalized): Secondary | ICD-10-CM

## 2021-05-27 DIAGNOSIS — R293 Abnormal posture: Secondary | ICD-10-CM

## 2021-05-27 DIAGNOSIS — R2689 Other abnormalities of gait and mobility: Secondary | ICD-10-CM | POA: Diagnosis not present

## 2021-05-27 NOTE — Patient Instructions (Signed)
Access Code: [email or web address] URL: [email or web address] Date: 05/27/2021 Prepared by: Lonia Blood  Exercises Seated Long Arc Quad - 1-2 x daily - 7 x weekly - 1-2 sets - 10 reps Seated Ankle Pumps on Table - 1-2 x daily - 7 x weekly - 1-2 sets - 10 reps Seated Hamstring Stretch - 1-2 x daily - 7 x weekly - 1 sets - 3 reps - 30 sec hold

## 2021-05-27 NOTE — Telephone Encounter (Signed)
Pls see below and pls send requested order for OT evaluation and treat for upper extremity weakness, decreased coordination, thank you!

## 2021-05-27 NOTE — Therapy (Signed)
[facility] [address] [place], [place], [address] Phone: [phone]   Fax:  [phone]  Physical Therapy Evaluation  Patient Details  Name: Brandi Walsh MRN: [id number] Date of Birth: 1946-08-15 Referring Provider (PT): Patrcia Dolly   Encounter Date: 05/27/2021   PT End of Session - 05/27/21 0800     Visit Number 1    Number of Visits 16    Date for PT Re-Evaluation 07/18/21    Authorization Type U[employer] medicare    Progress Note Due on Visit 10    PT Start Time 0800    PT Stop Time 0847    PT Time Calculation (min) 47 min    Equipment Utilized During Treatment Gait belt    Activity Tolerance Patient tolerated treatment well    Behavior During Therapy [facility] for tasks assessed/performed             Past Medical History:  Diagnosis Date   Alzheimer's disease (HCC)    COUGH DUE TO ACE INHIBITORS 11/25/2009   Qualifier: Diagnosis of  By: Everardo All MD, Sean A    Depression    DIABETES MELLITUS, TYPE II 10/13/2007   Qualifier: Diagnosis of  By: Samara Snide     HYPERLIPIDEMIA 10/13/2007   Qualifier: Diagnosis of  By: Samara Snide     HYPERTENSION 10/13/2007   Qualifier: Diagnosis of  By: Samara Snide     MENOPAUSAL SYNDROME 10/13/2007   Qualifier: Diagnosis of  By: Charlsie Quest RMA, Lucy     OSTEOPOROSIS 10/13/2007   Qualifier: Diagnosis of  By: Samara Snide      Past Surgical History:  Procedure Laterality Date   ABDOMINAL HYSTERECTOMY     BREAST BIOPSY  08/2002   TONSILLECTOMY      There were no vitals filed for this visit.    Subjective Assessment - 05/27/21 0800     Subjective Have trouble with my legs-walking.  Have trouble wtih my arms.  Granddaughter reports difficulty with strength, slower movements, going on about 2 years now.  No recent falls, but has had some in the distant past.  Has some swelling that MD is aware of; she wears compression stockings several days a week when  caregiver comes.  Does not use device.    Patient is accompained by: Family member   granddaughter   Pertinent History MH:  Alzheimer's, HTN, HLD, depression, anxiety, osteoporosis    Patient Stated Goals Pt's goals are to walk better.    Currently in Pain? Yes    Pain Score 5     Pain Location Leg    Pain Orientation Right;Left    Pain Descriptors / Indicators Aching    Pain Type Chronic pain    Pain Onset More than a month ago    Pain Frequency Constant    Aggravating Factors  Too much activity    Pain Relieving Factors sitting    Effect of Pain on Daily Activities PT will monitor, but not address specifically as a goal at this time.                [facility] PT Assessment - 05/27/21 0810       Assessment   Medical Diagnosis Parkinsonism    Referring Provider (PT) Patrcia Dolly    Onset Date/Surgical Date 03/26/21   MD visit   Hand Dominance Left    Prior Therapy late summer 2021, HHPT      Precautions   Precautions Fall  Balance Screen   Has the patient fallen in the past 6 months No    Has the patient had a decrease in activity level because of a fear of falling?  Yes    Is the patient reluctant to leave their home because of a fear of falling?  Yes      Home Environment   Living Environment Private residence    Living Arrangements Spouse/significant other    Available Help at Discharge Personal care attendant   3x/wk for 3 hrs, caregiver assist   Type of Home House    Home Access Stairs to enter    Entrance Stairs-Number of Steps 4    Entrance Stairs-Rails Right    Home Layout One level    Home Equipment Grab bars - tub/shower;Grab bars - toilet;Other (comment)   Lift chair     Prior Function   Level of Independence Needs assistance with ADLs;Needs assistance with gait    Vocation Retired    Leisure Enjoys being outdoors, reading, walking      Observation/Other Assessments   Focus on Therapeutic Outcomes (FOTO)  NA      Posture/Postural Control    Posture/Postural Control Postural limitations    Postural Limitations Forward head    Posture Comments L shoulder higher than R      ROM / Strength   AROM / PROM / Strength AROM;Strength      AROM   Overall AROM  Deficits    Overall AROM Comments Not full knee extension with LAQ initially, able to do with cues.  In standing, pt flexed at hips and at knees.      Strength   Overall Strength Deficits    Strength Assessment Site Hip;Knee;Ankle    Right/Left Hip Right;Left    Right Hip Flexion 4/5    Left Hip Flexion 4/5    Right/Left Knee Right;Left    Right Knee Flexion 4+/5    Right Knee Extension 4+/5    Left Knee Flexion 4+/5    Left Knee Extension 4+/5    Right/Left Ankle Right;Left    Right Ankle Dorsiflexion 4/5    Left Ankle Dorsiflexion 4+/5      Transfers   Transfers Sit to Stand;Stand to Sit    Sit to Stand 5: Supervision;With upper extremity assist;From chair/3-in-1    Five time sit to stand comments  25.03    Stand to Sit 5: Supervision;With upper extremity assist;To chair/3-in-1      Ambulation/Gait   Ambulation/Gait Yes    Ambulation/Gait Assistance 4: Min guard;5: Supervision    Ambulation Distance (Feet) 100 Feet    Assistive device None    Gait Pattern Step-through pattern;Decreased arm swing - right;Decreased arm swing - left;Step-to pattern;Decreased step length - right;Decreased step length - left;Decreased dorsiflexion - right;Decreased dorsiflexion - left;Right flexed knee in stance;Left flexed knee in stance;Trunk flexed;Wide base of support;Poor foot clearance - left;Poor foot clearance - right    Ambulation Surface Level;Indoor    Gait velocity 40.19 sec = 0.82 ft/sec      Standardized Balance Assessment   Standardized Balance Assessment Timed Up and Go Test      Timed Up and Go Test   Normal TUG (seconds) 51.22    TUG Comments Scores >13.5 sec indicate increased fall risk; >30 sec indicates increased difficulty with ADLs in the home.      High  Level Balance   High Level Balance Comments SLS:  LLE:  4.34 sec, RLE 1.91 sec with trying  to recover, if pt was unaided by therapist, she would have fallen.  No balance reaction noted.Tandem stance LLE posterior 27 seconds, RLE posterior 30 seconds, pt with increased sway, increased forward trunk posture, close supervision throughout.                        Objective measurements completed on examination: See above findings.               PT Education - 05/27/21 0857     Education Details PT POC, benefit of OT eval; initiated HEP    Person(s) Educated Patient;Other (comment)   granddaughter   Methods Explanation;Demonstration;Handout    Comprehension Verbalized understanding;Returned demonstration              PT Short Term Goals - 05/27/21 0905       PT SHORT TERM GOAL #1   Title Pt will perform HEP with family supervision for improved strength, balance, transfers, and gait.  TARGET 06/20/2021    Time 4    Period Weeks    Status New      PT SHORT TERM GOAL #2   Title Pt will improve 5x sit<>stand to less than or equal to 20 sec to demonstrate improved functional strength and transfer efficiency.    Baseline 25.03 sec    Time 4    Period Weeks    Status New      PT SHORT TERM GOAL #3   Title Pt will verbalize understanding of fall prevention in home environment.    Time 4    Period Weeks    Status New      PT SHORT TERM GOAL #4   Title Pt will improve TUG score to less than or equal to 40 sec for decreased fall risk.    Baseline 51.22 sec    Time 8    Period Weeks    Status New               PT Long Term Goals - 05/27/21 0906       PT LONG TERM GOAL #1   Title Pt will perform progression of HEP with family supervision for improved strength, balance, transfers, and gait.  TARGET 07/18/2021    Time 8    Period Weeks    Status New      PT LONG TERM GOAL #2   Title Pt will improve 5x sit<>stand to less than or equal to 15 sec to  demonstrate improved functional strength and transfer efficiency.    Time 8    Period Weeks    Status New      PT LONG TERM GOAL #3   Title Pt will improve gait velocity to at least 1.3 ft/sefc for improved gait efficiency and safety.    Baseline 0.82 ft/sec    Time 8    Period Weeks    Status New      PT LONG TERM GOAL #4   Title Pt will improve TUG score to less than or equal to 30 sec for decreased fall risk.    Baseline 51.22 sec    Time 8    Period Weeks    Status New      PT LONG TERM GOAL #5   Title Pt will ambulate at least 500 ft, supervision, outdoor surfaces, with appropriat assistive device as needed, for improved community gait.    Time 8    Period Weeks    Status New  Plan - 05/27/21 0759     Clinical Impression Statement Pt is a 75 year old female with history of Parkinsonism, late onset Alzheimer's, who presents to OPPT with reports of decreased strength and difficulty walking.  She presents with decreased strength, decreased balance, postural instability, decreased timing and coordination with gait, abnormal posture.  Pt is at fall risk per TUG and gait velocity scores; pt's gait velocity indicates household ambulator status.  Pt demo decreased functional strength with 5x sit<>stand test.  Pt enjoys walking in the neighborhood and being outdoors; granddaughter feels her overall activity level has decreased in the past 1-2 years.  She would benefit from skilled PT to address the above stated deficits to decrease fall risk and improve overall functional mobility.    Personal Factors and Comorbidities Comorbidity 3+    Comorbidities PMH:  Alzheimer's, HTN, HLD, depression, anxiety, osteoporosis    Examination-Activity Limitations Locomotion Level;Transfers;Stairs;Stand    Examination-Participation Restrictions Community Activity;Other   Walking in the neighborhood   Stability/Clinical Decision Making Evolving/Moderate complexity    Clinical  Decision Making Moderate    Rehab Potential Good    PT Frequency 2x / week    PT Duration 8 weeks   including eval week   PT Treatment/Interventions ADLs/Self Care Home Management;Gait training;Stair training;Functional mobility training;Therapeutic activities;Therapeutic exercise;Balance training;Neuromuscular re-education;Patient/family education;Passive range of motion;Manual techniques    PT Next Visit Plan Review initial HEP, progress HEP for strength and balance; work on gait for increased step length and arm swing (?Use UpWalker in therapy session to help with posture, step length); use of seated stepper/NuStep for flexibility and strength    PT Home Exercise Plan Access Code: [email or web address]    Consulted and Agree with Plan of Care Patient;Family member/caregiver    Family Member Consulted granddaughter             Patient will benefit from skilled therapeutic intervention in order to improve the following deficits and impairments:  Abnormal gait, Difficulty walking, Decreased balance, Impaired flexibility, Decreased mobility, Decreased strength, Postural dysfunction  Visit Diagnosis: Other abnormalities of gait and mobility  Unsteadiness on feet  Muscle weakness (generalized)  Abnormal posture     Problem List Patient Active Problem List   Diagnosis Date Noted   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 05/27/2021, 9:10  AM Gean MaidensMARRIOTT,Keayra Graham W., PT   [facility] [address] [place], [place], [address] Phone: [phone]   Fax:  [phone]  Name: Brandi Walsh MRN: [id number] Date of Birth: May 29, 1946

## 2021-05-27 NOTE — Telephone Encounter (Signed)
Dr. Karel Jarvis, I evaluated Brandi Walsh today for physical therapy, and based on reports of upper extremity weakness and shakiness, decreased coordination, she would benefit from occupational therapy as well.  If you agree, could you please send order via Epic for OT evaluation and treat?  Thank you.  Lonia Blood, PT 05/27/21 9:14 AM Phone: [phone] Fax: [phone]

## 2021-05-28 ENCOUNTER — Other Ambulatory Visit: Payer: Self-pay

## 2021-05-28 DIAGNOSIS — R29898 Other symptoms and signs involving the musculoskeletal system: Secondary | ICD-10-CM

## 2021-05-28 NOTE — Telephone Encounter (Signed)
Order placed in epic

## 2021-06-10 ENCOUNTER — Ambulatory Visit: Payer: Medicare Other | Attending: Neurology | Admitting: Physical Therapy

## 2021-06-10 ENCOUNTER — Ambulatory Visit: Payer: Medicare Other | Admitting: Occupational Therapy

## 2021-06-10 ENCOUNTER — Other Ambulatory Visit: Payer: Self-pay

## 2021-06-10 ENCOUNTER — Encounter: Payer: Self-pay | Admitting: Physical Therapy

## 2021-06-10 DIAGNOSIS — R29818 Other symptoms and signs involving the nervous system: Secondary | ICD-10-CM | POA: Insufficient documentation

## 2021-06-10 DIAGNOSIS — R29898 Other symptoms and signs involving the musculoskeletal system: Secondary | ICD-10-CM

## 2021-06-10 DIAGNOSIS — R600 Localized edema: Secondary | ICD-10-CM | POA: Diagnosis present

## 2021-06-10 DIAGNOSIS — R2681 Unsteadiness on feet: Secondary | ICD-10-CM

## 2021-06-10 DIAGNOSIS — M6281 Muscle weakness (generalized): Secondary | ICD-10-CM | POA: Insufficient documentation

## 2021-06-10 DIAGNOSIS — R2689 Other abnormalities of gait and mobility: Secondary | ICD-10-CM | POA: Insufficient documentation

## 2021-06-10 DIAGNOSIS — R293 Abnormal posture: Secondary | ICD-10-CM

## 2021-06-10 DIAGNOSIS — R41840 Attention and concentration deficit: Secondary | ICD-10-CM

## 2021-06-10 DIAGNOSIS — R278 Other lack of coordination: Secondary | ICD-10-CM

## 2021-06-10 NOTE — Therapy (Signed)
[facility] [address] [place], [place], [address] Phone: [phone]   Fax:  [phone]  Physical Therapy Treatment  Patient Details  Name: Brandi Walsh MRN: [id number] Date of Birth: Nov 17, 1946 Referring Provider (PT): Patrcia Dolly   Encounter Date: 06/10/2021   PT End of Session - 06/10/21 0758     Visit Number 2    Number of Visits 16    Date for PT Re-Evaluation 07/18/21    Authorization Type UHC medicare    Progress Note Due on Visit 10    PT Start Time 0802    PT Stop Time 0843    PT Time Calculation (min) 41 min    Equipment Utilized During Treatment Gait belt    Activity Tolerance Patient tolerated treatment well    Behavior During Therapy [facility] for tasks assessed/performed             Past Medical History:  Diagnosis Date   Alzheimer's disease (HCC)    COUGH DUE TO ACE INHIBITORS 11/25/2009   Qualifier: Diagnosis of  By: Everardo All MD, Sean A    Depression    DIABETES MELLITUS, TYPE II 10/13/2007   Qualifier: Diagnosis of  By: Samara Snide     HYPERLIPIDEMIA 10/13/2007   Qualifier: Diagnosis of  By: Samara Snide     HYPERTENSION 10/13/2007   Qualifier: Diagnosis of  By: Samara Snide     MENOPAUSAL SYNDROME 10/13/2007   Qualifier: Diagnosis of  By: Charlsie Quest RMA, Lucy     OSTEOPOROSIS 10/13/2007   Qualifier: Diagnosis of  By: Samara Snide      Past Surgical History:  Procedure Laterality Date   ABDOMINAL HYSTERECTOMY     BREAST BIOPSY  08/2002   TONSILLECTOMY      There were no vitals filed for this visit.   Subjective Assessment - 06/10/21 0758     Subjective No falls, no changes since eval.    Patient is accompained by: Family member   granddaughter   Pertinent History MH:  Alzheimer's, HTN, HLD, depression, anxiety, osteoporosis    Patient Stated Goals Pt's goals are to walk better.    Currently in Pain? Yes    Pain Score 5     Pain Location Leg    Pain Orientation  Left    Pain Descriptors / Indicators Aching    Pain Type Chronic pain    Pain Onset More than a month ago    Pain Frequency Constant    Aggravating Factors  too much activity    Pain Relieving Factors sitting                      Access Code: [email or web address] URL: [email or web address] Date: 05/27/2021 Prepared by: Lonia Blood   Exercises-Reviewed HEP from last visit Seated Long Arc Quad - 1-2 x daily - 7 x weekly - 1-2 sets - 10 reps Seated Ankle Pumps on Table - 1-2 x daily - 7 x weekly - 1-2 sets - 10 reps Seated Hamstring Stretch - 1-2 x daily - 7 x weekly - 1 sets - 3 reps - 30 sec hold            [facility] Adult PT Treatment/Exercise - 06/10/21 0001       Transfers   Transfers Sit to Stand;Stand to Sit    Sit to Stand 5: Supervision;With upper extremity assist;From bed    Stand to Sit 5: Supervision;With upper extremity assist;To bed  Comments At least 5 reps through session      Ambulation/Gait   Ambulation/Gait Yes    Ambulation/Gait Assistance 4: Min guard;5: Supervision    Ambulation Distance (Feet) 100 Feet   115 x 2 with UpWalker   Assistive device None;Other (Comment)   UpWalker   Gait Pattern Step-through pattern;Decreased arm swing - right;Decreased arm swing - left;Step-to pattern;Decreased step length - right;Decreased step length - left;Decreased dorsiflexion - right;Decreased dorsiflexion - left;Right flexed knee in stance;Left flexed knee in stance;Trunk flexed;Wide base of support;Poor foot clearance - left;Poor foot clearance - right    Ambulation Surface Level;Indoor    Gait Comments Trialed UpWalker, UE's positioned at level 7-8, to facilitate improved upright posture.  Cues for increased step length.      Exercises   Exercises Knee/Hip      Knee/Hip Exercises: Standing   Heel Raises Both;1 set;10 reps;Limitations    Heel Raises Limitations Heel raises/toe raises; limited in toe raises    Knee Flexion  Strengthening;Right;Left;5 reps;1 set    Hip Flexion Stengthening;Right;Left;1 set;10 reps;Knee bent;Limitations    Hip Flexion Limitations holding chair for UE support    Hip ADduction Strengthening;Right;Left;1 set;10 reps;Limitations    Hip ADduction Limitations Holding to chair    Hip Extension Stengthening;Right;Left;1 set;10 reps      Knee/Hip Exercises: Seated   Other Seated Knee/Hip Exercises Side step out and in, 10 reps each side.    Marching Strengthening;Right;Left;1 set;10 reps    Sit to Sand 1 set;5 reps;with UE support   from mat surface, cues for upright standing and to use hands to boost up from chair                   PT Education - 06/10/21 0828     Education Details Additions to HEP-see instructions    Person(s) Educated Patient;Child(ren)    Methods Explanation;Demonstration;Handout;Verbal cues    Comprehension Verbalized understanding;Returned demonstration;Verbal cues required              PT Short Term Goals - 05/27/21 0905       PT SHORT TERM GOAL #1   Title Pt will perform HEP with family supervision for improved strength, balance, transfers, and gait.  TARGET 06/20/2021    Time 4    Period Weeks    Status New      PT SHORT TERM GOAL #2   Title Pt will improve 5x sit<>stand to less than or equal to 20 sec to demonstrate improved functional strength and transfer efficiency.    Baseline 25.03 sec    Time 4    Period Weeks    Status New      PT SHORT TERM GOAL #3   Title Pt will verbalize understanding of fall prevention in home environment.    Time 4    Period Weeks    Status New      PT SHORT TERM GOAL #4   Title Pt will improve TUG score to less than or equal to 40 sec for decreased fall risk.    Baseline 51.22 sec    Time 8    Period Weeks    Status New               PT Long Term Goals - 05/27/21 0906       PT LONG TERM GOAL #1   Title Pt will perform progression of HEP with family supervision for improved  strength, balance, transfers, and gait.  TARGET 07/18/2021  Time 8    Period Weeks    Status New      PT LONG TERM GOAL #2   Title Pt will improve 5x sit<>stand to less than or equal to 15 sec to demonstrate improved functional strength and transfer efficiency.    Time 8    Period Weeks    Status New      PT LONG TERM GOAL #3   Title Pt will improve gait velocity to at least 1.3 ft/sefc for improved gait efficiency and safety.    Baseline 0.82 ft/sec    Time 8    Period Weeks    Status New      PT LONG TERM GOAL #4   Title Pt will improve TUG score to less than or equal to 30 sec for decreased fall risk.    Baseline 51.22 sec    Time 8    Period Weeks    Status New      PT LONG TERM GOAL #5   Title Pt will ambulate at least 500 ft, supervision, outdoor surfaces, with appropriat assistive device as needed, for improved community gait.    Time 8    Period Weeks    Status New                   Plan - 06/10/21 0844     Clinical Impression Statement Worked on standing exercises and gait using UpWalker during today's session.  Pt needs frequent rest breaks with standing exercises; she does well with cues and min guard for gait with UpWalker.  This is likely not a device for home right now (if at all), but it does seem to help with pt improving step length.  She will continue to benefit from skilled PT to address stregnth, balance, gait towards goals.    Personal Factors and Comorbidities Comorbidity 3+    Comorbidities PMH:  Alzheimer's, HTN, HLD, depression, anxiety, osteoporosis    Examination-Activity Limitations Locomotion Level;Transfers;Stairs;Stand    Examination-Participation Restrictions Community Activity;Other   Walking in the neighborhood   Stability/Clinical Decision Making Evolving/Moderate complexity    Rehab Potential Good    PT Frequency 2x / week    PT Duration 8 weeks   including eval week   PT Treatment/Interventions ADLs/Self Care Home  Management;Gait training;Stair training;Functional mobility training;Therapeutic activities;Therapeutic exercise;Balance training;Neuromuscular re-education;Patient/family education;Passive range of motion;Manual techniques    PT Next Visit Plan Review additions to HEP, progress HEP as appropriate for strength and balance; work on gait for increased step length and arm swing (?Use UpWalker in therapy session to help with posture, step length); use of seated stepper/NuStep for flexibility and strength    PT Home Exercise Plan Access Code: [email or web address]    Consulted and Agree with Plan of Care Patient;Family member/caregiver    Family Member Consulted son, Beryle Beamsrevor             Patient will benefit from skilled therapeutic intervention in order to improve the following deficits and impairments:  Abnormal gait, Difficulty walking, Decreased balance, Impaired flexibility, Decreased mobility, Decreased strength, Postural dysfunction  Visit Diagnosis: Muscle weakness (generalized)  Unsteadiness on feet  Other abnormalities of gait and mobility     Problem List Patient Active Problem List   Diagnosis Date Noted   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 06/10/2021, 8:46 AM Gean Maidens., PT   [place] [place] [address] [place], [place], [address] Phone: [phone]   Fax:   [phone]  Name: Brandi Walsh MRN: [id number] Date of Birth: 03/19/1946

## 2021-06-10 NOTE — Patient Instructions (Signed)
Access Code: [email or web address] URL: [email or web address] Date: 06/10/2021 Prepared by: Lonia Blood  Exercises Seated Long Arc Quad - 1-2 x daily - 7 x weekly - 1-2 sets - 10 reps Seated Ankle Pumps on Table - 1-2 x daily - 7 x weekly - 1-2 sets - 10 reps Seated Hamstring Stretch - 1-2 x daily - 7 x weekly - 1 sets - 3 reps - 30 sec hold  Added 06/10/21 Standing March with Counter Support - 1 x daily - 5 x weekly - 1-2 sets - 10 reps Side to side weightshift - 1 x daily - 5 x weekly - 1-2 sets - 10 reps

## 2021-06-10 NOTE — Therapy (Signed)
[facility] [address] [place], [place], [address] Phone: [phone]   Fax:  [phone]  Occupational Therapy Evaluation  Patient Details  Name: Brandi Walsh MRN: [id number] Date of Birth: 1946-05-12 No data recorded  Encounter Date: 06/10/2021   OT End of Session - 06/10/21 1008     Visit Number 1    Number of Visits 17    Date for OT Re-Evaluation 09/10/21    Authorization Type UHC MCR    Progress Note Due on Visit 10    OT Start Time 0900    OT Stop Time 0945    OT Time Calculation (min) 45 min    Activity Tolerance Patient tolerated treatment well    Behavior During Therapy [facility] for tasks assessed/performed             Past Medical History:  Diagnosis Date   Alzheimer's disease (HCC)    COUGH DUE TO ACE INHIBITORS 11/25/2009   Qualifier: Diagnosis of  By: Everardo All MD, Sean A    Depression    DIABETES MELLITUS, TYPE II 10/13/2007   Qualifier: Diagnosis of  By: Samara Snide     HYPERLIPIDEMIA 10/13/2007   Qualifier: Diagnosis of  By: Samara Snide     HYPERTENSION 10/13/2007   Qualifier: Diagnosis of  By: Samara Snide     MENOPAUSAL SYNDROME 10/13/2007   Qualifier: Diagnosis of  By: Charlsie Quest RMA, Lucy     OSTEOPOROSIS 10/13/2007   Qualifier: Diagnosis of  By: Samara Snide      Past Surgical History:  Procedure Laterality Date   ABDOMINAL HYSTERECTOMY     BREAST BIOPSY  08/2002   TONSILLECTOMY      There were no vitals filed for this visit.   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 Medical history includes: Alzheimers, HTN, HLD, depression, anxiety, osteoporosis    Patient Stated Goals improvement in Lt hand    Currently in Pain? No/denies   none in UE's              [facility] OT Assessment - 06/10/21  0001       Assessment   Medical Diagnosis Parkinsonism    Onset Date/Surgical Date --   diagnosed approx 3 years ago   Hand Dominance Left    Prior Therapy late summer 2021, HHPT      Precautions   Precautions Fall    Precaution Comments no driving      Balance Screen   Has the patient fallen in the past 6 months No      Home  Environment   Type of Home House    Bathroom Shower/Tub Tub/Shower unit   Shower seat and grab bars   Additional Comments Pt lives in 1 level home with 4 steps to enter, personal care attendant comes M-F from approx 8-2 to assist in ADLS    Lives With Spouse      ADL   Eating/Feeding Needs assist with cutting food   uses Lt dominant hand   Grooming Modified independent    Upper Body Bathing Moderate assistance    Lower Body Bathing Maximal assistance    Upper Body Dressing Moderate assistance    Lower Body Dressing Maximal assistance    Toilet Transfer  Moderate assistance    Toileting - Clothing Manipulation Maximal assistance    Toileting -  Hygiene Modified Independent    Tub/Shower Transfer Moderate assistance      IADL   Shopping Completely unable to shop   occasionally accompanies husband but uses scooter   Light Housekeeping --   Aide and/or family member does   Meal Prep --   Aide and/or family member does   Engineer, drilling on family or friends for transportation    Medication Management Is not capable of dispensing or managing own medication    Financial Management --   Husband and granddaughter do     Mobility   Mobility Status Needs assist    Mobility Status Comments walks w/ hand held assist      Written Expression   Dominant Hand Left    Handwriting 90% legible;Mild micrographia   90% legibility - only assessed name     Vision - History   Baseline Vision No visual deficits    Additional Comments reports declined acuity      Cognition   Memory Impaired    Cognition Comments dependent for medication management       Observation/Other Assessments   Observations Lt hand swelling (mod to severe), requires hand held assist to ambulate, bradykinesia, ? apraxia, deficits in processing speed and memory with h/o Alzheimers    Other Surveys  Select    Physical Performance Test   Yes    Simulated Eating Time (seconds) 27.56 sec    Donning Doffing Jacket Comments 3 button/unbutton test: 1 min 32 sec      Posture/Postural Control   Posture/Postural Control Postural limitations    Postural Limitations Forward head    Posture Comments L shoulder higher than R      Sensation   Light Touch Appears Intact   in bilateral hands     Coordination   Gross Motor Movements are Fluid and Coordinated No    Fine Motor Movements are Fluid and Coordinated No    9 Hole Peg Test Right;Left    Right 9 Hole Peg Test 69.18 sec    Left 9 Hole Peg Test 64.38 sec    Box and Blocks Rt = 17, Lt = 17    Tremors none detected, son denies      Edema   Edema Rt hand mild, Lt hand moderate to severe (worse over night and morning) - will benefit from compression glove at night      Tone   Assessment Location Right Upper Extremity;Left Upper Extremity      ROM / Strength   AROM / PROM / Strength AROM      AROM   Overall AROM  Deficits    Overall AROM Comments Bilateral shoulder flexion approx 75% range with cues past 50%. Bilateral end range elbow extension and wrist extension limited, LUE supination limited to approx 75%, bilateral composite finger flex approx 90%      Strength   Overall Strength Deficits   limited strength and endurance     Hand Function   Right Hand Grip (lbs) 11 lbs    Left Hand Grip (lbs) 13.4 lbs      RUE Tone/LUE Tone   RUE Tone/LUE Tone Mild;Hypertonic                             OT Education - 06/10/21 1006     Education Details Discussed goals and  OT POC (including increasing independence in BADLS, UE ROM, coordination, and overall endurance/strength)    Person(s) Educated  Patient;Child(ren)    Methods Explanation    Comprehension Verbalized understanding              OT Short Term Goals - 06/10/21 1017       OT SHORT TERM GOAL #1   Title Independent with HEP for BUE ROM/endurance and hand coordination    Time 4    Period Weeks    Status New      OT SHORT TERM GOAL #2   Title Pt/family to verbalize understanding with edema management strategies for Lt hand including compression glove    Time 4    Period Weeks    Status New      OT SHORT TERM GOAL #3   Title Pt to perform UB dressing/bathing with min assist    Time 4    Period Weeks    Status New      OT SHORT TERM GOAL #4   Title Pt to perform LB dressing/bathing with mod assist    Time 4    Period Weeks    Status New      OT SHORT TERM GOAL #5   Title Pt/family to verbalize understanding with memory strategies and cognitive tips to increase participation in ADLS including some involvement in medication management    Time 4    Period Weeks    Status New               OT Long Term Goals - 06/10/21 1020       OT LONG TERM GOAL #1   Title Independent with updated HEP prn    Time 8    Period Weeks    Status New      OT LONG TERM GOAL #2   Title Pt to improve BUE function as evidenced by improving Box & Blocks score to 20 or greater    Baseline Rt/Lt = 17    Time 8    Period Weeks    Status New      OT LONG TERM GOAL #3   Title Pt to demo sufficient bilateral shoulder ROM to get shirt overhead and wash hair    Time 8    Period Weeks    Status New      OT LONG TERM GOAL #4   Title Pt to improve bilateral hand coordination as evidenced by reducing speed on 9 hole pegt est to 60 sec or under    Baseline Rt = 69.18 sec, Lt = 64.38 sec    Time 8    Period Weeks    Status New      OT LONG TERM GOAL #5   Title Pt to be overall min assist with BADLS    Time 8    Period Weeks    Status New      Long Term Additional Goals   Additional Long Term Goals Yes      OT  LONG TERM GOAL #6   Title Pt to perform simple dyanmic standing task (brushing teeth, folding laundry) for 10 min. w/o LOB using one hand countertop support prn    Time 8    Period Weeks    Status New                   Plan - 06/10/21 1010     Clinical Impression Statement Pt is a 75 y.o. female  who presents to OPOT with Parkinsonism. Comorbidities include Alzheimers, osteoporosis, depression, anxiety, HTN. Pt presents with overall decreased strength, endurance, ROM BUE's, and coordination; decreased balance and cognition (decreased processing speed, memory) and swelling Lt hand. Pt would benefit from O.T. to address these deficits, improve overall safety and independence with BADLS, develop specific HEP's, and pt/family education.    OT Occupational Profile and History Detailed Assessment- Review of Records and additional review of physical, cognitive, psychosocial history related to current functional performance    Occupational performance deficits (Please refer to evaluation for details): ADL's;IADL's;Social Participation    Body Structure / Function / Physical Skills ADL;Decreased knowledge of use of DME;Strength;Balance;Dexterity;GMC;Tone;Edema;Body mechanics;UE functional use;IADL;ROM;Sensation;Mobility;Flexibility;Coordination;Decreased knowledge of precautions;FMC    Cognitive Skills Energy/Drive;Memory;Problem Solve;Safety Awareness    Rehab Potential Good    Clinical Decision Making Several treatment options, min-mod task modification necessary    Comorbidities Affecting Occupational Performance: Presence of comorbidities impacting occupational performance    Comorbidities impacting occupational performance description: Alzheimers, osteoporosis    Modification or Assistance to Complete Evaluation  Min-Moderate modification of tasks or assist with assess necessary to complete eval    OT Frequency 2x / week    OT Duration 8 weeks   or 16 visits over 3 months (d/t potential  scheduling and transportation conflicts), pluse eval   OT Treatment/Interventions Self-care/ADL training;DME and/or AE instruction;Splinting;Compression bandaging;Therapeutic activities;Ultrasound;Therapeutic exercise;Cognitive remediation/compensation;Coping strategies training;Neuromuscular education;Functional Mobility Training;Passive range of motion;Visual/perceptual remediation/compensation;Manual Therapy;Patient/family education    Plan issue compression glove for Lt hand and review wear and care w/ pt/family, begin HEP for UE ROM/endurance (bag or cane ex's?) and for coordination bilaterally    Consulted and Agree with Plan of Care Patient;Family member/caregiver    Family Member Consulted Son             Patient will benefit from skilled therapeutic intervention in order to improve the following deficits and impairments:   Body Structure / Function / Physical Skills: ADL, Decreased knowledge of use of DME, Strength, Balance, Dexterity, GMC, Tone, Edema, Body mechanics, UE functional use, IADL, ROM, Sensation, Mobility, Flexibility, Coordination, Decreased knowledge of precautions, FMC Cognitive Skills: Energy/Drive, Memory, Problem Solve, Safety Awareness     Visit Diagnosis: Muscle weakness (generalized) - Plan: Ot plan of care cert/re-cert  Unsteadiness on feet - Plan: Ot plan of care cert/re-cert  Other lack of coordination - Plan: Ot plan of care cert/re-cert  Localized edema - Plan: Ot plan of care cert/re-cert  Abnormal posture - Plan: Ot plan of care cert/re-cert  Other symptoms and signs involving the musculoskeletal system - Plan: Ot plan of care cert/re-cert  Other symptoms and signs involving the nervous system - Plan: Ot plan of care cert/re-cert  Attention and concentration deficit - Plan: Ot plan of care cert/re-cert    Problem List Patient Active Problem List   Diagnosis Date Noted   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   Abnormal liver enzymes 09/19/2019   Blurred  vision 09/19/2019   Hypercalcemia 03/15/2019   Major depression in full remission (HCC) 11/24/2018   Memory loss 12/24/2016   Encounter for well adult exam with abnormal findings 06/13/2013   Hyperparathyroidism, primary (HCC) 06/12/2013   GERD (gastroesophageal reflux disease) 02/19/2012   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   COUGH DUE TO ACE INHIBITORS 11/25/2009   Diabetes (HCC) 10/13/2007   Dyslipidemia 10/13/2007   Essential hypertension 10/13/2007   MENOPAUSAL SYNDROME 10/13/2007   Osteoporosis 10/13/2007    Kelli ChurnBallie, Betti Goodenow Johnson, OTR/L 06/10/2021, 10:32 AM  [facility] [address] [place], [place], [address] Phone: [phone]   Fax:  [phone]  Name: NECOLE MINASSIAN MRN: [id number] Date of Birth: [birth year]

## 2021-06-11 ENCOUNTER — Encounter: Payer: Self-pay | Admitting: Family

## 2021-06-12 ENCOUNTER — Ambulatory Visit: Payer: Medicare Other | Admitting: Physical Therapy

## 2021-06-12 ENCOUNTER — Encounter: Payer: Self-pay | Admitting: Physical Therapy

## 2021-06-12 ENCOUNTER — Other Ambulatory Visit: Payer: Self-pay

## 2021-06-12 DIAGNOSIS — M6281 Muscle weakness (generalized): Secondary | ICD-10-CM | POA: Diagnosis not present

## 2021-06-12 DIAGNOSIS — R2689 Other abnormalities of gait and mobility: Secondary | ICD-10-CM

## 2021-06-12 DIAGNOSIS — R2681 Unsteadiness on feet: Secondary | ICD-10-CM

## 2021-06-13 NOTE — Therapy (Signed)
[facility] [address] [place], [place], [address] Phone: [phone]   Fax:  [phone]  Physical Therapy Treatment  Patient Details  Name: Brandi Walsh MRN: [id number] Date of Birth: 1946-11-06 Referring Provider (PT): Patrcia Dolly   Encounter Date: 06/12/2021   PT End of Session - 06/12/21 0809     Visit Number 3    Number of Visits 16    Date for PT Re-Evaluation 07/18/21    Authorization Type UHC medicare    Progress Note Due on Visit 10    PT Start Time 0803    PT Stop Time 0845    PT Time Calculation (min) 42 min    Equipment Utilized During Treatment Gait belt    Activity Tolerance Patient tolerated treatment well    Behavior During Therapy [facility] for tasks assessed/performed             Past Medical History:  Diagnosis Date   Alzheimer's disease (HCC)    COUGH DUE TO ACE INHIBITORS 11/25/2009   Qualifier: Diagnosis of  By: Everardo All MD, Sean A    Depression    DIABETES MELLITUS, TYPE II 10/13/2007   Qualifier: Diagnosis of  By: Samara Snide     HYPERLIPIDEMIA 10/13/2007   Qualifier: Diagnosis of  By: Samara Snide     HYPERTENSION 10/13/2007   Qualifier: Diagnosis of  By: Samara Snide     MENOPAUSAL SYNDROME 10/13/2007   Qualifier: Diagnosis of  By: Charlsie Quest RMA, Lucy     OSTEOPOROSIS 10/13/2007   Qualifier: Diagnosis of  By: Samara Snide      Past Surgical History:  Procedure Laterality Date   ABDOMINAL HYSTERECTOMY     BREAST BIOPSY  08/2002   TONSILLECTOMY      There were no vitals filed for this visit.   Subjective Assessment - 06/12/21 0808     Subjective No new complaitns. No falls or pain to report. States the ex's are going well at home.    Patient is accompained by: Family member   spouse in lobby   Pertinent History MH:  Alzheimer's, HTN, HLD, depression, anxiety, osteoporosis    Patient Stated Goals Pt's goals are to walk better.    Currently in Pain? No/denies     Pain Score 0-No pain                    [facility] Adult PT Treatment/Exercise - 06/12/21 0810       Transfers   Transfers Sit to Stand;Stand to Sit    Sit to Stand 5: Supervision;With upper extremity assist;From bed    Stand to Sit 5: Supervision;With upper extremity assist;To bed      Ambulation/Gait   Ambulation/Gait Yes    Ambulation/Gait Assistance 4: Min guard;5: Supervision    Ambulation/Gait Assistance Details cues for posture and increased bil step length with all gait.    Assistive device None;Other (Comment)    Gait Pattern Step-through pattern;Decreased arm swing - right;Decreased arm swing - left;Step-to pattern;Decreased step length - right;Decreased step length - left;Decreased dorsiflexion - right;Decreased dorsiflexion - left;Right flexed knee in stance;Left flexed knee in stance;Trunk flexed;Wide base of support;Poor foot clearance - left;Poor foot clearance - right    Ambulation Surface Level;Indoor    Gait Comments continued with trial of Up Walker in session with improved posture and step/stride length noted.      Exercises   Exercises Other Exercises    Other Exercises  reviewed seated ex's from HEP with no issues noted or reported in session.      Knee/Hip Exercises: Aerobic   Other Aerobic Scifit level 2.5 x 3 minutes, rest for 30 seconds, then level 1.5 x 5  minutes with goal >/= 40  steps per minute for strengthening, reciprocal movements and activity tolerance.      Knee/Hip Exercises: Standing   Heel Raises Both;1 set;10 reps;Limitations    Heel Raises Limitations Heel raises/toe raises; limited in toe raises,. UE support on chair back for balance assistance    Knee Flexion AROM;Strengthening;Both;1 set;10 reps;Limitations    Knee Flexion Limitations with UE support on chair, alternating HS curls for 10 reps each side.    Hip Flexion Stengthening;Right;Left;1 set;10 reps;Knee bent;Limitations    Hip Flexion Limitations holding chair for UE  support, alternating LE's    Hip ADduction AROM;Strengthening;Both;1 set;10 reps;Limitations    Hip ADduction Limitations with chair support alternating LE's with small range noted on both sides                      PT Short Term Goals - 05/27/21 0905       PT SHORT TERM GOAL #1   Title Pt will perform HEP with family supervision for improved strength, balance, transfers, and gait.  TARGET 06/20/2021    Time 4    Period Weeks    Status New      PT SHORT TERM GOAL #2   Title Pt will improve 5x sit<>stand to less than or equal to 20 sec to demonstrate improved functional strength and transfer efficiency.    Baseline 25.03 sec    Time 4    Period Weeks    Status New      PT SHORT TERM GOAL #3   Title Pt will verbalize understanding of fall prevention in home environment.    Time 4    Period Weeks    Status New      PT SHORT TERM GOAL #4   Title Pt will improve TUG score to less than or equal to 40 sec for decreased fall risk.    Baseline 51.22 sec    Time 8    Period Weeks    Status New               PT Long Term Goals - 05/27/21 0906       PT LONG TERM GOAL #1   Title Pt will perform progression of HEP with family supervision for improved strength, balance, transfers, and gait.  TARGET 07/18/2021    Time 8    Period Weeks    Status New      PT LONG TERM GOAL #2   Title Pt will improve 5x sit<>stand to less than or equal to 15 sec to demonstrate improved functional strength and transfer efficiency.    Time 8    Period Weeks    Status New      PT LONG TERM GOAL #3   Title Pt will improve gait velocity to at least 1.3 ft/sefc for improved gait efficiency and safety.    Baseline 0.82 ft/sec    Time 8    Period Weeks    Status New      PT LONG TERM GOAL #4   Title Pt will improve TUG score to less than or equal to 30 sec for decreased fall risk.    Baseline 51.22 sec    Time 8    Period  Weeks    Status New      PT LONG TERM GOAL #5   Title Pt  will ambulate at least 500 ft, supervision, outdoor surfaces, with appropriat assistive device as needed, for improved community gait.    Time 8    Period Weeks    Status New                   Plan - 06/12/21 0810     Clinical Impression Statement Today's skilled session continued to focus on use of Up Walker with gait and strengthening with rest breaks needed due to fatigue. No other issues noted or reported in session. The pt is making progress toward goals and should benefit from continued PT to progress toward unmet goals.    Personal Factors and Comorbidities Comorbidity 3+    Comorbidities PMH:  Alzheimer's, HTN, HLD, depression, anxiety, osteoporosis    Examination-Activity Limitations Locomotion Level;Transfers;Stairs;Stand    Examination-Participation Restrictions Community Activity;Other   Walking in the neighborhood   Stability/Clinical Decision Making Evolving/Moderate complexity    Rehab Potential Good    PT Frequency 2x / week    PT Duration 8 weeks   including eval week   PT Treatment/Interventions ADLs/Self Care Home Management;Gait training;Stair training;Functional mobility training;Therapeutic activities;Therapeutic exercise;Balance training;Neuromuscular re-education;Patient/family education;Passive range of motion;Manual techniques    PT Next Visit Plan Review additions to HEP, progress HEP as appropriate for strength and balance; work on gait for increased step length and arm swing (?Use UpWalker in therapy session to help with posture, step length); use of seated stepper/NuStep for flexibility and strength    PT Home Exercise Plan Access Code: [email or web address]    Consulted and Agree with Plan of Care Patient;Family member/caregiver    Family Member Consulted son, Beryle Beams             Patient will benefit from skilled therapeutic intervention in order to improve the following deficits and impairments:  Abnormal gait, Difficulty walking, Decreased balance, Impaired  flexibility, Decreased mobility, Decreased strength, Postural dysfunction  Visit Diagnosis: Muscle weakness (generalized)  Unsteadiness on feet  Other abnormalities of gait and mobility     Problem List Patient Active Problem List   Diagnosis Date Noted   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   Abnormal liver enzymes 09/19/2019   Blurred vision 09/19/2019   Hypercalcemia 03/15/2019   Major depression in full remission (HCC) 11/24/2018   Memory loss 12/24/2016   Encounter for well adult exam with abnormal findings 06/13/2013   Hyperparathyroidism, primary (HCC) 06/12/2013   GERD (gastroesophageal reflux disease) 02/19/2012   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   COUGH DUE TO ACE INHIBITORS 11/25/2009   Diabetes (HCC) 10/13/2007   Dyslipidemia 10/13/2007   Essential hypertension 10/13/2007   MENOPAUSAL SYNDROME 10/13/2007   Osteoporosis 10/13/2007   Sallyanne Kuster, PTA, [facility] [address], [address] [place], [place] [address] [phone] 06/13/21, 1:20 PM   Name: Brandi Walsh MRN: [id number] Date of Birth: 1946-05-27

## 2021-06-15 ENCOUNTER — Other Ambulatory Visit: Payer: Self-pay | Admitting: Family

## 2021-06-17 ENCOUNTER — Ambulatory Visit: Payer: Medicare Other | Admitting: Physical Therapy

## 2021-06-17 ENCOUNTER — Encounter: Payer: Self-pay | Admitting: Physical Therapy

## 2021-06-17 ENCOUNTER — Other Ambulatory Visit: Payer: Self-pay

## 2021-06-17 ENCOUNTER — Ambulatory Visit: Payer: Medicare Other | Admitting: Occupational Therapy

## 2021-06-17 DIAGNOSIS — R29818 Other symptoms and signs involving the nervous system: Secondary | ICD-10-CM

## 2021-06-17 DIAGNOSIS — R29898 Other symptoms and signs involving the musculoskeletal system: Secondary | ICD-10-CM

## 2021-06-17 DIAGNOSIS — M6281 Muscle weakness (generalized): Secondary | ICD-10-CM

## 2021-06-17 DIAGNOSIS — R2689 Other abnormalities of gait and mobility: Secondary | ICD-10-CM

## 2021-06-17 DIAGNOSIS — R2681 Unsteadiness on feet: Secondary | ICD-10-CM

## 2021-06-17 DIAGNOSIS — R278 Other lack of coordination: Secondary | ICD-10-CM

## 2021-06-17 NOTE — Patient Instructions (Addendum)
  SHOULDER: Flexion - Supine (Cane)    Hold cane in both hands. Raise arms up overhead. Do not allow back to arch. Hold _3__ seconds. _10__ reps per set, __2_ sets per day. Keep elbows straight. Go all the way back down to thigh.   SHOULDER: Flexion - Sitting    Hold cane with both hands. Raise arms up to eye level only. Keep elbows straight. Hold _3__ seconds. NO weight _10__ reps per set, __2_ sets per day   Triceps, Butterfly    Laying down, clasp hands behind head. Raise elbows to shoulder level and bring together, then stretch out towards bed like a butterfly. Hold __10_ seconds. Repeat _5__ times per session. Do __2_ sessions per day.    Bag Exercises:  Small trash bag or produce bag works best.  For all exercises, sit with big posture (sit up tall with head up) and use big movements. Perform the following exercises 2 times per day.  Hold bag in both hands in front of you with hands/arms shoulder length apart. Lift leg and move bag completely under each foot and back. Repeat 5-10 times on each side.

## 2021-06-17 NOTE — Therapy (Signed)
[facility] [address] [place], [place], [address] Phone: [phone]   Fax:  [phone]  Physical Therapy Treatment  Patient Details  Name: Brandi Walsh MRN: [id number] Date of Birth: Apr 21, 1946 Referring Provider (PT): Patrcia Dolly   Encounter Date: 06/17/2021   PT End of Session - 06/17/21 0854     Visit Number 4    Number of Visits 16    Date for PT Re-Evaluation 07/18/21    Authorization Type [employer] medicare    Progress Note Due on Visit 10    PT Start Time [phone]    PT Stop Time 0930    PT Time Calculation (min) 38 min    Equipment Utilized During Treatment Gait belt    Activity Tolerance Patient tolerated treatment well    Behavior During Therapy [facility] for tasks assessed/performed             Past Medical History:  Diagnosis Date   Alzheimer's disease (HCC)    COUGH DUE TO ACE INHIBITORS 11/25/2009   Qualifier: Diagnosis of  By: Everardo All MD, Sean A    Depression    DIABETES MELLITUS, TYPE II 10/13/2007   Qualifier: Diagnosis of  By: Samara Snide     HYPERLIPIDEMIA 10/13/2007   Qualifier: Diagnosis of  By: Samara Snide     HYPERTENSION 10/13/2007   Qualifier: Diagnosis of  By: Samara Snide     MENOPAUSAL SYNDROME 10/13/2007   Qualifier: Diagnosis of  By: Charlsie Quest RMA, Lucy     OSTEOPOROSIS 10/13/2007   Qualifier: Diagnosis of  By: Samara Snide      Past Surgical History:  Procedure Laterality Date   ABDOMINAL HYSTERECTOMY     BREAST BIOPSY  08/2002   TONSILLECTOMY      There were no vitals filed for this visit.   Subjective Assessment - 06/17/21 0854     Subjective No changes, no falls.    Patient is accompained by: Family member   spouse in lobby   Pertinent History MH:  Alzheimer's, HTN, HLD, depression, anxiety, osteoporosis    Patient Stated Goals Pt's goals are to walk better.    Currently in Pain? No/denies                               [facility]  Adult PT Treatment/Exercise - 06/17/21 0001       Transfers   Transfers Sit to Stand;Stand to Sit    Sit to Stand 5: Supervision;With upper extremity assist;From bed;From chair/3-in-1    Stand to Sit 5: Supervision;With upper extremity assist;To bed;To chair/3-in-1      Ambulation/Gait   Ambulation/Gait Yes    Ambulation/Gait Assistance 4: Min guard    Ambulation/Gait Assistance Details No device this visit    Ambulation Distance (Feet) 40 Feet   x 2, 100 ft x 2   Assistive device None    Gait Pattern Step-through pattern;Decreased arm swing - right;Decreased arm swing - left;Step-to pattern;Decreased step length - right;Decreased step length - left;Decreased dorsiflexion - right;Decreased dorsiflexion - left;Right flexed knee in stance;Left flexed knee in stance;Trunk flexed;Wide base of support;Poor foot clearance - left;Poor foot clearance - right    Ambulation Surface Level;Indoor    Gait Comments Gait no device in session between activities today, focus on cues for widened BOS and for increased step length.      Knee/Hip Exercises: Aerobic   Other Aerobic Scifit level  2 x 3 minutes, rest for 30 seconds, then additional 1.45minutes with goal >/= 40  steps per minute for strengthening, reciprocal movements and activity tolerance.  Cues at times for increased stride length on machine      Knee/Hip Exercises: Standing   Heel Raises 10 reps;Limitations;2 sets;Both    Heel Raises Limitations Heel raises/toe raises; limited in toe raises,. UE support on counterfor balance assistance    Knee Flexion AROM;Strengthening;Both;10 reps;Limitations;2 sets    Knee Flexion Limitations with UE support on counter, alternating HS curls for 10 reps each side.    Hip ADduction AROM;Strengthening;Both;10 reps;Limitations;2 sets    Hip ADduction Limitations with counter support alternating LE's with small range noted on both sides    Hip Extension Stengthening;Right;Left;1 set;10 reps;Limitations     Extension Limitations At counter for support            Additional 40 ft with gait min guard, cues for step length.    Performed from HEP at counter:    Standing March with Counter Support - 1 x daily - 5 x weekly - 1-2 sets - 10 reps Side to side weightshift - 1 x daily - 5 x weekly - 1-2 sets - 10 reps       Balance Exercises - 06/17/21 0001       Balance Exercises: Standing   Stepping Strategy Anterior;UE support;10 reps    Retro Gait Upper extremity support;3 reps;Limitations    Retro Gait Limitations Forward/back at counter, cues for step length, widened BOS    Sidestepping Upper extremity support;3 reps;Limitations    Sidestepping Limitations R and L at counter, cues for larger step length (pt goes from 8 steps each direction, to 6, then 5 with cues for increased step length).                 PT Short Term Goals - 05/27/21 0905       PT SHORT TERM GOAL #1   Title Pt will perform HEP with family supervision for improved strength, balance, transfers, and gait.  TARGET 06/20/2021    Time 4    Period Weeks    Status New      PT SHORT TERM GOAL #2   Title Pt will improve 5x sit<>stand to less than or equal to 20 sec to demonstrate improved functional strength and transfer efficiency.    Baseline 25.03 sec    Time 4    Period Weeks    Status New      PT SHORT TERM GOAL #3   Title Pt will verbalize understanding of fall prevention in home environment.    Time 4    Period Weeks    Status New      PT SHORT TERM GOAL #4   Title Pt will improve TUG score to less than or equal to 40 sec for decreased fall risk.    Baseline 51.22 sec    Time 8    Period Weeks    Status New               PT Long Term Goals - 05/27/21 0906       PT LONG TERM GOAL #1   Title Pt will perform progression of HEP with family supervision for improved strength, balance, transfers, and gait.  TARGET 07/18/2021    Time 8    Period Weeks    Status New      PT LONG TERM GOAL  #2   Title Pt will  improve 5x sit<>stand to less than or equal to 15 sec to demonstrate improved functional strength and transfer efficiency.    Time 8    Period Weeks    Status New      PT LONG TERM GOAL #3   Title Pt will improve gait velocity to at least 1.3 ft/sefc for improved gait efficiency and safety.    Baseline 0.82 ft/sec    Time 8    Period Weeks    Status New      PT LONG TERM GOAL #4   Title Pt will improve TUG score to less than or equal to 30 sec for decreased fall risk.    Baseline 51.22 sec    Time 8    Period Weeks    Status New      PT LONG TERM GOAL #5   Title Pt will ambulate at least 500 ft, supervision, outdoor surfaces, with appropriat assistive device as needed, for improved community gait.    Time 8    Period Weeks    Status New                   Plan - 06/17/21 0906     Clinical Impression Statement Skilled PT session today focused on strengthening and balance activities.  With standing exercises in session, she is able to increase to 2 sets of most exercises; muscle fatigue/tremors noted at end of standing strengthening exercises.  Pt conitnues to tolerate well and will continue to benfeit from skilled PT for strength, balance, and gait towards goals for improved mobility.    Personal Factors and Comorbidities Comorbidity 3+    Comorbidities PMH:  Alzheimer's, HTN, HLD, depression, anxiety, osteoporosis    Examination-Activity Limitations Locomotion Level;Transfers;Stairs;Stand    Examination-Participation Restrictions Community Activity;Other   Walking in the neighborhood   Stability/Clinical Decision Making Evolving/Moderate complexity    Rehab Potential Good    PT Frequency 2x / week    PT Duration 8 weeks   including eval week   PT Treatment/Interventions ADLs/Self Care Home Management;Gait training;Stair training;Functional mobility training;Therapeutic activities;Therapeutic exercise;Balance training;Neuromuscular  re-education;Patient/family education;Passive range of motion;Manual techniques    PT Next Visit Plan Check STGs; progress HEP as appropriate for strength and balance; work on gait for increased step length and arm swing (?Use UpWalker in therapy session to help with posture, step length); use of seated stepper/NuStep for flexibility and strength    PT Home Exercise Plan Access Code: [email or web address]    Consulted and Agree with Plan of Care Patient;Family member/caregiver    Family Member Consulted son, Beryle Beams             Patient will benefit from skilled therapeutic intervention in order to improve the following deficits and impairments:  Abnormal gait, Difficulty walking, Decreased balance, Impaired flexibility, Decreased mobility, Decreased strength, Postural dysfunction  Visit Diagnosis: Muscle weakness (generalized)  Unsteadiness on feet  Other abnormalities of gait and mobility     Problem List Patient Active Problem List   Diagnosis Date Noted   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 06/17/2021, 9:33 AM Gean Maidens., PT   [facility] [address] [place], [place], [address] Phone: [phone]   Fax:  [phone]  Name: Brandi Walsh MRN: [id number] Date of Birth: 06-27-1946

## 2021-06-17 NOTE — Therapy (Signed)
[facility] [address] [place], [place], [address] Phone: [phone]   Fax:  [phone]  Occupational Therapy Treatment  Patient Details  Name: Brandi Walsh MRN: [id number] Date of Birth: Apr 06, 1946 No data recorded  Encounter Date: 06/17/2021   OT End of Session - 06/17/21 0937     Visit Number 2    Number of Visits 17    Date for OT Re-Evaluation 09/10/21    Authorization Type U[employer] MCR    Progress Note Due on Visit 10    OT Start Time 0800    OT Stop Time 0845    OT Time Calculation (min) 45 min    Activity Tolerance Patient tolerated treatment well    Behavior During Therapy [facility] for tasks assessed/performed             Past Medical History:  Diagnosis Date   Alzheimer's disease (HCC)    COUGH DUE TO ACE INHIBITORS 11/25/2009   Qualifier: Diagnosis of  By: Everardo All MD, Sean A    Depression    DIABETES MELLITUS, TYPE II 10/13/2007   Qualifier: Diagnosis of  By: Samara Snide     HYPERLIPIDEMIA 10/13/2007   Qualifier: Diagnosis of  By: Samara Snide     HYPERTENSION 10/13/2007   Qualifier: Diagnosis of  By: Samara Snide     MENOPAUSAL SYNDROME 10/13/2007   Qualifier: Diagnosis of  By: Charlsie Quest RMA, Lucy     OSTEOPOROSIS 10/13/2007   Qualifier: Diagnosis of  By: Samara Snide      Past Surgical History:  Procedure Laterality Date   ABDOMINAL HYSTERECTOMY     BREAST BIOPSY  08/2002   TONSILLECTOMY      There were no vitals filed for this visit.   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 Medical history includes: Alzheimers, HTN, HLD, depression, anxiety, osteoporosis    Patient Stated Goals improvement in Lt  hand    Currently in Pain? No/denies   in UE's            Asked husband to come back to session with patient to increase carryover of recommendations and for education on compression gloves.   Pt with more swelling in Rt hand today, minimal Lt hand. Pt reports swelling can alternate b/t hands therefore issued compressions gloves for both hands (to be worn at night) and reviewed wear and care with pt/husband.   Pt cued w/ ambulation back to gym and from table to mat to walk big and pick up feet (w/ hand held assist)   Pt issued HEP for shoulder ROM and endurance - cued to move big through entire ROM and for elbow extension, and to simulate LE dressing.   Flipping cards over Rt hand, Lt hand w/ cues for big movement                     OT Education - 06/17/21 0843     Education Details compression glove wear and care, initial HEP    Person(s) Educated Patient;Spouse    Methods Explanation;Demonstration;Tactile cues;Verbal cues;Handout    Comprehension Verbalized understanding;Returned demonstration;Verbal cues required;Need further instruction  OT Short Term Goals - 06/17/21 0937       OT SHORT TERM GOAL #1   Title Independent with HEP for BUE ROM/endurance and hand coordination    Time 4    Period Weeks    Status On-going      OT SHORT TERM GOAL #2   Title Pt/family to verbalize understanding with edema management strategies for Lt hand including compression glove    Time 4    Period Weeks    Status On-going      OT SHORT TERM GOAL #3   Title Pt to perform UB dressing/bathing with min assist    Time 4    Period Weeks    Status New      OT SHORT TERM GOAL #4   Title Pt to perform LB dressing/bathing with mod assist    Time 4    Period Weeks    Status New      OT SHORT TERM GOAL #5   Title Pt/family to verbalize understanding with memory strategies and cognitive tips to increase participation in ADLS including some involvement in  medication management    Time 4    Period Weeks    Status New               OT Long Term Goals - 06/10/21 1020       OT LONG TERM GOAL #1   Title Independent with updated HEP prn    Time 8    Period Weeks    Status New      OT LONG TERM GOAL #2   Title Pt to improve BUE function as evidenced by improving Box & Blocks score to 20 or greater    Baseline Rt/Lt = 17    Time 8    Period Weeks    Status New      OT LONG TERM GOAL #3   Title Pt to demo sufficient bilateral shoulder ROM to get shirt overhead and wash hair    Time 8    Period Weeks    Status New      OT LONG TERM GOAL #4   Title Pt to improve bilateral hand coordination as evidenced by reducing speed on 9 hole pegt est to 60 sec or under    Baseline Rt = 69.18 sec, Lt = 64.38 sec    Time 8    Period Weeks    Status New      OT LONG TERM GOAL #5   Title Pt to be overall min assist with BADLS    Time 8    Period Weeks    Status New      Long Term Additional Goals   Additional Long Term Goals Yes      OT LONG TERM GOAL #6   Title Pt to perform simple dyanmic standing task (brushing teeth, folding laundry) for 10 min. w/o LOB using one hand countertop support prn    Time 8    Period Weeks    Status New                   Plan - 06/17/21 3818     Clinical Impression Statement Pt progressing towards STG's #1 and #2. Pt's husband w/ increased awareness to let her do more BADLS and not to do it all for her.    OT Occupational Profile and History Detailed Assessment- Review of Records and additional review of physical, cognitive, psychosocial history related to current  functional performance    Occupational performance deficits (Please refer to evaluation for details): ADL's;IADL's;Social Participation    Body Structure / Function / Physical Skills ADL;Decreased knowledge of use of DME;Strength;Balance;Dexterity;GMC;Tone;Edema;Body mechanics;UE functional  use;IADL;ROM;Sensation;Mobility;Flexibility;Coordination;Decreased knowledge of precautions;FMC    Cognitive Skills Energy/Drive;Memory;Problem Solve;Safety Awareness    Rehab Potential Good    Clinical Decision Making Several treatment options, min-mod task modification necessary    Comorbidities Affecting Occupational Performance: Presence of comorbidities impacting occupational performance    Comorbidities impacting occupational performance description: Alzheimers, osteoporosis    Modification or Assistance to Complete Evaluation  Min-Moderate modification of tasks or assist with assess necessary to complete eval    OT Frequency 2x / week    OT Duration 8 weeks   or 16 visits over 3 months (d/t potential scheduling and transportation conflicts), pluse eval   OT Treatment/Interventions Self-care/ADL training;DME and/or AE instruction;Splinting;Compression bandaging;Therapeutic activities;Ultrasound;Therapeutic exercise;Cognitive remediation/compensation;Coping strategies training;Neuromuscular education;Functional Mobility Training;Passive range of motion;Visual/perceptual remediation/compensation;Manual Therapy;Patient/family education    Plan review sh HEP, issue coordination HEP (4 ex's or less), and continue family education on allowing her to participate more for BADLS w/ sup and assist only when needed.    Consulted and Agree with Plan of Care Patient;Family member/caregiver    Family Member Consulted Son             Patient will benefit from skilled therapeutic intervention in order to improve the following deficits and impairments:   Body Structure / Function / Physical Skills: ADL, Decreased knowledge of use of DME, Strength, Balance, Dexterity, GMC, Tone, Edema, Body mechanics, UE functional use, IADL, ROM, Sensation, Mobility, Flexibility, Coordination, Decreased knowledge of precautions, FMC Cognitive Skills: Energy/Drive, Memory, Problem Solve, Safety Awareness     Visit  Diagnosis: Muscle weakness (generalized)  Unsteadiness on feet  Other lack of coordination  Other symptoms and signs involving the musculoskeletal system  Other symptoms and signs involving the nervous system    Problem List Patient Active Problem List   Diagnosis Date Noted   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   Abnormal liver enzymes 09/19/2019   Blurred vision 09/19/2019   Hypercalcemia 03/15/2019   Major depression in full remission (HCC) 11/24/2018   Memory loss 12/24/2016   Encounter for well adult exam with abnormal findings 06/13/2013   Hyperparathyroidism, primary (HCC) 06/12/2013   GERD (gastroesophageal reflux disease) 02/19/2012   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   COUGH DUE TO ACE INHIBITORS 11/25/2009   Diabetes (HCC) 10/13/2007   Dyslipidemia 10/13/2007   Essential hypertension 10/13/2007   MENOPAUSAL SYNDROME 10/13/2007   Osteoporosis 10/13/2007    Kelli Churn, OTR/L 06/17/2021, 9:41 AM  [facility] [address] [place], [place], [address] Phone: [phone]   Fax:  [phone]  Name: JUPITER BOYS MRN: [id number] Date of Birth: 01-Nov-1946

## 2021-06-19 ENCOUNTER — Ambulatory Visit: Payer: Medicare Other | Admitting: Physical Therapy

## 2021-06-19 ENCOUNTER — Other Ambulatory Visit: Payer: Self-pay

## 2021-06-19 ENCOUNTER — Ambulatory Visit: Payer: Medicare Other | Admitting: Occupational Therapy

## 2021-06-19 DIAGNOSIS — R29818 Other symptoms and signs involving the nervous system: Secondary | ICD-10-CM

## 2021-06-19 DIAGNOSIS — R2689 Other abnormalities of gait and mobility: Secondary | ICD-10-CM

## 2021-06-19 DIAGNOSIS — M6281 Muscle weakness (generalized): Secondary | ICD-10-CM | POA: Diagnosis not present

## 2021-06-19 DIAGNOSIS — R41840 Attention and concentration deficit: Secondary | ICD-10-CM

## 2021-06-19 DIAGNOSIS — R278 Other lack of coordination: Secondary | ICD-10-CM

## 2021-06-19 DIAGNOSIS — R2681 Unsteadiness on feet: Secondary | ICD-10-CM

## 2021-06-19 DIAGNOSIS — R600 Localized edema: Secondary | ICD-10-CM

## 2021-06-19 DIAGNOSIS — R29898 Other symptoms and signs involving the musculoskeletal system: Secondary | ICD-10-CM

## 2021-06-19 NOTE — Patient Instructions (Signed)

## 2021-06-19 NOTE — Telephone Encounter (Signed)
I have attempted to call pt granddaughter to relay message about grand mother. No answer so I left a message to call back.

## 2021-06-19 NOTE — Therapy (Signed)
[facility] [address] [place], [place], [address] Phone: [phone]   Fax:  [phone]  Physical Therapy Treatment  Patient Details  Name: Brandi Walsh MRN: [id number] Date of Birth: 06/01/1946 Referring Provider (PT): Patrcia Dolly   Encounter Date: 06/19/2021   PT End of Session - 06/19/21 0806     Visit Number 5    Number of Visits 16    Date for PT Re-Evaluation 07/18/21    Authorization Type UHC medicare    Progress Note Due on Visit 10    PT Start Time 0806    PT Stop Time 0845    PT Time Calculation (min) 39 min    Equipment Utilized During Treatment Gait belt    Activity Tolerance Patient tolerated treatment well    Behavior During Therapy [facility] for tasks assessed/performed             Past Medical History:  Diagnosis Date   Alzheimer's disease (HCC)    COUGH DUE TO ACE INHIBITORS 11/25/2009   Qualifier: Diagnosis of  By: Everardo All MD, Sean A    Depression    DIABETES MELLITUS, TYPE II 10/13/2007   Qualifier: Diagnosis of  By: Samara Snide     HYPERLIPIDEMIA 10/13/2007   Qualifier: Diagnosis of  By: Samara Snide     HYPERTENSION 10/13/2007   Qualifier: Diagnosis of  By: Samara Snide     MENOPAUSAL SYNDROME 10/13/2007   Qualifier: Diagnosis of  By: Charlsie Quest RMA, Lucy     OSTEOPOROSIS 10/13/2007   Qualifier: Diagnosis of  By: Samara Snide      Past Surgical History:  Procedure Laterality Date   ABDOMINAL HYSTERECTOMY     BREAST BIOPSY  08/2002   TONSILLECTOMY      There were no vitals filed for this visit.   Subjective Assessment - 06/19/21 0809     Subjective No changes, no falls since last visit.    Patient is accompained by: Family member   spouse in lobby   Pertinent History MH:  Alzheimer's, HTN, HLD, depression, anxiety, osteoporosis    Patient Stated Goals Pt's goals are to walk better.    Currently in Pain? No/denies                    Access Code:  [email or web address] URL: [email or web address] Date: 06/19/2021 Prepared by: Lonia Blood  Exercises-Reviewed full HEP, with pt return demo understanding with min cues  Seated Long Arc Quad - 1-2 x daily - 7 x weekly - 1-2 sets - 10 reps Seated Ankle Pumps on Table - 1-2 x daily - 7 x weekly - 1-2 sets - 10 reps Seated Hamstring Stretch - 1-2 x daily - 7 x weekly - 1 sets - 3 reps - 30 sec hold Standing March with Counter Support - 1 x daily - 5 x weekly - 1-2 sets - 10 reps Side to side weightshift - 1 x daily - 5 x weekly - 1-2 sets - 10 reps            [facility] Adult PT Treatment/Exercise - 06/19/21 0001       Transfers   Transfers Sit to Stand;Stand to Sit    Sit to Stand 5: Supervision;With upper extremity assist;From bed;From chair/3-in-1    Five time sit to stand comments  25.94, then 17.84   1st set slower, due to pt trying without hands 1-2 times.   Stand to Sit 5: Supervision;With upper  extremity assist;To bed;To chair/3-in-1    Number of Reps 2 sets;Other reps (comment)   5 reps     Standardized Balance Assessment   Standardized Balance Assessment Timed Up and Go Test      Timed Up and Go Test   TUG Normal TUG    Normal TUG (seconds) 33.28      Knee/Hip Exercises: Standing   Heel Raises 10 reps;Limitations;2 sets;Both    Heel Raises Limitations Heel raises/toe raises; limited in toe raises,. UE support on counterfor balance assistance    Knee Flexion AROM;Strengthening;Both;10 reps;Limitations;2 sets    Knee Flexion Limitations with UE support on counter, alternating HS curls for 10 reps each side.    Hip Abduction Stengthening;Right;Left;2 sets;10 reps;Knee straight    Abduction Limitations counter support support, cues for technique    Hip Extension Stengthening;Right;Left;10 reps;Limitations;2 sets    Extension Limitations At counter for support                 Balance Exercises - 06/19/21 0001       Balance Exercises: Standing   Stepping  Strategy Anterior;Lateral;Posterior;UE support;10 reps               PT Education - 06/19/21 0833     Education Details Fall prevention education provided; progress towards goals    Person(s) Educated Patient;Spouse    Methods Explanation;Handout    Comprehension Verbalized understanding              PT Short Term Goals - 06/19/21 0816       PT SHORT TERM GOAL #1   Title Pt will perform HEP with family supervision for improved strength, balance, transfers, and gait.  TARGET 06/20/2021    Time 4    Period Weeks    Status Achieved      PT SHORT TERM GOAL #2   Title Pt will improve 5x sit<>stand to less than or equal to 20 sec to demonstrate improved functional strength and transfer efficiency.    Baseline 25.03 sec; 17.84 sec 06/19/2021    Time 4    Period Weeks    Status Achieved      PT SHORT TERM GOAL #3   Title Pt will verbalize understanding of fall prevention in home environment.    Time 4    Period Weeks    Status Achieved      PT SHORT TERM GOAL #4   Title Pt will improve TUG score to less than or equal to 40 sec for decreased fall risk.    Baseline 51.22 sec; 33.28 sec 06/19/21    Time 8    Period Weeks    Status Achieved               PT Long Term Goals - 05/27/21 0906       PT LONG TERM GOAL #1   Title Pt will perform progression of HEP with family supervision for improved strength, balance, transfers, and gait.  TARGET 07/18/2021    Time 8    Period Weeks    Status New      PT LONG TERM GOAL #2   Title Pt will improve 5x sit<>stand to less than or equal to 15 sec to demonstrate improved functional strength and transfer efficiency.    Time 8    Period Weeks    Status New      PT LONG TERM GOAL #3   Title Pt will improve gait velocity to at least 1.3 ft/sefc for  improved gait efficiency and safety.    Baseline 0.82 ft/sec    Time 8    Period Weeks    Status New      PT LONG TERM GOAL #4   Title Pt will improve TUG score to less than  or equal to 30 sec for decreased fall risk.    Baseline 51.22 sec    Time 8    Period Weeks    Status New      PT LONG TERM GOAL #5   Title Pt will ambulate at least 500 ft, supervision, outdoor surfaces, with appropriat assistive device as needed, for improved community gait.    Time 8    Period Weeks    Status New                   Plan - 06/19/21 0820     Clinical Impression Statement Assessed STGs this visit, with pt meeting 4 of 4 STGs.  Pt demonstrates improvement in functional strength and in TUG score, demonstrateing improved balance/transfers.  Pt is progressing well with therapy; she does continue to remain at fall risk and would benefit from further skilled PT towards LTGs.    Personal Factors and Comorbidities Comorbidity 3+    Comorbidities PMH:  Alzheimer's, HTN, HLD, depression, anxiety, osteoporosis    Examination-Activity Limitations Locomotion Level;Transfers;Stairs;Stand    Examination-Participation Restrictions Community Activity;Other   Walking in the neighborhood   Stability/Clinical Decision Making Evolving/Moderate complexity    Rehab Potential Good    PT Frequency 2x / week    PT Duration 8 weeks   including eval week   PT Treatment/Interventions ADLs/Self Care Home Management;Gait training;Stair training;Functional mobility training;Therapeutic activities;Therapeutic exercise;Balance training;Neuromuscular re-education;Patient/family education;Passive range of motion;Manual techniques    PT Next Visit Plan Continue to progress HEP as appropriate for strength and balance; walking program for home.  Work on gait for increased step length and arm swing (?Use UpWalker in therapy session to help with posture, step length); use of seated stepper/NuStep for flexibility and strength    PT Home Exercise Plan Access Code: [email or web address]    Consulted and Agree with Plan of Care Patient;Family member/caregiver    Family Member Consulted son, Beryle Beams              Patient will benefit from skilled therapeutic intervention in order to improve the following deficits and impairments:  Abnormal gait, Difficulty walking, Decreased balance, Impaired flexibility, Decreased mobility, Decreased strength, Postural dysfunction  Visit Diagnosis: Muscle weakness (generalized)  Unsteadiness on feet  Other abnormalities of gait and mobility     Problem List Patient Active Problem List   Diagnosis Date Noted   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 06/19/2021, 8:45 AM Gean Maidens., PT   [facility] [address] [place], [place], [address] Phone: [phone]   Fax:  [phone]  Name: Brandi Walsh MRN: [id number] Date of Birth: 08-09-1946

## 2021-06-19 NOTE — Therapy (Signed)
[facility] [address] [place], [place], [address] Phone: [phone]   Fax:  [phone]  Occupational Therapy Treatment  Patient Details  Name: Brandi Walsh MRN: [id number] Date of Birth: 06/30/1946 No data recorded  Encounter Date: 06/19/2021   OT End of Session - 06/19/21 0850     Visit Number 3    Number of Visits 17    Date for OT Re-Evaluation 09/10/21    Authorization Type UHC MCR    Progress Note Due on Visit 10    OT Start Time 0845    OT Stop Time 0930    OT Time Calculation (min) 45 min    Activity Tolerance Patient tolerated treatment well    Behavior During Therapy [facility] for tasks assessed/performed             Past Medical History:  Diagnosis Date   Alzheimer's disease (HCC)    COUGH DUE TO ACE INHIBITORS 11/25/2009   Qualifier: Diagnosis of  By: Everardo All MD, Sean A    Depression    DIABETES MELLITUS, TYPE II 10/13/2007   Qualifier: Diagnosis of  By: Samara Snide     HYPERLIPIDEMIA 10/13/2007   Qualifier: Diagnosis of  By: Samara Snide     HYPERTENSION 10/13/2007   Qualifier: Diagnosis of  By: Samara Snide     MENOPAUSAL SYNDROME 10/13/2007   Qualifier: Diagnosis of  By: Charlsie Quest RMA, Lucy     OSTEOPOROSIS 10/13/2007   Qualifier: Diagnosis of  By: Samara Snide      Past Surgical History:  Procedure Laterality Date   ABDOMINAL HYSTERECTOMY     BREAST BIOPSY  08/2002   TONSILLECTOMY      There were no vitals filed for this visit.   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 Medical history includes: Alzheimers, HTN, HLD, depression, anxiety, osteoporosis    Patient Stated Goals improvement in Lt hand    Currently in Pain? No/denies             Treatment/Exercises -  06/19/21    Coordination Activities  Flipping playing cards over w/ emphasis on opening hand/extending fingers before retrieving cards off deck and during release of each card; used oversized cards w/ mod verbal and modeling to open hand each rep  Rotating small ball with fingertips, focusing on moving ball as much as possible each rep/turn. For success, OT repositioned ball, providing verbal cues to return to starting position, between each rep    AAROM: Sitting Chest press w/ unweighted dowel rod completed 2 sets of 10 reps to improve BUE strength, coordination, and ROM w/ verbal and visual cueing to emphasize increased size of movements  Pt also completed BUE shoulder flexion w/ unweighted dowel; 2x10    Large Amplitude Movements Large amplitude ipsilateral reaching to retrieve therapy cones, passing from hand-to-hand at midline/chest height w/ elbows extended, and placing on opposite ipsilateral side past set target for continued large amp movement  2nd set completed w/ pt reaching across midline to contralateral side and retrieving cones before placing them on ipsilateral side to incorporate weight shifting and trunk rotation; alternating UE each rep             OT Short Term Goals - 06/17/21  0867       OT SHORT TERM GOAL #1   Title Independent with HEP for BUE ROM/endurance and hand coordination    Time 4    Period Weeks    Status On-going      OT SHORT TERM GOAL #2   Title Pt/family to verbalize understanding with edema management strategies for Lt hand including compression glove    Time 4    Period Weeks    Status On-going      OT SHORT TERM GOAL #3   Title Pt to perform UB dressing/bathing with min assist    Time 4    Period Weeks    Status New      OT SHORT TERM GOAL #4   Title Pt to perform LB dressing/bathing with mod assist    Time 4    Period Weeks    Status New      OT SHORT TERM GOAL #5   Title Pt/family to verbalize understanding with memory  strategies and cognitive tips to increase participation in ADLS including some involvement in medication management    Time 4    Period Weeks    Status New             OT Long Term Goals - 06/10/21 1020       OT LONG TERM GOAL #1   Title Independent with updated HEP prn    Time 8    Period Weeks    Status New      OT LONG TERM GOAL #2   Title Pt to improve BUE function as evidenced by improving Box & Blocks score to 20 or greater    Baseline Rt/Lt = 17    Time 8    Period Weeks    Status New      OT LONG TERM GOAL #3   Title Pt to demo sufficient bilateral shoulder ROM to get shirt overhead and wash hair    Time 8    Period Weeks    Status New      OT LONG TERM GOAL #4   Title Pt to improve bilateral hand coordination as evidenced by reducing speed on 9 hole pegt est to 60 sec or under    Baseline Rt = 69.18 sec, Lt = 64.38 sec    Time 8    Period Weeks    Status New      OT LONG TERM GOAL #5   Title Pt to be overall min assist with BADLS    Time 8    Period Weeks    Status New      Long Term Additional Goals   Additional Long Term Goals Yes      OT LONG TERM GOAL #6   Title Pt to perform simple dyanmic standing task (brushing teeth, folding laundry) for 10 min. w/o LOB using one hand countertop support prn    Time 8    Period Weeks    Status New             Plan - 06/19/21 0850     Clinical Impression Statement Pt did well w/ large amplitude GM movement this session, requiring min verbal/gestural cues for sequencing, but did experience increased difficulty w/ hand coordination activities (cog impairments likely a contributing factor). OT also reviewed HEP shoulder exercises w/ pt and husband.    OT Occupational Profile and History Detailed Assessment- Review of Records and additional review of physical, cognitive, psychosocial history related to  current functional performance    Occupational performance deficits (Please refer to evaluation for details):  ADL's;IADL's;Social Participation    Body Structure / Function / Physical Skills ADL;Decreased knowledge of use of DME;Strength;Balance;Dexterity;GMC;Tone;Edema;Body mechanics;UE functional use;IADL;ROM;Sensation;Mobility;Flexibility;Coordination;Decreased knowledge of precautions;FMC    Cognitive Skills Energy/Drive;Memory;Problem Solve;Safety Awareness    Rehab Potential Good    Clinical Decision Making Several treatment options, min-mod task modification necessary    Comorbidities Affecting Occupational Performance: Presence of comorbidities impacting occupational performance    Comorbidities impacting occupational performance description: Alzheimers, osteoporosis    Modification or Assistance to Complete Evaluation  Min-Moderate modification of tasks or assist with assess necessary to complete eval    OT Frequency 2x / week    OT Duration 8 weeks   or 16 visits over 3 months (d/t potential scheduling and transportation conflicts), pluse eval   OT Treatment/Interventions Self-care/ADL training;DME and/or AE instruction;Splinting;Compression bandaging;Therapeutic activities;Ultrasound;Therapeutic exercise;Cognitive remediation/compensation;Coping strategies training;Neuromuscular education;Functional Mobility Training;Passive range of motion;Visual/perceptual remediation/compensation;Manual Therapy;Patient/family education    Plan review sh HEP, issue coordination HEP (4 ex's or less), and continue family education on allowing her to participate more for BADLS w/ sup and assist only when needed.    Consulted and Agree with Plan of Care Patient;Family member/caregiver    Family Member Consulted Son             Patient will benefit from skilled therapeutic intervention in order to improve the following deficits and impairments:   Body Structure / Function / Physical Skills: ADL, Decreased knowledge of use of DME, Strength, Balance, Dexterity, GMC, Tone, Edema, Body mechanics, UE functional use,  IADL, ROM, Sensation, Mobility, Flexibility, Coordination, Decreased knowledge of precautions, FMC Cognitive Skills: Energy/Drive, Memory, Problem Solve, Safety Awareness   Visit Diagnosis: Muscle weakness (generalized)  Other lack of coordination  Other symptoms and signs involving the musculoskeletal system  Other symptoms and signs involving the nervous system    Problem List Patient Active Problem List   Diagnosis Date Noted   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   Abnormal liver enzymes 09/19/2019   Blurred vision 09/19/2019   Hypercalcemia 03/15/2019   Major depression in full remission (HCC) 11/24/2018   Memory loss 12/24/2016   Encounter for well adult exam with abnormal findings 06/13/2013   Hyperparathyroidism, primary (HCC) 06/12/2013   GERD (gastroesophageal reflux disease) 02/19/2012   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 [place], [place], [address] Phone: [phone]   Fax:  [phone]  Name: Brandi Walsh MRN: [id number] Date of Birth: Feb 10, 1946

## 2021-06-24 ENCOUNTER — Ambulatory Visit: Payer: Medicare Other | Admitting: Occupational Therapy

## 2021-06-24 ENCOUNTER — Other Ambulatory Visit: Payer: Self-pay

## 2021-06-24 ENCOUNTER — Encounter: Payer: Self-pay | Admitting: Physical Therapy

## 2021-06-24 ENCOUNTER — Ambulatory Visit: Payer: Medicare Other | Admitting: Physical Therapy

## 2021-06-24 DIAGNOSIS — R278 Other lack of coordination: Secondary | ICD-10-CM

## 2021-06-24 DIAGNOSIS — R293 Abnormal posture: Secondary | ICD-10-CM

## 2021-06-24 DIAGNOSIS — R2681 Unsteadiness on feet: Secondary | ICD-10-CM

## 2021-06-24 DIAGNOSIS — R41840 Attention and concentration deficit: Secondary | ICD-10-CM

## 2021-06-24 DIAGNOSIS — R29898 Other symptoms and signs involving the musculoskeletal system: Secondary | ICD-10-CM

## 2021-06-24 DIAGNOSIS — R600 Localized edema: Secondary | ICD-10-CM

## 2021-06-24 DIAGNOSIS — R2689 Other abnormalities of gait and mobility: Secondary | ICD-10-CM

## 2021-06-24 DIAGNOSIS — M6281 Muscle weakness (generalized): Secondary | ICD-10-CM

## 2021-06-24 NOTE — Therapy (Signed)
[facility] [address] [place], [place], [address] Phone: [phone]   Fax:  [phone]  Occupational Therapy Treatment  Patient Details  Name: Brandi Walsh MRN: [id number] Date of Birth: 27-Apr-1946 No data recorded  Encounter Date: 06/24/2021   OT End of Session - 06/24/21 0852     Visit Number 4    Number of Visits 17    Date for OT Re-Evaluation 09/10/21    Authorization Type [employer] MCR    Progress Note Due on Visit 10    OT Start Time 0850    OT Stop Time 0930    OT Time Calculation (min) 40 min             Past Medical History:  Diagnosis Date   Alzheimer's disease (HCC)    COUGH DUE TO ACE INHIBITORS 11/25/2009   Qualifier: Diagnosis of  By: Everardo All MD, Sean A    Depression    DIABETES MELLITUS, TYPE II 10/13/2007   Qualifier: Diagnosis of  By: Samara Snide     HYPERLIPIDEMIA 10/13/2007   Qualifier: Diagnosis of  By: Samara Snide     HYPERTENSION 10/13/2007   Qualifier: Diagnosis of  By: Samara Snide     MENOPAUSAL SYNDROME 10/13/2007   Qualifier: Diagnosis of  By: Charlsie Quest RMA, Lucy     OSTEOPOROSIS 10/13/2007   Qualifier: Diagnosis of  By: Samara Snide      Past Surgical History:  Procedure Laterality Date   ABDOMINAL HYSTERECTOMY     BREAST BIOPSY  08/2002   TONSILLECTOMY      There were no vitals filed for this visit.   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 Medical history includes: Alzheimers, HTN, HLD, depression, anxiety, osteoporosis    Currently in Pain? No/denies                     Treatment: Functional reaching with trunk rotation in seated , mod v.c for functional reach and manipulation of clothespins in hand. Arm bike x 5 mins for conditioning and reciprocal motion.  Flipping playing cards with left and right UE's for improved  coordination, mod v.c               OT Education - 06/24/21 0922     Education Details bag exercises for simulated ADLS- simulated crumpling for donning socks, behind back for tucking in shirt, behind head for donning shirt- issued as HEP, (pt also  performed stepping without bag and touched opposite ankle for simulated donning pants- did not issue as a part of HEP)    Person(s) Educated Patient;Spouse    Comprehension Verbalized understanding;Returned demonstration;Verbal cues required;Need further instruction              OT Short Term Goals - 06/17/21 0937       OT SHORT TERM GOAL #1   Title Independent with HEP for BUE ROM/endurance and hand coordination    Time 4    Period Weeks    Status On-going      OT SHORT TERM GOAL #2   Title Pt/family to verbalize understanding with edema management strategies for Lt hand including compression glove    Time 4    Period Weeks    Status On-going      OT SHORT TERM GOAL #3   Title Pt  to perform UB dressing/bathing with min assist    Time 4    Period Weeks    Status New      OT SHORT TERM GOAL #4   Title Pt to perform LB dressing/bathing with mod assist    Time 4    Period Weeks    Status New      OT SHORT TERM GOAL #5   Title Pt/family to verbalize understanding with memory strategies and cognitive tips to increase participation in ADLS including some involvement in medication management    Time 4    Period Weeks    Status New               OT Long Term Goals - 06/10/21 1020       OT LONG TERM GOAL #1   Title Independent with updated HEP prn    Time 8    Period Weeks    Status New      OT LONG TERM GOAL #2   Title Pt to improve BUE function as evidenced by improving Box & Blocks score to 20 or greater    Baseline Rt/Lt = 17    Time 8    Period Weeks    Status New      OT LONG TERM GOAL #3   Title Pt to demo sufficient bilateral shoulder ROM to get shirt overhead and wash hair    Time 8     Period Weeks    Status New      OT LONG TERM GOAL #4   Title Pt to improve bilateral hand coordination as evidenced by reducing speed on 9 hole pegt est to 60 sec or under    Baseline Rt = 69.18 sec, Lt = 64.38 sec    Time 8    Period Weeks    Status New      OT LONG TERM GOAL #5   Title Pt to be overall min assist with BADLS    Time 8    Period Weeks    Status New      Long Term Additional Goals   Additional Long Term Goals Yes      OT LONG TERM GOAL #6   Title Pt to perform simple dyanmic standing task (brushing teeth, folding laundry) for 10 min. w/o LOB using one hand countertop support prn    Time 8    Period Weeks    Status New                   Plan - 06/24/21 4270     Clinical Impression Statement Pt is progressing towards goals however she remains limited by cognition and rigidity.    OT Occupational Profile and History Detailed Assessment- Review of Records and additional review of physical, cognitive, psychosocial history related to current functional performance    Occupational performance deficits (Please refer to evaluation for details): ADL's;IADL's;Social Participation    Body Structure / Function / Physical Skills ADL;Decreased knowledge of use of DME;Strength;Balance;Dexterity;GMC;Tone;Edema;Body mechanics;UE functional use;IADL;ROM;Sensation;Mobility;Flexibility;Coordination;Decreased knowledge of precautions;FMC    Cognitive Skills Energy/Drive;Memory;Problem Solve;Safety Awareness    Rehab Potential Good    Clinical Decision Making Several treatment options, min-mod task modification necessary    Comorbidities Affecting Occupational Performance: Presence of comorbidities impacting occupational performance    Comorbidities impacting occupational performance description: Alzheimers, osteoporosis    Modification or Assistance to Complete Evaluation  Min-Moderate modification of tasks or assist with assess necessary to complete eval    OT  Frequency 2x  / week    OT Duration 8 weeks   or 16 visits over 3 months (d/t potential scheduling and transportation conflicts), pluse eval   OT Treatment/Interventions Self-care/ADL training;DME and/or AE instruction;Splinting;Compression bandaging;Therapeutic activities;Ultrasound;Therapeutic exercise;Cognitive remediation/compensation;Coping strategies training;Neuromuscular education;Functional Mobility Training;Passive range of motion;Visual/perceptual remediation/compensation;Manual Therapy;Patient/family education    Plan issue coordination HEP (4 ex's or less), and continue family education on allowing her to participate more for BADLS w/ sup and assist only when needed.    Consulted and Agree with Plan of Care Patient;Family member/caregiver    Family Member Consulted Son             Patient will benefit from skilled therapeutic intervention in order to improve the following deficits and impairments:   Body Structure / Function / Physical Skills: ADL, Decreased knowledge of use of DME, Strength, Balance, Dexterity, GMC, Tone, Edema, Body mechanics, UE functional use, IADL, ROM, Sensation, Mobility, Flexibility, Coordination, Decreased knowledge of precautions, FMC Cognitive Skills: Energy/Drive, Memory, Problem Solve, Safety Awareness     Visit Diagnosis: Muscle weakness (generalized)  Other lack of coordination  Other symptoms and signs involving the musculoskeletal system  Localized edema  Attention and concentration deficit  Abnormal posture  Other abnormalities of gait and mobility    Problem List Patient Active Problem List   Diagnosis Date Noted   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   Abnormal liver enzymes 09/19/2019   Blurred vision 09/19/2019   Hypercalcemia 03/15/2019   Major depression in full remission (HCC) 11/24/2018   Memory loss 12/24/2016   Encounter for well adult exam with abnormal findings 06/13/2013   Hyperparathyroidism, primary (HCC) 06/12/2013   GERD  (gastroesophageal reflux disease) 02/19/2012   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

## 2021-06-24 NOTE — Therapy (Signed)
[facility] [address] [place], [place], [address] Phone: [phone]   Fax:  [phone]  Physical Therapy Treatment  Patient Details  Name: Brandi Walsh MRN: [id number] Date of Birth: 01-15-1946 Referring Provider (PT): Patrcia Dolly   Encounter Date: 06/24/2021   PT End of Session - 06/24/21 0803     Visit Number 6    Number of Visits 16    Date for PT Re-Evaluation 07/18/21    Authorization Type UHC medicare    Progress Note Due on Visit 10    PT Start Time 0804    PT Stop Time 0843    PT Time Calculation (min) 39 min    Equipment Utilized During Treatment Gait belt    Activity Tolerance Patient tolerated treatment well    Behavior During Therapy [facility] for tasks assessed/performed             Past Medical History:  Diagnosis Date   Alzheimer's disease (HCC)    COUGH DUE TO ACE INHIBITORS 11/25/2009   Qualifier: Diagnosis of  By: Everardo All MD, Sean A    Depression    DIABETES MELLITUS, TYPE II 10/13/2007   Qualifier: Diagnosis of  By: Samara Snide     HYPERLIPIDEMIA 10/13/2007   Qualifier: Diagnosis of  By: Samara Snide     HYPERTENSION 10/13/2007   Qualifier: Diagnosis of  By: Samara Snide     MENOPAUSAL SYNDROME 10/13/2007   Qualifier: Diagnosis of  By: Charlsie Quest RMA, Lucy     OSTEOPOROSIS 10/13/2007   Qualifier: Diagnosis of  By: Samara Snide      Past Surgical History:  Procedure Laterality Date   ABDOMINAL HYSTERECTOMY     BREAST BIOPSY  08/2002   TONSILLECTOMY      There were no vitals filed for this visit.   Subjective Assessment - 06/24/21 0803     Subjective No changes, no falls to report.    Patient is accompained by: Family member   spouse in lobby   Pertinent History MH:  Alzheimer's, HTN, HLD, depression, anxiety, osteoporosis    Patient Stated Goals Pt's goals are to walk better.    Currently in Pain? No/denies                                [facility] Adult PT Treatment/Exercise - 06/24/21 0001       Ambulation/Gait   Ambulation/Gait Yes    Ambulation/Gait Assistance 4: Min guard    Ambulation/Gait Assistance Details Using clinic UpWalker    Ambulation Distance (Feet) 250 Feet    Assistive device Other (Comment)   UpWalker   Gait Pattern Step-through pattern;Decreased arm swing - right;Decreased arm swing - left;Step-to pattern;Decreased step length - right;Decreased step length - left;Decreased dorsiflexion - right;Decreased dorsiflexion - left;Right flexed knee in stance;Left flexed knee in stance;Trunk flexed;Wide base of support;Poor foot clearance - left;Poor foot clearance - right    Ambulation Surface Level;Indoor    Gait Comments Additional gait in session 80 ft x 2, 60 ft x 2 no device with cues for increased step length.      Knee/Hip Exercises: Aerobic   Nustep NuStep, Level 2, 4 extremities, x 5 minutes, cues for keeping steps/minute > 50.  For flexibility and strengthening.      Knee/Hip Exercises: Standing   Heel Raises 10 reps;Limitations;2 sets;Both    Heel Raises Limitations Heel raises/toe raises; limited in  toe raises,. UE support on counterfor balance assistance    Knee Flexion AROM;Strengthening;Both;10 reps;Limitations;2 sets    Knee Flexion Limitations with UE support on counter, alternating HS curls for 10 reps each side.    Hip Flexion Stengthening;Right;Left;10 reps;Knee bent;Limitations;2 sets    Hip Flexion Limitations holding chair for UE support, alternating LE's    Hip Abduction Stengthening;Right;Left;2 sets;10 reps;Knee straight    Abduction Limitations counter support support, cues for technique    Hip Extension Stengthening;Right;Left;10 reps;Limitations;2 sets    Extension Limitations At counter for support                    PT Education - 06/24/21 0843     Education Details Updates to HEP-see instructions    Person(s) Educated Patient;Spouse    Methods  Explanation;Demonstration;Handout;Verbal cues    Comprehension Verbalized understanding;Returned demonstration;Verbal cues required              PT Short Term Goals - 06/19/21 0816       PT SHORT TERM GOAL #1   Title Pt will perform HEP with family supervision for improved strength, balance, transfers, and gait.  TARGET 06/20/2021    Time 4    Period Weeks    Status Achieved      PT SHORT TERM GOAL #2   Title Pt will improve 5x sit<>stand to less than or equal to 20 sec to demonstrate improved functional strength and transfer efficiency.    Baseline 25.03 sec; 17.84 sec 06/19/2021    Time 4    Period Weeks    Status Achieved      PT SHORT TERM GOAL #3   Title Pt will verbalize understanding of fall prevention in home environment.    Time 4    Period Weeks    Status Achieved      PT SHORT TERM GOAL #4   Title Pt will improve TUG score to less than or equal to 40 sec for decreased fall risk.    Baseline 51.22 sec; 33.28 sec 06/19/21    Time 8    Period Weeks    Status Achieved               PT Long Term Goals - 05/27/21 0906       PT LONG TERM GOAL #1   Title Pt will perform progression of HEP with family supervision for improved strength, balance, transfers, and gait.  TARGET 07/18/2021    Time 8    Period Weeks    Status New      PT LONG TERM GOAL #2   Title Pt will improve 5x sit<>stand to less than or equal to 15 sec to demonstrate improved functional strength and transfer efficiency.    Time 8    Period Weeks    Status New      PT LONG TERM GOAL #3   Title Pt will improve gait velocity to at least 1.3 ft/sefc for improved gait efficiency and safety.    Baseline 0.82 ft/sec    Time 8    Period Weeks    Status New      PT LONG TERM GOAL #4   Title Pt will improve TUG score to less than or equal to 30 sec for decreased fall risk.    Baseline 51.22 sec    Time 8    Period Weeks    Status New      PT LONG TERM GOAL #5   Title Pt will  ambulate at  least 500 ft, supervision, outdoor surfaces, with appropriat assistive device as needed, for improved community gait.    Time 8    Period Weeks    Status New                   Plan - 06/24/21 0859     Clinical Impression Statement Skilled PT session focused on additions to HEP for standing strengthening exercises.  Husband present at end of session for education in HEP additions.  Pt responds well to cues for posture and step length.  She is able to increase time on aerobic machine to 5 minutes continuous for lower extremity strength and flexibility.  She will continue to benefit from skilled PT to further address strength, balance, gait for improved overall funcitonal mobility.    Personal Factors and Comorbidities Comorbidity 3+    Comorbidities PMH:  Alzheimer's, HTN, HLD, depression, anxiety, osteoporosis    Examination-Activity Limitations Locomotion Level;Transfers;Stairs;Stand    Examination-Participation Restrictions Community Activity;Other   Walking in the neighborhood   Stability/Clinical Decision Making Evolving/Moderate complexity    Rehab Potential Good    PT Frequency 2x / week    PT Duration 8 weeks   including eval week   PT Treatment/Interventions ADLs/Self Care Home Management;Gait training;Stair training;Functional mobility training;Therapeutic activities;Therapeutic exercise;Balance training;Neuromuscular re-education;Patient/family education;Passive range of motion;Manual techniques    PT Next Visit Plan Review updates to HEP; consider walking program for home.  Work on gait for increased step length and arm swing (Use UpWalker versus rollator in therapy session to help with posture, step length); use of seated stepper/NuStep for flexibility and strength    PT Home Exercise Plan Access Code: [email or web address]    Consulted and Agree with Plan of Care Patient;Family member/caregiver    Family Member Consulted husband at end of session             Patient will benefit  from skilled therapeutic intervention in order to improve the following deficits and impairments:  Abnormal gait, Difficulty walking, Decreased balance, Impaired flexibility, Decreased mobility, Decreased strength, Postural dysfunction  Visit Diagnosis: Muscle weakness (generalized)  Other abnormalities of gait and mobility  Unsteadiness on feet     Problem List Patient Active Problem List   Diagnosis Date Noted   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 06/24/2021, 9:06 AM Gean Maidens., PT  [facility] [address] [place], [place], [address] Phone: [phone]   Fax:  [phone]  Name: SANTOS HARDWICK MRN: [id number] Date of Birth: 1946-07-28

## 2021-06-24 NOTE — Patient Instructions (Signed)
Bag Exercises:  °Small trash bag or produce bag works best. ° °For all exercises, sit with big posture (sit up tall with head up) and use big movements. Perform the following exercises 1 times per day. ° °Hold end of bag in one hand. Stretch fingers out big to draw the entire bag into your palm. Repeat 3 times with each hand. °Hold bag in one hand. Stretch both arms/hands out to the side as big as you can. Then, pass bag from one hand to the other BEHIND you. Stretch arms back out big after each pass. Repeat 10 times. °Hold bag in both hands in front of you with hands/arms shoulder length apart. Move bag behind your head. Repeat 10 times. °

## 2021-06-24 NOTE — Patient Instructions (Signed)
Access Code: [email or web address] URL: [email or web address] Date: 06/24/2021 Prepared by: Lonia Blood  Exercises Seated Long Arc Quad - 1-2 x daily - 7 x weekly - 1-2 sets - 10 reps Seated Ankle Pumps on Table - 1-2 x daily - 7 x weekly - 1-2 sets - 10 reps Seated Hamstring Stretch - 1-2 x daily - 7 x weekly - 1 sets - 3 reps - 30 sec hold Standing March with Counter Support - 1 x daily - 5 x weekly - 1-2 sets - 10 reps Side to side weightshift - 1 x daily - 5 x weekly - 1-2 sets - 10 reps  Added 06/24/2021 Heel Toe Raises with Counter Support - 1 x daily - 5 x weekly - 2 sets - 10 reps Standing Hip Abduction with Counter Support - 1 x daily - 5 x weekly - 2 sets - 10 reps Standing Hip Extension with Counter Support - 1 x daily - 5 x weekly - 2 sets - 10 reps

## 2021-06-26 ENCOUNTER — Ambulatory Visit: Payer: Medicare Other | Admitting: Occupational Therapy

## 2021-06-26 ENCOUNTER — Other Ambulatory Visit: Payer: Self-pay

## 2021-06-26 ENCOUNTER — Encounter: Payer: Self-pay | Admitting: Occupational Therapy

## 2021-06-26 ENCOUNTER — Ambulatory Visit: Payer: Medicare Other | Admitting: Physical Therapy

## 2021-06-26 DIAGNOSIS — M6281 Muscle weakness (generalized): Secondary | ICD-10-CM | POA: Diagnosis not present

## 2021-06-26 DIAGNOSIS — R293 Abnormal posture: Secondary | ICD-10-CM

## 2021-06-26 DIAGNOSIS — R29898 Other symptoms and signs involving the musculoskeletal system: Secondary | ICD-10-CM

## 2021-06-26 DIAGNOSIS — R29818 Other symptoms and signs involving the nervous system: Secondary | ICD-10-CM

## 2021-06-26 DIAGNOSIS — R41840 Attention and concentration deficit: Secondary | ICD-10-CM

## 2021-06-26 DIAGNOSIS — R278 Other lack of coordination: Secondary | ICD-10-CM

## 2021-06-26 DIAGNOSIS — R2689 Other abnormalities of gait and mobility: Secondary | ICD-10-CM

## 2021-06-26 DIAGNOSIS — R2681 Unsteadiness on feet: Secondary | ICD-10-CM

## 2021-06-26 NOTE — Therapy (Signed)
[facility] [address] [place], [place], [address] Phone: [phone]   Fax:  [phone]  Physical Therapy Treatment  Patient Details  Name: Brandi Walsh MRN: [id number] Date of Birth: [birth year] Referring Provider (PT): Patrcia Dolly   Encounter Date: 06/26/2021   PT End of Session - 06/26/21 0807     Visit Number 7    Number of Visits 16    Date for PT Re-Evaluation 07/18/21    Authorization Type UHC medicare    Progress Note Due on Visit 10    PT Start Time 0804    PT Stop Time 0844    PT Time Calculation (min) 40 min    Equipment Utilized During Treatment Gait belt    Activity Tolerance Patient tolerated treatment well    Behavior During Therapy [facility] for tasks assessed/performed             Past Medical History:  Diagnosis Date   Alzheimer's disease (HCC)    COUGH DUE TO ACE INHIBITORS 11/25/2009   Qualifier: Diagnosis of  By: Everardo All MD, Sean A    Depression    DIABETES MELLITUS, TYPE II 10/13/2007   Qualifier: Diagnosis of  By: Samara Snide     HYPERLIPIDEMIA 10/13/2007   Qualifier: Diagnosis of  By: Samara Snide     HYPERTENSION 10/13/2007   Qualifier: Diagnosis of  By: Samara Snide     MENOPAUSAL SYNDROME 10/13/2007   Qualifier: Diagnosis of  By: Charlsie Quest RMA, Lucy     OSTEOPOROSIS 10/13/2007   Qualifier: Diagnosis of  By: Samara Snide      Past Surgical History:  Procedure Laterality Date   ABDOMINAL HYSTERECTOMY     BREAST BIOPSY  08/2002   TONSILLECTOMY      There were no vitals filed for this visit.   Subjective Assessment - 06/26/21 0807     Subjective No changes, no falls to report.  Did my new exercises.    Patient is accompained by: Family member   spouse in lobby   Pertinent History MH:  Alzheimer's, HTN, HLD, depression, anxiety, osteoporosis    Patient Stated Goals Pt's goals are to walk better.    Currently in Pain? No/denies                                [facility] Adult PT Treatment/Exercise - 06/26/21 0001       Transfers   Transfers Sit to Stand;Stand to Sit    Sit to Stand 5: Supervision;With upper extremity assist;From bed;From chair/3-in-1    Stand to Sit 5: Supervision;With upper extremity assist;To bed;To chair/3-in-1    Number of Reps 2 sets;Other reps (comment)   5 reps from mat, progressing to no UE support at mat     Ambulation/Gait   Ambulation/Gait Yes    Ambulation/Gait Assistance 4: Min guard    Ambulation/Gait Assistance Details Nodevice in and out of therapy; then trialed rollator    Ambulation Distance (Feet) 80 Feet   230 ft x 2; then 200 ft after seated rest   Assistive device Rollator    Gait Pattern Step-through pattern;Decreased arm swing - right;Decreased arm swing - left;Step-to pattern;Decreased step length - right;Decreased step length - left;Decreased dorsiflexion - right;Decreased dorsiflexion - left;Right flexed knee in stance;Left flexed knee in stance;Trunk flexed;Wide base of support;Poor foot clearance - left;Poor foot clearance - right    Ambulation Surface Level;Indoor  Gait Comments Cues to stay close to rollator, for increased step length and to widen BOS at times (during curves/turns)      Knee/Hip Exercises: Standing   Functional Squat 1 set;10 reps            Reviewed HEP given last, with pt return demo understanding with supervision  Heel Toe Raises with Counter Support - 1 x daily - 5 x weekly - 2 sets - 10 reps Standing Hip Abduction with Counter Support - 1 x daily - 5 x weekly - 2 sets - 10 reps Standing Hip Extension with Counter Support - 1 x daily - 5 x weekly - 2 sets - 10 reps           Balance Exercises - 06/26/21 0001       Balance Exercises: Standing   SLS with Vectors Solid surface;Upper extremity assist 1;Limitations;Other reps (comment)    SLS with Vectors Limitations Alt step taps at 4" step, 1 UE support, 2 sets x 10    Retro  Gait Upper extremity support;3 reps;Limitations    Retro Gait Limitations Forward/back at counter, cues for step length, widened BOS; counting steps as a cue for larger steps-pt able to go from 8 steps forward along counter to 5-6 steps    Sidestepping Upper extremity support;3 reps;Limitations    Sidestepping Limitations R and L at counter, cues for larger step length (pt goes from 8 steps each direction, to 6, then 5 with cues for increased step length).                 PT Short Term Goals - 06/19/21 0816       PT SHORT TERM GOAL #1   Title Pt will perform HEP with family supervision for improved strength, balance, transfers, and gait.  TARGET 06/20/2021    Time 4    Period Weeks    Status Achieved      PT SHORT TERM GOAL #2   Title Pt will improve 5x sit<>stand to less than or equal to 20 sec to demonstrate improved functional strength and transfer efficiency.    Baseline 25.03 sec; 17.84 sec 06/19/2021    Time 4    Period Weeks    Status Achieved      PT SHORT TERM GOAL #3   Title Pt will verbalize understanding of fall prevention in home environment.    Time 4    Period Weeks    Status Achieved      PT SHORT TERM GOAL #4   Title Pt will improve TUG score to less than or equal to 40 sec for decreased fall risk.    Baseline 51.22 sec; 33.28 sec 06/19/21    Time 8    Period Weeks    Status Achieved               PT Long Term Goals - 05/27/21 0906       PT LONG TERM GOAL #1   Title Pt will perform progression of HEP with family supervision for improved strength, balance, transfers, and gait.  TARGET 07/18/2021    Time 8    Period Weeks    Status New      PT LONG TERM GOAL #2   Title Pt will improve 5x sit<>stand to less than or equal to 15 sec to demonstrate improved functional strength and transfer efficiency.    Time 8    Period Weeks    Status New  PT LONG TERM GOAL #3   Title Pt will improve gait velocity to at least 1.3 ft/sefc for improved gait  efficiency and safety.    Baseline 0.82 ft/sec    Time 8    Period Weeks    Status New      PT LONG TERM GOAL #4   Title Pt will improve TUG score to less than or equal to 30 sec for decreased fall risk.    Baseline 51.22 sec    Time 8    Period Weeks    Status New      PT LONG TERM GOAL #5   Title Pt will ambulate at least 500 ft, supervision, outdoor surfaces, with appropriat assistive device as needed, for improved community gait.    Time 8    Period Weeks    Status New                   Plan - 06/26/21 6767     Clinical Impression Statement REviewed HEP from last visit and pt return demo understanding.  Trialed rollator walker today in PT session (versus UpWalker) and both patient and PT feel this is a better option for longer distance walker.  She will need further practice and continued supervision for rollator, but it may be beneficial for longer walking distances in community.    Personal Factors and Comorbidities Comorbidity 3+    Comorbidities PMH:  Alzheimer's, HTN, HLD, depression, anxiety, osteoporosis    Examination-Activity Limitations Locomotion Level;Transfers;Stairs;Stand    Examination-Participation Restrictions Community Activity;Other   Walking in the neighborhood   Stability/Clinical Decision Making Evolving/Moderate complexity    Rehab Potential Good    PT Frequency 2x / week    PT Duration 8 weeks   including eval week   PT Treatment/Interventions ADLs/Self Care Home Management;Gait training;Stair training;Functional mobility training;Therapeutic activities;Therapeutic exercise;Balance training;Neuromuscular re-education;Patient/family education;Passive range of motion;Manual techniques    PT Next Visit Plan Consider walking program for home.  Work on gait for increased step length and arm swing (Use UpWalker versus rollator in therapy session to help with posture, step length); use of seated stepper/NuStep for flexibility and strength    PT Home  Exercise Plan Access Code: [email or web address]    Consulted and Agree with Plan of Care Patient             Patient will benefit from skilled therapeutic intervention in order to improve the following deficits and impairments:  Abnormal gait, Difficulty walking, Decreased balance, Impaired flexibility, Decreased mobility, Decreased strength, Postural dysfunction  Visit Diagnosis: Other abnormalities of gait and mobility  Muscle weakness (generalized)  Unsteadiness on feet     Problem List Patient Active Problem List   Diagnosis Date Noted   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 06/26/2021, 8:48 AM Gean Maidens., PT  [facility] [place] [address] [place], [place], [address] Phone: [phone]   Fax:  [phone]  Name: Brandi Walsh MRN: [id number] Date  of Birth: 1946/05/17

## 2021-06-26 NOTE — Therapy (Signed)
[facility] [address] [place], [place], [address] Phone: [phone]   Fax:  [phone]  Occupational Therapy Treatment  Patient Details  Name: Brandi Walsh MRN: [id number] Date of Birth: [birth year] No data recorded  Encounter Date: 06/26/2021   OT End of Session - 06/26/21 0850     Visit Number 5    Number of Visits 17    Date for OT Re-Evaluation 09/10/21    Authorization Type UHC MCR    Progress Note Due on Visit 10    OT Start Time 0847    OT Stop Time 0928    OT Time Calculation (min) 41 min    Activity Tolerance Patient tolerated treatment well    Behavior During Therapy [facility] for tasks assessed/performed             Past Medical History:  Diagnosis Date   Alzheimer's disease (HCC)    COUGH DUE TO ACE INHIBITORS 11/25/2009   Qualifier: Diagnosis of  By: Everardo All MD, Sean A    Depression    DIABETES MELLITUS, TYPE II 10/13/2007   Qualifier: Diagnosis of  By: Samara Snide     HYPERLIPIDEMIA 10/13/2007   Qualifier: Diagnosis of  By: Samara Snide     HYPERTENSION 10/13/2007   Qualifier: Diagnosis of  By: Samara Snide     MENOPAUSAL SYNDROME 10/13/2007   Qualifier: Diagnosis of  By: Charlsie Quest RMA, Lucy     OSTEOPOROSIS 10/13/2007   Qualifier: Diagnosis of  By: Samara Snide      Past Surgical History:  Procedure Laterality Date   ABDOMINAL HYSTERECTOMY     BREAST BIOPSY  08/2002   TONSILLECTOMY      There were no vitals filed for this visit.   Subjective Assessment - 06/26/21 0850     Subjective  Pt reports she has been practicing her bag exercises at home    Pertinent History Parkinsonism (decline over last 2 years). Medical history includes: Alzheimers, HTN, HLD, depression, anxiety, osteoporosis    Currently in Pain? No/denies             Treatment/Exercises - 06/26/21    Simulated ADLs Bag exercises for simulated ADLs - over the head for donning shirt, behind the back  for tucking in/pulling down shirt, under the legs for donning/doffing pants, and crumpling for donning socks    Large Amplitude Movement Alternating crossbody reaching to retrieve therapy cones and then place on ipsilateral side; completed 15x w/ each UE while sitting EOM for weight shifting and trunk rotation; occ verbal cues for sequencing    Overhead Reaching Removing/placing resistance clothespins (1-4#) to facilitate functional overhead reach w/ alternating UEs in sitting, and increased control/coordination/strength. 2nd set pt only placed green on vertical pole w/ alternating UEs; min verbal cues to reposition clothespin in-hand and min difficulty retrieving clothespins from bottom level            OT Education - 06/26/21 0855     Education Details Reviewed bag exercises to simulate ADLs    Person(s) Educated Patient    Methods Demonstration;Verbal cues    Comprehension Verbalized understanding;Verbal cues required;Need further instruction;Returned demonstration             OT Short Term Goals - 06/17/21 0937       OT SHORT TERM GOAL #1   Title Independent with HEP for BUE ROM/endurance and hand coordination    Time 4    Period Weeks  Status On-going      OT SHORT TERM GOAL #2   Title Pt/family to verbalize understanding with edema management strategies for Lt hand including compression glove    Time 4    Period Weeks    Status On-going      OT SHORT TERM GOAL #3   Title Pt to perform UB dressing/bathing with min assist    Time 4    Period Weeks    Status New      OT SHORT TERM GOAL #4   Title Pt to perform LB dressing/bathing with mod assist    Time 4    Period Weeks    Status New      OT SHORT TERM GOAL #5   Title Pt/family to verbalize understanding with memory strategies and cognitive tips to increase participation in ADLS including some involvement in medication management    Time 4    Period Weeks    Status New             OT Long Term Goals  - 06/10/21 1020       OT LONG TERM GOAL #1   Title Independent with updated HEP prn    Time 8    Period Weeks    Status New      OT LONG TERM GOAL #2   Title Pt to improve BUE function as evidenced by improving Box & Blocks score to 20 or greater    Baseline Rt/Lt = 17    Time 8    Period Weeks    Status New      OT LONG TERM GOAL #3   Title Pt to demo sufficient bilateral shoulder ROM to get shirt overhead and wash hair    Time 8    Period Weeks    Status New      OT LONG TERM GOAL #4   Title Pt to improve bilateral hand coordination as evidenced by reducing speed on 9 hole pegt est to 60 sec or under    Baseline Rt = 69.18 sec, Lt = 64.38 sec    Time 8    Period Weeks    Status New      OT LONG TERM GOAL #5   Title Pt to be overall min assist with BADLS    Time 8    Period Weeks    Status New      Long Term Additional Goals   Additional Long Term Goals Yes      OT LONG TERM GOAL #6   Title Pt to perform simple dyanmic standing task (brushing teeth, folding laundry) for 10 min. w/o LOB using one hand countertop support prn    Time 8    Period Weeks    Status New             Plan - 06/26/21 1275     Clinical Impression Statement Pt demo'd good understanding of bag exercises this session and was able to complete neuro reed/large amplitude movements and clothespins activity w/ min verbal/tactile cueing for sequencing. Pt requires extended time for processing and HHA for ambulation in/out of session.    OT Occupational Profile and History Detailed Assessment- Review of Records and additional review of physical, cognitive, psychosocial history related to current functional performance    Occupational performance deficits (Please refer to evaluation for details): ADL's;IADL's;Social Participation    Body Structure / Function / Physical Skills ADL;Decreased knowledge of use of DME;Strength;Balance;Dexterity;GMC;Tone;Edema;Body mechanics;UE functional  use;IADL;ROM;Sensation;Mobility;Flexibility;Coordination;Decreased knowledge  of precautions;FMC    Cognitive Skills Energy/Drive;Memory;Problem Solve;Safety Awareness    Rehab Potential Good    Clinical Decision Making Several treatment options, min-mod task modification necessary    Comorbidities Affecting Occupational Performance: Presence of comorbidities impacting occupational performance    Comorbidities impacting occupational performance description: Alzheimers, osteoporosis    Modification or Assistance to Complete Evaluation  Min-Moderate modification of tasks or assist with assess necessary to complete eval    OT Frequency 2x / week    OT Duration 8 weeks   or 16 visits over 3 months (d/t potential scheduling and transportation conflicts), pluse eval   OT Treatment/Interventions Self-care/ADL training;DME and/or AE instruction;Splinting;Compression bandaging;Therapeutic activities;Ultrasound;Therapeutic exercise;Cognitive remediation/compensation;Coping strategies training;Neuromuscular education;Functional Mobility Training;Passive range of motion;Visual/perceptual remediation/compensation;Manual Therapy;Patient/family education    Plan Issue coordination HEP (4 ex's or less), and continue family education on allowing her to participate more for BADLs w/ sup and assist only when needed    Consulted and Agree with Plan of Care Patient;Family member/caregiver    Family Member Consulted Son             Patient will benefit from skilled therapeutic intervention in order to improve the following deficits and impairments:   Body Structure / Function / Physical Skills: ADL, Decreased knowledge of use of DME, Strength, Balance, Dexterity, GMC, Tone, Edema, Body mechanics, UE functional use, IADL, ROM, Sensation, Mobility, Flexibility, Coordination, Decreased knowledge of precautions, FMC Cognitive Skills: Energy/Drive, Memory, Problem Solve, Safety Awareness   Visit Diagnosis: Other lack  of coordination  Attention and concentration deficit  Muscle weakness (generalized)  Abnormal posture  Other symptoms and signs involving the musculoskeletal system  Other symptoms and signs involving the nervous system    Problem List Patient Active Problem List   Diagnosis Date Noted   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   Abnormal liver enzymes 09/19/2019   Blurred vision 09/19/2019   Hypercalcemia 03/15/2019   Major depression in full remission (HCC) 11/24/2018   Memory loss 12/24/2016   Encounter for well adult exam with abnormal findings 06/13/2013   Hyperparathyroidism, primary (HCC) 06/12/2013   GERD (gastroesophageal reflux disease) 02/19/2012   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

## 2021-07-01 ENCOUNTER — Encounter: Payer: Self-pay | Admitting: *Deleted

## 2021-07-01 ENCOUNTER — Ambulatory Visit: Payer: Medicare Other | Attending: Neurology | Admitting: Physical Therapy

## 2021-07-01 ENCOUNTER — Ambulatory Visit: Payer: Medicare Other | Admitting: Occupational Therapy

## 2021-07-01 ENCOUNTER — Other Ambulatory Visit: Payer: Self-pay

## 2021-07-01 DIAGNOSIS — R29898 Other symptoms and signs involving the musculoskeletal system: Secondary | ICD-10-CM | POA: Insufficient documentation

## 2021-07-01 DIAGNOSIS — R278 Other lack of coordination: Secondary | ICD-10-CM

## 2021-07-01 DIAGNOSIS — M6281 Muscle weakness (generalized): Secondary | ICD-10-CM | POA: Diagnosis present

## 2021-07-01 DIAGNOSIS — R293 Abnormal posture: Secondary | ICD-10-CM

## 2021-07-01 DIAGNOSIS — R2689 Other abnormalities of gait and mobility: Secondary | ICD-10-CM | POA: Diagnosis not present

## 2021-07-01 DIAGNOSIS — R41840 Attention and concentration deficit: Secondary | ICD-10-CM | POA: Insufficient documentation

## 2021-07-01 DIAGNOSIS — R2681 Unsteadiness on feet: Secondary | ICD-10-CM | POA: Insufficient documentation

## 2021-07-01 DIAGNOSIS — R600 Localized edema: Secondary | ICD-10-CM | POA: Diagnosis present

## 2021-07-01 DIAGNOSIS — R29818 Other symptoms and signs involving the nervous system: Secondary | ICD-10-CM | POA: Diagnosis present

## 2021-07-01 NOTE — Therapy (Signed)
[facility] [address] [place], [place], [address] Phone: [phone]   Fax:  [phone]  Physical Therapy Treatment  Patient Details  Name: Brandi Walsh MRN: [id number] Date of Birth: 09-11-1946 Referring Provider (PT): Patrcia Dolly   Encounter Date: 07/01/2021   PT End of Session - 07/01/21 0851     Visit Number 8    Number of Visits 16    Date for PT Re-Evaluation 07/18/21    Authorization Type UHC medicare    Progress Note Due on Visit 10    PT Start Time 0848    PT Stop Time 0930    PT Time Calculation (min) 42 min    Equipment Utilized During Treatment Gait belt    Activity Tolerance Patient tolerated treatment well    Behavior During Therapy [facility] for tasks assessed/performed             Past Medical History:  Diagnosis Date   Alzheimer's disease (HCC)    COUGH DUE TO ACE INHIBITORS 11/25/2009   Qualifier: Diagnosis of  By: Everardo All MD, Sean A    Depression    DIABETES MELLITUS, TYPE II 10/13/2007   Qualifier: Diagnosis of  By: Samara Snide     HYPERLIPIDEMIA 10/13/2007   Qualifier: Diagnosis of  By: Samara Snide     HYPERTENSION 10/13/2007   Qualifier: Diagnosis of  By: Samara Snide     MENOPAUSAL SYNDROME 10/13/2007   Qualifier: Diagnosis of  By: Charlsie Quest RMA, Lucy     OSTEOPOROSIS 10/13/2007   Qualifier: Diagnosis of  By: Samara Snide      Past Surgical History:  Procedure Laterality Date   ABDOMINAL HYSTERECTOMY     BREAST BIOPSY  08/2002   TONSILLECTOMY      There were no vitals filed for this visit.   Subjective Assessment - 07/01/21 0851     Subjective Pt reports no falls, no changes.    Patient is accompained by: Family member   spouse in lobby   Pertinent History MH:  Alzheimer's, HTN, HLD, depression, anxiety, osteoporosis    Patient Stated Goals Pt's goals are to walk better.    Currently in Pain? No/denies                                [facility] Adult PT Treatment/Exercise - 07/01/21 0001       Transfers   Transfers Sit to Stand;Stand to Sit    Sit to Stand 5: Supervision;With upper extremity assist;From bed;From chair/3-in-1    Stand to Sit 5: Supervision;With upper extremity assist;To bed;To chair/3-in-1      Ambulation/Gait   Ambulation/Gait Yes    Ambulation/Gait Assistance 4: Min guard    Ambulation/Gait Assistance Details No device in and out of therapy session.  PT provides occasional assist with steering rollator and keeping close.    Ambulation Distance (Feet) 230 Feet   then 200; 20 ft x 2   Assistive device Rollator    Gait Pattern Step-through pattern;Decreased arm swing - right;Decreased arm swing - left;Step-to pattern;Decreased step length - right;Decreased step length - left;Decreased dorsiflexion - right;Decreased dorsiflexion - left;Right flexed knee in stance;Left flexed knee in stance;Trunk flexed;Wide base of support;Poor foot clearance - left;Poor foot clearance - right    Ambulation Surface Level;Indoor    Gait Comments Cues to stay close to rollator, for increased step length and to widen BOS at times (  during curves/turns)      Knee/Hip Exercises: Aerobic   Nustep NuStep, Level 2, 4 extremities, x 6 minutes, cues for keeping steps/minute > 50.  For flexibility and strengthening.      Knee/Hip Exercises: Seated   Long Arc Quad Strengthening;Right;Left;2 sets;10 reps;Weights    Long Arc Quad Weight 1 lbs.    Other Seated Knee/Hip Exercises Seated toe raises, ankle dorsiflexion 2 sets x 10 reps    Marching Strengthening;Right;Left;10 reps;2 sets;Weights    Marching Weights 1 lbs.                 Balance Exercises - 07/01/21 0001       Balance Exercises: Standing   SLS with Vectors Solid surface;Upper extremity assist 1;Limitations;Other reps (comment)    SLS with Vectors Limitations Alt step taps at 4" step, 1 UE support, 2 sets x 10    Stepping Strategy Anterior;Lateral;UE support;10 reps     Marching Solid surface;Upper extremity assist 2;Static;10 reps                 PT Short Term Goals - 06/19/21 0816       PT SHORT TERM GOAL #1   Title Pt will perform HEP with family supervision for improved strength, balance, transfers, and gait.  TARGET 06/20/2021    Time 4    Period Weeks    Status Achieved      PT SHORT TERM GOAL #2   Title Pt will improve 5x sit<>stand to less than or equal to 20 sec to demonstrate improved functional strength and transfer efficiency.    Baseline 25.03 sec; 17.84 sec 06/19/2021    Time 4    Period Weeks    Status Achieved      PT SHORT TERM GOAL #3   Title Pt will verbalize understanding of fall prevention in home environment.    Time 4    Period Weeks    Status Achieved      PT SHORT TERM GOAL #4   Title Pt will improve TUG score to less than or equal to 40 sec for decreased fall risk.    Baseline 51.22 sec; 33.28 sec 06/19/21    Time 8    Period Weeks    Status Achieved               PT Long Term Goals - 05/27/21 0906       PT LONG TERM GOAL #1   Title Pt will perform progression of HEP with family supervision for improved strength, balance, transfers, and gait.  TARGET 07/18/2021    Time 8    Period Weeks    Status New      PT LONG TERM GOAL #2   Title Pt will improve 5x sit<>stand to less than or equal to 15 sec to demonstrate improved functional strength and transfer efficiency.    Time 8    Period Weeks    Status New      PT LONG TERM GOAL #3   Title Pt will improve gait velocity to at least 1.3 ft/sefc for improved gait efficiency and safety.    Baseline 0.82 ft/sec    Time 8    Period Weeks    Status New      PT LONG TERM GOAL #4   Title Pt will improve TUG score to less than or equal to 30 sec for decreased fall risk.    Baseline 51.22 sec    Time 8  Period Weeks    Status New      PT LONG TERM GOAL #5   Title Pt will ambulate at least 500 ft, supervision, outdoor surfaces, with appropriat  assistive device as needed, for improved community gait.    Time 8    Period Weeks    Status New                   Plan - 07/01/21 3536     Clinical Impression Statement SKilled PT session today focused on gait endurance, using rollator walker, lower extremity strengthening, and standing balance.  Pt continues to have very narrow BOS and decreased foot clearance with gait; she improves with cues, but needs them frequently.  Able to increase time on aerobic machine for lower extremity strength and flexibility.  She continues to benefit from skilled PT to address and focus on strength, balance, gait for improved overall functional mobility and decreased fall risk.    Personal Factors and Comorbidities Comorbidity 3+    Comorbidities PMH:  Alzheimer's, HTN, HLD, depression, anxiety, osteoporosis    Examination-Activity Limitations Locomotion Level;Transfers;Stairs;Stand    Examination-Participation Restrictions Community Activity;Other   Walking in the neighborhood   Stability/Clinical Decision Making Evolving/Moderate complexity    Rehab Potential Good    PT Frequency 2x / week    PT Duration 8 weeks   including eval week   PT Treatment/Interventions ADLs/Self Care Home Management;Gait training;Stair training;Functional mobility training;Therapeutic activities;Therapeutic exercise;Balance training;Neuromuscular re-education;Patient/family education;Passive range of motion;Manual techniques    PT Next Visit Plan Consider walking program for home-bring husband back to discuss.  Work on gait for increased step length and arm swing (use rollator in therapy session to help with posture, step length); use of seated stepper/NuStep for flexibility and strength.  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 07/01/2021, 9:31 AM Gean Maidens., PT   [facility] [address] [place], [place], [address] Phone: [phone]   Fax:  [phone]  Name: EARL ZELLMER MRN: [id number] Date of Birth: 10/02/1946

## 2021-07-01 NOTE — Therapy (Signed)
[facility] [address] [place], [place], [address] Phone: [phone]   Fax:  [phone]  Occupational Therapy Treatment  Patient Details  Name: Brandi Walsh MRN: [id number] Date of Birth: 10-Nov-1946 No data recorded  Encounter Date: 07/01/2021   OT End of Session - 07/01/21 1000     Visit Number 6    Number of Visits 17    Date for OT Re-Evaluation 09/10/21    Authorization Type UHC MCR    Progress Note Due on Visit 10    OT Start Time 0757    OT Stop Time 0843    OT Time Calculation (min) 46 min    Activity Tolerance Patient tolerated treatment well    Behavior During Therapy [facility] for tasks assessed/performed;Flat affect             Past Medical History:  Diagnosis Date   Alzheimer's disease (HCC)    COUGH DUE TO ACE INHIBITORS 11/25/2009   Qualifier: Diagnosis of  By: Everardo All MD, Sean A    Depression    DIABETES MELLITUS, TYPE II 10/13/2007   Qualifier: Diagnosis of  By: Samara Snide     HYPERLIPIDEMIA 10/13/2007   Qualifier: Diagnosis of  By: Samara Snide     HYPERTENSION 10/13/2007   Qualifier: Diagnosis of  By: Samara Snide     MENOPAUSAL SYNDROME 10/13/2007   Qualifier: Diagnosis of  By: Charlsie Quest RMA, Lucy     OSTEOPOROSIS 10/13/2007   Qualifier: Diagnosis of  By: Samara Snide      Past Surgical History:  Procedure Laterality Date   ABDOMINAL HYSTERECTOMY     BREAST BIOPSY  08/2002   TONSILLECTOMY      There were no vitals filed for this visit.   Subjective Assessment - 07/01/21 0804     Subjective  Pt denies pain and states she has been doing exercises at home at least once a day.    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 Medical history includes: Alzheimers, HTN, HLD, depression, anxiety, osteoporosis    Patient Stated Goals improvement in Lt hand    Currently in Pain? No/denies    Pain Score 0-No  pain               OT Treatments/Exercises (OP) - 07/01/21 0001       ADLs   UB Dressing UB dressing techniques, fine motor coordination bilateral hands for buttoning buttons on shirt w/ Mod difficulty w/ increased time. Pt benefits/requires assistance to complete.    ADL Comments Simulated ADL's while performing bag exercies. Overhead and under feet/legs to simulate donning shirt and socks. x10 reps each given increased time and supervision-min assist. Pt benefits from repetition, demonstration as well as verbal/tactile cues for task initiation and follow through.      Functional Reaching Activities   Mid Level Large amplitude movements sitting at table top. Alternating crosing midline and placing large cards face up on table with left and right UE's. Pt required both verbal, tactile cues and demonstration for consistency and performance/follow through of task.    High Level Large amplitude arm movement using scarves while seated. Alternated crossing midline w/ both R and L UE's and placing on ipsilateral side x15 reps each (low to mid, mid to low reaching) while seated at table.               OT Education -  07/01/21 0959     Education Details Reviewed bag exercises for home program.    Person(s) Educated Patient    Methods Demonstration;Verbal cues;Tactile cues    Comprehension Verbalized understanding;Verbal cues required;Tactile cues required;Need further instruction;Returned demonstration              OT Short Term Goals - 06/17/21 0937       OT SHORT TERM GOAL #1   Title Independent with HEP for BUE ROM/endurance and hand coordination    Time 4    Period Weeks    Status On-going      OT SHORT TERM GOAL #2   Title Pt/family to verbalize understanding with edema management strategies for Lt hand including compression glove    Time 4    Period Weeks    Status On-going      OT SHORT TERM GOAL #3   Title Pt to perform UB dressing/bathing with min assist     Time 4    Period Weeks    Status New      OT SHORT TERM GOAL #4   Title Pt to perform LB dressing/bathing with mod assist    Time 4    Period Weeks    Status New      OT SHORT TERM GOAL #5   Title Pt/family to verbalize understanding with memory strategies and cognitive tips to increase participation in ADLS including some involvement in medication management    Time 4    Period Weeks    Status New               OT Long Term Goals - 06/10/21 1020       OT LONG TERM GOAL #1   Title Independent with updated HEP prn    Time 8    Period Weeks    Status New      OT LONG TERM GOAL #2   Title Pt to improve BUE function as evidenced by improving Box & Blocks score to 20 or greater    Baseline Rt/Lt = 17    Time 8    Period Weeks    Status New      OT LONG TERM GOAL #3   Title Pt to demo sufficient bilateral shoulder ROM to get shirt overhead and wash hair    Time 8    Period Weeks    Status New      OT LONG TERM GOAL #4   Title Pt to improve bilateral hand coordination as evidenced by reducing speed on 9 hole pegt est to 60 sec or under    Baseline Rt = 69.18 sec, Lt = 64.38 sec    Time 8    Period Weeks    Status New      OT LONG TERM GOAL #5   Title Pt to be overall min assist with BADLS    Time 8    Period Weeks    Status New      Long Term Additional Goals   Additional Long Term Goals Yes      OT LONG TERM GOAL #6   Title Pt to perform simple dyanmic standing task (brushing teeth, folding laundry) for 10 min. w/o LOB using one hand countertop support prn    Time 8    Period Weeks    Status New                   Plan - 07/01/21 1001  Clinical Impression Statement Pt demonstrated good performance of large amplitude arm movements given min verbal/tactile cues as well as demonstration for initiation, follow through and completetion of ex's as a precursor for increased participation with ADL's at home.    OT Occupational Profile and History  Detailed Assessment- Review of Records and additional review of physical, cognitive, psychosocial history related to current functional performance    Occupational performance deficits (Please refer to evaluation for details): ADL's;IADL's;Social Participation    Body Structure / Function / Physical Skills ADL;Decreased knowledge of use of DME;Strength;Balance;Dexterity;GMC;Tone;Edema;Body mechanics;UE functional use;IADL;ROM;Sensation;Mobility;Flexibility;Coordination;Decreased knowledge of precautions;FMC    Cognitive Skills Energy/Drive;Memory;Problem Solve;Safety Awareness    Rehab Potential Good    Clinical Decision Making Several treatment options, min-mod task modification necessary    Comorbidities Affecting Occupational Performance: Presence of comorbidities impacting occupational performance    Comorbidities impacting occupational performance description: Alzheimers, osteoporosis    Modification or Assistance to Complete Evaluation  Min-Moderate modification of tasks or assist with assess necessary to complete eval    OT Frequency 2x / week    OT Duration 8 weeks   or 16 visits over 3 months (d/t scheduling & transporation conflicts) plus eval.   OT Treatment/Interventions Self-care/ADL training;DME and/or AE instruction;Splinting;Compression bandaging;Therapeutic activities;Ultrasound;Therapeutic exercise;Cognitive remediation/compensation;Coping strategies training;Neuromuscular education;Functional Mobility Training;Passive range of motion;Visual/perceptual remediation/compensation;Manual Therapy;Patient/family education    Plan Consider issuing HEP for coordination (4 ex's or less), family education re: allowing pt to participate more with BADL's w/ PRN assist only when needed.    Consulted and Agree with Plan of Care Patient             Patient will benefit from skilled therapeutic intervention in order to improve the following deficits and impairments:   Body Structure /  Function / Physical Skills: ADL, Decreased knowledge of use of DME, Strength, Balance, Dexterity, GMC, Tone, Edema, Body mechanics, UE functional use, IADL, ROM, Sensation, Mobility, Flexibility, Coordination, Decreased knowledge of precautions, FMC Cognitive Skills: Energy/Drive, Memory, Problem Solve, Safety Awareness     Visit Diagnosis: Other abnormalities of gait and mobility  Muscle weakness (generalized)  Abnormal posture  Other symptoms and signs involving the musculoskeletal system  Other lack of coordination  Other symptoms and signs involving the nervous system    Problem List Patient Active Problem List   Diagnosis Date Noted   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   Abnormal liver enzymes 09/19/2019   Blurred vision 09/19/2019   Hypercalcemia 03/15/2019   Major depression in full remission (HCC) 11/24/2018   Memory loss 12/24/2016   Encounter for well adult exam with abnormal findings 06/13/2013   Hyperparathyroidism, primary (HCC) 06/12/2013   GERD (gastroesophageal reflux disease) 02/19/2012   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   COUGH DUE TO ACE INHIBITORS 11/25/2009   Diabetes (HCC) 10/13/2007   Dyslipidemia 10/13/2007   Essential hypertension 10/13/2007   MENOPAUSAL SYNDROME 10/13/2007   Osteoporosis 10/13/2007    Alm Bustard, OT 07/01/2021, 10:10 AM  [facility] [address] [place], [place], [address] Phone: [phone]   Fax:  [phone]  Name: Brandi Walsh MRN: [id number] Date of Birth: 1945-12-04

## 2021-07-03 ENCOUNTER — Ambulatory Visit: Payer: Medicare Other | Admitting: Occupational Therapy

## 2021-07-03 ENCOUNTER — Ambulatory Visit: Payer: Medicare Other | Admitting: Physical Therapy

## 2021-07-03 ENCOUNTER — Encounter: Payer: Self-pay | Admitting: Physical Therapy

## 2021-07-03 ENCOUNTER — Other Ambulatory Visit: Payer: Self-pay

## 2021-07-03 DIAGNOSIS — R41840 Attention and concentration deficit: Secondary | ICD-10-CM

## 2021-07-03 DIAGNOSIS — R2689 Other abnormalities of gait and mobility: Secondary | ICD-10-CM | POA: Diagnosis not present

## 2021-07-03 DIAGNOSIS — R278 Other lack of coordination: Secondary | ICD-10-CM

## 2021-07-03 DIAGNOSIS — M6281 Muscle weakness (generalized): Secondary | ICD-10-CM

## 2021-07-03 DIAGNOSIS — R29898 Other symptoms and signs involving the musculoskeletal system: Secondary | ICD-10-CM

## 2021-07-03 DIAGNOSIS — R2681 Unsteadiness on feet: Secondary | ICD-10-CM

## 2021-07-03 DIAGNOSIS — R29818 Other symptoms and signs involving the nervous system: Secondary | ICD-10-CM

## 2021-07-03 DIAGNOSIS — R293 Abnormal posture: Secondary | ICD-10-CM

## 2021-07-03 NOTE — Therapy (Signed)
[facility] [address] [place], [place], [address] Phone: [phone]   Fax:  [phone]  Physical Therapy Treatment/ (9th Visit) Progress Note  Patient Details  Name: Brandi Walsh MRN: [id number] Date of Birth: 06-22-1946 Referring Provider (PT): Ellouise Newer   See clinical impression statement for Progress Note details  Encounter Date: 07/03/2021   PT End of Session - 07/03/21 0805     Visit Number 9    Number of Visits 16    Date for PT Re-Evaluation 07/18/21    Authorization Type [employer]HC medicare    Progress Note Due on Visit --   PT Start Time 0805    PT Stop Time 0843    PT Time Calculation (min) 38 min    Equipment Utilized During Treatment Gait belt    Activity Tolerance Patient tolerated treatment well    Behavior During Therapy [facility] for tasks assessed/performed             Past Medical History:  Diagnosis Date   Alzheimer's disease ([place])    COUGH DUE TO ACE INHIBITORS 11/25/2009   Qualifier: Diagnosis of  By: [name] MD, Sean A    Depression    DIABETES MELLITUS, TYPE II 10/13/2007   Qualifier: Diagnosis of  By: Larose Kells     HYPERLIPIDEMIA 10/13/2007   Qualifier: Diagnosis of  By: Larose Kells     HYPERTENSION 10/13/2007   Qualifier: Diagnosis of  By: Larose Kells     MENOPAUSAL SYNDROME 10/13/2007   Qualifier: Diagnosis of  By: Marca Ancona RMA, Lucy     OSTEOPOROSIS 10/13/2007   Qualifier: Diagnosis of  By: Larose Kells      Past Surgical History:  Procedure Laterality Date   ABDOMINAL HYSTERECTOMY     BREAST BIOPSY  08/2002   TONSILLECTOMY      There were no vitals filed for this visit.   Subjective Assessment - 07/03/21 0805     Subjective Not really anything new.    Patient is accompained by: Family member   spouse in lobby   Pertinent History MH:  Alzheimer's, HTN, HLD, depression, anxiety, osteoporosis    Patient Stated Goals Pt's goals are to walk better.     Currently in Pain? No/denies                               [facility] Adult PT Treatment/Exercise - 07/03/21 0001       Transfers   Transfers Sit to Stand;Stand to Sit    Sit to Stand 5: Supervision;With upper extremity assist;From bed;From chair/3-in-1    Sit to Stand Details Verbal cues for sequencing;Verbal cues for technique;Tactile cues for placement   cues for hand placement   Stand to Sit 5: Supervision;With upper extremity assist;To bed;To chair/3-in-1    Number of Reps 2 sets;Other reps (comment)   5 reps     Ambulation/Gait   Ambulation/Gait Yes    Ambulation/Gait Assistance 4: Min guard;4: Min assist    Ambulation/Gait Assistance Details No device in and out of therapy.  PT provides HHA and cues for widened BOS, as pt has very narrow BOS and does not widen with cues.    Ambulation Distance (Feet) 80 Feet   x 2, 230 ft with rollator; 230 ft HHA on R side   Assistive device Rollator;None    Gait Pattern Step-through pattern;Decreased arm swing - right;Decreased arm swing - left;Step-to  pattern;Decreased step length - right;Decreased step length - left;Decreased dorsiflexion - right;Decreased dorsiflexion - left;Right flexed knee in stance;Left flexed knee in stance;Trunk flexed;Poor foot clearance - left;Poor foot clearance - right;Narrow base of support    Ambulation Surface Level;Indoor    Gait Comments With rollator and standing break mid-way during gait, cues for wide BOS and lateral weightshift x 5 reps, then cues to keep base of support wider; pt with better ability to stay wide after static wegithshifting.  Also with gait with HHA-initial standing, and additional standing break midway through gait with wide BOS lateral weightshifting.      High Level Balance   High Level Balance Activities Other (comment)    High Level Balance Comments Wide BOS lateral weightshifting standing at chair, 10 reps; sidestep and weightshift x 10 reps, tactile cues for wider BOS  upon return to midline.  Seated single leg step out and in, 10 reps each leg.            Seated step out and in, performed x 2 sets of 10          PT Short Term Goals - 06/19/21 0816       PT SHORT TERM GOAL #1   Title Pt will perform HEP with family supervision for improved strength, balance, transfers, and gait.  TARGET 06/20/2021    Time 4    Period Weeks    Status Achieved      PT SHORT TERM GOAL #2   Title Pt will improve 5x sit<>stand to less than or equal to 20 sec to demonstrate improved functional strength and transfer efficiency.    Baseline 25.03 sec; 17.84 sec 06/19/2021    Time 4    Period Weeks    Status Achieved      PT SHORT TERM GOAL #3   Title Pt will verbalize understanding of fall prevention in home environment.    Time 4    Period Weeks    Status Achieved      PT SHORT TERM GOAL #4   Title Pt will improve TUG score to less than or equal to 40 sec for decreased fall risk.    Baseline 51.22 sec; 33.28 sec 06/19/21    Time 8    Period Weeks    Status Achieved               PT Long Term Goals - 05/27/21 0906       PT LONG TERM GOAL #1   Title Pt will perform progression of HEP with family supervision for improved strength, balance, transfers, and gait.  TARGET 07/18/2021    Time 8    Period Weeks    Status New      PT LONG TERM GOAL #2   Title Pt will improve 5x sit<>stand to less than or equal to 15 sec to demonstrate improved functional strength and transfer efficiency.    Time 8    Period Weeks    Status New      PT LONG TERM GOAL #3   Title Pt will improve gait velocity to at least 1.3 ft/sefc for improved gait efficiency and safety.    Baseline 0.82 ft/sec    Time 8    Period Weeks    Status New      PT LONG TERM GOAL #4   Title Pt will improve TUG score to less than or equal to 30 sec for decreased fall risk.    Baseline 51.22 sec  Time 8    Period Weeks    Status New      PT LONG TERM GOAL #5   Title Pt will  ambulate at least 500 ft, supervision, outdoor surfaces, with appropriat assistive device as needed, for improved community gait.    Time 8    Period Weeks    Status New                   Plan - 07/03/21 0841     Clinical Impression Statement 9th Visit Progress Note, covering dates 05/27/2021-07/03/21.  Subjective reports:  Pt reports no falls.  Husband has reported pt doing better at home since beginning of therapy.  Obejctive measures:  recently taken 5x sit<>stand 17.84 sec and TUG 33.28 sec (both measures improved since eval).  Pt ambulates 230 ft with rollator and min guard; she ambulates 230 ft with HHA and min guard assist; cues for widened BOS and for step length.  She has met all STGs and is progressing towards LTGs.  Pt continues to be at a fall risk and demo decreased functional strength and need for assistance with gait.  She will continue to benefit from skilled PT towards LTGs to address balance, strength, gait for improved mobility and decreased fall risk.    Personal Factors and Comorbidities Comorbidity 3+    Comorbidities PMH:  Alzheimer's, HTN, HLD, depression, anxiety, osteoporosis    Examination-Activity Limitations Locomotion Level;Transfers;Stairs;Stand    Examination-Participation Restrictions Community Activity;Other   Walking in the neighborhood   Stability/Clinical Decision Making Evolving/Moderate complexity    Rehab Potential Good    PT Frequency 2x / week    PT Duration 8 weeks   including eval week   PT Treatment/Interventions ADLs/Self Care Home Management;Gait training;Stair training;Functional mobility training;Therapeutic activities;Therapeutic exercise;Balance training;Neuromuscular re-education;Patient/family education;Passive range of motion;Manual techniques    PT Next Visit Plan Consider walking program for home-bring husband back to discuss (husband was not in waiting room during 07/03/21 session to ask to come back).  Would need to discuss if they  would want to consider rollator for walking or continue HHA with gait.   Work on gait for increased step length and arm swing (use rollator in therapy session to help with posture, step length); use of seated stepper/NuStep for flexibility and strength.  Standing balance, working on wider BOS.    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 07/03/2021, 8:57 AM Frazier Butt., PT   [facility] [address] [place], [place], [address] Phone: [phone]   Fax:  [phone]  Name: Brandi Walsh MRN: [id number] Date of Birth: [birth year]

## 2021-07-03 NOTE — Patient Instructions (Addendum)
Coordination Exercises  Perform the following exercises for 20 minutes 1 times per day. Perform with both hand(s). Perform using big movements.  Flipping Cards: Place deck of cards on the table. Flip cards over by opening your hand big to grasp and then turn your palm up big. Deal cards: Hold 1/2 or whole deck in your hand. Use thumb to push card off top of deck with one big push.

## 2021-07-03 NOTE — Therapy (Signed)
[facility] [address] [name], [place], [address] Phone: [phone]   Fax:  [phone]  Occupational Therapy Treatment  Patient Details  Name: Brandi Walsh MRN: [id number] Date of Birth: [birth year] No data recorded  Encounter Date: 07/03/2021   OT End of Session - 07/03/21 0902     Visit Number 7    Number of Visits 17    Date for OT Re-Evaluation 09/10/21    Authorization Type UHC MCR    Progress Note Due on Visit 10    OT Start Time 0848    OT Stop Time 0930    OT Time Calculation (min) 42 min             Past Medical History:  Diagnosis Date   Alzheimer's disease ([facility])    COUGH DUE TO ACE INHIBITORS 11/25/2009   Qualifier: Diagnosis of  By: Everardo All MD, Sean A    Depression    DIABETES MELLITUS, TYPE II 10/13/2007   Qualifier: Diagnosis of  By: Samara Snide     HYPERLIPIDEMIA 10/13/2007   Qualifier: Diagnosis of  By: Samara Snide     HYPERTENSION 10/13/2007   Qualifier: Diagnosis of  By: Samara Snide     MENOPAUSAL SYNDROME 10/13/2007   Qualifier: Diagnosis of  By: Charlsie Quest RMA, Lucy     OSTEOPOROSIS 10/13/2007   Qualifier: Diagnosis of  By: Samara Snide      Past Surgical History:  Procedure Laterality Date   ABDOMINAL HYSTERECTOMY     BREAST BIOPSY  08/2002   TONSILLECTOMY      There were no vitals filed for this visit.   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 Medical history includes: Alzheimers, HTN, HLD, depression, anxiety, osteoporosis    Patient Stated Goals improvement in Lt hand    Currently in Pain? No/denies               Treatment:PWR! Up and PWR! Rock in seated as warm up, mod v.c. and demonstration Functional reaching with trunk rotation with left and right UE's to place large pegs in pegboard, min v.c for amplitude                     OT Short  Term Goals - 07/03/21 0903       OT SHORT TERM GOAL #1   Title Independent with HEP for BUE ROM/endurance and hand coordination    Time 4    Period Weeks    Status On-going      OT SHORT TERM GOAL #2   Title Pt/family to verbalize understanding with edema management strategies for Lt hand including compression glove    Time 4    Period Weeks    Status On-going      OT SHORT TERM GOAL #3   Title Pt to perform UB dressing/bathing with min assist    Time 4    Period Weeks    Status New      OT SHORT TERM GOAL #4   Title Pt to perform LB dressing/bathing with mod assist    Time 4    Period Weeks    Status New      OT SHORT TERM GOAL #5   Title Pt/family to verbalize understanding with memory strategies and cognitive tips to increase participation in ADLS including some involvement in medication management  Time 4    Period Weeks    Status New               OT Long Term Goals - 06/10/21 1020       OT LONG TERM GOAL #1   Title Independent with updated HEP prn    Time 8    Period Weeks    Status New      OT LONG TERM GOAL #2   Title Pt to improve BUE function as evidenced by improving Box & Blocks score to 20 or greater    Baseline Rt/Lt = 17    Time 8    Period Weeks    Status New      OT LONG TERM GOAL #3   Title Pt to demo sufficient bilateral shoulder ROM to get shirt overhead and wash hair    Time 8    Period Weeks    Status New      OT LONG TERM GOAL #4   Title Pt to improve bilateral hand coordination as evidenced by reducing speed on 9 hole pegt est to 60 sec or under    Baseline Rt = 69.18 sec, Lt = 64.38 sec    Time 8    Period Weeks    Status New      OT LONG TERM GOAL #5   Title Pt to be overall min assist with BADLS    Time 8    Period Weeks    Status New      Long Term Additional Goals   Additional Long Term Goals Yes      OT LONG TERM GOAL #6   Title Pt to perform simple dyanmic standing task (brushing teeth, folding laundry)  for 10 min. w/o LOB using one hand countertop support prn    Time 8    Period Weeks    Status New                   Plan - 07/03/21 1856     Clinical Impression Statement Pt is progressing towards goals with improving UE functional use.    OT Occupational Profile and History Detailed Assessment- Review of Records and additional review of physical, cognitive, psychosocial history related to current functional performance    Occupational performance deficits (Please refer to evaluation for details): ADL's;IADL's;Social Participation    Body Structure / Function / Physical Skills ADL;Decreased knowledge of use of DME;Strength;Balance;Dexterity;GMC;Tone;Edema;Body mechanics;UE functional use;IADL;ROM;Sensation;Mobility;Flexibility;Coordination;Decreased knowledge of precautions;FMC    Cognitive Skills Energy/Drive;Memory;Problem Solve;Safety Awareness    Rehab Potential Good    Clinical Decision Making Several treatment options, min-mod task modification necessary    Comorbidities Affecting Occupational Performance: Presence of comorbidities impacting occupational performance    Comorbidities impacting occupational performance description: Alzheimers, osteoporosis    Modification or Assistance to Complete Evaluation  Min-Moderate modification of tasks or assist with assess necessary to complete eval    OT Frequency 2x / week    OT Duration 8 weeks   or 16 visits over 3 months (d/t scheduling & transporation conflicts) plus eval.   OT Treatment/Interventions Self-care/ADL training;DME and/or AE instruction;Splinting;Compression bandaging;Therapeutic activities;Ultrasound;Therapeutic exercise;Cognitive remediation/compensation;Coping strategies training;Neuromuscular education;Functional Mobility Training;Passive range of motion;Visual/perceptual remediation/compensation;Manual Therapy;Patient/family education    Plan family education re: allowing pt to participate more with BADL's w/ PRN  assist only when needed.    Consulted and Agree with Plan of Care Patient             Patient will benefit from  skilled therapeutic intervention in order to improve the following deficits and impairments:   Body Structure / Function / Physical Skills: ADL, Decreased knowledge of use of DME, Strength, Balance, Dexterity, GMC, Tone, Edema, Body mechanics, UE functional use, IADL, ROM, Sensation, Mobility, Flexibility, Coordination, Decreased knowledge of precautions, FMC Cognitive Skills: Energy/Drive, Memory, Problem Solve, Safety Awareness     Visit Diagnosis: Other abnormalities of gait and mobility  Unsteadiness on feet  Muscle weakness (generalized)  Abnormal posture  Other symptoms and signs involving the musculoskeletal system  Other lack of coordination  Other symptoms and signs involving the nervous system  Attention and concentration deficit    Problem List Patient Active Problem List   Diagnosis Date Noted   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   Abnormal liver enzymes 09/19/2019   Blurred vision 09/19/2019   Hypercalcemia 03/15/2019   Major depression in full remission ([facility]) 11/24/2018   Memory loss 12/24/2016   Encounter for well adult exam with abnormal findings 06/13/2013   Hyperparathyroidism, primary ([facility]) 06/12/2013   GERD (gastroesophageal reflux disease) 02/19/2012   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   COUGH DUE TO ACE INHIBITORS 11/25/2009   Diabetes ([facility]) 10/13/2007   Dyslipidemia 10/13/2007   Essential hypertension 10/13/2007   MENOPAUSAL SYNDROME 10/13/2007   Osteoporosis 10/13/2007    Brandi Walsh 07/03/2021, 9:06 AM  [facility] [address] [place], [place], [address] Phone: [phone]   Fax:  [phone]  Name: Brandi Walsh MRN: [id number] Date of Birth: 1946/03/24

## 2021-07-08 ENCOUNTER — Ambulatory Visit: Payer: Medicare Other | Admitting: Occupational Therapy

## 2021-07-08 ENCOUNTER — Encounter: Payer: Self-pay | Admitting: Physician Assistant

## 2021-07-08 ENCOUNTER — Encounter: Payer: Self-pay | Admitting: Physical Therapy

## 2021-07-08 ENCOUNTER — Other Ambulatory Visit: Payer: Self-pay

## 2021-07-08 ENCOUNTER — Encounter: Payer: Self-pay | Admitting: Occupational Therapy

## 2021-07-08 ENCOUNTER — Ambulatory Visit (INDEPENDENT_AMBULATORY_CARE_PROVIDER_SITE_OTHER): Payer: Medicare Other | Admitting: Physician Assistant

## 2021-07-08 ENCOUNTER — Ambulatory Visit: Payer: Medicare Other | Admitting: Physical Therapy

## 2021-07-08 ENCOUNTER — Ambulatory Visit (HOSPITAL_COMMUNITY)
Admission: RE | Admit: 2021-07-08 | Discharge: 2021-07-08 | Disposition: A | Discharge status: Home / Self Care | Payer: Medicare Other | Source: Ambulatory Visit | Attending: Vascular Surgery | Admitting: Vascular Surgery

## 2021-07-08 VITALS — BP 114/70 | HR 75 | Temp 97.7°F | Ht 60.0 in | Wt 133.8 lb

## 2021-07-08 DIAGNOSIS — I872 Venous insufficiency (chronic) (peripheral): Secondary | ICD-10-CM

## 2021-07-08 DIAGNOSIS — R2689 Other abnormalities of gait and mobility: Secondary | ICD-10-CM

## 2021-07-08 DIAGNOSIS — R29898 Other symptoms and signs involving the musculoskeletal system: Secondary | ICD-10-CM

## 2021-07-08 DIAGNOSIS — R278 Other lack of coordination: Secondary | ICD-10-CM

## 2021-07-08 DIAGNOSIS — R41840 Attention and concentration deficit: Secondary | ICD-10-CM

## 2021-07-08 DIAGNOSIS — R29818 Other symptoms and signs involving the nervous system: Secondary | ICD-10-CM

## 2021-07-08 DIAGNOSIS — R609 Edema, unspecified: Secondary | ICD-10-CM

## 2021-07-08 DIAGNOSIS — R600 Localized edema: Secondary | ICD-10-CM

## 2021-07-08 DIAGNOSIS — R293 Abnormal posture: Secondary | ICD-10-CM

## 2021-07-08 DIAGNOSIS — R2681 Unsteadiness on feet: Secondary | ICD-10-CM

## 2021-07-08 DIAGNOSIS — M6281 Muscle weakness (generalized): Secondary | ICD-10-CM

## 2021-07-08 NOTE — Therapy (Signed)
[facility] [address] [place], [place], [address] Phone: [phone]   Fax:  [phone]  Physical Therapy Treatment  Patient Details  Name: Brandi Walsh MRN: [id number] Date of Birth: Mar 09, 1946 Referring Provider (PT): Patrcia Dolly   Encounter Date: 07/08/2021   PT End of Session - 07/08/21 0848     Visit Number 10    Number of Visits 16    Date for PT Re-Evaluation 07/18/21    Authorization Type [employer] medicare    Progress Note Due on Visit 19   was completed at visit 9   PT Start Time 0846    PT Stop Time 0930    PT Time Calculation (min) 44 min    Equipment Utilized During Treatment Gait belt    Activity Tolerance Patient tolerated treatment well    Behavior During Therapy [facility] for tasks assessed/performed             Past Medical History:  Diagnosis Date   Alzheimer's disease (HCC)    COUGH DUE TO ACE INHIBITORS 11/25/2009   Qualifier: Diagnosis of  By: Everardo All MD, Sean A    Depression    DIABETES MELLITUS, TYPE II 10/13/2007   Qualifier: Diagnosis of  By: Samara Snide     HYPERLIPIDEMIA 10/13/2007   Qualifier: Diagnosis of  By: Samara Snide     HYPERTENSION 10/13/2007   Qualifier: Diagnosis of  By: Samara Snide     MENOPAUSAL SYNDROME 10/13/2007   Qualifier: Diagnosis of  By: Charlsie Quest RMA, Lucy     OSTEOPOROSIS 10/13/2007   Qualifier: Diagnosis of  By: Samara Snide      Past Surgical History:  Procedure Laterality Date   ABDOMINAL HYSTERECTOMY     BREAST BIOPSY  08/2002   TONSILLECTOMY      There were no vitals filed for this visit.   Subjective Assessment - 07/08/21 0847     Subjective No new complaints. No falls. Pain at times in some areas of her body with certain movements, none at this time. HEP is going well at home.    Patient is accompained by: Family member   spouse in lobby   Pertinent History MH:  Alzheimer's, HTN, HLD, depression, anxiety, osteoporosis     Patient Stated Goals Pt's goals are to walk better.    Currently in Pain? No/denies    Pain Score 0-No pain                   [facility] Adult PT Treatment/Exercise - 07/08/21 0850       Transfers   Transfers Sit to Stand;Stand to Sit    Sit to Stand 5: Supervision;With upper extremity assist;From bed;From chair/3-in-1    Stand to Sit 5: Supervision;With upper extremity assist;To bed;To chair/3-in-1    Comments cues to scoot closer to edge of surface prior to standing. increased time needed with both standing up and sitting back down.      Ambulation/Gait   Ambulation/Gait Assistance 4: Min guard;4: Min assist    Ambulation/Gait Assistance Details no device with HHA to enter session. use of rollator in session and to exit session. improved step length and gait speed noted with use of rollator with cues for posture, wider base of support and increased step length.    Ambulation Distance (Feet) 40 Feet   x1, 80 x1, 115 x1,   Assistive device Rollator;None    Gait Pattern Step-through pattern;Decreased arm swing - right;Decreased arm swing -  left;Step-to pattern;Decreased step length - right;Decreased step length - left;Decreased dorsiflexion - right;Decreased dorsiflexion - left;Right flexed knee in stance;Left flexed knee in stance;Trunk flexed;Poor foot clearance - left;Poor foot clearance - right;Narrow base of support    Ambulation Surface Level;Indoor      Neuro Re-ed    Neuro Re-ed Details  for balance/NMR: seated with feet on aerobic step with 2 bolsters on outside of both feet- laterally stepping over the bolster/back to between them (PWR! step) alternating sides with bolster flat, then tall for 10 reps each. cues for increaed hip flexion needed, especially with the taller side of the bolster; standing with UE support on chair: alternating laterally stepping out with same side reaching up for 6-7 reps each side, then alternating laterally stepping with same side reaching out to the  side for 8 reps each side. visual/verbal cues needed for form, increased stepping out and increased reaching.      Knee/Hip Exercises: Aerobic   Other Aerobic Scifit level 2 wtih UE/LE's x 8 minutes with goal >/= 50 steps per minute for strengthening, activity tolerance and reciprocal movement patterns.                      PT Short Term Goals - 06/19/21 0816       PT SHORT TERM GOAL #1   Title Pt will perform HEP with family supervision for improved strength, balance, transfers, and gait.  TARGET 06/20/2021    Time 4    Period Weeks    Status Achieved      PT SHORT TERM GOAL #2   Title Pt will improve 5x sit<>stand to less than or equal to 20 sec to demonstrate improved functional strength and transfer efficiency.    Baseline 25.03 sec; 17.84 sec 06/19/2021    Time 4    Period Weeks    Status Achieved      PT SHORT TERM GOAL #3   Title Pt will verbalize understanding of fall prevention in home environment.    Time 4    Period Weeks    Status Achieved      PT SHORT TERM GOAL #4   Title Pt will improve TUG score to less than or equal to 40 sec for decreased fall risk.    Baseline 51.22 sec; 33.28 sec 06/19/21    Time 8    Period Weeks    Status Achieved               PT Long Term Goals - 05/27/21 0906       PT LONG TERM GOAL #1   Title Pt will perform progression of HEP with family supervision for improved strength, balance, transfers, and gait.  TARGET 07/18/2021    Time 8    Period Weeks    Status New      PT LONG TERM GOAL #2   Title Pt will improve 5x sit<>stand to less than or equal to 15 sec to demonstrate improved functional strength and transfer efficiency.    Time 8    Period Weeks    Status New      PT LONG TERM GOAL #3   Title Pt will improve gait velocity to at least 1.3 ft/sefc for improved gait efficiency and safety.    Baseline 0.82 ft/sec    Time 8    Period Weeks    Status New      PT LONG TERM GOAL #4   Title Pt will improve  TUG score to less than or equal to 30 sec for decreased fall risk.    Baseline 51.22 sec    Time 8    Period Weeks    Status New      PT LONG TERM GOAL #5   Title Pt will ambulate at least 500 ft, supervision, outdoor surfaces, with appropriat assistive device as needed, for improved community gait.    Time 8    Period Weeks    Status New                   Plan - 07/08/21 0849     Clinical Impression Statement Today's skilled session continued to focus on gait with rollator, strengthening and ex's to promote big movements/increased base of support with no issues noted or reported in session. The pt is making steady progress toward goals and should benefit from continued PT to progress toward unmet goals.    Personal Factors and Comorbidities Comorbidity 3+    Comorbidities PMH:  Alzheimer's, HTN, HLD, depression, anxiety, osteoporosis    Examination-Activity Limitations Locomotion Level;Transfers;Stairs;Stand    Examination-Participation Restrictions Community Activity;Other   Walking in the neighborhood   Stability/Clinical Decision Making Evolving/Moderate complexity    Rehab Potential Good    PT Frequency 2x / week    PT Duration 8 weeks   including eval week   PT Treatment/Interventions ADLs/Self Care Home Management;Gait training;Stair training;Functional mobility training;Therapeutic activities;Therapeutic exercise;Balance training;Neuromuscular re-education;Patient/family education;Passive range of motion;Manual techniques    PT Next Visit Plan Consider walking program for home-bring husband back to discuss (husband was not in waiting room during 07/03/21 session to ask to come back).  Would need to discuss if they would want to consider rollator for walking or continue HHA with gait.   Work on gait for increased step length and arm swing (use rollator in therapy session to help with posture, step length); use of seated stepper/NuStep for flexibility and strength.  Standing  balance, working on wider BOS.    PT Home Exercise Plan Access Code: [email or web address]    Consulted and Agree with Plan of Care Patient             Patient will benefit from skilled therapeutic intervention in order to improve the following deficits and impairments:  Abnormal gait, Difficulty walking, Decreased balance, Impaired flexibility, Decreased mobility, Decreased strength, Postural dysfunction  Visit Diagnosis: Other abnormalities of gait and mobility  Unsteadiness on feet  Muscle weakness (generalized)     Problem List Patient Active Problem List   Diagnosis Date Noted   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   Abnormal liver enzymes 09/19/2019   Blurred vision 09/19/2019   Hypercalcemia 03/15/2019   Major depression in full remission (HCC) 11/24/2018   Memory loss 12/24/2016   Encounter for well adult exam with abnormal findings 06/13/2013   Hyperparathyroidism, primary (HCC) 06/12/2013   GERD (gastroesophageal reflux disease) 02/19/2012   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

## 2021-07-08 NOTE — Progress Notes (Signed)
Requested by:  [name], FNP [address] [place],  [place] [address]    History of Present Illness   Brandi Walsh is a 75 y.o. (1946-05-02) female who presents for evaluation of bilateral lower and upper extremity swelling. She was previously seen by Dr. Lenell Antu in May at which time she was not able to tolerate her venous duplex due to discomfort. He felt at the time that a lot of her edema was likely cardiac in nature. He recommended compression and elevation. He recommended that she return in 3 months with repeat attempt at venus reflux duplex.  She presents today with her granddaughter. She has Alzheimer's dementia so she can provide only limited history. Her granddaughter who is present provides most of her history. She has swelling that progresses throughout day. It is improved with elevation. She has aid at home some days and family can occasionally assist in putting on compression stockings but she is unable to put them on and off herself or consistently remember to do so. They do help when she is able to wear them. She denies any pain, aching, throbbing, heaviness, bleeding, or ulceration in her legs. Her other concerns is upper extremity intermittent swelling. This seems to come and go in hands, forearms and up to elbow level. It also does not cause any pain. This has been occurring since about March of this year.  Venous symptoms include: swelling Onset/duration:  1.5 years Occupation:  retired Aggravating factors: (sitting, standing) Alleviating factors: elevation Compression:  yes Helps:  unable to tolerate Pain medications:  Mobic Previous vein procedures:  none History of DVT:  no  Past Medical History:  Diagnosis Date   Alzheimer's disease (HCC)    COUGH DUE TO ACE INHIBITORS 11/25/2009   Qualifier: Diagnosis of  By: Everardo All MD, Sean A    Depression    DIABETES MELLITUS, TYPE II 10/13/2007   Qualifier: Diagnosis of  By: Charlsie Quest RMA,  Lucy     HYPERLIPIDEMIA 10/13/2007   Qualifier: Diagnosis of  By: Tyrone Apple, Lucy     HYPERTENSION 10/13/2007   Qualifier: Diagnosis of  By: Samara Snide     MENOPAUSAL SYNDROME 10/13/2007   Qualifier: Diagnosis of  By: Charlsie Quest RMA, Lucy     OSTEOPOROSIS 10/13/2007   Qualifier: Diagnosis of  By: Samara Snide      Past Surgical History:  Procedure Laterality Date   ABDOMINAL HYSTERECTOMY     BREAST BIOPSY  08/2002   TONSILLECTOMY      Social History   Socioeconomic History   Marital status: Married    Spouse name: Not on file   Number of children: Not on file   Years of education: Not on file   Highest education level: Not on file  Occupational History   Occupation: Retired   Tobacco Use   Smoking status: Never   Smokeless tobacco: Never  Vaping Use   Vaping Use: Never used  Substance and Sexual Activity   Alcohol use: No   Drug use: No   Sexual activity: Yes  Other Topics Concern   Not on file  Social History Narrative   Left Handed   Two Story Home   Lives with Husband    Drinks coffee and [employer] sometimes   Social Determinants of Health   Financial Resource Strain: Not on file  Food Insecurity: Not on file  Transportation Needs: Not on file  Physical Activity: Not on file  Stress: Not on file  Social Connections: Not on file  Intimate Partner Violence: Not on file    Family History  Problem Relation Age of Onset   Anxiety disorder Mother    Hyperparathyroidism Sister    Cancer Neg Hx    Breast cancer Neg Hx     Current Outpatient Medications  Medication Sig Dispense Refill   alendronate (FOSAMAX) 70 MG tablet Take 70 mg by mouth once a week.     Cholecalciferol (VITAMIN D) 50 MCG (2000 UT) tablet Take 2,000 Units by mouth daily.     donepezil (ARICEPT) 10 MG tablet Take 1 tablet daily 90 tablet 3   escitalopram (LEXAPRO) 10 MG tablet TAKE 1 & 1/2 (ONE & ONE-HALF) TABLETS BY MOUTH ONCE DAILY 135 tablet 2   losartan (COZAAR) 50 MG tablet  Take 1 tablet by mouth once daily 90 tablet 0   meloxicam (MOBIC) 15 MG tablet Take 1 tablet by mouth once daily 30 tablet 0   mupirocin ointment (BACTROBAN) 2 % Apply topically 3 (three) times daily. 30 g 1   omeprazole (PRILOSEC) 20 MG capsule Take 1 capsule (20 mg total) by mouth daily. 90 capsule 5   rosuvastatin (CRESTOR) 40 MG tablet Take 1 tablet by mouth once daily 90 tablet 0   No current facility-administered medications for this visit.    Allergies  Allergen Reactions   Lovastatin     REACTION: Rash    REVIEW OF SYSTEMS (negative unless checked):   Cardiac:  []  Chest pain or chest pressure? []  Shortness of breath upon activity? []  Shortness of breath when lying flat? []  Irregular heart rhythm?  Vascular:  []  Pain in calf, thigh, or hip brought on by walking? []  Pain in feet at night that wakes you up from your sleep? []  Blood clot in your veins? [x]  Leg swelling?  Pulmonary:  []  Oxygen at home? []  Productive cough? []  Wheezing?  Neurologic:  []  Sudden weakness in arms or legs? []  Sudden numbness in arms or legs? []  Sudden onset of difficult speaking or slurred speech? []  Temporary loss of vision in one eye? []  Problems with dizziness?  Gastrointestinal:  []  Blood in stool? []  Vomited blood?  Genitourinary:  []  Burning when urinating? []  Blood in urine?  Psychiatric:  []  Major depression  Hematologic:  []  Bleeding problems? []  Problems with blood clotting?  Dermatologic:  []  Rashes or ulcers?  Constitutional:  []  Fever or chills?  Ear/Nose/Throat:  []  Change in hearing? []  Nose bleeds? []  Sore throat?  Musculoskeletal:  []  Back pain? []  Joint pain? []  Muscle pain?   Physical Examination     Vitals:   07/08/21 1354  BP: 114/70  Pulse: 75  Temp: 97.7 F (36.5 C)  TempSrc: Oral  SpO2: 97%  Weight: 133 lb 12.8 oz (60.7 kg)  Height: 5' (1.524 m)   Body mass index is 26.13 kg/m.  General:  WDWN in NAD; vital signs  documented above Gait: Normal HENT: WNL, normocephalic Pulmonary: normal non-labored breathing  Cardiac: regular HR, without  Murmurs without carotid bruit Abdomen: soft, NT, no masses Vascular Exam/Pulses:  Right Left  Radial 2+ (normal) 2+ (normal)  Brachial 2+ (normal) 2+ (normal)  Femoral 2+ (normal) 2+ (normal)  Popliteal 2+ (normal) 2+ (normal)  DP 1+ (weak) 1+ (weak)  PT 2+ (normal) 1+ (weak)   Extremities: without varicose veins, without reticular veins, with edema, without stasis pigmentation, without lipodermatosclerosis, without ulcers Musculoskeletal: no muscle wasting or atrophy  Neurologic: A&O  X 3;  No focal weakness or paresthesias are detected Psychiatric:  The pt has Normal affect.  Non-invasive Vascular Imaging   BLE Venous Insufficiency Duplex (07/08/21):  RLE:  No DVT and SVT,  GSV reflux SFJ, distal thigh GSV diameter 0.154 - 0.614 No SSV reflux  CFV deep venous reflux  LLE: No DVT and SVT GSV reflux at SFJ GSV diameter 0.313 - 0.573 No SSV reflux  CFV and popliteal deep venous reflux   Medical Decision Making   KAILI CASTILLE is a 75 y.o. female who presents with: BLE chronic venous insufficiency with edema and upper extremity edema. Lower extremity venous duplex shows no DVT or SVT. She does have both deep and superficial venous reflux. Based on the patient's history and examination, I recommend continued conservative therapy with elevation, compression, refraining from prolonged sitting or standing,and exercise. Based off of her duplex I would not recommend lazer venous ablation. There may be component of cardiac source vs medication site effect also playing from in her lower extremity and certainly her upper extremity swelling. Recommend she follow up with PCP for further evaluation of this. In the mean time I have additionally recommended elevating her upper extremities as needed. She will follow up with [place] as needed if she has new or concerning  symptoms   Brandi Congress, PA-C [facility] Office: [phone]  07/08/2021, 2:26 PM  Clinic MD: Steve Rattler

## 2021-07-08 NOTE — Therapy (Addendum)
[facility] [address] [place], [place], [address] Phone: [phone]   Fax:  [phone]  Occupational Therapy Treatment  Patient Details  Name: Brandi Walsh MRN: [id number] Date of Birth: 05-08-1946 No data recorded  Encounter Date: 07/08/2021   OT End of Session - 07/08/21 0813     Visit Number 8    Number of Visits 17    Date for OT Re-Evaluation 09/10/21    Authorization Type U[employer] MCR    Authorization - Number of Visits 8    Progress Note Due on Visit 10    OT Start Time 0807    OT Stop Time 0845    OT Time Calculation (min) 38 min    Activity Tolerance Patient tolerated treatment well    Behavior During Therapy Flat affect             Past Medical History:  Diagnosis Date   Alzheimer's disease (HCC)    COUGH DUE TO ACE INHIBITORS 11/25/2009   Qualifier: Diagnosis of  By: Everardo All MD, Sean A    Depression    DIABETES MELLITUS, TYPE II 10/13/2007   Qualifier: Diagnosis of  By: Samara Snide     HYPERLIPIDEMIA 10/13/2007   Qualifier: Diagnosis of  By: Samara Snide     HYPERTENSION 10/13/2007   Qualifier: Diagnosis of  By: Samara Snide     MENOPAUSAL SYNDROME 10/13/2007   Qualifier: Diagnosis of  By: Charlsie Quest RMA, Lucy     OSTEOPOROSIS 10/13/2007   Qualifier: Diagnosis of  By: Samara Snide      Past Surgical History:  Procedure Laterality Date   ABDOMINAL HYSTERECTOMY     BREAST BIOPSY  08/2002   TONSILLECTOMY      There were no vitals filed for this visit.   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 Medical history includes: Alzheimers, HTN, HLD, depression, anxiety, osteoporosis    Patient Stated Goals improvement in Lt hand    Currently in Pain? No/denies              Supine, cane exercises for shoulder flex, horizontal abduction, chest press with min-mod cueing.  Then bilateral  scapular depression, ER, with min-mod cueing for large amplitude movements and correct performance.    PWR! Up, rock, and twist in supine with min facilitation and mod cueing including visual targets for large amplitude and large amplitude movement patterns with ball between knees with twist.  Supine>sitting using large amplitude movement strategies with min facilitation/cues.  Sitting with feet supported, closed-chain UE movement floor>overhead with min-mod cues/visual targets for large amplitude, then functional reaching forward with wt. Shift and laterally/in ER to flip cards with emphasis on trunk rotation, wt. Shift, and large amplitude movements with min-mod cueing and visual targets.  Then forward/overhead reaching with each UE with min cueing/set-up for large amplitude movements.  Tossing/catching ball with BUEs with min cueing for open hands/wrist ext, elbow ext and incr power to movement.          OT Short Term Goals - 07/03/21 4268       OT SHORT TERM GOAL #1   Title Independent with HEP for BUE ROM/endurance and hand coordination    Time 4    Period Weeks    Status On-going      OT SHORT TERM GOAL #2   Title Pt/family to verbalize understanding  with edema management strategies for Lt hand including compression glove    Time 4    Period Weeks    Status On-going      OT SHORT TERM GOAL #3   Title Pt to perform UB dressing/bathing with min assist    Time 4    Period Weeks    Status New      OT SHORT TERM GOAL #4   Title Pt to perform LB dressing/bathing with mod assist    Time 4    Period Weeks    Status New      OT SHORT TERM GOAL #5   Title Pt/family to verbalize understanding with memory strategies and cognitive tips to increase participation in ADLS including some involvement in medication management    Time 4    Period Weeks    Status New               OT Long Term Goals - 06/10/21 1020       OT LONG TERM GOAL #1   Title Independent with updated  HEP prn    Time 8    Period Weeks    Status New      OT LONG TERM GOAL #2   Title Pt to improve BUE function as evidenced by improving Box & Blocks score to 20 or greater    Baseline Rt/Lt = 17    Time 8    Period Weeks    Status New      OT LONG TERM GOAL #3   Title Pt to demo sufficient bilateral shoulder ROM to get shirt overhead and wash hair    Time 8    Period Weeks    Status New      OT LONG TERM GOAL #4   Title Pt to improve bilateral hand coordination as evidenced by reducing speed on 9 hole pegt est to 60 sec or under    Baseline Rt = 69.18 sec, Lt = 64.38 sec    Time 8    Period Weeks    Status New      OT LONG TERM GOAL #5   Title Pt to be overall min assist with BADLS    Time 8    Period Weeks    Status New      Long Term Additional Goals   Additional Long Term Goals Yes      OT LONG TERM GOAL #6   Title Pt to perform simple dyanmic standing task (brushing teeth, folding laundry) for 10 min. w/o LOB using one hand countertop support prn    Time 8    Period Weeks    Status New                   Plan - 07/08/21 7616     Clinical Impression Statement Pt is progressing towards goals with improving UE functional use.    OT Occupational Profile and History Detailed Assessment- Review of Records and additional review of physical, cognitive, psychosocial history related to current functional performance    Occupational performance deficits (Please refer to evaluation for details): ADL's;IADL's;Social Participation    Body Structure / Function / Physical Skills ADL;Decreased knowledge of use of DME;Strength;Balance;Dexterity;GMC;Tone;Edema;Body mechanics;UE functional use;IADL;ROM;Sensation;Mobility;Flexibility;Coordination;Decreased knowledge of precautions;FMC    Cognitive Skills Energy/Drive;Memory;Problem Solve;Safety Awareness    Rehab Potential Good    Clinical Decision Making Several treatment options, min-mod task modification necessary     Comorbidities Affecting Occupational Performance: Presence of comorbidities impacting occupational  performance    Comorbidities impacting occupational performance description: Alzheimers, osteoporosis    Modification or Assistance to Complete Evaluation  Min-Moderate modification of tasks or assist with assess necessary to complete eval    OT Frequency 2x / week    OT Duration 8 weeks   or 16 visits over 3 months (d/t scheduling & transporation conflicts) plus eval.   OT Treatment/Interventions Self-care/ADL training;DME and/or AE instruction;Splinting;Compression bandaging;Therapeutic activities;Ultrasound;Therapeutic exercise;Cognitive remediation/compensation;Coping strategies training;Neuromuscular education;Functional Mobility Training;Passive range of motion;Visual/perceptual remediation/compensation;Manual Therapy;Patient/family education    Plan family education re: allowing pt to participate more with BADL's w/ PRN assist only when needed. (no family present today), check STGs    Consulted and Agree with Plan of Care Patient             Patient will benefit from skilled therapeutic intervention in order to improve the following deficits and impairments:   Body Structure / Function / Physical Skills: ADL, Decreased knowledge of use of DME, Strength, Balance, Dexterity, GMC, Tone, Edema, Body mechanics, UE functional use, IADL, ROM, Sensation, Mobility, Flexibility, Coordination, Decreased knowledge of precautions, FMC Cognitive Skills: Energy/Drive, Memory, Problem Solve, Safety Awareness     Visit Diagnosis: Other symptoms and signs involving the musculoskeletal system  Other symptoms and signs involving the nervous system  Other lack of coordination  Attention and concentration deficit  Localized edema  Unsteadiness on feet  Other abnormalities of gait and mobility  Abnormal posture    Problem List Patient Active Problem List   Diagnosis Date Noted   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   Abnormal liver enzymes 09/19/2019   Blurred vision 09/19/2019   Hypercalcemia 03/15/2019   Major depression in full remission (HCC) 11/24/2018   Memory loss 12/24/2016   Encounter for well adult exam with abnormal findings 06/13/2013   Hyperparathyroidism, primary (HCC) 06/12/2013   GERD (gastroesophageal reflux disease) 02/19/2012   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   COUGH DUE TO ACE INHIBITORS 11/25/2009   Diabetes (HCC) 10/13/2007   Dyslipidemia 10/13/2007   Essential hypertension 10/13/2007   MENOPAUSAL SYNDROME 10/13/2007   Osteoporosis 10/13/2007    [facility] 07/08/2021, 8:17 AM  [facility] [address] [place], [place], [address] Phone: [phone]   Fax:  [phone]  Name: Brandi Walsh MRN: [id number] Date of Birth: [birth year]  Willa Frater, OTR/L [facility] [address] [place], [place]  [address] [phone] phone [phone] 07/08/21 8:20 AM

## 2021-07-10 ENCOUNTER — Ambulatory Visit: Payer: Medicare Other | Admitting: Physical Therapy

## 2021-07-10 ENCOUNTER — Encounter: Payer: Self-pay | Admitting: Physical Therapy

## 2021-07-10 ENCOUNTER — Other Ambulatory Visit: Payer: Self-pay

## 2021-07-10 ENCOUNTER — Encounter: Payer: Self-pay | Admitting: Occupational Therapy

## 2021-07-10 ENCOUNTER — Ambulatory Visit: Payer: Medicare Other | Admitting: Occupational Therapy

## 2021-07-10 DIAGNOSIS — R293 Abnormal posture: Secondary | ICD-10-CM

## 2021-07-10 DIAGNOSIS — R600 Localized edema: Secondary | ICD-10-CM

## 2021-07-10 DIAGNOSIS — M6281 Muscle weakness (generalized): Secondary | ICD-10-CM

## 2021-07-10 DIAGNOSIS — R278 Other lack of coordination: Secondary | ICD-10-CM

## 2021-07-10 DIAGNOSIS — R29898 Other symptoms and signs involving the musculoskeletal system: Secondary | ICD-10-CM

## 2021-07-10 DIAGNOSIS — R2689 Other abnormalities of gait and mobility: Secondary | ICD-10-CM | POA: Diagnosis not present

## 2021-07-10 DIAGNOSIS — R2681 Unsteadiness on feet: Secondary | ICD-10-CM

## 2021-07-10 DIAGNOSIS — R29818 Other symptoms and signs involving the nervous system: Secondary | ICD-10-CM

## 2021-07-10 DIAGNOSIS — R41840 Attention and concentration deficit: Secondary | ICD-10-CM

## 2021-07-10 NOTE — Therapy (Signed)
[facility] [address] [place], [place], [address] Phone: [phone]   Fax:  [phone]-[phone]  Physical Therapy Treatment  Patient Details  Name: Brandi GitelmanBrenda P Mcniel MRN: [id number] Date of Birth: Jun 29, 1946 Referring Provider (PT): Patrcia DollyAquino, Karen   Encounter Date: 07/10/2021   PT End of Session - 07/10/21 0808     Visit Number 11    Number of Visits 16    Date for PT Re-Evaluation 07/18/21    Authorization Type UHC medicare    Progress Note Due on Visit 19   was completed at visit 9   PT Start Time 0805    PT Stop Time 0844    PT Time Calculation (min) 39 min    Equipment Utilized During Treatment Gait belt    Activity Tolerance Patient tolerated treatment well    Behavior During Therapy [facility] for tasks assessed/performed             Past Medical History:  Diagnosis Date   Alzheimer's disease (HCC)    COUGH DUE TO ACE INHIBITORS 11/25/2009   Qualifier: Diagnosis of  By: Everardo AllEllison MD, Sean A    Depression    DIABETES MELLITUS, TYPE II 10/13/2007   Qualifier: Diagnosis of  By: Samara SnideBrand RMA, Lucy     HYPERLIPIDEMIA 10/13/2007   Qualifier: Diagnosis of  By: Samara SnideBrand RMA, Lucy     HYPERTENSION 10/13/2007   Qualifier: Diagnosis of  By: Samara SnideBrand RMA, Lucy     MENOPAUSAL SYNDROME 10/13/2007   Qualifier: Diagnosis of  By: Charlsie QuestBrand RMA, Lucy     OSTEOPOROSIS 10/13/2007   Qualifier: Diagnosis of  By: Samara SnideBrand RMA, Lucy      Past Surgical History:  Procedure Laterality Date   ABDOMINAL HYSTERECTOMY     BREAST BIOPSY  08/2002   TONSILLECTOMY      There were no vitals filed for this visit.   Subjective Assessment - 07/10/21 0807     Subjective No new complatins. No falls or pain at this time. Felt okay after last session.    Patient is accompained by: Family member   spouse dropped pt off   Pertinent History MH:  Alzheimer's, HTN, HLD, depression, anxiety, osteoporosis    Patient Stated Goals Pt's goals are to walk better.     Currently in Pain? No/denies    Pain Score 0-No pain                    [facility] Adult PT Treatment/Exercise - 07/10/21 0809       Transfers   Transfers Sit to Stand;Stand to Sit    Sit to Stand 5: Supervision;With upper extremity assist;From bed;From chair/3-in-1    Stand to Sit 5: Supervision;With upper extremity assist;To bed;To chair/3-in-1      Ambulation/Gait   Ambulation/Gait Yes    Ambulation/Gait Assistance 5: Supervision;4: Min guard    Ambulation/Gait Assistance Details no device to enter session with min guard/HHA. use of rollator in session with cues for walker position and occasional reminder cues for increased step lenght. Pt able to demo increased gait speed and sride length with use of rollator.    Ambulation Distance (Feet) 115 Feet   x1, 230 x1, plus around clinic with session   Assistive device Rollator;None    Gait Pattern Step-through pattern;Decreased arm swing - right;Decreased arm swing - left;Step-to pattern;Decreased step length - right;Decreased step length - left;Decreased dorsiflexion - right;Decreased dorsiflexion - left;Right flexed knee in stance;Left flexed knee in stance;Trunk flexed;Poor foot clearance -  left;Poor foot clearance - right;Narrow base of support    Ambulation Surface Level;Indoor      High Level Balance   High Level Balance Activities Negotitating around obstacles    High Level Balance Comments with rollator working on weaving around 3 cones with 180 degree turn at each end for 2 laps with min guard assist, cues to stay with walker with the turns and for increased step length.      Neuro Re-ed    Neuro Re-ed Details  for balance/NMR: standing with UE support on sturdy surface with feet wide apart- lateral weight shifting  x10 to each side, then added in same side UE upward reaching with weight shifting for 10 reps each side, min guard assist with verbal/visual cues on technique; in wide staggered stance working on weight shifting  forward/backwards for 10 reps each way with each foot forward, min guard assist for safety with cues on form and technique.      Knee/Hip Exercises: Aerobic   Other Aerobic Scifit level 2.5 wtih UE/LE's x 8 minutes with goal >/= 50 steps per minute for strengthening, activity tolerance and reciprocal movement patterns.                  PT Short Term Goals - 06/19/21 0816       PT SHORT TERM GOAL #1   Title Pt will perform HEP with family supervision for improved strength, balance, transfers, and gait.  TARGET 06/20/2021    Time 4    Period Weeks    Status Achieved      PT SHORT TERM GOAL #2   Title Pt will improve 5x sit<>stand to less than or equal to 20 sec to demonstrate improved functional strength and transfer efficiency.    Baseline 25.03 sec; 17.84 sec 06/19/2021    Time 4    Period Weeks    Status Achieved      PT SHORT TERM GOAL #3   Title Pt will verbalize understanding of fall prevention in home environment.    Time 4    Period Weeks    Status Achieved      PT SHORT TERM GOAL #4   Title Pt will improve TUG score to less than or equal to 40 sec for decreased fall risk.    Baseline 51.22 sec; 33.28 sec 06/19/21    Time 8    Period Weeks    Status Achieved               PT Long Term Goals - 05/27/21 0906       PT LONG TERM GOAL #1   Title Pt will perform progression of HEP with family supervision for improved strength, balance, transfers, and gait.  TARGET 07/18/2021    Time 8    Period Weeks    Status New      PT LONG TERM GOAL #2   Title Pt will improve 5x sit<>stand to less than or equal to 15 sec to demonstrate improved functional strength and transfer efficiency.    Time 8    Period Weeks    Status New      PT LONG TERM GOAL #3   Title Pt will improve gait velocity to at least 1.3 ft/sefc for improved gait efficiency and safety.    Baseline 0.82 ft/sec    Time 8    Period Weeks    Status New      PT LONG TERM GOAL #4   Title Pt will  improve TUG score to less than or equal to 30 sec for decreased fall risk.    Baseline 51.22 sec    Time 8    Period Weeks    Status New      PT LONG TERM GOAL #5   Title Pt will ambulate at least 500 ft, supervision, outdoor surfaces, with appropriat assistive device as needed, for improved community gait.    Time 8    Period Weeks    Status New                   Plan - 07/10/21 1937     Clinical Impression Statement Today's skilled session continued to focus on strengthening, balance with emphasis on increased base of support and gait with RW. Pt continues to have improved balance and gait mechanics with use of rollator vs HHA. Briefly mentioned use of rollator to spouse in lobby with spouse stating "no". May be of benefit to have him come back for a session to see how pt does with rollator as pt is in agreement to using this device. No issues noted or reported in session. The pt is progressing toward goals and should benefit from continued PT to progress toward unmet goals.    Personal Factors and Comorbidities Comorbidity 3+    Comorbidities PMH:  Alzheimer's, HTN, HLD, depression, anxiety, osteoporosis    Examination-Activity Limitations Locomotion Level;Transfers;Stairs;Stand    Examination-Participation Restrictions Community Activity;Other   Walking in the neighborhood   Stability/Clinical Decision Making Evolving/Moderate complexity    Rehab Potential Good    PT Frequency 2x / week    PT Duration 8 weeks   including eval week   PT Treatment/Interventions ADLs/Self Care Home Management;Gait training;Stair training;Functional mobility training;Therapeutic activities;Therapeutic exercise;Balance training;Neuromuscular re-education;Patient/family education;Passive range of motion;Manual techniques    PT Next Visit Plan Consider walking program for home-bring husband back to discuss (husband was not in waiting room during 07/03/21 session to ask to come back). Readdress use of  rollator for home when spouse is present for a session as pt stated she is agreeable, spouse stated "no".   Work on gait for increased step length and arm swing (use rollator in therapy session to help with posture, step length); use of seated stepper/NuStep for flexibility and strength.  Standing balance, working on wider BOS.    PT Home Exercise Plan Access Code: [email or web address]    Consulted and Agree with Plan of Care Patient             Patient will benefit from skilled therapeutic intervention in order to improve the following deficits and impairments:  Abnormal gait, Difficulty walking, Decreased balance, Impaired flexibility, Decreased mobility, Decreased strength, Postural dysfunction  Visit Diagnosis: Other abnormalities of gait and mobility  Unsteadiness on feet  Muscle weakness (generalized)     Problem List Patient Active Problem List   Diagnosis Date Noted   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   Abnormal liver enzymes 09/19/2019   Blurred vision 09/19/2019   Hypercalcemia 03/15/2019   Major depression in full remission (HCC) 11/24/2018   Memory loss 12/24/2016   Encounter for well adult exam with abnormal findings 06/13/2013   Hyperparathyroidism, primary (HCC) 06/12/2013   GERD (gastroesophageal reflux disease) 02/19/2012   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   COUGH DUE TO ACE INHIBITORS 11/25/2009   Diabetes (HCC) 10/13/2007   Dyslipidemia 10/13/2007   Essential hypertension 10/13/2007   MENOPAUSAL SYNDROME 10/13/2007   Osteoporosis 10/13/2007    Massie Bougie, CLT [facility] [address] [place], [place] [address] [phone] 07/10/21, 2:38 PM   Name: REMINGTON SKALSKY MRN: [id number] Date of Birth: Nov 30, 1946

## 2021-07-10 NOTE — Therapy (Signed)
[facility] [address] [place], [place], [address] Phone: [phone]   Fax:  [phone]  Occupational Therapy Treatment  Patient Details  Name: Brandi Walsh MRN: [id number] Date of Birth: [birth year] No data recorded  Encounter Date: 07/10/2021   OT End of Session - 07/10/21 0858     Visit Number 9    Number of Visits 17    Date for OT Re-Evaluation 09/10/21    Authorization Type UHC MCR    Authorization - Number of Visits 9    Progress Note Due on Visit 10    OT Start Time [phone]    OT Stop Time 0930    OT Time Calculation (min) 38 min    Activity Tolerance Patient tolerated treatment well    Behavior During Therapy Flat affect             Past Medical History:  Diagnosis Date   Alzheimer's disease (HCC)    COUGH DUE TO ACE INHIBITORS 11/25/2009   Qualifier: Diagnosis of  By: Everardo All MD, Sean A    Depression    DIABETES MELLITUS, TYPE II 10/13/2007   Qualifier: Diagnosis of  By: Samara Snide     HYPERLIPIDEMIA 10/13/2007   Qualifier: Diagnosis of  By: Samara Snide     HYPERTENSION 10/13/2007   Qualifier: Diagnosis of  By: Samara Snide     MENOPAUSAL SYNDROME 10/13/2007   Qualifier: Diagnosis of  By: Charlsie Quest RMA, Lucy     OSTEOPOROSIS 10/13/2007   Qualifier: Diagnosis of  By: Samara Snide      Past Surgical History:  Procedure Laterality Date   ABDOMINAL HYSTERECTOMY     BREAST BIOPSY  08/2002   TONSILLECTOMY      There were no vitals filed for this visit.   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 Medical history includes: Alzheimers, HTN, HLD, depression, anxiety, osteoporosis    Patient Stated Goals improvement in Lt hand    Currently in Pain? No/denies              Checked STGs and discussed progress--see below.  Reviewed edema management strategies for L hand and pt able to  verbalize exercise, glove use (min cues initially for edema massage).  Supine, cane exercises for shoulder flex, abduction, chest press with min-mod cueing.  Then bilateral scapular depression, ER, with min-mod cueing for large amplitude movements and correct performance.    PWR! Up, rock, and twist in supine with min facilitation and mod cueing including visual targets for large amplitude and large amplitude movement patterns.  Supine>sitting using large amplitude movement strategies with min facilitation/cues.  Sitting with feet supported, closed-chain UE movement floor>overhead with min-mod cues/visual targets for large amplitude.  Reviewed bag HEP for simulated ADLs.--pt returned demo each with min cueing.  PWR! Hands for stretch/large amplitude with min cueing.         OT Short Term Goals - 07/10/21 0859       OT SHORT TERM GOAL #1   Title Independent with HEP for BUE ROM/endurance and hand coordination    Time 4    Period Weeks    Status On-going   07/10/21:  min cueing     OT SHORT TERM GOAL #2   Title Pt/family to verbalize understanding with edema management strategies for Lt hand including compression glove    Time 4  Period Weeks    Status Achieved   07/10/21 per pt report     OT SHORT TERM GOAL #3   Title Pt to perform UB dressing/bathing with min assist    Time 4    Period Weeks    Status Achieved   07/10/21:  mod I per pt report     OT SHORT TERM GOAL #4   Title Pt to perform LB dressing/bathing with mod assist    Time 4    Period Weeks    Status Achieved   07/10/21 mod I per pt report (only needs assist for tub/shower transfer)     OT SHORT TERM GOAL #5   Title Pt/family to verbalize understanding with memory strategies and cognitive tips to increase participation in ADLS including some involvement in medication management    Time 4    Period Weeks    Status On-going   07/10/21:  family performs medication management              OT Long Term Goals  - 06/10/21 1020       OT LONG TERM GOAL #1   Title Independent with updated HEP prn    Time 8    Period Weeks    Status New      OT LONG TERM GOAL #2   Title Pt to improve BUE function as evidenced by improving Box & Blocks score to 20 or greater    Baseline Rt/Lt = 17    Time 8    Period Weeks    Status New      OT LONG TERM GOAL #3   Title Pt to demo sufficient bilateral shoulder ROM to get shirt overhead and wash hair    Time 8    Period Weeks    Status New      OT LONG TERM GOAL #4   Title Pt to improve bilateral hand coordination as evidenced by reducing speed on 9 hole pegt est to 60 sec or under    Baseline Rt = 69.18 sec, Lt = 64.38 sec    Time 8    Period Weeks    Status New      OT LONG TERM GOAL #5   Title Pt to be overall min assist with BADLS    Time 8    Period Weeks    Status New      Long Term Additional Goals   Additional Long Term Goals Yes      OT LONG TERM GOAL #6   Title Pt to perform simple dyanmic standing task (brushing teeth, folding laundry) for 10 min. w/o LOB using one hand countertop support prn    Time 8    Period Weeks    Status New                   Plan - 07/10/21 5366     Clinical Impression Statement Pt is progressing towards goals with improving UE functional use and reports improved ADL performance/participation.    OT Occupational Profile and History Detailed Assessment- Review of Records and additional review of physical, cognitive, psychosocial history related to current functional performance    Occupational performance deficits (Please refer to evaluation for details): ADL's;IADL's;Social Participation    Body Structure / Function / Physical Skills ADL;Decreased knowledge of use of DME;Strength;Balance;Dexterity;GMC;Tone;Edema;Body mechanics;UE functional use;IADL;ROM;Sensation;Mobility;Flexibility;Coordination;Decreased knowledge of precautions;FMC    Cognitive Skills Energy/Drive;Memory;Problem Solve;Safety  Awareness    Rehab Potential Good    Clinical Decision  Making Several treatment options, min-mod task modification necessary    Comorbidities Affecting Occupational Performance: Presence of comorbidities impacting occupational performance    Comorbidities impacting occupational performance description: Alzheimers, osteoporosis    Modification or Assistance to Complete Evaluation  Min-Moderate modification of tasks or assist with assess necessary to complete eval    OT Frequency 2x / week    OT Duration 8 weeks   or 16 visits over 3 months (d/t scheduling & transporation conflicts) plus eval.   OT Treatment/Interventions Self-care/ADL training;DME and/or AE instruction;Splinting;Compression bandaging;Therapeutic activities;Ultrasound;Therapeutic exercise;Cognitive remediation/compensation;Coping strategies training;Neuromuscular education;Functional Mobility Training;Passive range of motion;Visual/perceptual remediation/compensation;Manual Therapy;Patient/family education    Plan **Progress Note next session; if present, family education re: cognitive/memory compensation strategies/tips  (no family present today); review coordination HEP    Consulted and Agree with Plan of Care Patient             Patient will benefit from skilled therapeutic intervention in order to improve the following deficits and impairments:   Body Structure / Function / Physical Skills: ADL, Decreased knowledge of use of DME, Strength, Balance, Dexterity, GMC, Tone, Edema, Body mechanics, UE functional use, IADL, ROM, Sensation, Mobility, Flexibility, Coordination, Decreased knowledge of precautions, FMC Cognitive Skills: Energy/Drive, Memory, Problem Solve, Safety Awareness     Visit Diagnosis: Other symptoms and signs involving the musculoskeletal system  Other symptoms and signs involving the nervous system  Other lack of coordination  Attention and concentration deficit  Localized edema  Abnormal  posture  Unsteadiness on feet  Other abnormalities of gait and mobility    Problem List Patient Active Problem List   Diagnosis Date Noted   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   Abnormal liver enzymes 09/19/2019   Blurred vision 09/19/2019   Hypercalcemia 03/15/2019   Major depression in full remission (HCC) 11/24/2018   Memory loss 12/24/2016   Encounter for well adult exam with abnormal findings 06/13/2013   Hyperparathyroidism, primary (HCC) 06/12/2013   GERD (gastroesophageal reflux disease) 02/19/2012   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 [place], [place]  [address] [phone] phone [phone] 07/10/21 10:19 AM

## 2021-07-15 ENCOUNTER — Encounter: Payer: Self-pay | Admitting: Occupational Therapy

## 2021-07-15 ENCOUNTER — Ambulatory Visit: Payer: Medicare Other | Admitting: Physical Therapy

## 2021-07-15 ENCOUNTER — Telehealth: Payer: Self-pay

## 2021-07-15 ENCOUNTER — Other Ambulatory Visit: Payer: Self-pay

## 2021-07-15 ENCOUNTER — Ambulatory Visit: Payer: Medicare Other | Admitting: Occupational Therapy

## 2021-07-15 DIAGNOSIS — R2689 Other abnormalities of gait and mobility: Secondary | ICD-10-CM

## 2021-07-15 DIAGNOSIS — R293 Abnormal posture: Secondary | ICD-10-CM

## 2021-07-15 DIAGNOSIS — R2681 Unsteadiness on feet: Secondary | ICD-10-CM

## 2021-07-15 DIAGNOSIS — M6281 Muscle weakness (generalized): Secondary | ICD-10-CM

## 2021-07-15 DIAGNOSIS — R41840 Attention and concentration deficit: Secondary | ICD-10-CM

## 2021-07-15 DIAGNOSIS — R29818 Other symptoms and signs involving the nervous system: Secondary | ICD-10-CM

## 2021-07-15 DIAGNOSIS — R29898 Other symptoms and signs involving the musculoskeletal system: Secondary | ICD-10-CM

## 2021-07-15 DIAGNOSIS — R600 Localized edema: Secondary | ICD-10-CM

## 2021-07-15 DIAGNOSIS — R278 Other lack of coordination: Secondary | ICD-10-CM

## 2021-07-15 NOTE — Patient Instructions (Addendum)
COGNITIVE TIPS  Keep a journal/notebook with sections for the following (or use sections separately as needed):  calendar and appointment sheet, schedule for each day, lists of reminders (such as grocery lists or "to do" list), homework assignments for therapy, important information such as family and friends names/ addresses/ phone numbers, medications, medical history, and doctors name/ phone numbers.     Avoid/remove clutter and unnecessary items from areas such as countertops/cabinets in kitchen and bathroom, closets, etc.  Organize items by purpose.  Baskets and bins help with this.   Leave notes for reminders above task to be completed.  For example:  Note to turn off stove over the stove; note to lock door beside the door, note to brush teeth then wash face by sink, note to take medication on table, etc.  To help recall names of people or things, mentally or verbally go through the alphabet to try to determine the 1st letter of the word as this may trigger the name or word you are looking for.  If this is too difficulty and someone else knows the word, have them give you the first letter by asking, "does it start with an "A," "B," "C," etc." (or have them give you the first sound of the word or some other clue).  Review family events, occasions, names, etc.  Pictures are a good way to trigger memory.  Have others correct you if you answer something incorrectly.  Have them speak slowly with a few words to give you time to process and respond.  Don't let others automatically problem-solve.  (For example:  don't let them automatically lay out clothes in the correct position, but hand it to you folded so that you can figure it out for yourself.)  However, if you need help with tasks, they should give you as little as they can so that you can be successful.  If appropriate and safe, they may allow you to make mistakes so that you can figure out how to correct the error.  (For example, they may  allow you to put your shoes on the wrong foot to see if you notice that is wrong).  If you struggle, they should give you a cue.  (Example:  "Do your shoes feel right?"  "Do they look right?")     Take responsibility for your schedule (when someone is there to help you if needed).  Determine the day before how much time is needed for each task and as a result when you should start.  Determine whether or not you have enough money to purchase a given items.  Try to look for specific things that someone asks you to in the grocery store and try to pay with correct change.  Participate in bill paying with help.    Read a newspaper and discuss the content with someone.  Let them ask you specific questions about the article.  Watch a Scientist, product/process development and discuss it with someone.  Let someone quiz you about it. Let someone ask you about your day or your week.  (Example:  "What did you do today?"  What did you have for breakfast today?" etc.)  Try memorize words of a song, poem, or Bible verse.    Try to use the phone book when needed.  Make phone calls to friends/family members (with help if needed).  Other activities to encourage problem-solving, planning, concentration, and memory are as follows:    Crafts with written directions and repetition such  as:  jewelry making/stinging beads, painting, coloring, models, sorting coins, etc Board and card games are also good.  Start with easier games.  Let someone teach it to you and then try to teach it to them the next time.  Games such as:  card games, scrabble, yahtzee, [place], life, boggle, monopoly, checkers, backgammon, connect four, go fish, matching games, etc.   Perform simple daily activities in sitting or standing (with supervision):  folding towels/clothes, wiping table, sorting silverware, sorting clothes, stirring food, making sandwich, buttering toast, etc.

## 2021-07-15 NOTE — Patient Instructions (Signed)
Access Code: [email or web address] URL: [email or web address] Date: 07/15/2021 Prepared by: Lonia Blood  Exercises Seated Long Arc Quad - 1-2 x daily - 7 x weekly - 1-2 sets - 10 reps Seated Ankle Pumps on Table - 1-2 x daily - 7 x weekly - 1-2 sets - 10 reps Seated Hamstring Stretch - 1-2 x daily - 7 x weekly - 1 sets - 3 reps - 30 sec hold Standing March with Counter Support - 1 x daily - 5 x weekly - 1-2 sets - 10 reps Side to side weightshift - 1 x daily - 5 x weekly - 1-2 sets - 10 reps Heel Toe Raises with Counter Support - 1 x daily - 5 x weekly - 2 sets - 10 reps Standing Hip Abduction with Counter Support - 1 x daily - 5 x weekly - 2 sets - 10 reps Standing Hip Extension with Counter Support - 1 x daily - 5 x weekly - 2 sets - 10 reps  Added 07/15/21:  Staggered Stance Forward Backward Weight Shift with Counter Support - 1 x daily - 5 x weekly - 1-2 sets - 10 reps

## 2021-07-15 NOTE — Therapy (Signed)
[facility] [address] [place], [place], [address] Phone: [phone]   Fax:  [phone]  Occupational Therapy Treatment  Patient Details  Name: Brandi Walsh MRN: [id number] Date of Birth: 1946-02-14 No data recorded  Encounter Date: 07/15/2021   OT End of Session - 07/15/21 0851     Visit Number 10    Number of Visits 17    Date for OT Re-Evaluation 09/10/21    Authorization Type UHC MCR    Authorization - Number of Visits 10    Progress Note Due on Visit 10    OT Start Time 0850    OT Stop Time 0930    OT Time Calculation (min) 40 min    Activity Tolerance Patient tolerated treatment well    Behavior During Therapy Flat affect             Past Medical History:  Diagnosis Date   Alzheimer's disease ([place])    COUGH DUE TO ACE INHIBITORS 11/25/2009   Qualifier: Diagnosis of  By: [name] MD, Sean A    Depression    DIABETES MELLITUS, TYPE II 10/13/2007   Qualifier: Diagnosis of  By: Larose Kells     HYPERLIPIDEMIA 10/13/2007   Qualifier: Diagnosis of  By: Larose Kells     HYPERTENSION 10/13/2007   Qualifier: Diagnosis of  By: Larose Kells     MENOPAUSAL SYNDROME 10/13/2007   Qualifier: Diagnosis of  By: Marca Ancona RMA, Lucy     OSTEOPOROSIS 10/13/2007   Qualifier: Diagnosis of  By: Larose Kells      Past Surgical History:  Procedure Laterality Date   ABDOMINAL HYSTERECTOMY     BREAST BIOPSY  08/2002   TONSILLECTOMY      There were no vitals filed for this visit.   Subjective Assessment - 07/15/21 0851     Subjective  Denies pain.  Pt/husband report that pt is doing more with BADLs at home (supervision-min A).    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 Medical history includes: Alzheimers, HTN, HLD, depression, anxiety, osteoporosis    Patient Stated Goals improvement in Lt hand    Currently in Pain? No/denies               As husband present today, continued to check goals and discuss progress--see below.  Pt/husband encouraged to allow pt do as much as possible with BADLs and perform simple home maintenance tasks (and provided examples) in sitting or standing (with supervision).   Pt/husband verbalized understanding.  Writing name, address, phone number mod I and witting simple given "grocery list" with min prompts (asked what she likes for breakfast and lunch and write it down)  With cards spread out, performed tabletop visual scanning to locate cards in order with min-mod difficulty picking up cards and min occasional cues /prompts for sequencing.        OT Education - 07/15/21 0917     Education Details Cognitive/Memory  Tips/Strategies and Activities for home to incr participation and promote cognitive skills--see pt instructions    Person(s) Educated Patient;Spouse    Methods Explanation;Handout    Comprehension Verbalized understanding              OT Short Term Goals - 07/15/21 1254       OT SHORT TERM GOAL #1   Title Independent with HEP for BUE ROM/endurance and hand coordination  Time 4    Period Weeks    Status On-going   07/10/21:  min cueing     OT SHORT TERM GOAL #2   Title Pt/family to verbalize understanding with edema management strategies for Lt hand including compression glove    Time 4    Period Weeks    Status Achieved   07/10/21 per pt report     OT SHORT TERM GOAL #3   Title Pt to perform UB dressing/bathing with min assist    Time 4    Period Weeks    Status Achieved   07/10/21:  mod I per pt report     OT SHORT TERM GOAL #4   Title Pt to perform LB dressing/bathing with mod assist    Time 4    Period Weeks    Status Achieved   07/10/21 mod I per pt report (only needs assist for tub/shower transfer)     OT SHORT TERM GOAL #5   Title Pt/family to verbalize understanding with memory strategies and cognitive tips to increase participation in  [place] including some involvement in medication management    Time 4    Period Weeks    Status On-going   07/10/21:  family performs medication management (recommend family continue to assist with this).  07/15/21:  pt/family educated in cognitive/memory strategies and tips to incr participation in ADLs/IADLs              OT Long Term Goals - 07/15/21 0853       OT LONG TERM GOAL #1   Title Independent with updated HEP prn    Time 8    Period Weeks    Status New      OT LONG TERM GOAL #2   Title Pt to improve BUE function as evidenced by improving Box & Blocks score to 20 or greater    Baseline Rt/Lt = 17    Time 8    Period Weeks    Status New      OT LONG TERM GOAL #3   Title Pt to demo sufficient bilateral shoulder ROM to get shirt overhead and wash hair    Time 8    Period Weeks    Status Achieved   07/15/21     OT LONG TERM GOAL #4   Title Pt to improve bilateral hand coordination as evidenced by reducing speed on 9 hole pegt est to 60 sec or under    Baseline Rt = 69.18 sec, Lt = 64.38 sec    Time 8    Period Weeks    Status New      OT LONG TERM GOAL #5   Title Pt to be overall min assist with BADLS    Time 8    Period Weeks    Status Achieved   07/15/21:  min A per pt/husband     OT LONG TERM GOAL #6   Title Pt to perform simple dyanmic standing task (brushing teeth, folding laundry) for 10 min. w/o LOB using one hand countertop support prn    Time 8    Period Weeks    Status On-going                   Plan - 07/15/21 0852     Clinical Impression Statement Progress Note Reporting Period 06/10/21-07/15/21:  Pt is progressing towards goals with improving UE functional use and improved ADL performance/participation.  STGs 2-4 met and LTGs 3 & 5 met.  However, pt would benefit from continued occupational therapy to continue to address IADL participation, review cognitive tips, and improve coordination/UE functional use.    OT Occupational Profile and  History Detailed Assessment- Review of Records and additional review of physical, cognitive, psychosocial history related to current functional performance    Occupational performance deficits (Please refer to evaluation for details): ADL's;IADL's;Social Participation    Body Structure / Function / Physical Skills ADL;Decreased knowledge of use of DME;Strength;Balance;Dexterity;GMC;Tone;Edema;Body mechanics;UE functional use;IADL;ROM;Sensation;Mobility;Flexibility;Coordination;Decreased knowledge of precautions;FMC    Cognitive Skills Energy/Drive;Memory;Problem Solve;Safety Awareness    Rehab Potential Good    Clinical Decision Making Several treatment options, min-mod task modification necessary    Comorbidities Affecting Occupational Performance: Presence of comorbidities impacting occupational performance    Comorbidities impacting occupational performance description: Alzheimers, osteoporosis    Modification or Assistance to Complete Evaluation  Min-Moderate modification of tasks or assist with assess necessary to complete eval    OT Frequency 2x / week    OT Duration 8 weeks   or 16 visits over 3 months (d/t scheduling & transporation conflicts) plus eval.   OT Treatment/Interventions Self-care/ADL training;DME and/or AE instruction;Splinting;Compression bandaging;Therapeutic activities;Ultrasound;Therapeutic exercise;Cognitive remediation/compensation;Coping strategies training;Neuromuscular education;Functional Mobility Training;Passive range of motion;Visual/perceptual remediation/compensation;Manual Therapy;Patient/family education    Plan review coordination HEP, functional reaching, closed-chain shoulder ROM    Consulted and Agree with Plan of Care Patient             Patient will benefit from skilled therapeutic intervention in order to improve the following deficits and impairments:   Body Structure / Function / Physical Skills: ADL, Decreased knowledge of use of DME, Strength,  Balance, Dexterity, GMC, Tone, Edema, Body mechanics, UE functional use, IADL, ROM, Sensation, Mobility, Flexibility, Coordination, Decreased knowledge of precautions, [place] Cognitive Skills: Energy/Drive, Memory, Problem Solve, Safety Awareness     Visit Diagnosis: Other symptoms and signs involving the nervous system  Other symptoms and signs involving the musculoskeletal system  Other lack of coordination  Attention and concentration deficit  Localized edema  Other abnormalities of gait and mobility  Unsteadiness on feet  Abnormal posture  Muscle weakness (generalized)    Problem List Patient Active Problem List   Diagnosis Date Noted   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   Abnormal liver enzymes 09/19/2019   Blurred vision 09/19/2019   Hypercalcemia 03/15/2019   Major depression in full remission ([place]) 11/24/2018   Memory loss 12/24/2016   Encounter for well adult exam with abnormal findings 06/13/2013   Hyperparathyroidism, primary ([place]) 06/12/2013   GERD (gastroesophageal reflux disease) 02/19/2012   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 [place], [place], [address] Phone: [phone]   Fax:  [phone]  Name: Brandi Walsh MRN: [id number] Date of Birth: 1946/11/24  Vianne Bulls, OTR/L [facility] [address] [place], [place]  [address] [phone] phone [phone] 07/15/21 1:06 PM

## 2021-07-15 NOTE — Therapy (Signed)
[facility] [address] [place], [place], [address] Phone: [phone]   Fax:  [phone]  Physical Therapy Treatment  Patient Details  Name: Brandi Walsh MRN: [id number] Date of Birth: July 09, 1946 Referring Provider (PT): Ellouise Newer   Encounter Date: 07/15/2021   PT End of Session - 07/15/21 0805     Visit Number 12    Number of Visits 16    Date for PT Re-Evaluation 07/18/21    Authorization Type UHC medicare    Progress Note Due on Visit 19   was completed at visit 9   PT Start Time 0805    PT Stop Time 0843    PT Time Calculation (min) 38 min    Equipment Utilized During Treatment Gait belt    Activity Tolerance Patient tolerated treatment well    Behavior During Therapy [facility] for tasks assessed/performed             Past Medical History:  Diagnosis Date   Alzheimer's disease ([place])    COUGH DUE TO ACE INHIBITORS 11/25/2009   Qualifier: Diagnosis of  By: [name] MD, Sean A    Depression    DIABETES MELLITUS, TYPE II 10/13/2007   Qualifier: Diagnosis of  By: Larose Kells     HYPERLIPIDEMIA 10/13/2007   Qualifier: Diagnosis of  By: Larose Kells     HYPERTENSION 10/13/2007   Qualifier: Diagnosis of  By: Larose Kells     MENOPAUSAL SYNDROME 10/13/2007   Qualifier: Diagnosis of  By: Marca Ancona RMA, Lucy     OSTEOPOROSIS 10/13/2007   Qualifier: Diagnosis of  By: Larose Kells      Past Surgical History:  Procedure Laterality Date   ABDOMINAL HYSTERECTOMY     BREAST BIOPSY  08/2002   TONSILLECTOMY      There were no vitals filed for this visit.   Subjective Assessment - 07/15/21 0805     Subjective No complaints, no falls.  Husband present and reports pt is performing exercises at home; also, she is walking at home with caregiver or family at least 3x/wk 1/4 mile.  He is not interested in rollator for her, as he reports it may just get in the way.    Patient is accompained by: Family  member   Husband   Pertinent History MH:  Alzheimer's, HTN, HLD, depression, anxiety, osteoporosis    Patient Stated Goals Pt's goals are to walk better.    Currently in Pain? No/denies                               [facility] Adult PT Treatment/Exercise - 07/15/21 0001       Transfers   Transfers Sit to Stand;Stand to Sit    Sit to Stand 5: Supervision;With upper extremity assist;From bed;From chair/3-in-1    Sit to Stand Details Verbal cues for sequencing;Verbal cues for technique;Tactile cues for placement    Stand to Sit 5: Supervision;With upper extremity assist;To bed;To chair/3-in-1    Number of Reps 2 sets;Other reps (comment)   5 reps from mat   Comments Additional 5 reps from mat surface, 9.82 seconds without UE support.      Ambulation/Gait   Ambulation/Gait Yes    Ambulation/Gait Assistance 5: Supervision;4: Min guard    Ambulation Distance (Feet) 115 Feet   80 ft, 40 ft x 2   Assistive device None    Gait Pattern Step-through pattern;Decreased  arm swing - right;Decreased arm swing - left;Step-to pattern;Decreased step length - right;Decreased step length - left;Decreased dorsiflexion - right;Decreased dorsiflexion - left;Right flexed knee in stance;Left flexed knee in stance;Trunk flexed;Poor foot clearance - left;Poor foot clearance - right;Narrow base of support    Ambulation Surface Level;Indoor    Gait velocity 27.81 sec =1.18 ft/sec      High Level Balance   High Level Balance Activities Side stepping;Backward walking    High Level Balance Comments Forwards/back walking at counter, 3 reps; sidestepping along counter, R and L, 4 reps (pt starts by taking 8 steps each direction, with cues for bigger steps, able to take 6 steps, then 5 steps).  Forward gait along counter, cues for big steps to decrease number of steps, x 3 reps.  Stagger stance forward/back rocking x 10 reps, wide BOS lateral weightshifting x 10 reps.      Self-Care   Self-Care Other  Self-Care Comments    Other Self-Care Comments  Discussed plan of care and likely discharge next week.  Discussed importance of continuing HEP and walking program (that pt is already doing with family/caregiver) after discharge to maximize gains made in PT.  Discussed fall prevention again with husband present and discussed cues for bigger steps, for improved foot clearance and efficiency with gait.                    PT Education - 07/15/21 0901     Education Details Addition to HEP-see instructions; POC and plans for d/c next visit.    Person(s) Educated Patient;Spouse    Methods Explanation;Demonstration;Handout;Verbal cues    Comprehension Verbalized understanding;Returned demonstration;Verbal cues required              PT Short Term Goals - 06/19/21 0816       PT SHORT TERM GOAL #1   Title Pt will perform HEP with family supervision for improved strength, balance, transfers, and gait.  TARGET 06/20/2021    Time 4    Period Weeks    Status Achieved      PT SHORT TERM GOAL #2   Title Pt will improve 5x sit<>stand to less than or equal to 20 sec to demonstrate improved functional strength and transfer efficiency.    Baseline 25.03 sec; 17.84 sec 06/19/2021    Time 4    Period Weeks    Status Achieved      PT SHORT TERM GOAL #3   Title Pt will verbalize understanding of fall prevention in home environment.    Time 4    Period Weeks    Status Achieved      PT SHORT TERM GOAL #4   Title Pt will improve TUG score to less than or equal to 40 sec for decreased fall risk.    Baseline 51.22 sec; 33.28 sec 06/19/21    Time 8    Period Weeks    Status Achieved               PT Long Term Goals - 05/27/21 0906       PT LONG TERM GOAL #1   Title Pt will perform progression of HEP with family supervision for improved strength, balance, transfers, and gait.  TARGET 07/18/2021    Time 8    Period Weeks    Status New      PT LONG TERM GOAL #2   Title Pt will  improve 5x sit<>stand to less than or equal to 15 sec to demonstrate  improved functional strength and transfer efficiency.    Time 8    Period Weeks    Status New      PT LONG TERM GOAL #3   Title Pt will improve gait velocity to at least 1.3 ft/sefc for improved gait efficiency and safety.    Baseline 0.82 ft/sec    Time 8    Period Weeks    Status New      PT LONG TERM GOAL #4   Title Pt will improve TUG score to less than or equal to 30 sec for decreased fall risk.    Baseline 51.22 sec    Time 8    Period Weeks    Status New      PT LONG TERM GOAL #5   Title Pt will ambulate at least 500 ft, supervision, outdoor surfaces, with appropriat assistive device as needed, for improved community gait.    Time 8    Period Weeks    Status New                   Plan - 07/15/21 0902     Clinical Impression Statement Husband present for session today and he is not interested in using rollator walker with patient, so not used today in session.  PT educated husband in ways to cue pt for increased step length and foot clearance and discussed current walking she is doing with family/caregiver at home.  Pt has met goal for gait velocity and demonstrates good response in therapy session to cues for larger movement patterns.  She is on target towards remaining goals with plans for d/c next visit.    Personal Factors and Comorbidities Comorbidity 3+    Comorbidities PMH:  Alzheimer's, HTN, HLD, depression, anxiety, osteoporosis    Examination-Activity Limitations Locomotion Level;Transfers;Stairs;Stand    Examination-Participation Restrictions Community Activity;Other   Walking in the neighborhood   Stability/Clinical Decision Making Evolving/Moderate complexity    Rehab Potential Good    PT Frequency 2x / week    PT Duration 8 weeks   including eval week   PT Treatment/Interventions ADLs/Self Care Home Management;Gait training;Stair training;Functional mobility training;Therapeutic  activities;Therapeutic exercise;Balance training;Neuromuscular re-education;Patient/family education;Passive range of motion;Manual techniques    PT Next Visit Plan Check remaining goals and plan for d/c next visit.    PT Home Exercise Plan Access Code: [email or web address]    [id number] and Agree with Plan of Care Patient             Patient will benefit from skilled therapeutic intervention in order to improve the following deficits and impairments:  Abnormal gait, Difficulty walking, Decreased balance, Impaired flexibility, Decreased mobility, Decreased strength, Postural dysfunction  Visit Diagnosis: Unsteadiness on feet  Other abnormalities of gait and mobility  Other symptoms and signs involving the nervous system     Problem List Patient Active Problem List   Diagnosis Date Noted   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   Abnormal liver enzymes 09/19/2019   Blurred vision 09/19/2019   Hypercalcemia 03/15/2019   Major depression in full remission ([place]) 11/24/2018   Memory loss 12/24/2016   Encounter for well adult exam with abnormal findings 06/13/2013   Hyperparathyroidism, primary ([place]) 06/12/2013   GERD (gastroesophageal reflux disease) 02/19/2012   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 07/15/2021, 9:05 AM Frazier Butt., PT  [facility] [address] [place], [place], [address] Phone: [phone]   Fax:  [phone]  Name: MARQUESA RATH MRN: [id number] Date of Birth: Dec 10, 1945

## 2021-07-15 NOTE — Telephone Encounter (Signed)
Palliative RN call to check on patient.  Granddaughter [name] reports she is doing well.  They do not need visit, but would like to remain on call list for volunteer and will call us as well if needed

## 2021-07-17 ENCOUNTER — Ambulatory Visit: Payer: Medicare Other | Admitting: Occupational Therapy

## 2021-07-17 ENCOUNTER — Ambulatory Visit: Payer: Medicare Other | Admitting: Physical Therapy

## 2021-07-22 ENCOUNTER — Other Ambulatory Visit: Payer: Self-pay

## 2021-07-22 ENCOUNTER — Encounter: Payer: Self-pay | Admitting: Occupational Therapy

## 2021-07-22 ENCOUNTER — Ambulatory Visit: Payer: Medicare Other | Admitting: Occupational Therapy

## 2021-07-22 DIAGNOSIS — R29818 Other symptoms and signs involving the nervous system: Secondary | ICD-10-CM

## 2021-07-22 DIAGNOSIS — R2689 Other abnormalities of gait and mobility: Secondary | ICD-10-CM | POA: Diagnosis not present

## 2021-07-22 DIAGNOSIS — M6281 Muscle weakness (generalized): Secondary | ICD-10-CM

## 2021-07-22 DIAGNOSIS — R2681 Unsteadiness on feet: Secondary | ICD-10-CM

## 2021-07-22 DIAGNOSIS — R600 Localized edema: Secondary | ICD-10-CM

## 2021-07-22 DIAGNOSIS — R29898 Other symptoms and signs involving the musculoskeletal system: Secondary | ICD-10-CM

## 2021-07-22 DIAGNOSIS — R293 Abnormal posture: Secondary | ICD-10-CM

## 2021-07-22 DIAGNOSIS — R278 Other lack of coordination: Secondary | ICD-10-CM

## 2021-07-22 DIAGNOSIS — R41840 Attention and concentration deficit: Secondary | ICD-10-CM

## 2021-07-22 NOTE — Therapy (Signed)
[facility] [address] [place], [place], [address] Phone: [phone]   Fax:  [phone]  Occupational Therapy Treatment  Patient Details  Name: Brandi Walsh MRN: [id number] Date of Birth: 1946/08/20 No data recorded  Encounter Date: 07/22/2021   OT End of Session - 07/22/21 0814     Visit Number 11    Number of Visits 17    Date for OT Re-Evaluation 09/10/21    Authorization Type UHC MCR    Authorization - Number of Visits 11    Progress Note Due on Visit 20    OT Start Time 0806    OT Stop Time 0845    OT Time Calculation (min) 39 min    Activity Tolerance Patient tolerated treatment well    Behavior During Therapy Flat affect             Past Medical History:  Diagnosis Date   Alzheimer's disease ([facility])    COUGH DUE TO ACE INHIBITORS 11/25/2009   Qualifier: Diagnosis of  By: Everardo All MD, Sean A    Depression    DIABETES MELLITUS, TYPE II 10/13/2007   Qualifier: Diagnosis of  By: Samara Snide     HYPERLIPIDEMIA 10/13/2007   Qualifier: Diagnosis of  By: Samara Snide     HYPERTENSION 10/13/2007   Qualifier: Diagnosis of  By: Samara Snide     MENOPAUSAL SYNDROME 10/13/2007   Qualifier: Diagnosis of  By: Charlsie Quest RMA, Lucy     OSTEOPOROSIS 10/13/2007   Qualifier: Diagnosis of  By: Samara Snide      Past Surgical History:  Procedure Laterality Date   ABDOMINAL HYSTERECTOMY     BREAST BIOPSY  08/2002   TONSILLECTOMY      There were no vitals filed for this visit.   Subjective Assessment - 07/22/21 0814     Subjective  Denies pain.    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 Medical history includes: Alzheimers, HTN, HLD, depression, anxiety, osteoporosis    Patient Stated Goals improvement in Lt hand    Currently in Pain? No/denies              Completing 12-piece puzzle with mod cueing for problem  solving, attention, visual scanning.  Reviewed current coordination HEP (flipping and dealing cards with each hand) with min cueing initially and then incr time and min difficulty.  Began checking progress towards coordination goals (box and blocks test and 9-hole peg test with each hand)--see goals section below.  Ambulating to/from lobby with handheld assist with min-mod cueing for incr step size and for stand>sit to use hands to assist for incr safety.        OT Short Term Goals - 07/15/21 1254       OT SHORT TERM GOAL #1   Title Independent with HEP for BUE ROM/endurance and hand coordination    Time 4    Period Weeks    Status On-going   07/10/21:  min cueing     OT SHORT TERM GOAL #2   Title Pt/family to verbalize understanding with edema management strategies for Lt hand including compression glove    Time 4    Period Weeks    Status Achieved   07/10/21 per pt report     OT SHORT TERM GOAL #3   Title Pt to perform UB dressing/bathing with min assist    Time 4  Period Weeks    Status Achieved   07/10/21:  mod I per pt report     OT SHORT TERM GOAL #4   Title Pt to perform LB dressing/bathing with mod assist    Time 4    Period Weeks    Status Achieved   07/10/21 mod I per pt report (only needs assist for tub/shower transfer)     OT SHORT TERM GOAL #5   Title Pt/family to verbalize understanding with memory strategies and cognitive tips to increase participation in ADLS including some involvement in medication management    Time 4    Period Weeks    Status On-going   07/10/21:  family performs medication management (recommend family continue to assist with this).  07/15/21:  pt/family educated in cognitive/memory strategies and tips to incr participation in ADLs/IADLs              OT Long Term Goals - 07/22/21 4097       OT LONG TERM GOAL #1   Title Independent with updated HEP prn    Time 8    Period Weeks    Status New      OT LONG TERM GOAL #2    Title Pt to improve BUE function as evidenced by improving Box & Blocks score to 20 or greater    Baseline Rt/Lt = 17    Time 8    Period Weeks    Status On-going   07/22/21:  R-19, L-18 blocks     OT LONG TERM GOAL #3   Title Pt to demo sufficient bilateral shoulder ROM to get shirt overhead and wash hair    Time 8    Period Weeks    Status Achieved   07/15/21     OT LONG TERM GOAL #4   Title Pt to improve bilateral hand coordination as evidenced by reducing speed on 9 hole pegt est to 60 sec or under    Baseline Rt = 69.18 sec, Lt = 64.38 sec    Time 8    Period Weeks    Status Achieved   07/22/21:  R-55.34sec, 57.48sec     OT LONG TERM GOAL #5   Title Pt to be overall min assist with BADLS    Time 8    Period Weeks    Status Achieved   07/15/21:  min A per pt/husband     OT LONG TERM GOAL #6   Title Pt to perform simple dyanmic standing task (brushing teeth, folding laundry) for 10 min. w/o LOB using one hand countertop support prn    Time 8    Period Weeks    Status On-going                   Plan - 07/22/21 0815     Clinical Impression Statement Pt progressing slowly towards goals.  Cognitive deficits affect carryover as pt needs min cueing for initiation/set-up, problem solving.    OT Occupational Profile and History Detailed Assessment- Review of Records and additional review of physical, cognitive, psychosocial history related to current functional performance    Occupational performance deficits (Please refer to evaluation for details): ADL's;IADL's;Social Participation    Body Structure / Function / Physical Skills ADL;Decreased knowledge of use of DME;Strength;Balance;Dexterity;GMC;Tone;Edema;Body mechanics;UE functional use;IADL;ROM;Sensation;Mobility;Flexibility;Coordination;Decreased knowledge of precautions;FMC    Cognitive Skills Energy/Drive;Memory;Problem Solve;Safety Awareness    Rehab Potential Good    Clinical Decision Making Several treatment  options, min-mod task modification necessary  Comorbidities Affecting Occupational Performance: Presence of comorbidities impacting occupational performance    Comorbidities impacting occupational performance description: Alzheimers, osteoporosis    Modification or Assistance to Complete Evaluation  Min-Moderate modification of tasks or assist with assess necessary to complete eval    OT Frequency 2x / week    OT Duration 8 weeks   or 16 visits over 3 months (d/t scheduling & transporation conflicts) plus eval.   OT Treatment/Interventions Self-care/ADL training;DME and/or AE instruction;Splinting;Compression bandaging;Therapeutic activities;Ultrasound;Therapeutic exercise;Cognitive remediation/compensation;Coping strategies training;Neuromuscular education;Functional Mobility Training;Passive range of motion;Visual/perceptual remediation/compensation;Manual Therapy;Patient/family education    Plan standing functional reaching/IADL, closed-chain shoulder ROM    Consulted and Agree with Plan of Care Patient             Patient will benefit from skilled therapeutic intervention in order to improve the following deficits and impairments:   Body Structure / Function / Physical Skills: ADL, Decreased knowledge of use of DME, Strength, Balance, Dexterity, GMC, Tone, Edema, Body mechanics, UE functional use, IADL, ROM, Sensation, Mobility, Flexibility, Coordination, Decreased knowledge of precautions, FMC Cognitive Skills: Energy/Drive, Memory, Problem Solve, Safety Awareness     Visit Diagnosis: Other symptoms and signs involving the nervous system  Other symptoms and signs involving the musculoskeletal system  Other lack of coordination  Attention and concentration deficit  Localized edema  Other abnormalities of gait and mobility  Unsteadiness on feet  Abnormal posture  Muscle weakness (generalized)    Problem List Patient Active Problem List   Diagnosis Date Noted    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   Abnormal liver enzymes 09/19/2019   Blurred vision 09/19/2019   Hypercalcemia 03/15/2019   Major depression in full remission ([facility]) 11/24/2018   Memory loss 12/24/2016   Encounter for well adult exam with abnormal findings 06/13/2013   Hyperparathyroidism, primary ([facility]) 06/12/2013   GERD (gastroesophageal reflux disease) 02/19/2012   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 [place], [place], [address] Phone: [phone]   Fax:  [phone]  Name: KINZLIE HARNEY MRN: [id number] Date of Birth: Jan 13, 1946  Willa Frater, OTR/L [facility] [address] [place], [place]  [address] [phone] phone [phone] 07/22/21 8:39 AM

## 2021-07-23 ENCOUNTER — Encounter: Payer: Self-pay | Admitting: Family

## 2021-07-24 ENCOUNTER — Encounter: Payer: Self-pay | Admitting: Occupational Therapy

## 2021-07-24 ENCOUNTER — Ambulatory Visit: Payer: Medicare Other | Admitting: Occupational Therapy

## 2021-07-24 ENCOUNTER — Other Ambulatory Visit: Payer: Self-pay

## 2021-07-24 DIAGNOSIS — R293 Abnormal posture: Secondary | ICD-10-CM

## 2021-07-24 DIAGNOSIS — R600 Localized edema: Secondary | ICD-10-CM

## 2021-07-24 DIAGNOSIS — R41840 Attention and concentration deficit: Secondary | ICD-10-CM

## 2021-07-24 DIAGNOSIS — R29898 Other symptoms and signs involving the musculoskeletal system: Secondary | ICD-10-CM

## 2021-07-24 DIAGNOSIS — R29818 Other symptoms and signs involving the nervous system: Secondary | ICD-10-CM

## 2021-07-24 DIAGNOSIS — R2681 Unsteadiness on feet: Secondary | ICD-10-CM

## 2021-07-24 DIAGNOSIS — R2689 Other abnormalities of gait and mobility: Secondary | ICD-10-CM

## 2021-07-24 DIAGNOSIS — R278 Other lack of coordination: Secondary | ICD-10-CM

## 2021-07-24 NOTE — Therapy (Signed)
[facility] [address] [place], [place], [address] Phone: [phone]   Fax:  [phone]  Occupational Therapy Treatment  Patient Details  Name: Brandi Walsh MRN: [id number] Date of Birth: June 15, 1946 No data recorded  Encounter Date: 07/24/2021   OT End of Session - 07/24/21 0809     Visit Number 12    Number of Visits 17    Date for OT Re-Evaluation 09/10/21    Authorization Type [employer] MCR    Authorization - Number of Visits 12    Progress Note Due on Visit 20    OT Start Time 0804    OT Stop Time 0845    OT Time Calculation (min) 41 min    Activity Tolerance Patient tolerated treatment well    Behavior During Therapy Flat affect             Past Medical History:  Diagnosis Date   Alzheimer's disease ([place])    COUGH DUE TO ACE INHIBITORS 11/25/2009   Qualifier: Diagnosis of  By: [name] MD, Sean A    Depression    DIABETES MELLITUS, TYPE II 10/13/2007   Qualifier: Diagnosis of  By: Larose Kells     HYPERLIPIDEMIA 10/13/2007   Qualifier: Diagnosis of  By: Larose Kells     HYPERTENSION 10/13/2007   Qualifier: Diagnosis of  By: Larose Kells     MENOPAUSAL SYNDROME 10/13/2007   Qualifier: Diagnosis of  By: Marca Ancona RMA, Lucy     OSTEOPOROSIS 10/13/2007   Qualifier: Diagnosis of  By: Larose Kells      Past Surgical History:  Procedure Laterality Date   ABDOMINAL HYSTERECTOMY     BREAST BIOPSY  08/2002   TONSILLECTOMY      There were no vitals filed for this visit.   Subjective Assessment - 07/24/21 0808     Subjective  Denies pain.    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 Medical history includes: Alzheimers, HTN, HLD, depression, anxiety, osteoporosis    Patient Stated Goals improvement in Lt hand    Currently in Pain? No/denies                Picking up and stacking coins with each hand with min  difficulty/incr time.  Manipulating coins in hand to place in coin bank with mod difficulty with each hand for incr coordination.  Standing, folding clothes with close supervision for incr activity tolerance and IADL participation  Stood for 11-72mn  without rest  Arm bike x557m level 1 for reciprocal movement and conditioning without rest, min v.c.  Walking with hand-held assist and min cueing for bigger steps within gym and to/from lobby.    Sitting, closed-chain with BUEs with ball for shoulder flex, reach for floor, and trunk rotations with min cues for position/posture.         OT Short Term Goals - 07/24/21 [id number]     OT SHORT TERM GOAL #1   Title Independent with HEP for BUE ROM/endurance and hand coordination    Time 4    Period Weeks    Status Partially Met   07/10/21:  min cueing.  07/24/21:  will need set-up and min cueing from caregiver     OT [place]   Title Pt/family to verbalize understanding with edema management strategies for Lt hand including compression glove    Time 4  Period Weeks    Status Achieved   07/10/21 per pt report     OT SHORT TERM GOAL #3   Title Pt to perform UB dressing/bathing with min assist    Time 4    Period Weeks    Status Achieved   07/10/21:  mod I per pt report     OT SHORT TERM GOAL #4   Title Pt to perform LB dressing/bathing with mod assist    Time 4    Period Weeks    Status Achieved   07/10/21 mod I per pt report (only needs assist for tub/shower transfer)     OT SHORT TERM GOAL #5   Title Pt/family to verbalize understanding with memory strategies and cognitive tips to increase participation in [place] including some involvement in medication management    Time 4    Period Weeks    Status On-going   07/10/21:  family performs medication management (recommend family continue to assist with this).  07/15/21:  pt/family educated in cognitive/memory strategies and tips to incr participation in [place] - 07/22/21 1540       OT LONG TERM GOAL #1   Title Independent with updated HEP prn    Time 8    Period Weeks    Status New      OT LONG TERM GOAL #2   Title Pt to improve BUE function as evidenced by improving Box & Blocks score to 20 or greater    Baseline Rt/Lt = 17    Time 8    Period Weeks    Status On-going   07/22/21:  R-19, L-18 blocks     OT LONG TERM GOAL #3   Title Pt to demo sufficient bilateral shoulder ROM to get shirt overhead and wash hair    Time 8    Period Weeks    Status Achieved   07/15/21     OT LONG TERM GOAL #4   Title Pt to improve bilateral hand coordination as evidenced by reducing speed on 9 hole pegt est to 60 sec or under    Baseline Rt = 69.18 sec, Lt = 64.38 sec    Time 8    Period Weeks    Status Achieved   07/22/21:  R-55.34sec, 57.48sec     OT LONG TERM GOAL #5   Title Pt to be overall min assist with BADLS    Time 8    Period Weeks    Status Achieved   07/15/21:  min A per pt/husband     OT LONG TERM GOAL #6   Title Pt to perform simple dyanmic standing task (brushing teeth, folding laundry) for 10 min. w/o LOB using one hand countertop support prn    Time 8    Period Weeks    Status On-going                   Plan - 07/24/21 0809     Clinical Impression Statement Pt progressing slowly towards goals with improving standing tolerance.  Pt continues to need cueing for initiation/set-up, problem solving for functional tasks due to cognitive deficits.    OT Occupational Profile and History Detailed Assessment- Review of Records and additional review of physical, cognitive, psychosocial history related to current functional performance    Occupational performance deficits (Please refer to evaluation for details): ADL's;IADL's;Social Participation    Body Structure /  Function / Physical Skills ADL;Decreased knowledge of use of DME;Strength;Balance;Dexterity;GMC;Tone;Edema;Body mechanics;UE functional  use;IADL;ROM;Sensation;Mobility;Flexibility;Coordination;Decreased knowledge of precautions;FMC    Cognitive Skills Energy/Drive;Memory;Problem Solve;Safety Awareness    Rehab Potential Good    Clinical Decision Making Several treatment options, min-mod task modification necessary    Comorbidities Affecting Occupational Performance: Presence of comorbidities impacting occupational performance    Comorbidities impacting occupational performance description: Alzheimers, osteoporosis    Modification or Assistance to Complete Evaluation  Min-Moderate modification of tasks or assist with assess necessary to complete eval    OT Frequency 2x / week    OT Duration 8 weeks   or 16 visits over 3 months (d/t scheduling & transporation conflicts) plus eval.   OT Treatment/Interventions Self-care/ADL training;DME and/or AE instruction;Splinting;Compression bandaging;Therapeutic activities;Ultrasound;Therapeutic exercise;Cognitive remediation/compensation;Coping strategies training;Neuromuscular education;Functional Mobility Training;Passive range of motion;Visual/perceptual remediation/compensation;Manual Therapy;Patient/family education    Plan standing functional reaching/simple IADL, ?add to coordination HEP, address remaining goals    Consulted and Agree with Plan of Care Patient             Patient will benefit from skilled therapeutic intervention in order to improve the following deficits and impairments:   Body Structure / Function / Physical Skills: ADL, Decreased knowledge of use of DME, Strength, Balance, Dexterity, GMC, Tone, Edema, Body mechanics, UE functional use, IADL, ROM, Sensation, Mobility, Flexibility, Coordination, Decreased knowledge of precautions, [place] Cognitive Skills: Energy/Drive, Memory, Problem Solve, Safety Awareness     Visit Diagnosis: Other symptoms and signs involving the nervous system  Other symptoms and signs involving the musculoskeletal system  Other lack of  coordination  Attention and concentration deficit  Localized edema  Other abnormalities of gait and mobility  Abnormal posture  Unsteadiness on feet    Problem List Patient Active Problem List   Diagnosis Date Noted   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   Abnormal liver enzymes 09/19/2019   Blurred vision 09/19/2019   Hypercalcemia 03/15/2019   Major depression in full remission ([place]) 11/24/2018   Memory loss 12/24/2016   Encounter for well adult exam with abnormal findings 06/13/2013   Hyperparathyroidism, primary ([place]) 06/12/2013   GERD (gastroesophageal reflux disease) 02/19/2012   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 [place], [place], [address] Phone: [phone]   Fax:  [phone]  Name: Brandi Walsh MRN: [id number] Date of Birth: [birth year]  Vianne Bulls, OTR/L [facility] [address] [place], [place]  [address] [phone] phone [phone] 07/24/21 9:08 AM

## 2021-07-29 ENCOUNTER — Other Ambulatory Visit: Payer: Self-pay

## 2021-07-29 ENCOUNTER — Ambulatory Visit: Payer: Medicare Other | Admitting: Occupational Therapy

## 2021-07-29 ENCOUNTER — Encounter: Payer: Self-pay | Admitting: Occupational Therapy

## 2021-07-29 DIAGNOSIS — R2689 Other abnormalities of gait and mobility: Secondary | ICD-10-CM | POA: Diagnosis not present

## 2021-07-29 DIAGNOSIS — R600 Localized edema: Secondary | ICD-10-CM

## 2021-07-29 DIAGNOSIS — R278 Other lack of coordination: Secondary | ICD-10-CM

## 2021-07-29 DIAGNOSIS — R29818 Other symptoms and signs involving the nervous system: Secondary | ICD-10-CM

## 2021-07-29 DIAGNOSIS — R293 Abnormal posture: Secondary | ICD-10-CM

## 2021-07-29 DIAGNOSIS — R29898 Other symptoms and signs involving the musculoskeletal system: Secondary | ICD-10-CM

## 2021-07-29 DIAGNOSIS — R2681 Unsteadiness on feet: Secondary | ICD-10-CM

## 2021-07-29 DIAGNOSIS — R41840 Attention and concentration deficit: Secondary | ICD-10-CM

## 2021-07-29 NOTE — Therapy (Signed)
[facility] [address] [place], [place], [address] Phone: [phone]   Fax:  [phone]  Occupational Therapy Treatment  Patient Details  Name: Brandi Walsh MRN: [id number] Date of Birth: 1946-02-15 No data recorded  Encounter Date: 07/29/2021   OT End of Session - 07/29/21 0807     Visit Number 13    Number of Visits 17    Date for OT Re-Evaluation 09/10/21    Authorization Type UHC MCR    Authorization - Number of Visits 13    Progress Note Due on Visit 20    OT Start Time 0804    OT Stop Time 0845    OT Time Calculation (min) 41 min    Activity Tolerance Patient tolerated treatment well    Behavior During Therapy Flat affect             Past Medical History:  Diagnosis Date   Alzheimer's disease ([place])    COUGH DUE TO ACE INHIBITORS 11/25/2009   Qualifier: Diagnosis of  By: [name] MD, Sean A    Depression    DIABETES MELLITUS, TYPE II 10/13/2007   Qualifier: Diagnosis of  By: Larose Kells     HYPERLIPIDEMIA 10/13/2007   Qualifier: Diagnosis of  By: Larose Kells     HYPERTENSION 10/13/2007   Qualifier: Diagnosis of  By: Larose Kells     MENOPAUSAL SYNDROME 10/13/2007   Qualifier: Diagnosis of  By: Marca Ancona RMA, Lucy     OSTEOPOROSIS 10/13/2007   Qualifier: Diagnosis of  By: Larose Kells      Past Surgical History:  Procedure Laterality Date   ABDOMINAL HYSTERECTOMY     BREAST BIOPSY  08/2002   TONSILLECTOMY      There were no vitals filed for this visit.   Subjective Assessment - 07/29/21 0807     Subjective  Denies pain.    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 Medical history includes: Alzheimers, HTN, HLD, depression, anxiety, osteoporosis    Patient Stated Goals improvement in Lt hand    Currently in Pain? No/denies               Completing alphabet puzzle for visual scanning and  cognitive skills with occasional min cueing for problem solving and incr time needed.  Standing and performing functional reaching with each UE to place clothespins with 1-8lb resistance on vertical pole for incr standing tolerance, reach, and strength with close supervision.  Able to stand for approx 17mn.  Pt needed to go to the restroom so therapist assisted pt in toileting.  Pt needed CGA for functional ambulation in clinic and min-mod A for clothing management due to urgency and difficulty.  Pt was able to perform toilet transfer and hygiene independently with incr time.  Functional ambulation with CGA/handheld assist and min-mod cueing for incr step size/base of support         OT Short Term Goals - 07/24/21 00370      OT SHORT TERM GOAL #1   Title Independent with HEP for BUE ROM/endurance and hand coordination    Time 4    Period Weeks    Status Partially Met   07/10/21:  min cueing.  07/24/21:  will need set-up and min cueing from caregiver     OT [place]   Title Pt/family to verbalize understanding with edema management strategies for  Lt hand including compression glove    Time 4    Period Weeks    Status Achieved   07/10/21 per pt report     OT SHORT TERM GOAL #3   Title Pt to perform UB dressing/bathing with min assist    Time 4    Period Weeks    Status Achieved   07/10/21:  mod I per pt report     OT SHORT TERM GOAL #4   Title Pt to perform LB dressing/bathing with mod assist    Time 4    Period Weeks    Status Achieved   07/10/21 mod I per pt report (only needs assist for tub/shower transfer)     OT SHORT TERM GOAL #5   Title Pt/family to verbalize understanding with memory strategies and cognitive tips to increase participation in [place] including some involvement in medication management    Time 4    Period Weeks    Status On-going   07/10/21:  family performs medication management (recommend family continue to assist with this).  07/15/21:  pt/family  educated in cognitive/memory strategies and tips to incr participation in ADLs/IADLs              OT Long Term Goals - 07/29/21 0812       OT LONG TERM GOAL #1   Title Independent with updated HEP prn    Time 8    Period Weeks    Status Partially Met   07/29/21:  pt able to perform (cognitive activities) with cueing for initiation/set-up     OT LONG TERM GOAL #2   Title Pt to improve BUE function as evidenced by improving Box & Blocks score to 20 or greater    Baseline Rt/Lt = 17    Time 8    Period Weeks    Status On-going   07/22/21:  R-19, L-18 blocks     OT LONG TERM GOAL #3   Title Pt to demo sufficient bilateral shoulder ROM to get shirt overhead and wash hair    Time 8    Period Weeks    Status Achieved   07/15/21     OT LONG TERM GOAL #4   Title Pt to improve bilateral hand coordination as evidenced by reducing speed on 9 hole pegt est to 60 sec or under    Baseline Rt = 69.18 sec, Lt = 64.38 sec    Time 8    Period Weeks    Status Achieved   07/22/21:  R-55.34sec, 57.48sec     OT LONG TERM GOAL #5   Title Pt to be overall min assist with BADLS    Time 8    Period Weeks    Status Achieved   07/15/21:  min A per pt/husband     OT LONG TERM GOAL #6   Title Pt to perform simple dyanmic standing task (brushing teeth, folding laundry) for 10 min. w/o LOB using one hand countertop support prn    Time 8    Period Weeks    Status On-going                   Plan - 07/29/21 2542     Clinical Impression Statement Pt progressing towards goals with improving standing tolerance.  Pt continues to need cueing for initiation/set-up, and occasional problem solving for functional tasks due to cognitive deficits, but is able to participate in various activities.    OT Occupational Profile and History Detailed  Assessment- Review of Records and additional review of physical, cognitive, psychosocial history related to current functional performance    Occupational  performance deficits (Please refer to evaluation for details): ADL's;IADL's;Social Participation    Body Structure / Function / Physical Skills ADL;Decreased knowledge of use of DME;Strength;Balance;Dexterity;GMC;Tone;Edema;Body mechanics;UE functional use;IADL;ROM;Sensation;Mobility;Flexibility;Coordination;Decreased knowledge of precautions;FMC    Cognitive Skills Energy/Drive;Memory;Problem Solve;Safety Awareness    Rehab Potential Good    Clinical Decision Making Several treatment options, min-mod task modification necessary    Comorbidities Affecting Occupational Performance: Presence of comorbidities impacting occupational performance    Comorbidities impacting occupational performance description: Alzheimers, osteoporosis    Modification or Assistance to Complete Evaluation  Min-Moderate modification of tasks or assist with assess necessary to complete eval    OT Frequency 2x / week    OT Duration 8 weeks   or 16 visits over 3 months (d/t scheduling & transporation conflicts) plus eval.   OT Treatment/Interventions Self-care/ADL training;DME and/or AE instruction;Splinting;Compression bandaging;Therapeutic activities;Ultrasound;Therapeutic exercise;Cognitive remediation/compensation;Coping strategies training;Neuromuscular education;Functional Mobility Training;Passive range of motion;Visual/perceptual remediation/compensation;Manual Therapy;Patient/family education    Plan standing functional reaching/simple IADL, address remaining goals    Consulted and Agree with Plan of Care Patient             Patient will benefit from skilled therapeutic intervention in order to improve the following deficits and impairments:   Body Structure / Function / Physical Skills: ADL, Decreased knowledge of use of DME, Strength, Balance, Dexterity, GMC, Tone, Edema, Body mechanics, UE functional use, IADL, ROM, Sensation, Mobility, Flexibility, Coordination, Decreased knowledge of precautions,  [place] Cognitive Skills: Energy/Drive, Memory, Problem Solve, Safety Awareness     Visit Diagnosis: Other symptoms and signs involving the nervous system  Other symptoms and signs involving the musculoskeletal system  Other lack of coordination  Attention and concentration deficit  Localized edema  Other abnormalities of gait and mobility  Abnormal posture  Unsteadiness on feet    Problem List Patient Active Problem List   Diagnosis Date Noted   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   Abnormal liver enzymes 09/19/2019   Blurred vision 09/19/2019   Hypercalcemia 03/15/2019   Major depression in full remission ([place]) 11/24/2018   Memory loss 12/24/2016   Encounter for well adult exam with abnormal findings 06/13/2013   Hyperparathyroidism, primary ([place]) 06/12/2013   GERD (gastroesophageal reflux disease) 02/19/2012   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   COUGH DUE TO ACE INHIBITORS 11/25/2009   Diabetes ([place]) 10/13/2007   Dyslipidemia 10/13/2007   Essential hypertension 10/13/2007   MENOPAUSAL SYNDROME 10/13/2007   Osteoporosis 10/13/2007    [facility] 07/29/2021, 10:57 AM  [facility] [address] [place], [place], [address] Phone: [phone]   Fax:  [phone]  Name: Brandi Walsh MRN: [id number] Date of Birth: February 06, 1946  Vianne Bulls, OTR/L [facility] [address] [place], [place]  [address] [phone] phone [phone] 07/29/21 10:57 AM

## 2021-07-31 ENCOUNTER — Other Ambulatory Visit: Payer: Self-pay

## 2021-07-31 ENCOUNTER — Ambulatory Visit: Payer: Medicare Other | Attending: Neurology | Admitting: Occupational Therapy

## 2021-07-31 DIAGNOSIS — R2681 Unsteadiness on feet: Secondary | ICD-10-CM | POA: Insufficient documentation

## 2021-07-31 DIAGNOSIS — R41840 Attention and concentration deficit: Secondary | ICD-10-CM | POA: Diagnosis present

## 2021-07-31 DIAGNOSIS — R293 Abnormal posture: Secondary | ICD-10-CM | POA: Diagnosis present

## 2021-07-31 DIAGNOSIS — R29898 Other symptoms and signs involving the musculoskeletal system: Secondary | ICD-10-CM | POA: Diagnosis present

## 2021-07-31 DIAGNOSIS — R278 Other lack of coordination: Secondary | ICD-10-CM | POA: Diagnosis present

## 2021-07-31 DIAGNOSIS — R2689 Other abnormalities of gait and mobility: Secondary | ICD-10-CM | POA: Insufficient documentation

## 2021-07-31 DIAGNOSIS — R29818 Other symptoms and signs involving the nervous system: Secondary | ICD-10-CM | POA: Insufficient documentation

## 2021-07-31 DIAGNOSIS — R600 Localized edema: Secondary | ICD-10-CM | POA: Diagnosis present

## 2021-07-31 NOTE — Therapy (Signed)
[facility] [address] [place], [place], [address] Phone: [phone]   Fax:  [phone]  Occupational Therapy Treatment  Patient Details  Name: Brandi Walsh MRN: [id number] Date of Birth: 25-Jun-1946 No data recorded  Encounter Date: 07/31/2021   OT End of Session - 07/31/21 0809     Visit Number 14    Number of Visits 17    Date for OT Re-Evaluation 09/10/21    Authorization Type UHC MCR    Authorization - Number of Visits 14    Progress Note Due on Visit 20    OT Start Time 0806    OT Stop Time 0845    OT Time Calculation (min) 39 min    Activity Tolerance Patient tolerated treatment well    Behavior During Therapy Flat affect             Past Medical History:  Diagnosis Date   Alzheimer's disease ([place])    COUGH DUE TO ACE INHIBITORS 11/25/2009   Qualifier: Diagnosis of  By: [name] MD, Sean A    Depression    DIABETES MELLITUS, TYPE II 10/13/2007   Qualifier: Diagnosis of  By: Larose Kells     HYPERLIPIDEMIA 10/13/2007   Qualifier: Diagnosis of  By: Larose Kells     HYPERTENSION 10/13/2007   Qualifier: Diagnosis of  By: Larose Kells     MENOPAUSAL SYNDROME 10/13/2007   Qualifier: Diagnosis of  By: Marca Ancona RMA, Lucy     OSTEOPOROSIS 10/13/2007   Qualifier: Diagnosis of  By: Larose Kells      Past Surgical History:  Procedure Laterality Date   ABDOMINAL HYSTERECTOMY     BREAST BIOPSY  08/2002   TONSILLECTOMY      There were no vitals filed for this visit.   Subjective Assessment - 07/31/21 0809     Subjective  Denies pain.    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 Medical history includes: Alzheimers, HTN, HLD, depression, anxiety, osteoporosis    Patient Stated Goals improvement in Lt hand    Currently in Pain? No/denies             Sitting, Closed-chain shoulder flex, abduction, and chest  press with scapular retraction with min cues for positioning/to avoid compensation.  Then, horizontal abduction/adduction with trunk/head rotation.  Bag exercises for Simulated ADLs with focus/min-mod cues for large amplitude movements:  Donning/doffing pull-over shirt (behind head), passing bag from one hand to the other in front and in back for pulling down shirt/donning bra/donning jacket.  Arm bike x51mn level 1 for reciprocal movement/conditioning without rest.  Placing Perfection Pieces in board for visual scanning, problem-solving, attention, and coordination/in-hand manipulation with each hand with min difficulty, occasional min v.c. for problem solving, and incr time.    Placing grooved pegs in pegboard with each hand with min-mod difficulty for incr coordination, particularly with L hand.    Functional ambulation with CGA/handheld assist and min-mod cueing for incr step size/base of support        OT Short Term Goals - 07/24/21 01937      OT SHORT TERM GOAL #1   Title Independent with HEP for BUE ROM/endurance and hand coordination    Time 4    Period Weeks    Status Partially Met   07/10/21:  min cueing.  07/24/21:  will need set-up and min cueing from caregiver  OT SHORT TERM GOAL #2   Title Pt/family to verbalize understanding with edema management strategies for Lt hand including compression glove    Time 4    Period Weeks    Status Achieved   07/10/21 per pt report     OT SHORT TERM GOAL #3   Title Pt to perform UB dressing/bathing with min assist    Time 4    Period Weeks    Status Achieved   07/10/21:  mod I per pt report     OT SHORT TERM GOAL #4   Title Pt to perform LB dressing/bathing with mod assist    Time 4    Period Weeks    Status Achieved   07/10/21 mod I per pt report (only needs assist for tub/shower transfer)     OT SHORT TERM GOAL #5   Title Pt/family to verbalize understanding with memory strategies and cognitive tips to increase  participation in ADLS including some involvement in medication management    Time 4    Period Weeks    Status On-going   07/10/21:  family performs medication management (recommend family continue to assist with this).  07/15/21:  pt/family educated in cognitive/memory strategies and tips to incr participation in ADLs/IADLs              OT Long Term Goals - 07/29/21 0812       OT LONG TERM GOAL #1   Title Independent with updated HEP prn    Time 8    Period Weeks    Status Partially Met   07/29/21:  pt able to perform (cognitive activities) with cueing for initiation/set-up     OT LONG TERM GOAL #2   Title Pt to improve BUE function as evidenced by improving Box & Blocks score to 20 or greater    Baseline Rt/Lt = 17    Time 8    Period Weeks    Status On-going   07/22/21:  R-19, L-18 blocks     OT LONG TERM GOAL #3   Title Pt to demo sufficient bilateral shoulder ROM to get shirt overhead and wash hair    Time 8    Period Weeks    Status Achieved   07/15/21     OT LONG TERM GOAL #4   Title Pt to improve bilateral hand coordination as evidenced by reducing speed on 9 hole pegt est to 60 sec or under    Baseline Rt = 69.18 sec, Lt = 64.38 sec    Time 8    Period Weeks    Status Achieved   07/22/21:  R-55.34sec, 57.48sec     OT LONG TERM GOAL #5   Title Pt to be overall min assist with BADLS    Time 8    Period Weeks    Status Achieved   07/15/21:  min A per pt/husband     OT LONG TERM GOAL #6   Title Pt to perform simple dyanmic standing task (brushing teeth, folding laundry) for 10 min. w/o LOB using one hand countertop support prn    Time 8    Period Weeks    Status On-going                   Plan - 07/31/21 0810     Clinical Impression Statement Pt progressing towards goals with incr activity tolerance, coordination, and functional reach.  Pt continues to need cueing for initiation/set-up, and occasional problem solving for functional tasks   due to cognitive  deficits, but is able to participate in various activities.    OT Occupational Profile and History Detailed Assessment- Review of Records and additional review of physical, cognitive, psychosocial history related to current functional performance    Occupational performance deficits (Please refer to evaluation for details): ADL's;IADL's;Social Participation    Body Structure / Function / Physical Skills ADL;Decreased knowledge of use of DME;Strength;Balance;Dexterity;GMC;Tone;Edema;Body mechanics;UE functional use;IADL;ROM;Sensation;Mobility;Flexibility;Coordination;Decreased knowledge of precautions;FMC    Cognitive Skills Energy/Drive;Memory;Problem Solve;Safety Awareness    Rehab Potential Good    Clinical Decision Making Several treatment options, min-mod task modification necessary    Comorbidities Affecting Occupational Performance: Presence of comorbidities impacting occupational performance    Comorbidities impacting occupational performance description: Alzheimers, osteoporosis    Modification or Assistance to Complete Evaluation  Min-Moderate modification of tasks or assist with assess necessary to complete eval    OT Frequency 2x / week    OT Duration 8 weeks   or 16 visits over 3 months (d/t scheduling & transporation conflicts) plus eval.   OT Treatment/Interventions Self-care/ADL training;DME and/or AE instruction;Splinting;Compression bandaging;Therapeutic activities;Ultrasound;Therapeutic exercise;Cognitive remediation/compensation;Coping strategies training;Neuromuscular education;Functional Mobility Training;Passive range of motion;Visual/perceptual remediation/compensation;Manual Therapy;Patient/family education    Plan check remaing goals and d/c OT    Consulted and Agree with Plan of Care Patient             Patient will benefit from skilled therapeutic intervention in order to improve the following deficits and impairments:   Body Structure / Function / Physical Skills:  ADL, Decreased knowledge of use of DME, Strength, Balance, Dexterity, GMC, Tone, Edema, Body mechanics, UE functional use, IADL, ROM, Sensation, Mobility, Flexibility, Coordination, Decreased knowledge of precautions, FMC Cognitive Skills: Energy/Drive, Memory, Problem Solve, Safety Awareness     Visit Diagnosis: Other symptoms and signs involving the nervous system  Other symptoms and signs involving the musculoskeletal system  Other lack of coordination  Attention and concentration deficit  Localized edema  Other abnormalities of gait and mobility  Unsteadiness on feet  Abnormal posture    Problem List Patient Active Problem List   Diagnosis Date Noted   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   Abnormal liver enzymes 09/19/2019   Blurred vision 09/19/2019   Hypercalcemia 03/15/2019   Major depression in full remission (HCC) 11/24/2018   Memory loss 12/24/2016   Encounter for well adult exam with abnormal findings 06/13/2013   Hyperparathyroidism, primary (HCC) 06/12/2013   GERD (gastroesophageal reflux disease) 02/19/2012   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   COUGH DUE TO ACE INHIBITORS 11/25/2009   Diabetes (HCC) 10/13/2007   Dyslipidemia 10/13/2007   Essential hypertension 10/13/2007   MENOPAUSAL SYNDROME 10/13/2007   Osteoporosis 10/13/2007    , 07/31/2021, 8:46 AM  [facility] [address] [place], [place], [address] Phone: [phone]   Fax:  [phone]  Name: Brandi Walsh MRN: [id number] Date of Birth: 10/09/1946   , OTR/L [facility] [address] [place], [place]  [address] [phone] phone [phone] 07/31/21 8:46 AM    

## 2021-08-05 ENCOUNTER — Ambulatory Visit: Payer: Medicare Other | Admitting: Occupational Therapy

## 2021-08-07 ENCOUNTER — Encounter: Payer: Medicare Other | Admitting: Occupational Therapy

## 2021-08-12 ENCOUNTER — Other Ambulatory Visit: Payer: Self-pay

## 2021-08-12 ENCOUNTER — Ambulatory Visit: Payer: Medicare Other | Admitting: Occupational Therapy

## 2021-08-12 DIAGNOSIS — R29898 Other symptoms and signs involving the musculoskeletal system: Secondary | ICD-10-CM

## 2021-08-12 DIAGNOSIS — R29818 Other symptoms and signs involving the nervous system: Secondary | ICD-10-CM

## 2021-08-12 NOTE — Therapy (Signed)
[facility] [address] [place], [place], [address] Phone: [phone]   Fax:  [phone]  Occupational Therapy Treatment  Patient Details  Name: Brandi Walsh MRN: [id number] Date of Birth: 1946-06-13 No data recorded  Encounter Date: 08/12/2021   OT End of Session - 08/12/21 0913     Visit Number 15    Number of Visits 17    Date for OT Re-Evaluation 09/10/21    Authorization Type UHC MCR    Authorization - Number of Visits 15    Progress Note Due on Visit 20    OT Start Time 0800    OT Stop Time 0835    OT Time Calculation (min) 35 min    Activity Tolerance Patient tolerated treatment well    Behavior During Therapy Flat affect             Past Medical History:  Diagnosis Date   Alzheimer's disease ([place])    COUGH DUE TO ACE INHIBITORS 11/25/2009   Qualifier: Diagnosis of  By: [name] MD, Sean A    Depression    DIABETES MELLITUS, TYPE II 10/13/2007   Qualifier: Diagnosis of  By: Larose Kells     HYPERLIPIDEMIA 10/13/2007   Qualifier: Diagnosis of  By: Larose Kells     HYPERTENSION 10/13/2007   Qualifier: Diagnosis of  By: Larose Kells     MENOPAUSAL SYNDROME 10/13/2007   Qualifier: Diagnosis of  By: Marca Ancona RMA, Lucy     OSTEOPOROSIS 10/13/2007   Qualifier: Diagnosis of  By: Larose Kells      Past Surgical History:  Procedure Laterality Date   ABDOMINAL HYSTERECTOMY     BREAST BIOPSY  08/2002   TONSILLECTOMY      There were no vitals filed for this visit.   Subjective Assessment - 08/12/21 0804     Subjective  Denies pain. 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 Medical history includes: Alzheimers, HTN, HLD, depression, anxiety, osteoporosis    Patient Stated Goals improvement in Lt hand    Currently in Pain? No/denies             Assessed remaining goals - see below  for details.   Pt stood for 15 minutes w/o rest to fold laundry and remove clothespins from antenna alternating UE's/hands.   Pt encouraged to continue participation in all ADLS where safe and continue to stand for simple safe activities when able to prevent sedentary lifestyle                       OT Short Term Goals - 08/12/21 0831       OT SHORT TERM GOAL #1   Title Independent with HEP for BUE ROM/endurance and hand coordination    Time 4    Period Weeks    Status Partially Met   07/10/21:  min cueing.  07/24/21:  will need set-up and min cueing from caregiver     OT [place] #2   Title Pt/family to verbalize understanding with edema management strategies for Lt hand including compression glove    Time 4    Period Weeks    Status Achieved   07/10/21 per pt report     OT SHORT TERM GOAL #3   Title Pt to perform UB dressing/bathing with min assist  Time 4    Period Weeks    Status Achieved   07/10/21:  mod I per pt report     OT SHORT TERM GOAL #4   Title Pt to perform LB dressing/bathing with mod assist    Time 4    Period Weeks    Status Achieved   07/10/21 mod I per pt report (only needs assist for tub/shower transfer)     OT SHORT TERM GOAL #5   Title Pt/family to verbalize understanding with memory strategies and cognitive tips to increase participation in [place] including some involvement in medication management    Time 4    Period Weeks    Status Achieved   07/10/21:  family performs medication management (recommend family continue to assist with this).  07/15/21:  pt/family educated in cognitive/memory strategies and tips to incr participation in ADLs/IADLs              OT Long Term Goals - 08/12/21 0831       OT LONG TERM GOAL #1   Title Independent with updated HEP prn    Time 8    Period Weeks    Status Partially Met   07/29/21:  pt able to perform (cognitive activities) with cueing for initiation/set-up     OT LONG TERM GOAL #2    Title Pt to improve BUE function as evidenced by improving Box & Blocks score to 20 or greater    Baseline Rt/Lt = 17    Time 8    Period Weeks    Status Achieved   07/22/21:  R-19, L-18 blocks; 08/12/21: Rt and Lt = 27 blocks     OT LONG TERM GOAL #3   Title Pt to demo sufficient bilateral shoulder ROM to get shirt overhead and wash hair    Time 8    Period Weeks    Status Achieved   07/15/21     OT LONG TERM GOAL #4   Title Pt to improve bilateral hand coordination as evidenced by reducing speed on 9 hole pegt est to 60 sec or under    Baseline Rt = 69.18 sec, Lt = 64.38 sec    Time 8    Period Weeks    Status Achieved   07/22/21:  R-55.34sec, 57.48sec     OT LONG TERM GOAL #5   Title Pt to be overall min assist with BADLS    Time 8    Period Weeks    Status Achieved   07/15/21:  min A per pt/husband     OT LONG TERM GOAL #6   Title Pt to perform simple dyanmic standing task (brushing teeth, folding laundry) for 10 min. w/o LOB using one hand countertop support prn    Time 8    Period Weeks    Status Achieved   > 15 minutes                  Plan - 08/12/21 3559     Clinical Impression Statement Pt has met all STG's and LTG's at this time. Pt continues to need cueing for initiation/set-up, but overall w/ increased independence for ADLS.    OT Occupational Profile and History Detailed Assessment- Review of Records and additional review of physical, cognitive, psychosocial history related to current functional performance    Occupational performance deficits (Please refer to evaluation for details): ADL's;IADL's;Social Participation    Body Structure / Function / Physical Skills ADL;Decreased knowledge of use of DME;Strength;Balance;Dexterity;GMC;Tone;Edema;Body mechanics;UE functional  use;IADL;ROM;Sensation;Mobility;Flexibility;Coordination;Decreased knowledge of precautions;FMC    Cognitive Skills Energy/Drive;Memory;Problem Solve;Safety Awareness    Rehab Potential  Good    Clinical Decision Making Several treatment options, min-mod task modification necessary    Comorbidities Affecting Occupational Performance: Presence of comorbidities impacting occupational performance    Comorbidities impacting occupational performance description: Alzheimers, osteoporosis    Modification or Assistance to Complete Evaluation  Min-Moderate modification of tasks or assist with assess necessary to complete eval    OT Frequency 2x / week    OT Duration 8 weeks   or 16 visits over 3 months (d/t scheduling & transporation conflicts) plus eval.   OT Treatment/Interventions Self-care/ADL training;DME and/or AE instruction;Splinting;Compression bandaging;Therapeutic activities;Ultrasound;Therapeutic exercise;Cognitive remediation/compensation;Coping strategies training;Neuromuscular education;Functional Mobility Training;Passive range of motion;Visual/perceptual remediation/compensation;Manual Therapy;Patient/family education    Plan D/C OT    Consulted and Agree with Plan of Care Patient             Patient will benefit from skilled therapeutic intervention in order to improve the following deficits and impairments:   Body Structure / Function / Physical Skills: ADL, Decreased knowledge of use of DME, Strength, Balance, Dexterity, GMC, Tone, Edema, Body mechanics, UE functional use, IADL, ROM, Sensation, Mobility, Flexibility, Coordination, Decreased knowledge of precautions, [place] Cognitive Skills: Energy/Drive, Memory, Problem Solve, Safety Awareness     Visit Diagnosis: Other symptoms and signs involving the nervous system  Other symptoms and signs involving the musculoskeletal system    Problem List Patient Active Problem List   Diagnosis Date Noted   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   Abnormal liver enzymes 09/19/2019   Blurred vision 09/19/2019   Hypercalcemia 03/15/2019   Major depression in full remission ([place]) 11/24/2018   Memory loss 12/24/2016   Encounter for  well adult exam with abnormal findings 06/13/2013   Hyperparathyroidism, primary ([place]) 06/12/2013   GERD (gastroesophageal reflux disease) 02/19/2012   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   COUGH DUE TO ACE INHIBITORS 11/25/2009   Diabetes ([place]) 10/13/2007   Dyslipidemia 10/13/2007   Essential hypertension 10/13/2007   MENOPAUSAL SYNDROME 10/13/2007   Osteoporosis 10/13/2007    OCCUPATIONAL THERAPY DISCHARGE SUMMARY  Visits from Start of Care: 15  Current functional level related to goals / functional outcomes: See above - pt met all but still requires cues for HEP's   Remaining deficits: [employer] trader / Equipment: HEP's, education for increased participation in [place], memory strategies and cognitive tips   Patient agrees to discharge. Patient goals were met. Patient is being discharged due to meeting the stated rehab goals.Carey Bullocks, OTR/L 08/12/2021, 9:13 AM  [facility] [address] [place], [place], [address] Phone: [phone]   Fax:  [phone]  Name: Brandi Walsh MRN: [id number] Date of Birth: 1946/10/24

## 2021-08-14 ENCOUNTER — Ambulatory Visit: Payer: Medicare Other | Admitting: Occupational Therapy

## 2021-08-17 ENCOUNTER — Encounter: Payer: Self-pay | Admitting: Family

## 2021-09-03 ENCOUNTER — Encounter: Payer: Self-pay | Admitting: Family

## 2021-09-22 ENCOUNTER — Encounter: Payer: Self-pay | Admitting: Family

## 2021-09-23 ENCOUNTER — Encounter: Payer: Self-pay | Admitting: Family

## 2021-09-23 ENCOUNTER — Other Ambulatory Visit: Payer: Self-pay

## 2021-09-23 ENCOUNTER — Ambulatory Visit (INDEPENDENT_AMBULATORY_CARE_PROVIDER_SITE_OTHER): Payer: Medicare Other | Admitting: Family

## 2021-09-23 VITALS — BP 126/70 | HR 73 | Temp 97.5°F | Ht 62.0 in | Wt 138.4 lb

## 2021-09-23 DIAGNOSIS — E214 Other specified disorders of parathyroid gland: Secondary | ICD-10-CM | POA: Diagnosis not present

## 2021-09-23 DIAGNOSIS — Z1231 Encounter for screening mammogram for malignant neoplasm of breast: Secondary | ICD-10-CM

## 2021-09-23 DIAGNOSIS — E785 Hyperlipidemia, unspecified: Secondary | ICD-10-CM | POA: Diagnosis not present

## 2021-09-23 DIAGNOSIS — R7303 Prediabetes: Secondary | ICD-10-CM | POA: Diagnosis not present

## 2021-09-23 DIAGNOSIS — Z23 Encounter for immunization: Secondary | ICD-10-CM

## 2021-09-23 DIAGNOSIS — F0390 Unspecified dementia without behavioral disturbance: Secondary | ICD-10-CM

## 2021-09-23 NOTE — Progress Notes (Signed)
Brandi Walsh is a 75 y.o. 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     Cholecalciferol (VITAMIN D) 50 MCG (2000 UT) tablet Take 2,000 Units by mouth daily.     donepezil (ARICEPT) 10 MG tablet Take 1 tablet daily 90 tablet 3   escitalopram (LEXAPRO) 10 MG tablet TAKE 1 & 1/2 (ONE & ONE-HALF) TABLETS BY MOUTH ONCE DAILY 135 tablet 2   losartan (COZAAR) 50 MG tablet Take 1 tablet by mouth once daily 90 tablet 0   meloxicam (MOBIC) 15 MG tablet Take 1 tablet by mouth once daily 30 tablet 0   mupirocin ointment (BACTROBAN) 2 % Apply topically 3 (three) times daily. 30 g 1   omeprazole (PRILOSEC) 20 MG capsule Take 1 capsule (20 mg total) by mouth daily. 90 capsule 5   rosuvastatin (CRESTOR) 40 MG tablet Take 1 tablet by mouth once daily 90 tablet 0   No current facility-administered medications for this visit.    Allergies: Lovastatin  Past Medical History:  Diagnosis Date   Alzheimer's disease ([place])    COUGH DUE TO ACE INHIBITORS 11/25/2009   Qualifier: Diagnosis of  By: [name] MD, Sean A    Depression    DIABETES MELLITUS, TYPE II 10/13/2007   Qualifier: Diagnosis of  By: Marca Ancona RMA, Lucy      HYPERLIPIDEMIA 10/13/2007   Qualifier: Diagnosis of  By: Reatha Armour, [place]     HYPERTENSION 10/13/2007   Qualifier: Diagnosis of  By: Reatha Armour, Lucy     MENOPAUSAL SYNDROME 10/13/2007   Qualifier: Diagnosis of  By: Marca Ancona RMA, Lucy     OSTEOPOROSIS 10/13/2007   Qualifier: Diagnosis of  By: Larose Kells      Past Surgical History:  Procedure Laterality Date   ABDOMINAL HYSTERECTOMY     BREAST BIOPSY  08/2002   TONSILLECTOMY      Family History  Problem Relation Age of Onset   Anxiety disorder Mother    Hyperparathyroidism Sister    Cancer Neg Hx    Breast cancer Neg Hx     Social History   Tobacco Use   Smoking status: Never   Smokeless tobacco: Never  Substance Use Topics   Alcohol use: No    Subjective:   Accompanied by husband- granddaughter is on the phone and helps supplement history; Requesting order for mammogram/ yearly labs; no acute concerns today- working with endocrine for hyperparathyroid management and neurology for dementia;   Agrees to flu shot- there is some confusion about whether she got flu shot at time of her last COVID booster; [facility] only shows COVID booster being administered in September however;     Objective:  Vitals:   09/23/21 1430  BP: 126/70  Pulse: 73  Temp: (!) 97.5 F (36.4 C)  TempSrc: Oral  SpO2: 99%  Weight: 138 lb 6.4 oz (62.8 kg)  Height: _0  (1.575 m)    General: Well developed, well nourished, in no acute distress  Skin : Warm and dry.  Head: Normocephalic and atraumatic  Eyes: Sclera and conjunctiva clear; pupils round and reactive to light; extraocular movements intact  Ears: External normal; canals clear; tympanic membranes normal  Oropharynx: Pink, supple. No suspicious lesions  Neck: Supple without thyromegaly, adenopathy  Lungs: Respirations unlabored; clear to auscultation bilaterally without wheeze, rales, rhonchi  CVS exam: normal rate and regular rhythm.  Musculoskeletal: No deformities; no active joint  inflammation  Extremities: + edema left hand, cyanosis, clubbing  Vessels: Symmetric bilaterally  Neurologic: Alert and oriented; speech intact; face symmetrical; moves all extremities well; CNII-XII intact without focal deficit   Assessment:  1. Visit for screening mammogram   2. Other specified disorders of parathyroid gland ([place])   3. Hyperlipidemia, unspecified hyperlipidemia type   4. Pre-diabetes   5. Need for immunization against influenza   6. Dementia, unspecified dementia severity, unspecified dementia type, unspecified whether behavioral, psychotic, or mood disturbance or anxiety ([place])     Plan:  Order updated; Check PTH; keep planned follow up with endocrine; Check lipid panel today; Check Hgba1c today; Flu shot given;  Keep planned follow up with neurology;  This visit occurred during the SARS-CoV-2 public health emergency.  Safety protocols were in place, including screening questions prior to the visit, additional usage of staff PPE, and extensive cleaning of exam room while observing appropriate contact time as indicated for disinfecting solutions.    No follow-ups on file.  Orders Placed This Encounter  Procedures   MM Digital Screening    Standing Status:   Future    Standing Expiration Date:   09/23/2022    Order Specific Question:   Reason for Exam (SYMPTOM  OR DIAGNOSIS REQUIRED)    Answer:   screening mammogram    Order Specific Question:   Preferred imaging location?    Answer:   [facility]   Flu Vaccine QUAD High Dose(Fluad)   CBC with Differential/Platelet   Comp Met (CMET)   Lipid panel   TSH   PTH, Intact and Calcium   Hemoglobin A1c    Requested Prescriptions    No prescriptions requested or ordered in this encounter

## 2021-09-24 LAB — COMPREHENSIVE METABOLIC PANEL
ALT: 36 U/L — ABNORMAL HIGH (ref 0–35)
AST: 28 U/L (ref 0–37)
Albumin: 3.8 g/dL (ref 3.5–5.2)
Alkaline Phosphatase: 91 U/L (ref 39–117)
BUN: 17 mg/dL (ref 6–23)
CO2: 27 mEq/L (ref 19–32)
Calcium: 9.9 mg/dL (ref 8.4–10.5)
Chloride: 107 mEq/L (ref 96–112)
Creatinine, Ser: 0.73 mg/dL (ref 0.40–1.20)
GFR: 80.26 mL/min (ref >60.00)
Glucose, Bld: 132 mg/dL — ABNORMAL HIGH (ref 70–99)
Potassium: 4 mEq/L (ref 3.5–5.1)
Sodium: 139 mEq/L (ref 135–145)
Total Bilirubin: 0.7 mg/dL (ref 0.2–1.2)
Total Protein: 6.3 g/dL (ref 6.0–8.3)

## 2021-09-24 LAB — CBC WITH DIFFERENTIAL/PLATELET
Basophils Absolute: 0.1 10*3/uL (ref 0.0–0.1)
Basophils Relative: 0.8 % (ref 0.0–3.0)
Eosinophils Absolute: 0.3 10*3/uL (ref 0.0–0.7)
Eosinophils Relative: 2.6 % (ref 0.0–5.0)
HCT: 41.5 % (ref 36.0–46.0)
Hemoglobin: 13.8 g/dL (ref 12.0–15.0)
Lymphocytes Relative: 21.5 % (ref 12.0–46.0)
Lymphs Abs: 2.1 10*3/uL (ref 0.7–4.0)
MCHC: 33.2 g/dL (ref 30.0–36.0)
MCV: 94 fl (ref 78.0–100.0)
Monocytes Absolute: 0.6 10*3/uL (ref 0.1–1.0)
Monocytes Relative: 6 % (ref 3.0–12.0)
Neutro Abs: 6.9 10*3/uL (ref 1.4–7.7)
Neutrophils Relative %: 69.1 % (ref 43.0–77.0)
Platelets: 202 10*3/uL (ref 150.0–400.0)
RBC: 4.41 Mil/uL (ref 3.87–5.11)
RDW: 13.7 % (ref 11.5–15.5)
WBC: 9.9 10*3/uL (ref 4.0–10.5)

## 2021-09-24 LAB — HEMOGLOBIN A1C: Hgb A1c MFr Bld: 5.5 % (ref 4.6–6.5)

## 2021-09-24 LAB — LIPID PANEL
Cholesterol: 113 mg/dL (ref 0–200)
HDL: 36.6 mg/dL — ABNORMAL LOW (ref >39.00)
LDL Cholesterol: 45 mg/dL (ref 0–99)
NonHDL: 76.33
Total CHOL/HDL Ratio: 3
Triglycerides: 158 mg/dL — ABNORMAL HIGH (ref 0.0–149.0)
VLDL: 31.6 mg/dL (ref 0.0–40.0)

## 2021-09-24 LAB — TSH: TSH: 0.76 u[IU]/mL (ref 0.35–5.50)

## 2021-09-24 LAB — PTH, INTACT AND CALCIUM
Calcium: 9.9 mg/dL (ref 8.6–10.4)
PTH: 114 pg/mL — ABNORMAL HIGH (ref 16–77)

## 2021-09-30 ENCOUNTER — Ambulatory Visit: Payer: Medicare Other | Admitting: Neurology

## 2021-09-30 ENCOUNTER — Other Ambulatory Visit: Payer: Self-pay

## 2021-09-30 ENCOUNTER — Encounter: Payer: Self-pay | Admitting: Neurology

## 2021-09-30 VITALS — BP 140/78 | HR 73 | Ht 60.0 in | Wt 140.6 lb

## 2021-09-30 DIAGNOSIS — G301 Alzheimer's disease with late onset: Secondary | ICD-10-CM

## 2021-09-30 DIAGNOSIS — G20 Parkinson's disease: Secondary | ICD-10-CM | POA: Diagnosis not present

## 2021-09-30 DIAGNOSIS — F02818 Dementia in other diseases classified elsewhere, unspecified severity, with other behavioral disturbance: Secondary | ICD-10-CM | POA: Diagnosis not present

## 2021-09-30 MED ORDER — DONEPEZIL HCL 10 MG PO TABS: ORAL_TABLET | ORAL | 3 refills | Status: DC

## 2021-09-30 NOTE — Progress Notes (Signed)
NEUROLOGY FOLLOW UP OFFICE NOTE  Brandi Walsh [id number] 12-02-1945  HISTORY OF PRESENT ILLNESS: I had the pleasure of seeing Brandi Walsh in follow-up in the neurology clinic on 09/30/2021.  The patient was last seen 8 months ago for Alzheimer's disease. She is again accompanied by her granddaughter [name] who supplement the history today.  Records and images were personally reviewed where available.  Since her last visit, [name] feels she has been overall stable and did really well with physical therapy and occupational therapy. Her husband administers her medications, [name] fixes them in pillboxes for both of them. Family manages finances and meals. She is able to brush her own teeth, her husband helps with dressing and bathing. She continues to see Psychiatry, anxiety has been under control with Lexapro 15mg  daily. No hallucinations or paranoia. They have an aide coming 3 days a week from 8-11am. She denies any headaches, dizziness, no falls. She has intermittent swelling in both arms, her left hand is swollen today with an overlying bruise, she denies injuring it. She denies any further joint pains with meloxicam, PT/OT also helped. She still has a little difficulty with stiffness getting out of bed. No tremors. Sleep is good. She denies any side effects on Donepezil 10mg  daily.    History on Initial Assessment 06/25/2020: This is a 75 year old left-handed woman with a history of hypertension, hyperlipidemia, depression, anxiety, presenting for evaluation of recently diagnosed dementia on Neuropsychological testing done last 01/2020. Records reviewed. She reports her memory may not be so great. Her granddaughter 66 provides additional information. 02/2020 reports an incident in December 2018 when she had visual and auditory hallucinations and was admitted to [facility] and started on psychiatric medication. She had Neuropsychological testing in April 2018 with Dr. 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 Cognitive testing indicated possible very mild global attenuation of cognitive function relative to estimated premorbid intellectual abilities, with the only area of true impairment being phonemic verbal fluency and memory retrieval afor newly learned auditory information, probable mild frontal-subcortical dysfunction. Thought process was still mildly disorganized, unclear if longstanding and/or related to primary psychiatric disorder. She was no longer having delusions at that time. May 2018 reports that since then, there has been a steady decline with her memory. Psychiatric disorder had been stable for 2 years with adjustment in Lexapro and Olanzapine. Over the past 6 months, [name] noticed a biggest turning point in her cognition. She also noticed slower movements. She is overall more anxious. Her psychiatrist has advised monitoring for tardive dyskinesia, her lower jaw movements have progressively worsened. [name] was not sure about medication adherence, so she has been helping the last few weeks. She noted waxing and waning symptoms and requested repeat Neuropsychological testing. Records from Dr. [name] were reviewed, Neuropsychological testing in 01/2020 indicated severely impaired function on tasks measuring learning and memory coupled with signs of executive dysfunction, reduced processing speed, and impaired object naming, indicating Major Neurocognitive Disorder, Alzheimer's disease most probable but not definitive. Cholinesterase inhibitor was recommended, she was started on Donepezil 5mg  daily by her Psychiatrist, no side effects.   She lives with her husband. She states her memory "may not be so great." [name] reports that she has always been very sharp, so the slightest things she does not remember would bother her. She denies getting lost driving but drives rarely, around once a  month. She and her husband do bills  together, there may be some missed bills. Her husband manages meals. She is slow to answer questions but answers appropriately and thoughtfully. She has some dizziness in the morning and endorses vertigo when lying flat. She has some blurred vision. She has had some pain between her shoulder pain for a few weeks. She reports numbness in both calves. She has had bilateral leg swelling even before Donepezil was started. She has occasional constipation. She has occasional hand tremors when anxious. No headaches, dysarthria/dysphagia, focal weakness, anosmia. Sleep is okay. 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 She reports mood is good. [name] notes she worries/stresses too much. Everyday is a little different with how she feels, how quickly she is moving. Sometimes she is a bit more agitated and does not want to eat, saying her partials don't fit well or her things get stuck (she is s/p esophageal dilatation). [name] notes she has always been thoughtful but not as slow. She has always been very precise and detail-oriented, which is not changed, but she is not usually as slow. No visual/auditory hallucinations. She is independent with dressing and bathing but gets stuck sometimes putting on clothes. If she gets stuck in a thought, she freezes and cannot move. No recent changes in psychiatric medication, a year ago Lexapro was increased to 10mg  to increase motivation, which has not helped much. notes there was more lucidity to her surroundings at that time, which is not present now. No family history of dementia, no history of concussions or alcohol use.  Diagnostic Data: MRI brain with and without contrast done 06/2020 did not show any acute changes. There was mild diffuse atrophy, bilateral hippocampal atrophy, mild chronic microvascular disease.   PAST MEDICAL HISTORY: Past Medical History:  Diagnosis Date   Alzheimer's disease (HCC)    COUGH DUE TO ACE  INHIBITORS 11/25/2009   Qualifier: Diagnosis of  By: 11/27/2009 MD, Sean A    Depression    DIABETES MELLITUS, TYPE II 10/13/2007   Qualifier: Diagnosis of  By: 10/15/2007 RMA, Lucy     HYPERLIPIDEMIA 10/13/2007   Qualifier: Diagnosis of  By: 10/15/2007, Lucy     HYPERTENSION 10/13/2007   Qualifier: Diagnosis of  By: 10/15/2007, Lucy     MENOPAUSAL SYNDROME 10/13/2007   Qualifier: Diagnosis of  By: 10/15/2007 RMA, Lucy     OSTEOPOROSIS 10/13/2007   Qualifier: Diagnosis of  By: 10/15/2007      MEDICATIONS: Current Outpatient Medications on File Prior to Visit  Medication Sig Dispense Refill   alendronate (FOSAMAX) 70 MG tablet Take 70 mg by mouth once a week.     Cholecalciferol (VITAMIN D) 50 MCG (2000 UT) tablet Take 2,000 Units by mouth daily.     donepezil (ARICEPT) 10 MG tablet Take 1 tablet daily 90 tablet 3   escitalopram (LEXAPRO) 10 MG tablet TAKE 1 & 1/2 (ONE & ONE-HALF) TABLETS BY MOUTH ONCE DAILY 135 tablet 2   losartan (COZAAR) 50 MG tablet Take 1 tablet by mouth once daily 90 tablet 0   meloxicam (MOBIC) 15 MG tablet Take 1 tablet by mouth once daily 30 tablet 0   mupirocin ointment (BACTROBAN) 2 % Apply topically 3 (three) times daily. 30 g 1   omeprazole (PRILOSEC) 20 MG capsule Take 1 capsule (20 mg total) by mouth daily. 90 capsule 5   rosuvastatin (CRESTOR) 40 MG tablet Take 1 tablet by mouth once daily 90 tablet 0   No current facility-administered medications on file  prior to visit.    ALLERGIES: Allergies  Allergen Reactions   Lovastatin     REACTION: Rash    FAMILY HISTORY: Family History  Problem Relation Age of Onset   Anxiety disorder Mother    Hyperparathyroidism Sister    Cancer Neg Hx    Breast cancer Neg Hx     SOCIAL HISTORY: Social History   Socioeconomic History   Marital status: Married    Spouse name: Not on file   Number of children: Not on file   Years of education: Not on file   Highest education level: Not on file  Occupational  History   Occupation: Retired   Tobacco Use   Smoking status: Never   Smokeless tobacco: Never  Vaping Use   Vaping Use: Never used  Substance and Sexual Activity   Alcohol use: No   Drug use: No   Sexual activity: Yes  Other Topics Concern   Not on file  Social History Narrative   Left Handed   Two Story Home   Lives with Husband    Drinks coffee and [employer] sometimes   Social Determinants of Health   Financial Resource Strain: Not on file  Food Insecurity: Not on file  Transportation Needs: Not on file  Physical Activity: Not on file  Stress: Not on file  Social Connections: Not on file  Intimate Partner Violence: Not on file     PHYSICAL EXAM: Vitals:   09/30/21 0825  BP: 140/78  Pulse: 73  SpO2: 99%   General: No acute distress, flat affect/hypomimia Head:  Normocephalic/atraumatic Skin/Extremities: No rash, no edema Neurological Exam: alert and oriented to person, place, and time. No aphasia or dysarthria. Fund of knowledge is reduced.  Recent and remote memory are impaired, 0/3 delayed recall.  Attention and concentration are reduced, 3/5 WORLD backwards.   Cranial nerves: Pupils equal, round. Extraocular movements intact with no nystagmus. Visual fields full.  No facial asymmetry.  Motor: +cogwheeling bilaterally. Muscle strength 5/5 throughout with no pronator drift.   Finger to nose testing intact.  Gait slow and cautious with no arm swing. She is bradykinetic with movements, however has fair finger and foot taps. No tremor. Negative postural instability.   IMPRESSION: This is a 75 yo LH woman with a history of hypertension, hyperlipidemia, depression, anxiety, with dementia, probably Alzheimer's disease on Neuropsychological evaluation in 01/2020. MRI brain showed bilateral hippocampal atrophy. She has been overall stable, with good response to physical therapy/occupational therapy. She is not in pain today, we were able to do more with examining for  cogwheeling, she is bradykinetic as well. 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 Continue Donepezil 10mg  daily. Follow-up in 6 months with [facility] , they know to call for any changes.    Thank you for allowing me to participate in her care.  Please do not hesitate to call for any questions or concerns.    Marlowe Kays, M.D.   CC: Patrcia Dolly, FNP

## 2021-09-30 NOTE — Patient Instructions (Signed)
Good to see you!  Continue Donepezil 10mg  daily  2. Recommend increasing physical exercise and activity  3. Follow-up in 6 months, call for any changes  FALL PRECAUTIONS: Be cautious when walking. Scan the area for obstacles that may increase the risk of trips and falls. When getting up in the mornings, sit up at the edge of the bed for a few minutes before getting out of bed. Consider elevating the bed at the head end to avoid drop of blood pressure when getting up. Walk always in a well-lit room (use night lights in the walls). Avoid area rugs or power cords from appliances in the middle of the walkways. Use a walker or a cane if necessary and consider physical therapy for balance exercise. Get your eyesight checked regularly.   HOME SAFETY: Consider the safety of the kitchen when operating appliances like stoves, microwave oven, and blender. Consider having supervision and share cooking responsibilities until no longer able to participate in those. Accidents with firearms and other hazards in the house should be identified and addressed as well.   ABILITY TO BE LEFT ALONE: If patient is unable to contact 911 operator, consider using LifeLine, or when the need is there, arrange for someone to stay with patients. Smoking is a fire hazard, consider supervision or cessation. Risk of wandering should be assessed by caregiver and if detected at any point, supervision and safe proof recommendations should be instituted.   RECOMMENDATIONS FOR ALL PATIENTS WITH MEMORY PROBLEMS: 1. Continue to exercise (Recommend 30 minutes of walking everyday, or 3 hours every week) 2. Increase social interactions - continue going to [place] and enjoy social gatherings with friends and family 3. Eat healthy, avoid fried foods and eat more fruits and vegetables 4. Maintain adequate blood pressure, blood sugar, and blood cholesterol level. Reducing the risk of stroke and cardiovascular disease also helps promoting better  memory. 5. Avoid stressful situations. Live a simple life and avoid aggravations. Organize your time and prepare for the next day in anticipation. 6. Sleep well, avoid any interruptions of sleep and avoid any distractions in the bedroom that may interfere with adequate sleep quality 7. Avoid sugar, avoid sweets as there is a strong link between excessive sugar intake, diabetes, and cognitive impairment The Mediterranean diet has been shown to help patients reduce the risk of progressive memory disorders and reduces cardiovascular risk. This includes eating fish, eat fruits and green leafy vegetables, nuts like almonds and hazelnuts, walnuts, and also use olive oil. Avoid fast foods and fried foods as much as possible. Avoid sweets and sugar as sugar use has been linked to worsening of memory function.  There is always a concern of gradual progression of memory problems. If this is the case, then we may need to adjust level of care according to patient needs. Support, both to the patient and caregiver, should then be put into place.

## 2021-10-03 ENCOUNTER — Other Ambulatory Visit: Payer: Self-pay | Admitting: Psychiatry

## 2021-10-03 ENCOUNTER — Other Ambulatory Visit: Payer: Self-pay | Admitting: Family

## 2021-10-03 DIAGNOSIS — F324 Major depressive disorder, single episode, in partial remission: Secondary | ICD-10-CM

## 2021-10-03 DIAGNOSIS — F419 Anxiety disorder, unspecified: Secondary | ICD-10-CM

## 2021-10-17 ENCOUNTER — Other Ambulatory Visit: Payer: Self-pay | Admitting: Family

## 2021-11-03 ENCOUNTER — Other Ambulatory Visit: Payer: Self-pay | Admitting: Gastroenterology

## 2021-11-04 ENCOUNTER — Ambulatory Visit (HOSPITAL_BASED_OUTPATIENT_CLINIC_OR_DEPARTMENT_OTHER)
Admission: RE | Admit: 2021-11-04 | Discharge: 2021-11-04 | Disposition: A | Discharge status: Home / Self Care | Payer: Medicare Other | Source: Ambulatory Visit | Attending: Family | Admitting: Family

## 2021-11-04 ENCOUNTER — Encounter (HOSPITAL_BASED_OUTPATIENT_CLINIC_OR_DEPARTMENT_OTHER): Payer: Self-pay

## 2021-11-04 ENCOUNTER — Other Ambulatory Visit: Payer: Self-pay

## 2021-11-04 DIAGNOSIS — Z1231 Encounter for screening mammogram for malignant neoplasm of breast: Secondary | ICD-10-CM | POA: Insufficient documentation

## 2021-11-07 ENCOUNTER — Other Ambulatory Visit: Payer: Self-pay | Admitting: Gastroenterology

## 2021-11-11 ENCOUNTER — Telehealth: Payer: Self-pay | Admitting: Gastroenterology

## 2021-11-11 MED ORDER — OMEPRAZOLE 20 MG PO CPDR
20.0000 mg | DELAYED_RELEASE_CAPSULE | Freq: Every day | ORAL | 5 refills | Status: DC
Start: 2021-11-11 — End: 2022-12-01

## 2021-11-11 NOTE — Telephone Encounter (Signed)
Inbound call from patient's granddaughter stating patient needs additional refills for omeprazole to be sent to [facility] on [address] in [place] please.

## 2021-11-29 ENCOUNTER — Other Ambulatory Visit: Payer: Self-pay | Admitting: Family

## 2021-12-02 ENCOUNTER — Other Ambulatory Visit: Payer: Self-pay

## 2021-12-02 ENCOUNTER — Ambulatory Visit: Payer: Medicare Other | Admitting: Psychiatry

## 2021-12-02 ENCOUNTER — Encounter: Payer: Self-pay | Admitting: Psychiatry

## 2021-12-02 DIAGNOSIS — F419 Anxiety disorder, unspecified: Secondary | ICD-10-CM

## 2021-12-02 DIAGNOSIS — F3342 Major depressive disorder, recurrent, in full remission: Secondary | ICD-10-CM

## 2021-12-02 MED ORDER — ESCITALOPRAM OXALATE 10 MG PO TABS: ORAL_TABLET | ORAL | 3 refills | Status: DC

## 2021-12-02 NOTE — Progress Notes (Signed)
Brandi Walsh [id number] [birth year] 76 y.o.  Subjective:   Patient ID:  Brandi Walsh is a 76 y.o. (DOB 07/13/1946) female.  Chief Complaint:  Chief Complaint  Patient presents with   Follow-up    H/o anxiety, depression, and psychosis    HPI Brandi Walsh presents to the office today for follow-up of anxiety, depression, and h/o psychosis. She is accompanied by husband. He denies any concerns and reports that she has been doing well other than continued cognitive decline. He reports that her mood has been stable and that he has not observed any possible signs of psychosis. Brandi Walsh denies depressed mood or anxiety. Sleeping well. Appetite has good. Energy is "up." Enjoys TV. She reports that her concentration has been good. Denies SI.   Denies AH, VH, or paranoia.  Currently with husband 24/7. They have in home assistance 3 days a week.     AIMS    Flowsheet Row Office Visit from 12/02/2021 in [facility] Office Visit from 05/06/2020 in [facility] Office Visit from 08/08/2019 in [facility]  AIMS Total Score 4 6 1       Mini-Mental    Flowsheet Row Office Visit from 01/27/2021 in [facility]  Total Score (max 30 points ) 20      PHQ2-9    Flowsheet Row Office Visit from 09/23/2021 in [facility] Office Visit from 03/19/2021 in [facility] Visit from 10/23/2020 in [facility] Visit from 09/19/2019 in [facility]  PHQ-2 Total Score 0 0 1 0  PHQ-9 Total Score -- -- 2 --        Review of Systems:  Review of Systems  Musculoskeletal:  Negative for gait problem.  Neurological:  Positive for tremors.  Psychiatric/Behavioral:         Please refer to HPI   Medications: I have reviewed the patient's current medications.  Current Outpatient Medications  Medication Sig Dispense  Refill   alendronate (FOSAMAX) 70 MG tablet Take 70 mg by mouth once a week.     Cholecalciferol (VITAMIN D) 50 MCG (2000 UT) tablet Take 2,000 Units by mouth daily.     donepezil (ARICEPT) 10 MG tablet Take 1 tablet daily 90 tablet 3   losartan (COZAAR) 50 MG tablet Take 1 tablet by mouth once daily 90 tablet 0   meloxicam (MOBIC) 15 MG tablet Take 1 tablet by mouth once daily 90 tablet 0   omeprazole (PRILOSEC) 20 MG capsule Take 1 capsule (20 mg total) by mouth daily. 90 capsule 5   rosuvastatin (CRESTOR) 40 MG tablet Take 1 tablet by mouth once daily 90 tablet 3   escitalopram (LEXAPRO) 10 MG tablet TAKE 1 & 1/2 (ONE & ONE-HALF) TABLETS BY MOUTH ONCE DAILY 135 tablet 3   No current facility-administered medications for this visit.    Medication Side Effects: None  Allergies:  Allergies  Allergen Reactions   Lovastatin     REACTION: Rash    Past Medical History:  Diagnosis Date   Alzheimer's disease (HCC)    COUGH DUE TO ACE INHIBITORS 11/25/2009   Qualifier: Diagnosis of  By: 11/27/2009 MD, Sean A    Depression    DIABETES MELLITUS, TYPE II 10/13/2007   Qualifier: Diagnosis of  By: 10/15/2007     HYPERLIPIDEMIA 10/13/2007   Qualifier: Diagnosis of  By: 10/15/2007  HYPERTENSION 10/13/2007   Qualifier: Diagnosis of  By: Samara SnideBrand RMA, Lucy     MENOPAUSAL SYNDROME 10/13/2007   Qualifier: Diagnosis of  By: Tyrone AppleBrand RMA, Lucy     OSTEOPOROSIS 10/13/2007   Qualifier: Diagnosis of  By: Samara SnideBrand RMA, Lucy      Past Medical History, Surgical history, Social history, and Family history were reviewed and updated as appropriate.   Please see review of systems for further details on the patient's review from today.   Objective:   Physical Exam:  Wt 130 lb (59 kg)    BMI 25.39 kg/m   Physical Exam Constitutional:      General: She is not in acute distress. Musculoskeletal:        General: No deformity.  Neurological:     Mental Status: She is alert and oriented to  person, place, and time.     Coordination: Coordination normal.  Psychiatric:        Attention and Perception: Attention and perception normal. She does not perceive auditory or visual hallucinations.        Mood and Affect: Mood normal. Mood is not anxious or depressed. Affect is not labile, blunt, angry or inappropriate.        Speech: Speech normal.        Behavior: Behavior is slowed. Behavior is cooperative.        Thought Content: Thought content normal. Thought content is not paranoid or delusional. Thought content does not include homicidal or suicidal ideation. Thought content does not include homicidal or suicidal plan.        Cognition and Memory: Cognition is impaired. She exhibits impaired recent memory.     Comments: Insight and judgment fair    Lab Review:     Component Value Date/Time   NA 139 09/23/2021 1521   K 4.0 09/23/2021 1521   CL 107 09/23/2021 1521   CO2 27 09/23/2021 1521   GLUCOSE 132 (H) 09/23/2021 1521   BUN 17 09/23/2021 1521   CREATININE 0.73 09/23/2021 1521   CREATININE 0.73 08/02/2020 1458   CALCIUM 9.9 09/23/2021 1521   CALCIUM 9.9 09/23/2021 1521   CALCIUM 10.8 (H) 02/19/2012 1129   PROT 6.3 09/23/2021 1521   ALBUMIN 3.8 09/23/2021 1521   AST 28 09/23/2021 1521   ALT 36 (H) 09/23/2021 1521   ALKPHOS 91 09/23/2021 1521   BILITOT 0.7 09/23/2021 1521   GFRNONAA >60 07/03/2020 1732   GFRAA >60 07/03/2020 1732       Component Value Date/Time   WBC 9.9 09/23/2021 1521   RBC 4.41 09/23/2021 1521   HGB 13.8 09/23/2021 1521   HGB 15.3 12/16/2010 1045   HCT 41.5 09/23/2021 1521   HCT 43.8 12/16/2010 1045   PLT 202.0 09/23/2021 1521   PLT 224 12/16/2010 1045   MCV 94.0 09/23/2021 1521   MCV 88.9 12/16/2010 1045   MCH 32.0 07/23/2020 1010   MCHC 33.2 09/23/2021 1521   RDW 13.7 09/23/2021 1521   RDW 12.5 12/16/2010 1045   LYMPHSABS 2.1 09/23/2021 1521   LYMPHSABS 2.1 12/16/2010 1045   MONOABS 0.6 09/23/2021 1521   MONOABS 0.8 12/16/2010  1045   EOSABS 0.3 09/23/2021 1521   EOSABS 0.3 12/16/2010 1045   BASOSABS 0.1 09/23/2021 1521   BASOSABS 0.1 12/16/2010 1045    No results found for: POCLITH, LITHIUM   No results found for: PHENYTOIN, PHENOBARB, VALPROATE, CBMZ   .res Assessment: Plan:    Will continue current plan of care since target signs  and symptoms are well controlled without any tolerability issues. Continue Lexapro 15 mg po qd for anxiety and depression.  Pt to follow-up in one year or sooner if clinically indicated.  Patient advised to contact office with any questions, adverse effects, or acute worsening in signs and symptoms.   Brandi Walsh was seen today for follow-up.  Diagnoses and all orders for this visit:  Recurrent major depressive disorder, in full remission (HCC) -     escitalopram (LEXAPRO) 10 MG tablet; TAKE 1 & 1/2 (ONE & ONE-HALF) TABLETS BY MOUTH ONCE DAILY  Anxiety disorder, unspecified type -     escitalopram (LEXAPRO) 10 MG tablet; TAKE 1 & 1/2 (ONE & ONE-HALF) TABLETS BY MOUTH ONCE DAILY     Please see After Visit Summary for patient specific instructions.  Future Appointments  Date Time Provider Department Center  04/07/2022  9:00 AM [name], PA-C [facility] None  10/06/2022  8:30 AM Van Clines, MD [facility] None  12/02/2022 10:30 AM [name], PMHNP [facility] None    No orders of the defined types were placed in this encounter.   -------------------------------

## 2022-01-06 ENCOUNTER — Other Ambulatory Visit: Payer: Self-pay | Admitting: Family

## 2022-01-08 ENCOUNTER — Other Ambulatory Visit: Payer: Self-pay

## 2022-01-08 ENCOUNTER — Other Ambulatory Visit: Payer: Medicare Other

## 2022-01-08 DIAGNOSIS — Z515 Encounter for palliative care: Secondary | ICD-10-CM

## 2022-01-11 NOTE — Progress Notes (Signed)
COMMUNITY PALLIATIVE CARE SW NOTE  PATIENT NAME: Brandi Walsh DOB: 09/04/1946 MRN: [id number]  PRIMARY CARE PROVIDER: Marrian Salvage, FNP  RESPONSIBLE PARTY:  Acct ID - Guarantor Home Phone Work Phone Relationship Acct Type  [id number] Brandi Walsh, FRIDGE* [phone]  Self P/F     [address], [place], [place] [address]   Due to the COVID-19 crisis, this virtual check-in visit was done via telephone from my office and it was initiated and consent by this patient and or family.  SOCIAL WORK TELEPHONE ENCOUNTER (3:30 pm- 3:45 pm)  SW completed a telephonic visit with patient's granddaughter-Brandi Walsh. Brandi Walsh reported that she is the primary caregiver for patient. She advised that patient has been doing well. Her intake is good.  She has no issues taking her medication. Brandi Walsh is still interested in having palliative care follow her grandmother. A visit was scheduled for 01/19/22 @ 2:30 pm with Dr. Hollace Kinnier.        [address] [place], [place]

## 2022-02-19 ENCOUNTER — Other Ambulatory Visit: Payer: Self-pay

## 2022-02-19 ENCOUNTER — Other Ambulatory Visit: Payer: Medicare Other | Admitting: Internal Medicine

## 2022-02-23 ENCOUNTER — Encounter: Payer: Self-pay | Admitting: Family

## 2022-03-10 ENCOUNTER — Encounter: Payer: Self-pay | Admitting: Internal Medicine

## 2022-03-10 ENCOUNTER — Other Ambulatory Visit: Payer: Medicare Other | Admitting: Internal Medicine

## 2022-03-10 VITALS — BP 142/84 | HR 80 | Temp 97.9°F | Resp 18 | Wt 145.6 lb

## 2022-03-10 DIAGNOSIS — F01518 Vascular dementia, unspecified severity, with other behavioral disturbance: Secondary | ICD-10-CM

## 2022-03-10 DIAGNOSIS — Z741 Need for assistance with personal care: Secondary | ICD-10-CM

## 2022-03-10 DIAGNOSIS — Z515 Encounter for palliative care: Secondary | ICD-10-CM

## 2022-03-10 DIAGNOSIS — L259 Unspecified contact dermatitis, unspecified cause: Secondary | ICD-10-CM

## 2022-03-10 NOTE — Progress Notes (Signed)
[facility] Palliative Care Follow-Up Visit Telephone: [phone]  Fax: [phone]   Date of encounter: 03/10/22 9:37 AM PATIENT NAME: Brandi Walsh [address] [place] [address]   [phone] (home)  DOB: 1946/11/12 MRN: [id number] PRIMARY CARE PROVIDER:    Marrian Salvage, [place],  [address] [place] [address] [phone]  REFERRING PROVIDER:   Marrian Salvage, [place] [address] [place],  [place] [address] [phone]  RESPONSIBLE PARTY:    Contact Information     Name Relation Home Work [place] Spouse [phone]  [phone]   [place] Granddaughter   [phone]        I met face to face with patient and family in her home/facility. Palliative Care was asked to follow this patient by consultation request of  Marrian Salvage,* to address advance care planning and complex medical decision making. This is follow-up visit.                                     ASSESSMENT AND PLAN / RECOMMENDATIONS:   Advance Care Planning/Goals of Care: Goals include to maximize quality of life and symptom management. Patient/health care surrogate gave his/her permission to discuss.Our advance care planning conversation included a discussion about:    The value and importance of advance care planning  Experiences with loved ones who have been seriously ill or have died  Exploration of personal, cultural or spiritual beliefs that might influence medical decisions  Exploration of goals of care in the event of a sudden injury or illness  Identification  of a healthcare agent  Review and updating or creation of an  advance directive document . Decision not to resuscitate or to de-escalate disease focused treatments due to poor prognosis. CODE STATUS:  DNR; introduced MOST form today to pt's husband, Hassell Done and to granddaughter Asencion Partridge by phone--they will review and  discuss this prior to my next visit with her in 4 weeks.  I also recommended that they locate the gold DNR that Hassell Done says is somewhere in the home and place it on the refrigerator in case of emergency.  Symptom Management/Plan: 1. Vascular dementia with behavioral disturbance ([place]) -had rating of FAST 7c and PPS weak 40% when seen by NP in 09/2020, but she's had improvement since then -also FAST is not applicable to vascular dementia -PPS is a strong 40% (still needs ADL help but partial participation) -continue three times weekly caregiver support but regular care by her husband and assistance with HCPOA duties and appts by their granddaughter, Asencion Partridge -is on aricept 6m daily and continues to ride with her husband while he is working dProduction assistant, radiofrom [employer] 2. Requires assistance with activities of daily living (ADL) -pt now participates some in her bathing and dressing, remains ambulatory w/o assistive device, eats well and loves her three times weekly frappes from [employer] -continue current support--her husband has no concerns or increased needs at this time--both pt and husband appear happy and content and she certainly seems well taken care of with her prior hip wound well-healed (continuing a preventive vaseline on area)  3. Contact dermatitis, unspecified contact dermatitis type, unspecified trigger -rash is across thoracic region of her back on BOTH sides--papular rash that does not appear to bother her, comes and goes -ok to continue hydrocortisone use for this  when it is very erythematous -may be brought on by heat but is not pustular  4. Palliative care encounter -reintroduced palliative care as they had not had a visit for some time after provider change -introduced MOST form and confirmed DNR status -will completed MOST with them next visit     Follow up Palliative Care Visit: Palliative care will continue to follow for complex medical decision  making, advance care planning, and clarification of goals. Return 04/28/2022 and prn.  This visit was coded based on medical decision making (MDM).  PPS: 40%  HOSPICE ELIGIBILITY/DIAGNOSIS: TBD  Chief Complaint: Follow-up palliative visit  HISTORY OF PRESENT ILLNESS:  Brandi Walsh is a 76 y.o. year old female  with vascular dementia without behaviors, Parkinson's per chart, but not on meds, gerd, osteoporosis, osteoarthritis, hyperlipidemia, and depression with anxiety.   Pt lives at home with her husband, Hassell Done, who is the primary direct caregiver.  He bathes her each am, she's dressed by 8am.  He still works Retail buyer for [employer] and she rides with him so he always knows what she's up to, what and when she's eaten, etc.  She does not wander.  She does ambulate w/o assistive device.  No falls.  Appetite is good now with frappe and mcgriddle 3x per week and eats 2 other meals with her husband, as well.  Has gained back weight she'd previously lost.  She has no problems with agreeing to take or with swallowing her pills which her husband places in a once a day pillbox and she takes them each am when requested.  Her bowels move about qod.  She sleeps very well, better than her husband.  She denies pain.  She only gets agitated and resistant when her husband takes her to the bathroom if she has not gone for 5 hrs to be sure she does not have an accident.  She might pull back when he tries to get her up to walk to the restroom.  She does eventually give in and go  If weather is nice, they take a 1/2 mile walk in their neighborhood when [place] is not working.   I spoke also with Asencion Partridge who had no concerns.  We did discuss that the rash definitely did not appear to be shingles so there's no reason to postpone her vaccination at this point.    History obtained from review of EMR, discussion with primary team, and interview with family, facility staff/caregiver and/or Ms. Thedford.   I reviewed available labs, medications, imaging, studies and related documents from the EMR.  Records reviewed and summarized above.   ROS  General: NAD EYES: denies vision changes ENMT: denies dysphagia Cardiovascular: denies chest pain, denies DOE Pulmonary: denies cough, denies increased SOB Abdomen: endorses good appetite, denies constipation, endorses continence of bowel GU: denies dysuria, endorses incontinence of urine if they don't stick with a schedule MSK:  denies increased weakness,  no falls reported, gait is a bit unsteady especially when she first stands Skin: denies rashes or wounds--left hip wound that was stage 3 has healed Neurological: denies pain, denies insomnia Psych: Endorses positive mood Heme/lymph/immuno: denies bruises, abnormal bleeding  Physical Exam:   Today's Vitals   03/10/22 0917  BP: (!) 142/84  Pulse: 80  Resp: 18  Temp: 97.9 F (36.6 C)  TempSrc: Temporal  SpO2: 97%  Weight: 145 lb 9.6 oz (66 kg)   Body mass index is 28.44 kg/m.  Current and past weights:  145.6 lbs  today up from 140.6 lbs in 11/22 which was last weight accessible Constitutional: NAD General: WNWD EYES: anicteric sclera, lids intact, no discharge  ENMT: intact hearing, oral mucous membranes moist, dentition intact CV: A4S9, 2/6 systolic murmur, soft LE edema Pulmonary: LCTA, no increased work of breathing, no cough, room air Abdomen: intake 100%, normo-active BS + 4 quadrants, soft and non tender, no ascites GU: deferred MSK: no sarcopenia, moves all extremities, ambulatory but unsteady upon standing and falls back into chair rather than sitting down gradually after being up to weigh Skin: warm and dry, nonpruritic papular rash on midthoracic area on bilateral upper torso posteriorly Neuro:   generalized weakness,   cognitive impairment--spoke few words, did smile Psych: non-anxious affect, A and O to familiar faces Hem/lymph/immuno: no widespread bruising  CURRENT  PROBLEM LIST:  Patient Active Problem List   Diagnosis Date Noted   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   Abnormal liver enzymes 09/19/2019   Blurred vision 09/19/2019   Hypercalcemia 03/15/2019   Major depression in full remission ([place]) 11/24/2018   Memory loss 12/24/2016   Encounter for well adult exam with abnormal findings 06/13/2013   Hyperparathyroidism, primary ([place]) 06/12/2013   GERD (gastroesophageal reflux disease) 02/19/2012   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   COUGH DUE TO ACE INHIBITORS 11/25/2009   Diabetes ([place]) 10/13/2007   Dyslipidemia 10/13/2007   Essential hypertension 10/13/2007   MENOPAUSAL SYNDROME 10/13/2007   Osteoporosis 10/13/2007   PAST MEDICAL HISTORY:  Active Ambulatory Problems    Diagnosis Date Noted   Diabetes ([place]) 10/13/2007   Dyslipidemia 10/13/2007   Essential hypertension 10/13/2007   MENOPAUSAL SYNDROME 10/13/2007   Osteoporosis 10/13/2007   COUGH DUE TO ACE INHIBITORS 11/25/2009   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   GERD (gastroesophageal reflux disease) 02/19/2012   Hyperparathyroidism, primary ([place]) 06/12/2013   Encounter for well adult exam with abnormal findings 06/13/2013   Memory loss 12/24/2016   Major depression in full remission ([place]) 11/24/2018   Hypercalcemia 03/15/2019   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   Abnormal liver enzymes 09/19/2019   Blurred vision 09/19/2019   Resolved Ambulatory Problems    Diagnosis Date Noted   HTN (hypertension) 02/19/2012   Past Medical History:  Diagnosis Date   Alzheimer's disease ([place] [facility])    Depression    DIABETES MELLITUS, TYPE II 10/13/2007   HYPERLIPIDEMIA 10/13/2007   HYPERTENSION 10/13/2007   OSTEOPOROSIS 10/13/2007   SOCIAL HX:  Social History   Tobacco Use   Smoking status: Never   Smokeless tobacco: Never  Substance Use Topics   Alcohol use: No     ALLERGIES:  Allergies  Allergen Reactions   Lovastatin     REACTION: Rash     PERTINENT MEDICATIONS:  Outpatient Encounter Medications as of 03/10/2022   Medication Sig   alendronate (FOSAMAX) 70 MG tablet Take 70 mg by mouth once a week.   Cholecalciferol (VITAMIN D) 50 MCG (2000 UT) tablet Take 2,000 Units by mouth daily.   donepezil (ARICEPT) 10 MG tablet Take 1 tablet daily   escitalopram (LEXAPRO) 10 MG tablet TAKE 1 & 1/2 (ONE & ONE-HALF) TABLETS BY MOUTH ONCE DAILY   losartan (COZAAR) 50 MG tablet Take 1 tablet by mouth once daily   meloxicam (MOBIC) 15 MG tablet Take 1 tablet by mouth once daily   omeprazole (PRILOSEC) 20 MG capsule Take 1 capsule (20 mg total) by mouth daily.   rosuvastatin (CRESTOR) 40 MG tablet Take 1 tablet by mouth once daily   No facility-administered encounter medications on file as of 03/10/2022.  Thank you for the opportunity to participate in the care of Ms. Kasper.  The palliative care team will continue to follow. Please call our office at [phone] if we can be of additional assistance.   Hollace Kinnier, DO  COVID-19 PATIENT SCREENING TOOL Asked and negative response unless otherwise noted:  Have you had symptoms of covid, tested positive or been in contact with someone with symptoms/positive test in the past 5-10 days? no

## 2022-03-26 ENCOUNTER — Ambulatory Visit: Payer: Medicare Other | Admitting: Family

## 2022-03-30 ENCOUNTER — Other Ambulatory Visit: Payer: Self-pay | Admitting: Family

## 2022-04-03 ENCOUNTER — Ambulatory Visit (INDEPENDENT_AMBULATORY_CARE_PROVIDER_SITE_OTHER): Payer: Medicare Other | Admitting: Family

## 2022-04-03 ENCOUNTER — Encounter: Payer: Self-pay | Admitting: Family

## 2022-04-03 VITALS — BP 120/70 | HR 82 | Temp 98.3°F | Ht 59.0 in | Wt 147.8 lb

## 2022-04-03 DIAGNOSIS — R7303 Prediabetes: Secondary | ICD-10-CM | POA: Diagnosis not present

## 2022-04-03 DIAGNOSIS — E785 Hyperlipidemia, unspecified: Secondary | ICD-10-CM | POA: Diagnosis not present

## 2022-04-03 DIAGNOSIS — E210 Primary hyperparathyroidism: Secondary | ICD-10-CM | POA: Diagnosis not present

## 2022-04-03 DIAGNOSIS — F0390 Unspecified dementia without behavioral disturbance: Secondary | ICD-10-CM | POA: Diagnosis not present

## 2022-04-03 LAB — CBC WITH DIFFERENTIAL/PLATELET
Basophils Absolute: 0.1 10*3/uL (ref 0.0–0.1)
Basophils Relative: 0.7 % (ref 0.0–3.0)
Eosinophils Absolute: 0.2 10*3/uL (ref 0.0–0.7)
Eosinophils Relative: 2 % (ref 0.0–5.0)
HCT: 40.5 % (ref 36.0–46.0)
Hemoglobin: 13.6 g/dL (ref 12.0–15.0)
Lymphocytes Relative: 22.1 % (ref 12.0–46.0)
Lymphs Abs: 1.7 10*3/uL (ref 0.7–4.0)
MCHC: 33.6 g/dL (ref 30.0–36.0)
MCV: 88.6 fl (ref 78.0–100.0)
Monocytes Absolute: 0.5 10*3/uL (ref 0.1–1.0)
Monocytes Relative: 6.9 % (ref 3.0–12.0)
Neutro Abs: 5.4 10*3/uL (ref 1.4–7.7)
Neutrophils Relative %: 68.3 % (ref 43.0–77.0)
Platelets: 198 10*3/uL (ref 150.0–400.0)
RBC: 4.57 Mil/uL (ref 3.87–5.11)
RDW: 13.9 % (ref 11.5–15.5)
WBC: 7.9 10*3/uL (ref 4.0–10.5)

## 2022-04-03 LAB — COMPREHENSIVE METABOLIC PANEL
ALT: 39 U/L — ABNORMAL HIGH (ref 0–35)
AST: 35 U/L (ref 0–37)
Albumin: 3.7 g/dL (ref 3.5–5.2)
Alkaline Phosphatase: 78 U/L (ref 39–117)
BUN: 15 mg/dL (ref 6–23)
CO2: 29 mEq/L (ref 19–32)
Calcium: 9.5 mg/dL (ref 8.4–10.5)
Chloride: 105 mEq/L (ref 96–112)
Creatinine, Ser: 0.9 mg/dL (ref 0.40–1.20)
GFR: 62.2 mL/min (ref >60.00)
Glucose, Bld: 190 mg/dL — ABNORMAL HIGH (ref 70–99)
Potassium: 3.8 mEq/L (ref 3.5–5.1)
Sodium: 142 mEq/L (ref 135–145)
Total Bilirubin: 0.9 mg/dL (ref 0.2–1.2)
Total Protein: 6.2 g/dL (ref 6.0–8.3)

## 2022-04-03 LAB — VITAMIN D 25 HYDROXY (VIT D DEFICIENCY, FRACTURES): VITD: 36.02 ng/mL (ref 30.00–100.00)

## 2022-04-03 LAB — TSH: TSH: 0.89 u[IU]/mL (ref 0.35–5.50)

## 2022-04-03 LAB — HEMOGLOBIN A1C: Hgb A1c MFr Bld: 6.5 % (ref 4.6–6.5)

## 2022-04-03 MED ORDER — LOSARTAN POTASSIUM 50 MG PO TABS
50.0000 mg | ORAL_TABLET | Freq: Every day | ORAL | 3 refills | Status: DC
Start: 2022-04-03 — End: 2022-10-06

## 2022-04-03 NOTE — Progress Notes (Signed)
?Brandi Walsh is a 76 y.o. female with the following history as recorded in EpicCare:  ?Patient Active Problem List  ? Diagnosis Date Noted  ? 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  ? Abnormal liver enzymes 09/19/2019  ? Blurred vision 09/19/2019  ? Hypercalcemia 03/15/2019  ? Major depression in full remission ([place]) 11/24/2018  ? Memory loss 12/24/2016  ? Encounter for well adult exam with abnormal findings 06/13/2013  ? Hyperparathyroidism, primary ([place]) 06/12/2013  ? GERD (gastroesophageal reflux disease) 02/19/2012  ? 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 COUGH DUE TO ACE INHIBITORS 11/25/2009  ? Diabetes ([place]) 10/13/2007  ? Dyslipidemia 10/13/2007  ? Essential hypertension 10/13/2007  ? MENOPAUSAL SYNDROME 10/13/2007  ? Osteoporosis 10/13/2007  ?  ?Current Outpatient Medications  ?Medication Sig Dispense Refill  ? alendronate (FOSAMAX) 70 MG tablet Take 70 mg by mouth once a week.    ? Cholecalciferol (VITAMIN D) 50 MCG (2000 UT) tablet Take 2,000 Units by mouth daily.    ? donepezil (ARICEPT) 10 MG tablet Take 1 tablet daily 90 tablet 3  ? escitalopram (LEXAPRO) 10 MG tablet TAKE 1 & 1/2 (ONE & ONE-HALF) TABLETS BY MOUTH ONCE DAILY 135 tablet 3  ? meloxicam (MOBIC) 15 MG tablet Take 1 tablet by mouth once daily 90 tablet 0  ? omeprazole (PRILOSEC) 20 MG capsule Take 1 capsule (20 mg total) by mouth daily. 90 capsule 5  ? rosuvastatin (CRESTOR) 40 MG tablet Take 1 tablet by mouth once daily 90 tablet 3  ? losartan (COZAAR) 50 MG tablet Take 1 tablet (50 mg total) by mouth daily. 90 tablet 3  ? ?No current facility-administered medications for this visit.  ?  ?Allergies: Lovastatin  ?Past Medical History:  ?Diagnosis Date  ? Alzheimer's disease ([place])   ? COUGH DUE TO ACE INHIBITORS 11/25/2009  ? Qualifier: Diagnosis of  By: [name] MD, [name]   ? Depression   ? DIABETES MELLITUS, TYPE II 10/13/2007  ? Qualifier: Diagnosis of  By: Reatha Armour, Lucy    ? HYPERLIPIDEMIA 10/13/2007  ? Qualifier: Diagnosis of  By: Reatha Armour, Lucy    ?  HYPERTENSION 10/13/2007  ? Qualifier: Diagnosis of  By: Reatha Armour, Lucy    ? MENOPAUSAL SYNDROME 10/13/2007  ? Qualifier: Diagnosis of  By: Reatha Armour, Lucy    ? OSTEOPOROSIS 10/13/2007  ? Qualifier: Diagnosis of  By: Reatha Armour, Lucy    ?  ?Past Surgical History:  ?Procedure Laterality Date  ? ABDOMINAL HYSTERECTOMY    ? BREAST BIOPSY  08/2002  ? TONSILLECTOMY    ?  ?Family History  ?Problem Relation Age of Onset  ? Anxiety disorder Mother   ? Hyperparathyroidism Sister   ? Cancer Neg Hx   ? Breast cancer Neg Hx   ?  ?Social History  ? ?Tobacco Use  ? Smoking status: Never  ? Smokeless tobacco: Never  ?Substance Use Topics  ? Alcohol use: No  ?  ?Subjective:  ? ?Presents for 6 month follow up; requesting labs to be done in preparation for upcoming neurology and endocrine visits; husband is present and granddaughter ( on phone) helps provide history;  ?No acute concerns; ?Palliative care has re-established and is coming to patient's home regularly;  ?Patient has gained 20 pounds in the past year- eating much better;  ? ? ? ? ?Objective:  ?Vitals:  ? 04/03/22 0919  ?BP: 120/70  ?Pulse: 82  ?Temp: 98.3 ?F (36.8 ?C)  ?TempSrc: Oral  ?SpO2: 98%  ?Weight: 147 lb 12.8 oz (67 kg)  ?  Height: _0  (1.499 m)  ?  ?General: Well developed, well nourished, in no acute distress  ?Skin : Warm and dry.  ?Head: Normocephalic and atraumatic  ?Lungs: Respirations unlabored; clear to auscultation bilaterally without wheeze, rales, rhonchi  ?CVS exam: normal rate and regular rhythm.  ?Musculoskeletal: No deformities; no active joint inflammation  ?Extremities: No edema, cyanosis, clubbing  ?Vessels: Symmetric bilaterally  ?Neurologic: Alert and oriented; speech intact; face symmetrical; husband supports her while walking;  ? ?Assessment:  ?1. Pre-diabetes   ?2. Primary hyperparathyroidism ([place])   ?3. Hyperlipidemia, unspecified hyperlipidemia type   ?4. Dementia, unspecified dementia severity, unspecified dementia type, unspecified  whether behavioral, psychotic, or mood disturbance or anxiety ([place])   ?  ?Plan:  ?Update labs today in preparation for upcoming visit with her specialists- neurologist and endocrinologist; refills updated as requested;  ?Follow up in 6 months, sooner prn.  ? ?No follow-ups on file.  ?Orders Placed This Encounter  ?Procedures  ? CBC with Differential/Platelet  ? Comp Met (CMET)  ? Hemoglobin A1c  ? PTH, Intact and Calcium  ? TSH  ? Vitamin D (25 hydroxy)  ?  ?Requested Prescriptions  ? ?Signed Prescriptions Disp Refills  ? losartan (COZAAR) 50 MG tablet 90 tablet 3  ?  Sig: Take 1 tablet (50 mg total) by mouth daily.  ?  ? ?

## 2022-04-04 ENCOUNTER — Other Ambulatory Visit: Payer: Self-pay | Admitting: Family

## 2022-04-04 LAB — PTH, INTACT AND CALCIUM
Calcium: 9.6 mg/dL (ref 8.6–10.4)
PTH: 150 pg/mL — ABNORMAL HIGH (ref 16–77)

## 2022-04-06 NOTE — Progress Notes (Signed)
Great work environment ? ?NEUROLOGY FOLLOW UP OFFICE NOTE ? ?Brandi GitelmanBrenda P Walsh ?[id number] ?July 26, 1946 ? ?HISTORY OF PRESENT ILLNESS: ?I had the pleasure of seeing Brandi Walsh , 76 yo LH woman in follow-up in the neurology clinic on 04/07/2022. She was last seen on 09/30/2021  for Alzheimer's disease. She is on Donepezil 10mg  daily, tolerating well. She is accompanied by her husband and granddaughter Brandi Walsh on the phone who supplement the history today.  Records and images were personally reviewed where available.  ? Since her last visit, Brandi Walsh feels she has been overall stable.  She is reading more and does crossword puzzles. Her husband administers her medications, Brandi Walsh fixes them in pillboxes for both of them. ? Family manages finances and meals. There are no hygiene concerns. She is able to brush her own teeth, her husband helps with dressing and bathing. She continues to see Psychiatry, anxiety has been under control with Lexapro 15 mg daily. Denies hallucinations or paranoia. They have an aide coming 3 days a week from 8-11am. She denies any headaches, dizziness, no falls.  She likes to walk around the house.  She has intermittent swelling in both arms, her left hand is swollen today without any injuries. She denies any further joint pains with meloxicam, PT/OT also helped.  She still has a little difficulty with stiffness getting out of bed.  She sleeps well, from 9 PM to 7 AM, denies any sleepwalking or REM behavior.  She denies any tremors.  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 ? ? ?History on Initial Assessment 06/25/2020: This is a 76 year old left-handed woman with a history of hypertension, hyperlipidemia, depression, anxiety, presenting for evaluation of recently diagnosed dementia on Neuropsychological testing done last 01/2020. Records reviewed. She reports her memory may not be so great. Her granddaughter Brandi Walsh provides  additional information. Brandi Walsh reports an incident in December 2018 when she had visual and auditory hallucinations and was [facility] and started on psychiatric medication. She had Neuropsychological testing in April 2018 with Dr. 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 Cognitive testing indicated possible very mild global attenuation of cognitive function relative to estimated premorbid intellectual abilities, with the only area of true impairment being phonemic verbal fluency and memory retrieval afor newly learned auditory information, probable mild frontal-subcortical dysfunction. Thought process was still mildly disorganized, unclear if longstanding and/or related to primary psychiatric disorder. She was no longer having delusions at that time. Brandi Walsh reports that since then, there has been a steady decline with her memory. Psychiatric disorder had been stable for 2 years with adjustment in Lexapro and Olanzapine. Over the past 6 months, Brandi Walsh noticed a biggest turning point in her cognition. She also noticed slower movements. She is overall more anxious. Her psychiatrist has advised monitoring for tardive dyskinesia, her lower jaw movements have progressively worsened. Brandi Walsh was not sure about medication adherence, so she has been helping the last few weeks. She noted waxing and waning symptoms and requested repeat Neuropsychological testing. Records from Dr. 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 Cholinesterase inhibitor was recommended, she was started on Donepezil 5mg  daily by her Psychiatrist, no  side effects.  ? ?She lives with her husband. She states her memory "may not be so great." Brandi Mylar reports that she has always been very sharp, so the slightest things she does not remember would bother her. She denies getting lost driving but drives rarely, around once a month. She and her husband do bills together, there may be some missed bills. Her husband manages meals. She is slow to answer questions but answers appropriately and thoughtfully. She has some dizziness in the morning and endorses vertigo when lying flat. She has some blurred vision. She has had some pain between her shoulder pain for a few weeks. She reports numbness in both calves. She has had bilateral leg swelling even before Donepezil was started. She has occasional constipation. She has occasional hand tremors when anxious. No headaches, dysarthria/dysphagia, focal weakness, anosmia. Sleep is okay. 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 She reports mood is good. Brandi Mylar notes she worries/stresses too much. Everyday is a little different with how she feels, how quickly she is moving. Sometimes she is a bit more agitated and does not want to eat, saying her partials don't fit well or her things get stuck (she is s/p esophageal dilatation). Brandi Mylar notes she has always been thoughtful but not as slow. She has always been very precise and detail-oriented, which is not changed, but she is not usually as slow. No visual/auditory hallucinations. She is independent with dressing and bathing but gets stuck sometimes putting on clothes. If she gets stuck in a thought, she freezes and cannot move. No recent changes in psychiatric medication, a year ago Lexapro was increased to 10mg  to increase motivation, which has not helped much. notes there was more lucidity to her surroundings at that time, which is not present now. No family history of dementia, no history of concussions or alcohol use. ? ?Diagnostic Data: ?MRI brain with and without  contrast done 06/2020 did not show any acute changes. There was mild diffuse atrophy, bilateral hippocampal atrophy, mild chronic microvascular disease.  ? ?PAST MEDICAL HISTORY: ?Past Medical History:  ?Diagnosis Date  ? Alzheimer's disease ([facility])   ? COUGH DUE TO ACE INHIBITORS 11/25/2009  ? Qualifier: Diagnosis of  By: 11/27/2009 MD, Everardo All   ? Depression   ? DIABETES MELLITUS, TYPE II 10/13/2007  ? Qualifier: Diagnosis of  By: 10/15/2007, Lucy    ? HYPERLIPIDEMIA 10/13/2007  ? Qualifier: Diagnosis of  By: 10/15/2007, Lucy    ? HYPERTENSION 10/13/2007  ? Qualifier: Diagnosis of  By: 10/15/2007, Lucy    ? MENOPAUSAL SYNDROME 10/13/2007  ? Qualifier: Diagnosis of  By: 10/15/2007, Lucy    ? OSTEOPOROSIS 10/13/2007  ? Qualifier: Diagnosis of  By: 10/15/2007, Lucy    ? ? ?MEDICATIONS: ?Current Outpatient Medications on File Prior to Visit  ?Medication Sig Dispense Refill  ? alendronate (FOSAMAX) 70 MG tablet Take 70 mg by mouth once a week.    ? Cholecalciferol (VITAMIN D) 50 MCG (2000 UT) tablet Take 2,000 Units by mouth daily.    ? escitalopram (LEXAPRO) 10 MG tablet TAKE 1 & 1/2 (ONE & ONE-HALF) TABLETS BY MOUTH ONCE DAILY 135 tablet 3  ? losartan (COZAAR) 50 MG tablet Take 1 tablet (50 mg total) by mouth daily. 90 tablet 3  ? meloxicam (MOBIC) 15 MG tablet Take 1 tablet by mouth once daily 90 tablet 0  ? omeprazole (PRILOSEC) 20 MG capsule Take 1 capsule (20 mg total) by mouth daily.  90 capsule 5  ? rosuvastatin (CRESTOR) 40 MG tablet Take 1 tablet by mouth once daily 90 tablet 3  ? ?No current facility-administered medications on file prior to visit.  ? ? ?ALLERGIES: ?Allergies  ?Allergen Reactions  ? Lovastatin   ?  REACTION: Rash  ? ? ?FAMILY HISTORY: ?Family History  ?Problem Relation Age of Onset  ? Anxiety disorder Mother   ? Hyperparathyroidism Sister   ? Cancer Neg Hx   ? Breast cancer Neg Hx   ? ? ?SOCIAL HISTORY: ?Social History  ? ?Socioeconomic History  ? Marital status: Married  ?  Spouse name: Not on file  ?  Number of children: Not on file  ? Years of education: Not on file  ? Highest education level: Not on file  ?Occupational History  ? Occupation: Retired   ?Tobacco Use  ? Smoking status: Never  ? [place]

## 2022-04-07 ENCOUNTER — Ambulatory Visit: Payer: Medicare Other | Admitting: Physician Assistant

## 2022-04-07 ENCOUNTER — Encounter: Payer: Self-pay | Admitting: Physician Assistant

## 2022-04-07 VITALS — BP 123/84 | HR 112 | Resp 18 | Ht 63.0 in | Wt 145.0 lb

## 2022-04-07 DIAGNOSIS — G3184 Mild cognitive impairment, so stated: Secondary | ICD-10-CM

## 2022-04-07 HISTORY — DX: Mild cognitive impairment of uncertain or unknown etiology: G31.84

## 2022-04-07 MED ORDER — DONEPEZIL HCL 10 MG PO TABS: ORAL_TABLET | ORAL | 3 refills | Status: DC

## 2022-04-07 NOTE — Patient Instructions (Addendum)
Good to see you! ? ?Continue Donepezil 10mg  daily ? ?2. Recommend increasing physical exercise and activity ? ?3. Continue your mood meds by Primary and Psych  ? ?3. Follow-up in 6 months, call for any changes ? ?FALL PRECAUTIONS: Be cautious when walking. Scan the area for obstacles that may increase the risk of trips and falls. When getting up in the mornings, sit up at the edge of the bed for a few minutes before getting out of bed. Consider elevating the bed at the head end to avoid drop of blood pressure when getting up. Walk always in a well-lit room (use night lights in the walls). Avoid area rugs or power cords from appliances in the middle of the walkways. Use a walker or a cane if necessary and consider physical therapy for balance exercise. Get your eyesight checked regularly. ? ? ?HOME SAFETY: Consider the safety of the kitchen when operating appliances like stoves, microwave oven, and blender. Consider having supervision and share cooking responsibilities until no longer able to participate in those. Accidents with firearms and other hazards in the house should be identified and addressed as well. ? ? ?ABILITY TO BE LEFT ALONE: If patient is unable to contact 911 operator, consider using LifeLine, or when the need is there, arrange for someone to stay with patients. Smoking is a fire hazard, consider supervision or cessation. Risk of wandering should be assessed by caregiver and if detected at any point, supervision and safe proof recommendations should be instituted. ? ? ?RECOMMENDATIONS FOR ALL PATIENTS WITH MEMORY PROBLEMS: ?1. Continue to exercise (Recommend 30 minutes of walking everyday, or 3 hours every week) ?2. Increase social interactions - continue going to [place] and enjoy social gatherings with friends and family ?3. Eat healthy, avoid fried foods and eat more fruits and vegetables ?4. Maintain adequate blood pressure, blood sugar, and blood cholesterol level. Reducing the risk of stroke  and cardiovascular disease also helps promoting better memory. ?5. Avoid stressful situations. Live a simple life and avoid aggravations. Organize your time and prepare for the next day in anticipation. ?6. Sleep well, avoid any interruptions of sleep and avoid any distractions in the bedroom that may interfere with adequate sleep quality ?7. Avoid sugar, avoid sweets as there is a strong link between excessive sugar intake, diabetes, and cognitive impairment ?The Mediterranean diet has been shown to help patients reduce the risk of progressive memory disorders and reduces cardiovascular risk. This includes eating fish, eat fruits and green leafy vegetables, nuts like almonds and hazelnuts, walnuts, and also use olive oil. Avoid fast foods and fried foods as much as possible. Avoid sweets and sugar as sugar use has been linked to worsening of memory function. ? ?There is always a concern of gradual progression of memory problems. If this is the case, then we may need to adjust level of care according to patient needs. Support, both to the patient and caregiver, should then be put into place. ? ?

## 2022-04-28 ENCOUNTER — Other Ambulatory Visit: Payer: Medicare Other | Admitting: Internal Medicine

## 2022-04-28 DIAGNOSIS — L259 Unspecified contact dermatitis, unspecified cause: Secondary | ICD-10-CM

## 2022-04-28 DIAGNOSIS — F01518 Vascular dementia, unspecified severity, with other behavioral disturbance: Secondary | ICD-10-CM

## 2022-04-28 DIAGNOSIS — Z515 Encounter for palliative care: Secondary | ICD-10-CM

## 2022-04-28 DIAGNOSIS — Z23 Encounter for immunization: Secondary | ICD-10-CM

## 2022-04-28 NOTE — Progress Notes (Unsigned)
[facility] Follow-Up Visit Telephone: [phone]  Fax: [phone]   Date of encounter: 04/28/22 6:41 AM PATIENT NAME: Brandi Walsh [address] [place] [address]   [phone] (home)  DOB: [birth year] MRN: [id number] PRIMARY CARE PROVIDER:    Marrian Salvage, [place],  [address] [place] [address] [phone]  REFERRING PROVIDER:   Marrian Salvage, [place] [address] [place],  [place] [address] [phone]  RESPONSIBLE PARTY:    Contact Information     Name Relation Home Work [place] Spouse [phone]  [phone]   [place] Granddaughter   [phone]        I met face to face with patient and family in her home/facility. Palliative Care was asked to follow this patient by consultation request of  Marrian Salvage,* to address advance care planning and complex medical decision making. This is follow-up visit.                                     ASSESSMENT AND PLAN / RECOMMENDATIONS:   Advance Care Planning/Goals of Care: Goals include to maximize quality of life and symptom management. Patient/health care surrogate gave his/her permission to discuss.Our advance care planning conversation included a discussion about:    The value and importance of advance care planning  Experiences with loved ones who have been seriously ill or have died  Exploration of personal, cultural or spiritual beliefs that might influence medical decisions  Exploration of goals of care in the event of a sudden injury or illness  Identification  of a healthcare agent  Review and updating or creation of an  advance directive document . Decision not to resuscitate or to de-escalate disease focused treatments due to poor prognosis. CODE STATUS:  DNR, MOST:  DNR, limited, determine use/limitation of abx, IVF trial, no feeding tube completed today with pt, her  husband and her grddtr, Brandi Walsh  Symptom Management/Plan: 1. Vascular dementia with behavioral disturbance ([place]) -seems about the same as last visit -continues to be ambulatory with walker, help with ADLs from her husband and rides with him for his job, tolerating meds well, remains on statin and aricept though aricept benefit now felt to be limited in vascular dementia  2. Contact dermatitis, unspecified contact dermatitis type, unspecified trigger -pattern on skin quite even like it's coming from something she's wearing or against; however, nothing can be located to explain, does not itch or hurt and never has -recommended using cortisone which did improve it before -does not look at all like shingles and has historically been on both shoulder blades  3. Need for shingles vaccine -recommended going ahead with this to prevent her from getting PHN should she get shingles  4. Palliative care encounter -MOST completed today -goal to stay in the home with caregiver support as she has been -has done well and no increased needs at this time -granddaughter has nursing background and highly supportive of pt and husband, Hassell Done (sounds like he's developing more health problems and has a lot of appts that are challenging to keep up with at this point)    Follow up Palliative Care Visit: Palliative care will continue to follow for complex medical decision making, advance care planning, and clarification of goals. Return Visit   weeks or prn.  This visit was coded based on medical  decision making (MDM). 23 minutes spent on ACP/MOST completion  PPS: weak 50% as requires adl help but still doing hobbies  HOSPICE ELIGIBILITY/DIAGNOSIS: not at present /vascular dementia  Chief Complaint: Follow-up palliative visit  HISTORY OF PRESENT ILLNESS:  LASHONE STAUBER is a 76 y.o. year old female  with vascular dementia, hyperlipidemia, depression, osteoporosis, gerd, OA, ongoing issues with rash on her  back that comes and goes, seen today for completion of most and for caregiver support/education re dementia.   Palliative vs primary--they go every 6 months to PCP.   Spot on back--not itching or bothersome, improved with hydrocortisone--does not look like shingles--ok to get vaccine Spot in pelvic area--sebaceous cyst.    History obtained from review of EMR, discussion with primary team, and interview with family, facility staff/caregiver and/or Brandi Walsh.  I reviewed available labs, medications, imaging, studies and related documents from the EMR.  Records reviewed and summarized above.   ROS  General: NAD EYES: denies vision changes ENMT: denies dysphagia Cardiovascular: denies chest pain, denies DOE Pulmonary: denies cough, denies increased SOB Abdomen: endorses good appetite, denies constipation, endorses continence of bowel GU: denies dysuria, endorses some incontinence of urine MSK:  denies increased weakness,  no falls reported Skin: denies rashes or wounds Neurological: denies pain, denies insomnia Psych: Endorses positive mood Heme/lymph/immuno: denies bruises, abnormal bleeding  Physical Exam: Current and past weights:  145 lbs 5/9 Constitutional: NAD General: WNWD  EYES: anicteric sclera, lids intact, no discharge  ENMT: intact hearing, oral mucous membranes moist, dentition intact CV: S1S2, RRR, no LE edema Pulmonary: LCTA, no increased work of breathing, no cough, room air Abdomen: intake 100%--likes biscuits, shakes, etc, normo-active BS + 4 quadrants, soft and non tender, no ascites GU: deferred MSK: sarcopenia, moves all extremities, ambulatory with walker, slow getting up Skin: warm and dry, papular rash on right shoulder blade with very regular pattern, no vesicles or evidence of excoriation, no drainage Neuro:  generalized weakness, moderate  cognitive impairment Psych: non-anxious affect, A and O x 2 person and place, not time Hem/lymph/immuno: no  widespread bruising  CURRENT PROBLEM LIST:  Patient Active Problem List   Diagnosis Date Noted   Mild cognitive impairment with memory loss 04/07/2022   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   Abnormal liver enzymes 09/19/2019   Blurred vision 09/19/2019   Hypercalcemia 03/15/2019   Major depression in full remission ([place]) 11/24/2018   Memory loss 12/24/2016   Encounter for well adult exam with abnormal findings 06/13/2013   Hyperparathyroidism, primary ([place]) 06/12/2013   GERD (gastroesophageal reflux disease) 02/19/2012   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   COUGH DUE TO ACE INHIBITORS 11/25/2009   Diabetes ([place]) 10/13/2007   Dyslipidemia 10/13/2007   Essential hypertension 10/13/2007   MENOPAUSAL SYNDROME 10/13/2007   Osteoporosis 10/13/2007   PAST MEDICAL HISTORY:  Active Ambulatory Problems    Diagnosis Date Noted   Diabetes ([place]) 10/13/2007   Dyslipidemia 10/13/2007   Essential hypertension 10/13/2007   MENOPAUSAL SYNDROME 10/13/2007   Osteoporosis 10/13/2007   COUGH DUE TO ACE INHIBITORS 11/25/2009   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   GERD (gastroesophageal reflux disease) 02/19/2012   Hyperparathyroidism, primary ([place]) 06/12/2013   Encounter for well adult exam with abnormal findings 06/13/2013   Memory loss 12/24/2016   Major depression in full remission ([place]) 11/24/2018   Hypercalcemia 03/15/2019   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   Abnormal liver enzymes 09/19/2019   Blurred vision 09/19/2019   Mild cognitive impairment with memory loss 04/07/2022   Resolved Ambulatory Problems    Diagnosis Date  Noted   HTN (hypertension) 02/19/2012   Past Medical History:  Diagnosis Date   Alzheimer's disease ([place])    Depression    DIABETES MELLITUS, TYPE II 10/13/2007   HYPERLIPIDEMIA 10/13/2007   HYPERTENSION 10/13/2007   OSTEOPOROSIS 10/13/2007   SOCIAL HX:  Social History   Tobacco Use   Smoking status: Never   Smokeless tobacco: Never  Substance Use Topics   Alcohol use: No     ALLERGIES:   Allergies  Allergen Reactions   Lovastatin     REACTION: Rash     PERTINENT MEDICATIONS:  Outpatient Encounter Medications as of 04/28/2022  Medication Sig   alendronate (FOSAMAX) 70 MG tablet Take 70 mg by mouth once a week.   Cholecalciferol (VITAMIN D) 50 MCG (2000 UT) tablet Take 2,000 Units by mouth daily.   donepezil (ARICEPT) 10 MG tablet Take 1 tablet daily   escitalopram (LEXAPRO) 10 MG tablet TAKE 1 & 1/2 (ONE & ONE-HALF) TABLETS BY MOUTH ONCE DAILY   losartan (COZAAR) 50 MG tablet Take 1 tablet (50 mg total) by mouth daily.   meloxicam (MOBIC) 15 MG tablet Take 1 tablet by mouth once daily   omeprazole (PRILOSEC) 20 MG capsule Take 1 capsule (20 mg total) by mouth daily.   rosuvastatin (CRESTOR) 40 MG tablet Take 1 tablet by mouth once daily   No facility-administered encounter medications on file as of 04/28/2022.   Thank you for the opportunity to participate in the care of Brandi Walsh.  The palliative care team will continue to follow. Please call our office at [phone] if we can be of additional assistance.   Hollace Kinnier, DO  COVID-19 PATIENT SCREENING TOOL Asked and negative response unless otherwise noted:  Have you had symptoms of covid, tested positive or been in contact with someone with symptoms/positive test in the past 5-10 days? no

## 2022-04-30 ENCOUNTER — Encounter: Payer: Self-pay | Admitting: Internal Medicine

## 2022-05-04 ENCOUNTER — Encounter: Payer: Self-pay | Admitting: Neurology

## 2022-06-18 ENCOUNTER — Other Ambulatory Visit: Payer: Self-pay | Admitting: Family

## 2022-08-19 IMAGING — MG MM DIGITAL SCREENING BILAT W/ TOMO AND CAD
8 series · 9 of 24 positions shown · non-contrast
Comparison: Previous exam(s).

CLINICAL DATA: Screening.

EXAM:
DIGITAL SCREENING BILATERAL MAMMOGRAM WITH TOMOSYNTHESIS AND CAD
TECHNIQUE: Bilateral screening digital craniocaudal and mediolateral oblique
mammograms were obtained. Bilateral screening digital breast
tomosynthesis was performed. The images were evaluated with
computer-aided detection.

[L CC synth-2D]
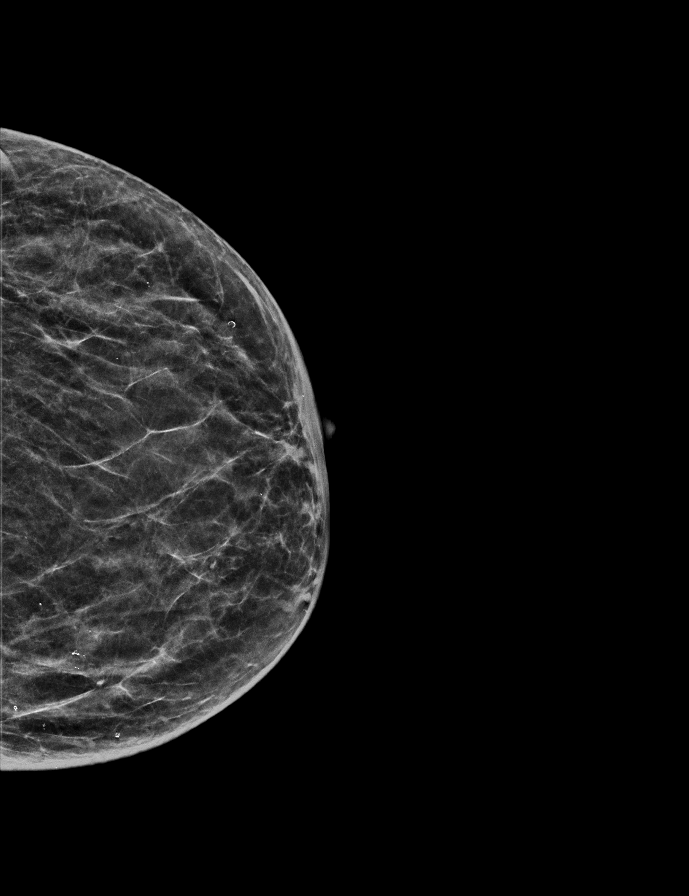

[R CC synth-2D]
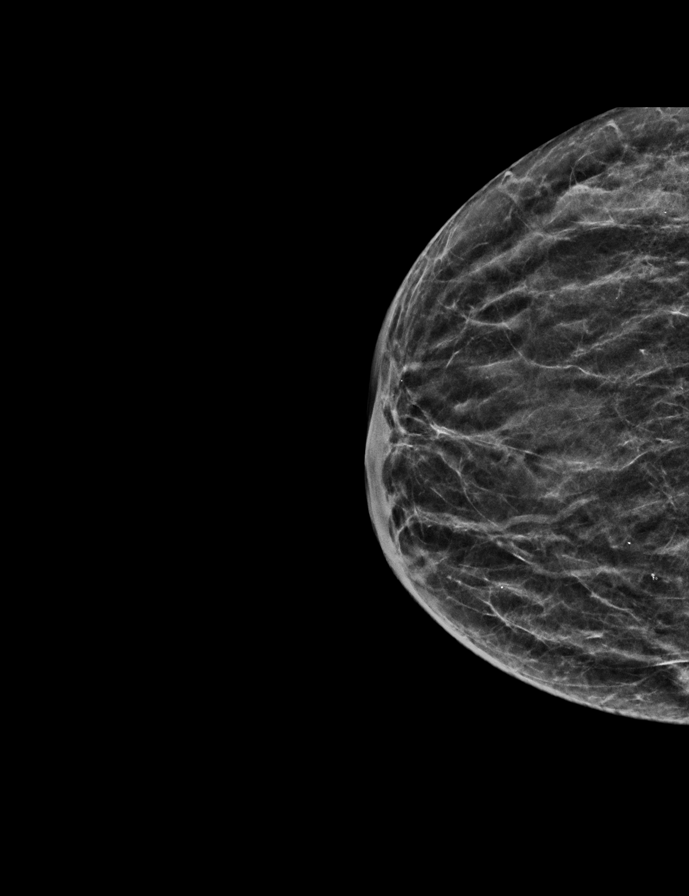

[L MLO synth-2D]
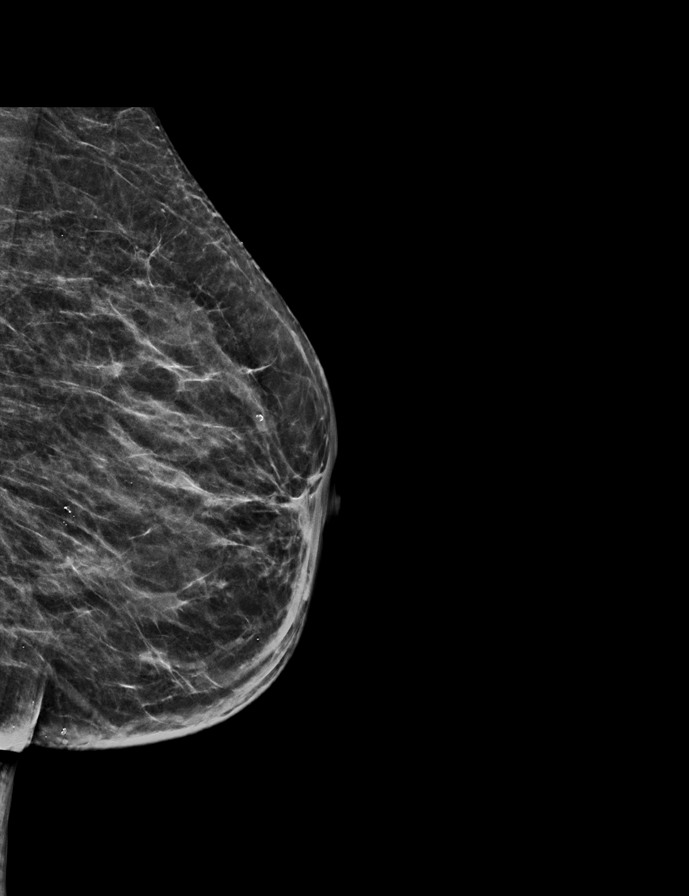

[R MLO synth-2D]
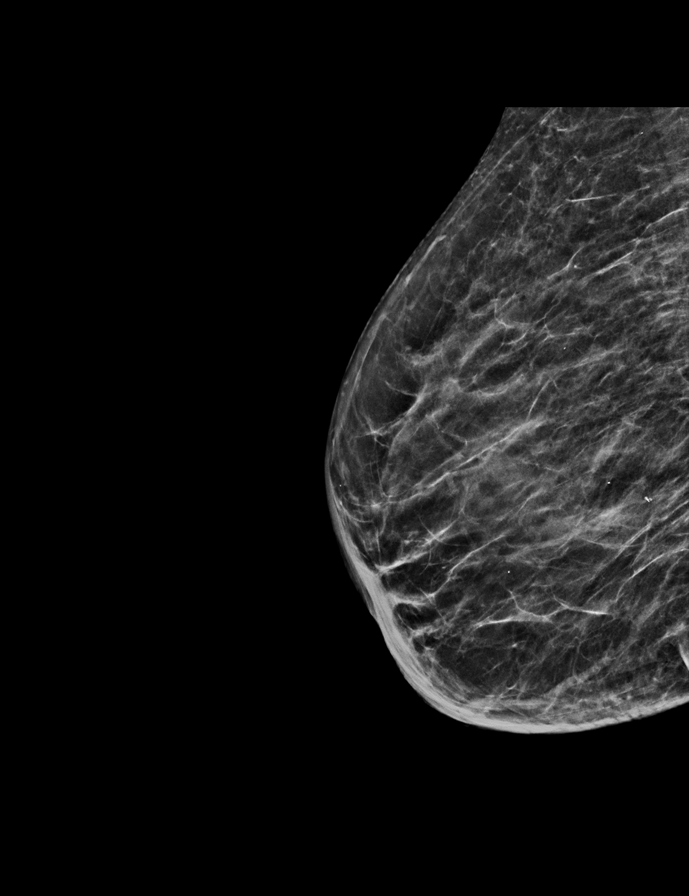

[L CC tomo · 2 of 42 frames shown]
[frame 14/42]
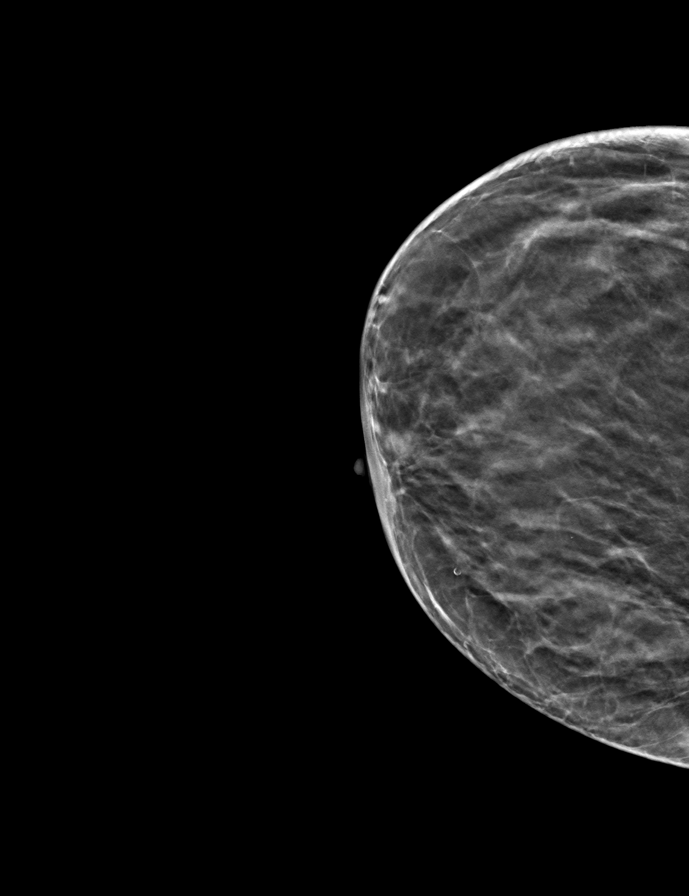
[frame 21/42]
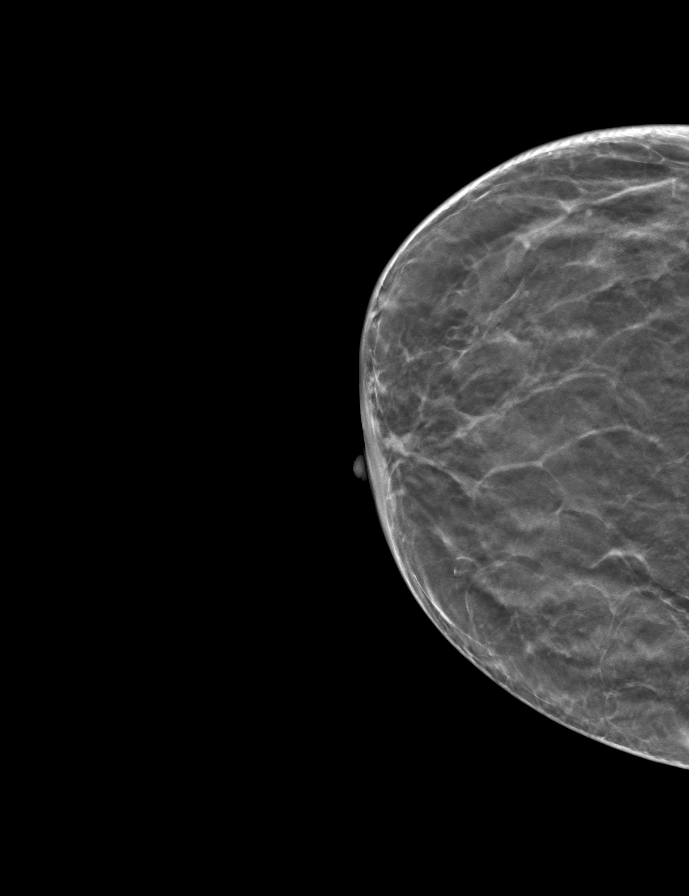

[R MLO tomo · tomo slice 24/47.0]
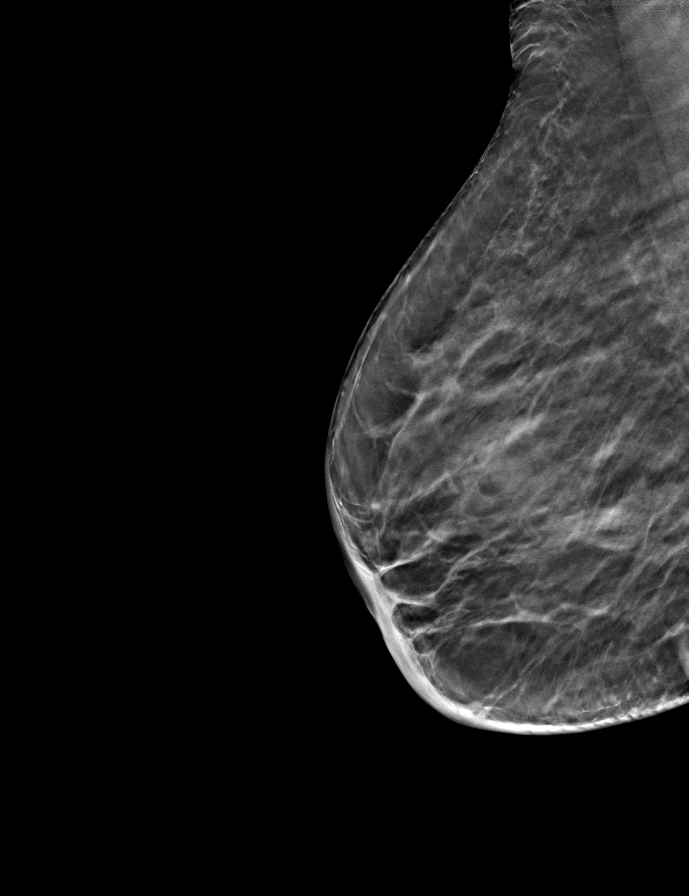

[R CC tomo · tomo slice 21/42.0]
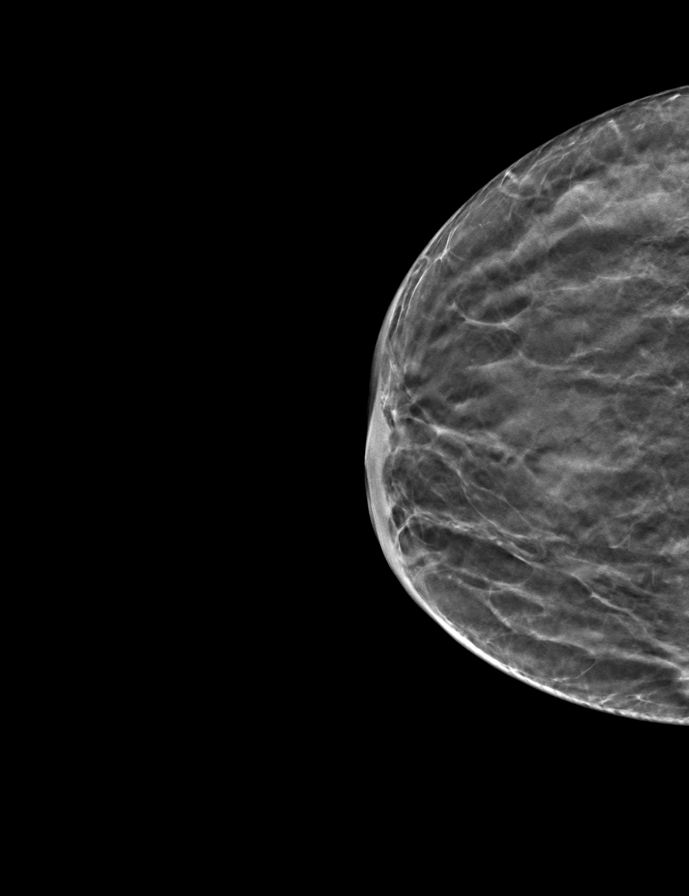

[L MLO tomo · tomo slice 25/49.0]
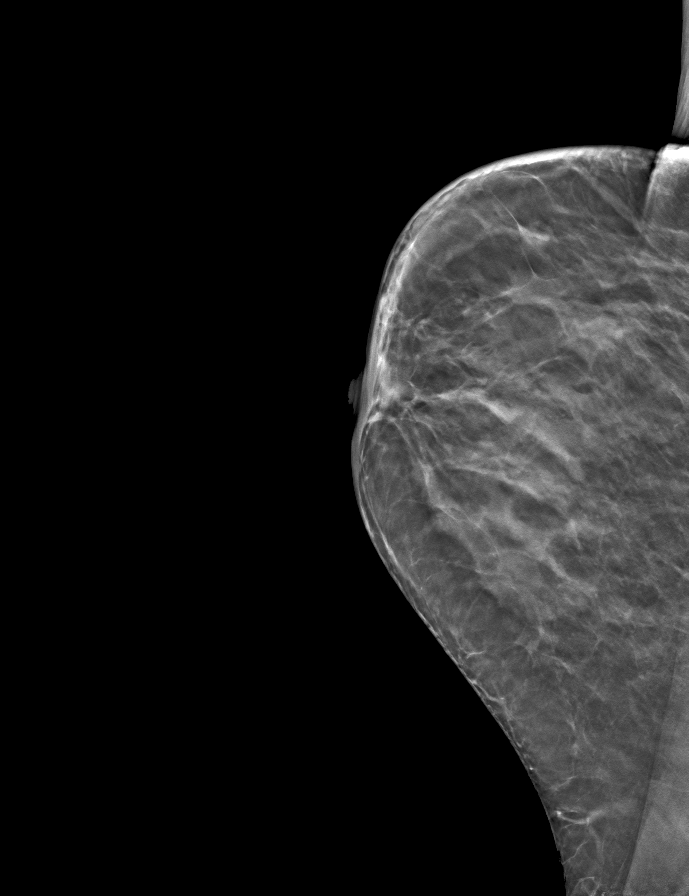

[9 of 24 positions shown; findings below may reference images not displayed]

ACR Breast Density Category b: There are scattered areas of
fibroglandular density.
FINDINGS: There are no findings suspicious for malignancy.
IMPRESSION: No mammographic evidence of malignancy. A result letter of this
screening mammogram will be mailed directly to the patient.

RECOMMENDATION:
Screening mammogram in one year. (Code:51-O-LD2)

BI-RADS CATEGORY  1: Negative.

## 2022-09-15 ENCOUNTER — Telehealth: Payer: Self-pay | Admitting: Family

## 2022-09-15 NOTE — Telephone Encounter (Signed)
I have called the pt grand-daughter back and informed her that her grandmother is due for 85m f/u appt. They have scheduled it for 10/06/22 in the AM and they will be fasting to get some lab work. This is an appointment that the granddaughter is going to try to make.   Pt's grand daughter is requesting to have a podiatry referral due to the difficulties that she is having with her feet. Please advise if we are able to do the referral prior to the office visit in November.

## 2022-09-15 NOTE — Telephone Encounter (Signed)
Pt's granddaughter is requesting a podiatry referral. She stated she has already reached out to [facility] but they have not answered her. She is hoping to be able to do this w/o a visit w/ pcp as it is hard to get her here. She has constant swelling in her feet and this makes it difficult to cut here toenails and there may be an infection under one of nails. Please advise.

## 2022-09-16 ENCOUNTER — Other Ambulatory Visit: Payer: Self-pay | Admitting: Family

## 2022-09-16 DIAGNOSIS — L602 Onychogryphosis: Secondary | ICD-10-CM

## 2022-09-17 ENCOUNTER — Encounter: Payer: Self-pay | Admitting: Podiatry

## 2022-09-17 ENCOUNTER — Ambulatory Visit: Payer: Medicare Other | Admitting: Podiatry

## 2022-09-17 DIAGNOSIS — M79675 Pain in left toe(s): Secondary | ICD-10-CM

## 2022-09-17 DIAGNOSIS — L03032 Cellulitis of left toe: Secondary | ICD-10-CM

## 2022-09-17 DIAGNOSIS — M79674 Pain in right toe(s): Secondary | ICD-10-CM | POA: Diagnosis not present

## 2022-09-17 DIAGNOSIS — B351 Tinea unguium: Secondary | ICD-10-CM | POA: Diagnosis not present

## 2022-09-17 DIAGNOSIS — L600 Ingrowing nail: Secondary | ICD-10-CM

## 2022-09-17 NOTE — Patient Instructions (Signed)

## 2022-09-18 NOTE — Progress Notes (Signed)
Subjective:   Patient ID: Brandi Walsh, female   DOB: 76 y.o.   MRN: [id number]   HPI Patient presents with caregiver with concerns about redness and pain of the left hallux nail and generalized elongated nailbeds 1-5 both feet that she cannot take care of.  They are not able to take care of her as far as this condition goes currently and patient does not smoke tries to be active   Review of Systems  All other systems reviewed and are negative.       Objective:  Physical Exam Vitals and nursing note reviewed.  Constitutional:      Appearance: She is well-developed.  Pulmonary:     Effort: Pulmonary effort is normal.  Musculoskeletal:        General: Normal range of motion.  Skin:    General: Skin is warm.  Neurological:     Mental Status: She is alert.     Vascular status moderately diminished with diminished sharp dull vibratory also noted.  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  Using sterile instrumentation I debrided nailbeds 1-5 both feet and for the left I carefully debrided out the corners and flush these areas and applied sterile dressing to both big toes.  May require more aggressive treatment if symptoms persist and I explained what they need to look for

## 2022-09-23 ENCOUNTER — Other Ambulatory Visit: Payer: Self-pay | Admitting: Family

## 2022-09-28 ENCOUNTER — Encounter: Payer: Self-pay | Admitting: Podiatry

## 2022-09-28 ENCOUNTER — Other Ambulatory Visit: Payer: Self-pay | Admitting: Family

## 2022-09-28 ENCOUNTER — Ambulatory Visit: Payer: Medicare Other | Admitting: Podiatry

## 2022-09-28 DIAGNOSIS — L03032 Cellulitis of left toe: Secondary | ICD-10-CM | POA: Diagnosis not present

## 2022-09-28 MED ORDER — DOXYCYCLINE HYCLATE 100 MG PO TABS: 100.0000 mg | ORAL_TABLET | Freq: Two times a day (BID) | ORAL | 0 refills | Status: DC

## 2022-09-28 MED ORDER — MUPIROCIN CALCIUM 2 % EX CREA: 1.0000 | TOPICAL_CREAM | Freq: Two times a day (BID) | CUTANEOUS | 0 refills | Status: DC

## 2022-09-28 NOTE — Patient Instructions (Signed)

## 2022-09-28 NOTE — Telephone Encounter (Signed)
Noted, thanks!

## 2022-09-29 NOTE — Progress Notes (Signed)
Subjective:   Patient ID: Brandi Walsh, female   DOB: 76 y.o.   MRN: [id number]   HPI Patient presents with caregiver concerned because there is been some redness on the side of the toe with no active pain or drainage noted   ROS      Objective:  Physical Exam  Low-grade redness around the left big toe nail that is localized with no proximal edema erythema drainage and hard to communicate with patient     Assessment:  Possibility for low-grade paronychia infection or other pathology secondary to nail structure that appears localized     Plan:  H&P discussed condition with her granddaughter and I am going to start her on doxycycline soaks and I did debride out a little bit the nailbed.  No active drainage was noted patient will be seen back as needed

## 2022-10-06 ENCOUNTER — Encounter: Payer: Self-pay | Admitting: Physician Assistant

## 2022-10-06 ENCOUNTER — Encounter: Payer: Self-pay | Admitting: Family

## 2022-10-06 ENCOUNTER — Ambulatory Visit (INDEPENDENT_AMBULATORY_CARE_PROVIDER_SITE_OTHER): Payer: Medicare Other | Admitting: Family

## 2022-10-06 ENCOUNTER — Ambulatory Visit: Payer: Medicare Other | Admitting: Neurology

## 2022-10-06 ENCOUNTER — Telehealth: Payer: Self-pay

## 2022-10-06 VITALS — BP 136/80 | HR 91 | Temp 98.6°F | Ht 62.5 in | Wt 152.0 lb

## 2022-10-06 DIAGNOSIS — R7309 Other abnormal glucose: Secondary | ICD-10-CM

## 2022-10-06 DIAGNOSIS — Z23 Encounter for immunization: Secondary | ICD-10-CM | POA: Diagnosis not present

## 2022-10-06 DIAGNOSIS — I10 Essential (primary) hypertension: Secondary | ICD-10-CM | POA: Diagnosis not present

## 2022-10-06 DIAGNOSIS — E785 Hyperlipidemia, unspecified: Secondary | ICD-10-CM | POA: Diagnosis not present

## 2022-10-06 LAB — CBC WITH DIFFERENTIAL/PLATELET
Basophils Absolute: 0.1 10*3/uL (ref 0.0–0.1)
Basophils Relative: 0.7 % (ref 0.0–3.0)
Eosinophils Absolute: 0.2 10*3/uL (ref 0.0–0.7)
Eosinophils Relative: 2.1 % (ref 0.0–5.0)
HCT: 38.3 % (ref 36.0–46.0)
Hemoglobin: 12.6 g/dL (ref 12.0–15.0)
Lymphocytes Relative: 18.9 % (ref 12.0–46.0)
Lymphs Abs: 1.7 10*3/uL (ref 0.7–4.0)
MCHC: 32.9 g/dL (ref 30.0–36.0)
MCV: 81.5 fl (ref 78.0–100.0)
Monocytes Absolute: 0.6 10*3/uL (ref 0.1–1.0)
Monocytes Relative: 6.5 % (ref 3.0–12.0)
Neutro Abs: 6.4 10*3/uL (ref 1.4–7.7)
Neutrophils Relative %: 71.8 % (ref 43.0–77.0)
Platelets: 209 10*3/uL (ref 150.0–400.0)
RBC: 4.69 Mil/uL (ref 3.87–5.11)
RDW: 14.4 % (ref 11.5–15.5)
WBC: 8.9 10*3/uL (ref 4.0–10.5)

## 2022-10-06 LAB — LIPID PANEL
Cholesterol: 88 mg/dL (ref 0–200)
HDL: 28.5 mg/dL — ABNORMAL LOW (ref >39.00)
LDL Cholesterol: 39 mg/dL (ref 0–99)
NonHDL: 59.39
Total CHOL/HDL Ratio: 3
Triglycerides: 103 mg/dL (ref 0.0–149.0)
VLDL: 20.6 mg/dL (ref 0.0–40.0)

## 2022-10-06 LAB — COMPREHENSIVE METABOLIC PANEL
ALT: 21 U/L (ref 0–35)
AST: 23 U/L (ref 0–37)
Albumin: 3.5 g/dL (ref 3.5–5.2)
Alkaline Phosphatase: 74 U/L (ref 39–117)
BUN: 14 mg/dL (ref 6–23)
CO2: 25 mEq/L (ref 19–32)
Calcium: 9.7 mg/dL (ref 8.4–10.5)
Chloride: 108 mEq/L (ref 96–112)
Creatinine, Ser: 0.73 mg/dL (ref 0.40–1.20)
GFR: 79.68 mL/min (ref >60.00)
Glucose, Bld: 133 mg/dL — ABNORMAL HIGH (ref 70–99)
Potassium: 3.8 mEq/L (ref 3.5–5.1)
Sodium: 139 mEq/L (ref 135–145)
Total Bilirubin: 1 mg/dL (ref 0.2–1.2)
Total Protein: 6 g/dL (ref 6.0–8.3)

## 2022-10-06 LAB — HEMOGLOBIN A1C: Hgb A1c MFr Bld: 7.2 % — ABNORMAL HIGH (ref 4.6–6.5)

## 2022-10-06 MED ORDER — ROSUVASTATIN CALCIUM 40 MG PO TABS: 40.0000 mg | ORAL_TABLET | Freq: Every day | ORAL | 3 refills | Status: DC

## 2022-10-06 MED ORDER — LOSARTAN POTASSIUM 50 MG PO TABS: 50.0000 mg | ORAL_TABLET | Freq: Every day | ORAL | 3 refills | Status: DC

## 2022-10-06 NOTE — Telephone Encounter (Signed)
I have called pt's grand daughter back and informed her of providers message. They will get the labs done with Endocrine since they have an appointment coming.

## 2022-10-06 NOTE — Progress Notes (Signed)
Brandi Walsh is a 76 y.o. 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     Cholecalciferol (VITAMIN D) 50 MCG (2000 UT) tablet Take 2,000 Units by mouth daily.     donepezil (ARICEPT) 10 MG tablet Take 1 tablet daily 90 tablet 3   escitalopram (LEXAPRO) 10 MG tablet TAKE 1 & 1/2 (ONE & ONE-HALF) TABLETS BY MOUTH ONCE DAILY 135 tablet 3   losartan (COZAAR) 50 MG tablet Take 1 tablet (50 mg total) by mouth daily. 90 tablet 3   meloxicam (MOBIC) 15 MG tablet Take 1 tablet (15 mg total) by mouth daily. 90 tablet 0   mupirocin cream (BACTROBAN) 2 % Apply 1 Application topically 2 (two) times daily. 15 g 0   omeprazole (PRILOSEC) 20 MG capsule Take 1 capsule (20 mg total) by mouth daily. 90 capsule 5   rosuvastatin (CRESTOR) 40 MG tablet Take 1 tablet (40 mg total) by mouth daily. 90 tablet 3   No current facility-administered medications for this visit.    Allergies: Lovastatin  Past Medical History:  Diagnosis Date   Alzheimer's disease ([place])    COUGH DUE TO ACE INHIBITORS 11/25/2009   Qualifier: Diagnosis of  By: [name] MD, Sean A    Depression     DIABETES MELLITUS, TYPE II 10/13/2007   Qualifier: Diagnosis of  By: Marca Ancona RMA, Lucy     HYPERLIPIDEMIA 10/13/2007   Qualifier: Diagnosis of  By: Reatha Armour, [place]     HYPERTENSION 10/13/2007   Qualifier: Diagnosis of  By: Larose Kells     MENOPAUSAL SYNDROME 10/13/2007   Qualifier: Diagnosis of  By: Marca Ancona RMA, Lucy     OSTEOPOROSIS 10/13/2007   Qualifier: Diagnosis of  By: Larose Kells      Past Surgical History:  Procedure Laterality Date   ABDOMINAL HYSTERECTOMY     BREAST BIOPSY  08/2002   TONSILLECTOMY      Family History  Problem Relation Age of Onset   Anxiety disorder Mother    Hyperparathyroidism Sister    Cancer Neg Hx    Breast cancer Neg Hx     Social History   Tobacco Use   Smoking status: Never   Smokeless tobacco: Never  Substance Use Topics   Alcohol use: No    Subjective:  Accompanied by husband; granddaughter is on phone who helps provide history;  6 month follow up on chronic care needs- no specific concerns; seeing neurology and endocrinology regularly;  Would like flu shot;   Objective:  Vitals:   10/06/22 0815  BP: 136/80  Pulse: 91  Temp: 98.6 F (37 C)  TempSrc: Oral  SpO2: 98%  Weight: 152 lb (68.9 kg)  Height: 5' 2.5" (1.588 m)    General: Well developed, well nourished, in no acute distress  Skin : Warm and dry.  Head: Normocephalic and atraumatic  Eyes: Sclera and conjunctiva clear; pupils round and reactive to light; extraocular movements intact  Ears: External normal; canals clear; tympanic membranes normal  Oropharynx: Pink, supple. No suspicious lesions  Neck: Supple without thyromegaly, adenopathy  Lungs: Respirations unlabored; clear to auscultation bilaterally without wheeze, rales, rhonchi  CVS exam: normal rate and regular rhythm.  Neurologic: Alert and oriented; speech intact; face symmetrical; moves all extremities well; CNII-XII intact without focal deficit   Assessment:  1. Elevated glucose   2. Primary  hypertension   3. Hyperlipidemia, unspecified hyperlipidemia type   4. Need for immunization against influenza     Plan:  Update labs today; Stable; refill updated; Stable; refill updated; Flu shot updated;   Plan for 6 month follow up, sooner prn.   No follow-ups on file.  Orders Placed This Encounter  Procedures   Flu Vaccine QUAD High Dose(Fluad)   Comp Met (CMET)   Hemoglobin A1c   CBC with Differential/Platelet   Lipid panel    Requested Prescriptions   Signed Prescriptions Disp Refills   losartan (COZAAR) 50 MG tablet 90 tablet 3    Sig: Take 1 tablet (50 mg total) by mouth daily.   rosuvastatin (CRESTOR) 40 MG tablet 90 tablet 3    Sig: Take 1 tablet (40 mg total) by mouth daily.

## 2022-10-06 NOTE — Telephone Encounter (Signed)
I have received a call from the pt granddaughter. She was asking if Mickel Baas can add on labs for Endo. I have informed Asencion Partridge that I will ask the provider and we can go from there.  Labs that are being requested are   -Vit D -TSH  -PTH  with cal intact   Per Mickel Baas   In general once you see endocrinology they mange their own labs. Sorry for the misunderstanding and unfortunately one of the labs that are requested are unable to be added on.   I have given the pt's granddaughter Asencion Partridge a call and there was no answer so I left a message to call back.

## 2022-10-08 ENCOUNTER — Ambulatory Visit: Payer: Medicare Other | Admitting: Physician Assistant

## 2022-10-08 NOTE — Progress Notes (Incomplete)
Assessment/Plan:   Mild Cognitive Impairment   Brandi Walsh is a very pleasant 76 y.o. 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 MRI brain showed bilateral hippocampal atrophy.  presenting today in follow-up for evaluation of memory loss. Patient is on donepezil 10 mg daily.      Recommendations:   Follow up in   months. Continue PT/OT for mobility and strength Continue to control mood as per Psychiatry.      Subjective:   This patient is accompanied in the office by her husband  who supplements the history. Previous records as well as any outside records available were reviewed prior to todays visit.  *** Last seen on 04/07/22.   We do wonder if she could have had a stroke along the way? I don't understand medicines to cause such an improvement, outside of the decreased anxiety, so I wonder if stroke is a possible explanation. Of course, I don't think her treatment changes, we are just curious to understand the improvement. And we are not interested in additional or immediate imaging outside of physician recommendations to investigate this. We are just interested in a professional opinion.    I appreciate any response you have. Please reply here, as we do use discretion in communicating some of the more sensitive aspects of her care. I am her POA and primarily manage her care needs and then discuss with family as needed or as they communicate with me. This is a question that's come up a few times.   Any changes in memory since last visit? 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 She does not do any crosswords.  She has an aide 3 times a week  repeats oneself?  Endorsed Disoriented when walking into a  room?  Patient denies   Leaving objects in unusual places?  Patient denies   Ambulates  with difficulty?   Patient denies   Recent falls?  Patient denies   Any head injuries?  Patient denies   History of seizures?   Patient denies   Wandering behavior?  Patient denies   Patient drives?  *** Any mood changes since last visit?  Patient denies   Any worsening depression?:  Patient denies   Hallucinations?  Patient denies   Paranoia?  Patient denies   Patient reports that sleeps well without vivid dreams, REM behavior or sleepwalking   History of sleep apnea?  Patient denies   Any hygiene concerns?  Patient denies   Independent of bathing and dressing?  Husband helps her get dressed Does the patient needs help with medications? Husband in charge, daughter prepares the pillbox. *** Who is in charge of the finances?  Family is in charge . Daughter is her POA and manages her care needs.  *** Any changes in appetite?  Patient denies ***   Patient have trouble swallowing? Patient denies   Does the patient cook?  Patient denies   Any kitchen accidents such as leaving the stove on? Patient denies   Any headaches?  Patient denies   Double vision? Patient denies   Any focal numbness or tingling?  Patient denies   Chronic back pain Patient denies   Unilateral weakness?  Patient denies   Any tremors?  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  Patient denies   Any incontinence of urine?  Patient denies   Any bowel dysfunction?   Patient denies      Patient lives  ***       History on Initial Assessment 06/25/2020: This is a 77 year old left-handed woman with a history of hypertension, hyperlipidemia, depression, anxiety, presenting for evaluation of recently diagnosed dementia on Neuropsychological testing done last 01/2020. Records reviewed. She reports her memory may not be so great. Her granddaughter Brandi Walsh  provides additional information. Brandi Walsh reports an incident in December 2018 when she had visual and auditory hallucinations and was admitted to [facility] and started on psychiatric medication. She had Neuropsychological testing in April 2018 with Dr. 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 Cognitive testing indicated possible very mild global attenuation of cognitive function relative to estimated premorbid intellectual abilities, with the only area of true impairment being phonemic verbal fluency and memory retrieval afor newly learned auditory information, probable mild frontal-subcortical dysfunction. Thought process was still mildly disorganized, unclear if longstanding and/or related to primary psychiatric disorder. She was no longer having delusions at that time. Brandi Walsh reports that since then, there has been a steady decline with her memory. Psychiatric disorder had been stable for 2 years with adjustment in Lexapro and Olanzapine. Over the past 6 months, Brandi Walsh noticed a biggest turning point in her cognition. She also noticed slower movements. She is overall more anxious. Her psychiatrist has advised monitoring for tardive dyskinesia, her lower jaw movements have progressively worsened. Brandi Walsh was not sure about medication adherence, so she has been helping the last few weeks. She noted waxing and waning symptoms and requested repeat Neuropsychological testing. Records from Dr. 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 Cholinesterase inhibitor was recommended, she was started on Donepezil 5mg  daily by her  Psychiatrist, no side effects.    She lives with her husband. She states her memory "may not be so great." reports that she has always been very sharp, so the slightest things she does not remember would bother her. She denies getting lost driving but drives rarely, around once a month. She and her husband do bills together, there may be some missed bills. Her husband manages meals. She is slow to answer questions but answers appropriately and thoughtfully. She has some dizziness in the morning and endorses vertigo when lying flat. She has some blurred vision. She has had some pain between her shoulder pain for a few weeks. She reports numbness in both calves. She has had bilateral leg swelling even before Donepezil was started. She has occasional constipation. She has occasional hand tremors when anxious. No headaches, dysarthria/dysphagia, focal weakness, anosmia. Sleep is okay. 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 She reports mood is good. Brandi Walsh notes she worries/stresses too much. Everyday is a little different with how she feels, how quickly she is moving. Sometimes she is a bit more agitated and does not want to eat, saying her partials don't fit well or her things get stuck (she is s/p esophageal dilatation). Brandi Walsh notes she has always been thoughtful but not as slow. She has always been  very precise and detail-oriented, which is not changed, but she is not usually as slow. No visual/auditory hallucinations. She is independent with dressing and bathing but gets stuck sometimes putting on clothes. If she gets stuck in a thought, she freezes and cannot move. No recent changes in psychiatric medication, a year ago Lexapro was increased to 10mg  to increase motivation, which has not helped much. notes there was more lucidity to her surroundings at that time, which is not present now. No family history of dementia, no history of concussions or alcohol use.   Diagnostic Data: MRI  brain with and without contrast done 06/2020 did not show any acute changes. There was mild diffuse atrophy, bilateral hippocampal atrophy, mild chronic microvascular disease.     Past Medical History:  Diagnosis Date   Alzheimer's disease (HCC)    COUGH DUE TO ACE INHIBITORS 11/25/2009   Qualifier: Diagnosis of  By: 11/27/2009 MD, Sean A    Depression    DIABETES MELLITUS, TYPE II 10/13/2007   Qualifier: Diagnosis of  By: 10/15/2007 RMA, Lucy     HYPERLIPIDEMIA 10/13/2007   Qualifier: Diagnosis of  By: 10/15/2007, Lucy     HYPERTENSION 10/13/2007   Qualifier: Diagnosis of  By: 10/15/2007, Lucy     MENOPAUSAL SYNDROME 10/13/2007   Qualifier: Diagnosis of  By: 10/15/2007 RMA, Lucy     OSTEOPOROSIS 10/13/2007   Qualifier: Diagnosis of  By: 10/15/2007       Past Surgical History:  Procedure Laterality Date   ABDOMINAL HYSTERECTOMY     BREAST BIOPSY  08/2002   TONSILLECTOMY       PREVIOUS MEDICATIONS:   CURRENT MEDICATIONS:  Outpatient Encounter Medications as of 10/08/2022  Medication Sig   alendronate (FOSAMAX) 70 MG tablet Take 70 mg by mouth once a week.   Cholecalciferol (VITAMIN D) 50 MCG (2000 UT) tablet Take 2,000 Units by mouth daily.   donepezil (ARICEPT) 10 MG tablet Take 1 tablet daily   escitalopram (LEXAPRO) 10 MG tablet TAKE 1 & 1/2 (ONE & ONE-HALF) TABLETS BY MOUTH ONCE DAILY   losartan (COZAAR) 50 MG tablet Take 1 tablet (50 mg total) by mouth daily.   meloxicam (MOBIC) 15 MG tablet Take 1 tablet (15 mg total) by mouth daily.   mupirocin cream (BACTROBAN) 2 % Apply 1 Application topically 2 (two) times daily.   omeprazole (PRILOSEC) 20 MG capsule Take 1 capsule (20 mg total) by mouth daily.   rosuvastatin (CRESTOR) 40 MG tablet Take 1 tablet (40 mg total) by mouth daily.   No facility-administered encounter medications on file as of 10/08/2022.     Objective:     PHYSICAL EXAMINATION:    VITALS:  There were no vitals filed for this visit.  GEN:  The patient  appears stated age and is in NAD. HEENT:  Normocephalic, atraumatic.   Neurological examination:  General: NAD, well-groomed, appears stated age. Orientation: The patient is alert. Oriented to person, place and date Cranial nerves: There is good facial symmetry.The speech is fluent and clear. No aphasia or dysarthria. Fund of knowledge is appropriate. Recent memory impaired and remote memory is normal.  Attention and concentration are normal.  Able to name objects and repeat phrases.  Hearing is intact to conversational tone.    Sensation: Sensation is intact to light touch throughout Motor: Strength is at least antigravity x4. Tremors: none *** DTR's 2/4 in UE/LE       No data to display  04/07/2022   11:00 AM 01/27/2021    9:00 AM  MMSE - Mini Mental State Exam  Orientation to time 5 2  Orientation to Place 5 5  Registration 3 3  Attention/ Calculation 5 3  Recall 2 0  Language- name 2 objects 2 2  Language- repeat 1 1  Language- follow 3 step command 3 3  Language- read & follow direction 1 1  Write a sentence 1 0  Copy design 0 0  Total score 28 20       Movement examination: Tone: There is normal tone in the UE/LE. Mild cogwheeling.*** Abnormal movements: No myoclonus.  No asterixis.   Coordination:  There is no decremation with RAM's. Normal finger to nose  Gait and Station: The patient has no difficulty arising out of a deep-seated chair without the use of the hands. No arm swing. The patient's stride length is good.  Gait is cautious and narrow. Bradykinesia noted.  Thank you for allowing [place] the opportunity to participate in the care of this nice patient. Please do not hesitate to contact us for any questions or concerns.   Total time spent on today's visit was *** minutes dedicated to this patient today, preparing to see patient, examining the patient, ordering tests and/or medications and counseling the patient, documenting clinical information in the  EHR or other health record, independently interpreting results and communicating results to the patient/family, discussing treatment and goals, answering patient's questions and coordinating care.  Cc:  [name], FNP  Marlowe Kays 10/08/2022 6:29 AM

## 2022-10-12 ENCOUNTER — Encounter: Payer: Self-pay | Admitting: Family

## 2022-10-29 ENCOUNTER — Other Ambulatory Visit: Payer: Medicare Other | Admitting: Internal Medicine

## 2022-11-12 ENCOUNTER — Encounter: Payer: Self-pay | Admitting: Physician Assistant

## 2022-11-12 ENCOUNTER — Ambulatory Visit: Payer: Medicare Other | Admitting: Physician Assistant

## 2022-11-12 VITALS — BP 141/83 | HR 93 | Resp 20 | Ht 62.5 in | Wt 148.0 lb

## 2022-11-12 DIAGNOSIS — R413 Other amnesia: Secondary | ICD-10-CM

## 2022-11-12 DIAGNOSIS — G20C Parkinsonism, unspecified: Secondary | ICD-10-CM | POA: Diagnosis not present

## 2022-11-12 MED ORDER — DONEPEZIL HCL 10 MG PO TABS: ORAL_TABLET | ORAL | 3 refills | Status: DC

## 2022-11-12 NOTE — Progress Notes (Signed)
Assessment/Plan:   Mild Cognitive Impairment   Brandi Walsh is a very pleasant 76 y.o. 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  Prior MRI of the brain in August 2021 personally reviewed was remarkable for bilateral hypoccampal atrophy, mild chronic microvascular disease and mild diffuse atrophy.  She is on donepezil 10 mg daily, tolerating well.  Today's MMSE is 27/30, with noted decrease delayed recall.  Repeat Neuropsych evaluation is indicated, for clarity of the diagnosis and disease progression.  Her known tremors are stable, there is no worsening of the disease.    Recommendations:   Follow up in 6  months. Continue donepezil 10 mg daily, side effects discussed  Continue to manage mood as outpatient with [name] with [facility]ica [facility]  Repeat Neuropsych testing for clarity of diagnosis and disease trajectory   Tremors are stable, no medications i.e. Sinemet are indicated at this time Patient to resume PT soon for strength and balance, as per PCP Continue to control cardiovascular risk factors    Subjective:   This patient is accompanied in the office by her granddaughter who supplements the history. Previous records as well as any outside records available were reviewed prior to todays visit.  She was last seen on 04/07/2022 at which time her MMSE was 28/30.  She is on donepezil 10 mg daily.  For essential tremor, she is not on any medications at this time.    Any changes in memory since last visit?  "Memory id good, maybe better, STM is not perfect but probably better, LTM is good ".  Her granddaughter reports that she is able to retain recent conversations and names of people.  She enjoys Location manager[employer]el of Fortune and Jeopardy most nights.  Her readings are usually  short.  Per granddaughter's report, her 76-year-old great granddaughter has been reading her some books. repeats oneself?  "She is not as verbal as she used to, and although not very often, she may asked the same question ".   Disoriented when walking into a room?  Occasionally she says "this is not my house!" or for example, today she told her granddaughter  "is this the right place?"  Referring to our office Leaving objects in unusual places?  "Grandfather manages day to day so she does not have the opportunity to" Ambulates  with difficulty?   She has decreased ambulation, "she is more stiff in the morning, she may be slower than before". She has done PT in the which was helpful, but none over 1 y ago, she wants to restart soon. Recent falls?  Patient denies   Any head injuries?  Patient denies   History of seizures?   Patient denies   Wandering behavior?  Patient denies   Patient drives?  No longer drives Any mood changes since last visit?  Patient denies.  She follows with psychiatry as an outpatient. Any worsening depression?:  Patient denies   Hallucinations?  Patient denies   Paranoia?  Patient denies   Patient reports that sleeps well without vivid dreams, REM behavior or sleepwalking   History of sleep apnea?  Patient denies   Any hygiene concerns?  Patient denies   Independent of bathing and dressing?  She needs assistance because of balance Does the patient needs help with medications?  Granddaughter is charge  Who  is in charge of the finances? Husband is in charge, and granddaughter assists Any changes in appetite?  Patient denies    Patient have trouble swallowing?   "2 wk ago she spit up some mucus, 3 spells each time, and then  it resolved".   Does the patient cook?  Patient denies   Any kitchen accidents such as leaving the stove on? Patient denies   Any headaches?  Patient denies   Double vision? Patient denies   Any focal numbness or tingling?  Patient denies   Chronic  back pain Patient denies   Unilateral weakness?  Patient denies   Any tremors?  She has a history of parkinsonism with bradykinesia and mild dyskinetic movements around her jaw as per prior visit, likely from psych medications. Tremors in her hands are down or gone. "She has a new habit, keeps her right hand in the head" Any history of anosmia?  Patient denies   Any incontinence of urine?  Endorsed, wears diapers Any bowel dysfunction?   Patient denies      Patient lives husband and grandaughter and children who moved recently with her   History on Initial Assessment 06/25/2020: This is a 76 year old left-handed woman with a history of hypertension, hyperlipidemia, depression, anxiety, presenting for evaluation of recently diagnosed dementia on Neuropsychological testing done last 01/2020. Records reviewed. She reports her memory may not be so great. Her granddaughter [name] provides additional information. [name] reports an incident in December 2018 when she had visual and auditory hallucinations and was admitted to [facility] and started on psychiatric medication. She had Neuropsychological testing in April 2018 with Dr. 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 Cognitive testing indicated possible very mild global attenuation of cognitive function relative to estimated premorbid intellectual abilities, with the only area of true impairment being phonemic verbal fluency and memory retrieval afor newly learned auditory information, probable mild frontal-subcortical dysfunction. Thought process was still mildly disorganized, unclear if longstanding and/or related to primary psychiatric disorder. She was no longer having delusions at that time. [name] reports that since then, there has been a steady decline with her memory. Psychiatric disorder had been stable for 2 years with adjustment in Lexapro and  Olanzapine. Over the past 6 months, [name] noticed a biggest turning point in her cognition. She also noticed slower movements. She is overall more anxious. Her psychiatrist has advised monitoring for tardive dyskinesia, her lower jaw movements have progressively worsened. [name] was not sure about medication adherence, so she has been helping the last few weeks. She noted waxing and waning symptoms and requested repeat Neuropsychological testing. Records from Dr. 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 Cholinesterase inhibitor was recommended, she was started on Donepezil 5mg  daily by her Psychiatrist, no side effects.    She lives with her husband. She states her memory "may not be so great." reports that she has always been very sharp, so the slightest things she does not remember would bother her. She denies getting lost driving but drives rarely, around once a month. She and her husband do bills together, there may be some missed bills. Her husband manages meals. She is slow to answer questions but answers appropriately and thoughtfully. She  has some dizziness in the morning and endorses vertigo when lying flat. She has some blurred vision. She has had some pain between her shoulder pain for a few weeks. She reports numbness in both calves. She has had bilateral leg swelling even before Donepezil was started. She has occasional constipation. She has occasional hand tremors when anxious. No headaches, dysarthria/dysphagia, focal weakness, anosmia. Sleep is okay. 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 She reports mood is good. [name] notes she worries/stresses too much. Everyday is a little different with  how she feels, how quickly she is moving. Sometimes she is a bit more agitated and does not want to eat, saying her partials don't fit well or her things get stuck (she is s/p esophageal dilatation). [name] notes she has always been thoughtful but not as slow. She has always been very precise and detail-oriented, which is not changed, but she is not usually as slow. No visual/auditory hallucinations. She is independent with dressing and bathing but gets stuck sometimes putting on clothes. If she gets stuck in a thought, she freezes and cannot move. No recent changes in psychiatric medication, a year ago Lexapro was increased to 10mg  to increase motivation, which has not helped much. notes there was more lucidity to her surroundings at that time, which is not present now. No family history of dementia, no history of concussions or alcohol use.   Diagnostic Data: MRI brain with and without contrast done 06/2020 did not show any acute changes. There was mild diffuse atrophy, bilateral hippocampal atrophy, mild chronic microvascular disease.    Past Medical History:  Diagnosis Date   Alzheimer's disease (HCC)    COUGH DUE TO ACE INHIBITORS 11/25/2009   Qualifier: Diagnosis of  By: 11/27/2009 MD, Sean A    Depression    DIABETES MELLITUS, TYPE II 10/13/2007   Qualifier: Diagnosis of  By: 10/15/2007 RMA, Lucy     HYPERLIPIDEMIA 10/13/2007   Qualifier: Diagnosis of  By: 10/15/2007, Lucy     HYPERTENSION 10/13/2007   Qualifier: Diagnosis of  By: 10/15/2007, Lucy     MENOPAUSAL SYNDROME 10/13/2007   Qualifier: Diagnosis of  By: 10/15/2007 RMA, Lucy     OSTEOPOROSIS 10/13/2007   Qualifier: Diagnosis of  By: 10/15/2007       Past Surgical History:  Procedure Laterality Date   ABDOMINAL HYSTERECTOMY     BREAST BIOPSY  08/2002   TONSILLECTOMY       PREVIOUS MEDICATIONS:   CURRENT MEDICATIONS:  Outpatient Encounter Medications as of 11/12/2022  Medication Sig   alendronate (FOSAMAX) 70 MG tablet  Take 70 mg by mouth once a week.   Cholecalciferol (VITAMIN D) 50 MCG (2000 UT) tablet Take 2,000 Units by mouth daily.   donepezil (ARICEPT) 10 MG tablet Take 1 tablet daily   escitalopram (LEXAPRO) 10 MG tablet TAKE 1 & 1/2 (ONE & ONE-HALF) TABLETS BY MOUTH ONCE DAILY   losartan (COZAAR) 50 MG tablet Take 1 tablet (50 mg total) by mouth daily.   meloxicam (MOBIC) 15 MG tablet Take 1 tablet (15 mg total) by mouth daily.   mupirocin cream (BACTROBAN) 2 % Apply 1 Application topically 2 (two) times daily.   omeprazole (PRILOSEC) 20 MG capsule Take 1 capsule (20 mg total) by mouth daily.   rosuvastatin (CRESTOR) 40 MG tablet Take 1 tablet (40 mg total) by mouth daily.   No facility-administered encounter medications on file as of 11/12/2022.     Objective:  PHYSICAL EXAMINATION:    VITALS:   Vitals:   11/12/22 0857  BP: (!) 141/83  Pulse: 93  Resp: 20  SpO2: 99%  Weight: 148 lb (67.1 kg)  Height: 5' 2.5" (1.588 m)    GEN:  The patient appears stated age and is in NAD. HEENT:  Normocephalic, atraumatic.   Neurological examination:  General: NAD, well-groomed, appears stated age. Orientation: The patient is alert. Oriented to person, place and date Cranial nerves: There is good facial symmetry, mild hypomimia is noted.The speech is fluent and clear. No aphasia or dysarthria. Fund of knowledge is appropriate. Recent memory impaired and remote memory is normal.  Attention and concentration are normal.  Able to name objects and repeat phrases.  Hearing is intact to conversational tone.    Sensation: Sensation is intact to light touch throughout Motor: Strength is at least antigravity x4. DTR's 2/4 in UE/LE       No data to display             04/07/2022   11:00 AM 01/27/2021    9:00 AM  MMSE - Mini Mental State Exam  Orientation to time 5 2  Orientation to Place 5 5  Registration 3 3  Attention/ Calculation 5 3  Recall 2 0  Language- name 2 objects 2 2  Language-  repeat 1 1  Language- follow 3 step command 3 3  Language- read & follow direction 1 1  Write a sentence 1 0  Copy design 0 0  Total score 28 20       Movement examination: Tone: There is normal tone in the UE/LE.  No cogwheeling Abnormal movements: Minimal intention tremor on her left hand on pronation, known involuntary dyskinesia of the mouth.  No myoclonus.  No asterixis.   Coordination:  There is no decremation with RAM's. Normal finger to nose  Gait and Station: The patient has no difficulty arising out of a deep-seated chair without the use of the hands. The patient's stride length is short gait is cautious and narrow.   Thank you for allowing [place] the opportunity to participate in the care of this nice patient. Please do not hesitate to contact us for any questions or concerns.   Total time spent on today's visit was 36 minutes dedicated to this patient today, preparing to see patient, examining the patient, ordering tests and/or medications and counseling the patient, documenting clinical information in the EHR or other health record, independently interpreting results and communicating results to the patient/family, discussing treatment and goals, answering patient's questions and coordinating care.  Cc:  [name], FNP  Marlowe Kays 11/12/2022 9:18 AM

## 2022-11-12 NOTE — Patient Instructions (Addendum)
Good to see you!  Continue Donepezil 10mg  daily  2. Recommend increasing physical exercise and activity, consider PT  3. Continue your mood meds by Primary and Psych   4. Repeat the Neuropsych testing for clarity of diagnosis    3. Follow-up in 6 months, call for any changes  FALL PRECAUTIONS: Be cautious when walking. Scan the area for obstacles that may increase the risk of trips and falls. When getting up in the mornings, sit up at the edge of the bed for a few minutes before getting out of bed. Consider elevating the bed at the head end to avoid drop of blood pressure when getting up. Walk always in a well-lit room (use night lights in the walls). Avoid area rugs or power cords from appliances in the middle of the walkways. Use a walker or a cane if necessary and consider physical therapy for balance exercise. Get your eyesight checked regularly.   HOME SAFETY: Consider the safety of the kitchen when operating appliances like stoves, microwave oven, and blender. Consider having supervision and share cooking responsibilities until no longer able to participate in those. Accidents with firearms and other hazards in the house should be identified and addressed as well.   ABILITY TO BE LEFT ALONE: If patient is unable to contact 911 operator, consider using LifeLine, or when the need is there, arrange for someone to stay with patients. Smoking is a fire hazard, consider supervision or cessation. Risk of wandering should be assessed by caregiver and if detected at any point, supervision and safe proof recommendations should be instituted.   RECOMMENDATIONS FOR ALL PATIENTS WITH MEMORY PROBLEMS: 1. Continue to exercise (Recommend 30 minutes of walking everyday, or 3 hours every week) 2. Increase social interactions - continue going to [place] and enjoy social gatherings with friends and family 3. Eat healthy, avoid fried foods and eat more fruits and vegetables 4. Maintain adequate blood  pressure, blood sugar, and blood cholesterol level. Reducing the risk of stroke and cardiovascular disease also helps promoting better memory. 5. Avoid stressful situations. Live a simple life and avoid aggravations. Organize your time and prepare for the next day in anticipation. 6. Sleep well, avoid any interruptions of sleep and avoid any distractions in the bedroom that may interfere with adequate sleep quality 7. Avoid sugar, avoid sweets as there is a strong link between excessive sugar intake, diabetes, and cognitive impairment The Mediterranean diet has been shown to help patients reduce the risk of progressive memory disorders and reduces cardiovascular risk. This includes eating fish, eat fruits and green leafy vegetables, nuts like almonds and hazelnuts, walnuts, and also use olive oil. Avoid fast foods and fried foods as much as possible. Avoid sweets and sugar as sugar use has been linked to worsening of memory function.  There is always a concern of gradual progression of memory problems. If this is the case, then we may need to adjust level of care according to patient needs. Support, both to the patient and caregiver, should then be put into place.

## 2022-11-28 ENCOUNTER — Other Ambulatory Visit: Payer: Self-pay | Admitting: Gastroenterology

## 2022-12-02 ENCOUNTER — Encounter: Payer: Self-pay | Admitting: Psychiatry

## 2022-12-02 ENCOUNTER — Ambulatory Visit: Payer: Medicare Other | Admitting: Psychiatry

## 2022-12-02 DIAGNOSIS — F3342 Major depressive disorder, recurrent, in full remission: Secondary | ICD-10-CM | POA: Diagnosis not present

## 2022-12-02 DIAGNOSIS — F419 Anxiety disorder, unspecified: Secondary | ICD-10-CM

## 2022-12-02 MED ORDER — ESCITALOPRAM OXALATE 10 MG PO TABS: ORAL_TABLET | ORAL | 3 refills | Status: DC

## 2022-12-02 NOTE — Progress Notes (Unsigned)
Brandi Walsh [id number] 1946-05-23 77 y.o.  Subjective:   Patient ID:  Brandi Walsh is a 77 y.o. (DOB 08/08/1946) female.  Chief Complaint: No chief complaint on file.   HPI Brandi Walsh presents to the office today for follow-up of ***  "Doing great." Denies depressed mood or anxiety. Sleeping throughout the night and averages about 8 hours a night. Appetite has been "good." Husband reports that sometimes he will need to assist her with feeding. Energy and motivation have been good.She has been riding along with husband when he works with truck driving. He reports that she likes watching game shows on TV and will answer the trivia questions. Denies AH, VH, or paranoia. Denies SI.   Granddaughter just moved in with her 2 children. Family is providing all of her care at home. Granddaughter helps manage patients' medications.    [place] Office Visit from 12/02/2021 in [place] Visit from 05/06/2020 in [place] Office Visit from 08/08/2019 in [place] Total Score 4 6 1       Mini-Mental    [place] Office Visit from 11/12/2022 in [facility] [place] Visit from 04/07/2022 in [facility] [place] from 01/27/2021 in [facility]  Total Score (max 30 points ) 26 28 20       PHQ2-9    [place] Visit from 10/06/2022 in [facility] [place] Visit from 04/03/2022 in [place] [facility] [place] Visit from 09/23/2021 in [place] [facility] [place] Visit from 03/19/2021 in [place] [facility] Visit from 10/23/2020 in [place] [facility]  PHQ-2 Total Score 0 0 0 0 1  PHQ-9 Total Score -- -- -- -- 2        Review of Systems:  Review of Systems  Musculoskeletal:  Negative for gait problem.   Neurological:  Positive for tremors.  Psychiatric/Behavioral:         Please refer to HPI    Medications: I have reviewed the patient's current medications.  Current Outpatient Medications  Medication Sig Dispense Refill   alendronate (FOSAMAX) 70 MG tablet Take 70 mg by mouth once a week.     Cholecalciferol (VITAMIN D) 50 MCG (2000 UT) tablet Take 2,000 Units by mouth daily.     donepezil (ARICEPT) 10 MG tablet Take 1 tablet daily 90 tablet 3   escitalopram (LEXAPRO) 10 MG tablet TAKE 1 & 1/2 (ONE & ONE-HALF) TABLETS BY MOUTH ONCE DAILY 135 tablet 3   losartan (COZAAR) 50 MG tablet Take 1 tablet (50 mg total) by mouth daily. 90 tablet 3   meloxicam (MOBIC) 15 MG tablet Take 1 tablet (15 mg total) by mouth daily. 90 tablet 0   mupirocin cream (BACTROBAN) 2 % Apply 1 Application topically 2 (two) times daily. 15 g 0   omeprazole (PRILOSEC) 20 MG capsule Take 1 capsule by mouth once daily 30 capsule 0   rosuvastatin (CRESTOR) 40 MG tablet Take 1 tablet (40 mg total) by mouth daily. 90 tablet 3   No current facility-administered medications for this visit.    Medication Side Effects: None  Allergies:  Allergies  Allergen Reactions   Lovastatin     REACTION: Rash    Past Medical History:  Diagnosis Date   Alzheimer's disease ([place])    COUGH DUE TO ACE INHIBITORS 11/25/2009  Qualifier: Diagnosis of  By: [name] MD, Sean A    Depression    DIABETES MELLITUS, TYPE II 10/13/2007   Qualifier: Diagnosis of  By: Marca Ancona RMA, Lucy     HYPERLIPIDEMIA 10/13/2007   Qualifier: Diagnosis of  By: Reatha Armour, [place]     HYPERTENSION 10/13/2007   Qualifier: Diagnosis of  By: Larose Kells     MENOPAUSAL SYNDROME 10/13/2007   Qualifier: Diagnosis of  By: Marca Ancona RMA, Lucy     OSTEOPOROSIS 10/13/2007   Qualifier: Diagnosis of  By: Larose Kells      Past Medical History, Surgical history, Social history, and Family history were reviewed and updated as appropriate.   Please see review of  systems for further details on the patient's review from today.   Objective:   Physical Exam:  There were no vitals taken for this visit.  Physical Exam  Lab Review:     Component Value Date/Time   NA 139 10/06/2022 0859   K 3.8 10/06/2022 0859   CL 108 10/06/2022 0859   CO2 25 10/06/2022 0859   GLUCOSE 133 (H) 10/06/2022 0859   BUN 14 10/06/2022 0859   CREATININE 0.73 10/06/2022 0859   CREATININE 0.73 08/02/2020 1458   CALCIUM 9.7 10/06/2022 0859   CALCIUM 10.8 (H) 02/19/2012 1129   PROT 6.0 10/06/2022 0859   ALBUMIN 3.5 10/06/2022 0859   AST 23 10/06/2022 0859   ALT 21 10/06/2022 0859   ALKPHOS 74 10/06/2022 0859   BILITOT 1.0 10/06/2022 0859   GFRNONAA >60 07/03/2020 1732   GFRAA >60 07/03/2020 1732       Component Value Date/Time   WBC 8.9 10/06/2022 0859   RBC 4.69 10/06/2022 0859   HGB 12.6 10/06/2022 0859   HGB 15.3 12/16/2010 1045   HCT 38.3 10/06/2022 0859   HCT 43.8 12/16/2010 1045   PLT 209.0 10/06/2022 0859   PLT 224 12/16/2010 1045   MCV 81.5 10/06/2022 0859   MCV 88.9 12/16/2010 1045   MCH 32.0 07/23/2020 1010   MCHC 32.9 10/06/2022 0859   RDW 14.4 10/06/2022 0859   RDW 12.5 12/16/2010 1045   LYMPHSABS 1.7 10/06/2022 0859   LYMPHSABS 2.1 12/16/2010 1045   MONOABS 0.6 10/06/2022 0859   MONOABS 0.8 12/16/2010 1045   EOSABS 0.2 10/06/2022 0859   EOSABS 0.3 12/16/2010 1045   BASOSABS 0.1 10/06/2022 0859   BASOSABS 0.1 12/16/2010 1045    No results found for: "POCLITH", "LITHIUM"   No results found for: "PHENYTOIN", "PHENOBARB", "VALPROATE", "CBMZ"   .res Assessment: Plan:    There are no diagnoses linked to this encounter.   Please see After Visit Summary for patient specific instructions.  Future Appointments  Date Time Provider [place]  04/06/2023  8:20 AM Marrian Salvage, FNP [facility]  05/14/2023  9:00 AM Rondel Jumbo, PA-C [facility] None  07/06/2023  8:30 AM Hazle Coca, PhD [facility] None  07/06/2023  9:30 AM  Idamae Schuller- NEUROPSYCH TECH [facility] None  07/13/2023 10:00 AM Hazle Coca, PhD [facility] None    No orders of the defined types were placed in this encounter.   -------------------------------

## 2022-12-03 ENCOUNTER — Telehealth: Payer: Self-pay | Admitting: Gastroenterology

## 2022-12-03 NOTE — Telephone Encounter (Signed)
Spoke with pt's granddaughter. Pt's granddaughter stated that patient has alzheimer's and at a recent neurology appointment she remembers the neurologist saying something about at year 2 or 3 it may be time to reconsider patient's omeprazole. Pt's granddaughter could not remember what else the neurologist said. Pt's granddaughter wanted to know if it would be safe for patient to stop omeprazole or given history should she continue medication?

## 2022-12-03 NOTE — Telephone Encounter (Signed)
Spoke with pt's granddaughter, Asencion Partridge, and gave her recommendations. Asencion Partridge verbalized understanding.

## 2022-12-03 NOTE — Telephone Encounter (Signed)
Patient's granddaughter called, stated patient was not given refill for her Omeprazole medication last time it was requested. Advised her, patient would probably need an office visit if she wanted to continue the medication. She requested to have a nurse call her to inquire if it is necessary for patient to continue on her medication or if she can stop. Please advise.

## 2022-12-11 ENCOUNTER — Encounter: Payer: Self-pay | Admitting: Physician Assistant

## 2022-12-29 ENCOUNTER — Other Ambulatory Visit: Payer: Self-pay | Admitting: Family

## 2023-01-11 ENCOUNTER — Telehealth: Payer: Self-pay | Admitting: Psychiatry

## 2023-01-11 NOTE — Telephone Encounter (Signed)
Pt's grandaughter, Brandi Walsh, called at 11:15am. She mentioned that Brandi Walsh had been off of the Citalopram for three days due to being out of it ( Wed - Frid) . She restarted Sat and Sunday had a weird incident that they are not sure if it was related to the w/d of citalopram. Her husband got her up and to the bathroom Sun am and she had a very large BM and then sort of had her legs go out from under her. Got real weak and clammy. Brandi Walsh checked her BP and temp and pulse and all was normal. She seemed to be ok after that occurred.

## 2023-01-12 NOTE — Telephone Encounter (Signed)
Talked with Asencion Partridge. Patient had a 90-day supply at the pharmacy sent in January. She said she had just not picked it up from the pharmacy. Patient is back on her medication and has had no further sx.

## 2023-01-13 ENCOUNTER — Telehealth: Payer: Self-pay | Admitting: Family

## 2023-01-13 NOTE — Telephone Encounter (Signed)
Patient's granddaughter called stating the pt has been having diarrhea and she is scared her grandmother will get dehydrated. She would like for something to be sent for it. Please advise.

## 2023-01-13 NOTE — Telephone Encounter (Signed)
Spoke with Pts granddaughter, she states the diarrhea started on 01/10/2023, granddaughter was concerned maybe it had something to do with her stopping her Escitalopram. Pt had bouts of diarrhea 01/10/2023, 01/11/2023 and 01/13/2023. Diarrhea is usually once a day. Pt states no fever or abdominal pain, pt was not able to determine if there was blood in the stool.

## 2023-01-14 NOTE — Telephone Encounter (Signed)
Spoke with pts granddaughter she expressed understanding and stated if the diarrhea continue to next week Pt will come in for an OV.

## 2023-01-15 ENCOUNTER — Telehealth: Payer: Self-pay | Admitting: Physician Assistant

## 2023-01-15 NOTE — Telephone Encounter (Signed)
Called patients granddaughter and gave Sara's recommendations to hold donepezil and to monitor patient and stay in touch with PCP

## 2023-01-15 NOTE — Telephone Encounter (Signed)
Spoke to patients granddaughter Asencion Partridge. She spoke to the PCP yesterday about he diarrhea. Patients granddaughter said her reason for calling here is the sudden onset of weakness and not being able to walk and the loss of ability to control bowels. She felt these were more Neuro issues. She is just trying to narrow down what is happening to her grand mother

## 2023-01-15 NOTE — Telephone Encounter (Signed)
Pt's granddaughter called in stating the pt has been having some symptoms. The pt has been having loose stools and whenever they put her in the shower to get her clean she gets really weak and her legs give out. She would like to speak with someone about this.

## 2023-01-19 ENCOUNTER — Ambulatory Visit: Payer: Medicare Other | Admitting: Family

## 2023-01-21 ENCOUNTER — Encounter: Payer: Self-pay | Admitting: Physician Assistant

## 2023-01-21 NOTE — Telephone Encounter (Signed)
No. Hold it and will try another medication if you wish.  Did she try anything before?

## 2023-01-22 ENCOUNTER — Telehealth: Payer: Self-pay | Admitting: Psychiatry

## 2023-01-22 NOTE — Telephone Encounter (Signed)
Asencion Partridge Salimah's granddaughter called and would like to speak to [place]  about the changes she is seeing in her grandmother.  She said last week she spoke to the nurse but really wants to talk to [place] only. Her number is [phone]

## 2023-01-22 NOTE — Telephone Encounter (Signed)
Brandi Walsh said they had stopped omeprazole and recently Aricept at the suggestion of neuro. She said GM is not combative but is more resistant to direction. She thought the Lexapro would help with her motivation but it hasn't. One day patient may get up to bathe, the next she may not. Brandi Walsh said they are following with neuro, but that has been a much slower process. She said she was not able to come to the last visit and didn't know if you had noticed any changes in GM from previous visits.

## 2023-01-22 NOTE — Telephone Encounter (Signed)
Returned call to [place]. She reports that they stopped Omeprazole because neurology was concerned taking it long-term it could be affecting her memory. She reports that pt then started having some weakness and loose stools. Neurology recommended stopping Aricept and bowel movements and weakness improved. She reports that there was a lapse in Lexapro for 3 days (01/06/23, 01/07/23, and 01/08/23). Re-started Lexapro 15 mg po qd on 01/09/23. Loose stools 01/10/23, 01/11/23, and 01/13/23. Weakness episode on 01/15/23.  Now having regular stools and no further episodes of weakness.   She reports that prior to these events in January family noticed she "started having some resistance." She is not wanting to get out of bed some days and "more stubborn." Some resistance to care. She had a couple of episodes of weakness in the shower and she fell, and this may have caused pt to have some reluctance to shower.   She reports that these events seem to be related- "It feels like a Domino."  Discussed that loose stools may have been related to re-starting Lexapro 15 mg daily without titration instead of Aricept since she has been tolerating Aricept well long-term. Recommended following up with neuro re: whether to re-start Aricept or try an alternative med. Shared that there is no record of pt taking any other medications in the past for cognition. Discussed that loose stools are a common side effect with starting/re-starting SSRI's for about 5 days and then resolve. Advised granddaughter to contact office with any questions or concerns.

## 2023-01-25 ENCOUNTER — Other Ambulatory Visit: Payer: Self-pay | Admitting: Physician Assistant

## 2023-01-25 MED ORDER — DONEPEZIL HCL 5 MG PO TABS: ORAL_TABLET | ORAL | 3 refills | Status: DC

## 2023-01-26 ENCOUNTER — Telehealth: Payer: Self-pay | Admitting: Gastroenterology

## 2023-01-26 NOTE — Telephone Encounter (Signed)
Patient grand daughter Asencion Partridge call have concerns regarding pt .Marland KitchenAsencion Partridge stated patient have No bowels  moments in  3 days , decrease appetite , since patient  doesn't communicate well she dont have a way to know if she is in pain and she is schedule for a follow up till 4/16.Please advise

## 2023-01-27 NOTE — Telephone Encounter (Signed)
Returned call to patient's granddaughter Asencion Partridge. Asencion Partridge states that patient did end up having a bowel movement last night. Pt has had a decreased appetite, but she thinks its related to one of her prescribed medications. Asencion Partridge states that she has also been giving patient Pepcid every other day. Asencion Partridge has not noticed that patient has had any weight loss. Pt has been having intermittent production of phlegm, something new and not sure what it is coming from. I rescheduled patient's appt to tomorrow at 10 am. Asencion Partridge verbalized understanding and had no concerns at the end of the call.  Nabina B, CMA notified since of add-on.

## 2023-01-28 ENCOUNTER — Encounter: Payer: Self-pay | Admitting: Gastroenterology

## 2023-01-28 ENCOUNTER — Ambulatory Visit: Payer: Medicare Other | Admitting: Gastroenterology

## 2023-01-28 VITALS — BP 100/72 | HR 68 | Ht 60.0 in | Wt 145.1 lb

## 2023-01-28 DIAGNOSIS — K219 Gastro-esophageal reflux disease without esophagitis: Secondary | ICD-10-CM

## 2023-01-28 DIAGNOSIS — K5900 Constipation, unspecified: Secondary | ICD-10-CM | POA: Diagnosis not present

## 2023-01-28 DIAGNOSIS — G3184 Mild cognitive impairment, so stated: Secondary | ICD-10-CM | POA: Diagnosis not present

## 2023-01-28 DIAGNOSIS — K648 Other hemorrhoids: Secondary | ICD-10-CM | POA: Diagnosis not present

## 2023-01-28 MED ORDER — FAMOTIDINE 20 MG PO TABS: 20.0000 mg | ORAL_TABLET | Freq: Every day | ORAL | 5 refills | Status: DC

## 2023-01-28 NOTE — Progress Notes (Signed)
Chief Complaint:   GERD, constipation, change in bowel habits  GI History: 77 year old female with history of diabetes, HTN, osteoporosis, anxiety, depression, Alzheimer's disease with dementia and memory loss, hyperparathyroidism, follows in the GI clinic for the following:   1) Dysphagia, weight loss: Lost approximately 13 pounds between March 2021 and July 2021.   Weight loss mostly stemmed from not wanting to eat/not meeting goal caloric intake. -EGD (06/11/2020): Mild Schatzki's ring dilated with 20 mm TTS balloon without mucosal rent.  Empirically dilated remainder of the esophagus with 20 mm TTS balloon inflated and dragged through esophagus without mucosal change.  20 mm hyperplastic polyp in gastric antrum (biopsies benign).  Benign fundic gland polyps. -Resolution of dysphagia since EGD with empiric dilation.  -Patient has since regained her appetite and back to baseline weight   2) Elevated liver enzymes: Intermittent mild elevations of ALT> AST, less than 3 times ULN.  Hepatitis B/C negative in 08/2019. -Peak AST/ALT 47/100 in 08/2019.  Liver enzymes have since normalized. -Intermittently mildly elevated T bili, with peak 2.0, all indirect consistent with Gilbert's -Normal ALP, albumin -RUQ [place] (05/2020): Fatty liver infiltration, otherwise normal  HPI:     Patient is a 77 y.o. female presenting to the Gastroenterology Clinic for follow-up.  Was last seen by me on 08/06/2020.  Had complete resolution of dysphagia with EGD with dilation.  Presents to the office with her granddaughter.  She reports her appetite has since restored and weight back to baseline; 145# today (was 103# at time of last appoint with me 07/2020).  She has questions regarding omeprazole vs Pepcid for treatment of her reflux. Stopped taking omeprazole 12/11/2022 due to concerns about dementia and PPI. Now takes Pepcid prn. Does have episodes of seemingly increased phlegm production. No witnessed dysphagia.  Otherwise  no heartburn, regurgitation, chronic cough.   Separately, had a recent episode of constipation lasting 5 days.  Tried Miralax/Senna, then used an enema with resolution.  Has not had recurrence of symptoms. Does not drink water (drinks tea, diet soda).   Separately, history of hemorrhoids, but patient does not seem to endorse much in the way of hemorrhoidal symptoms.  Wants to make sure it is okay to use periodic topical therapy.  No plan for surgery or banding.        Latest Ref Rng & Units 10/06/2022    8:59 AM 04/03/2022    9:56 AM 09/23/2021    3:21 PM  CBC  WBC 4.0 - 10.5 K/uL 8.9  7.9  9.9   Hemoglobin 12.0 - 15.0 g/dL 12.6  13.6  13.8   Hematocrit 36.0 - 46.0 % 38.3  40.5  41.5   Platelets 150.0 - 400.0 K/uL 209.0  198.0  202.0         Latest Ref Rng & Units 10/06/2022    8:59 AM 04/03/2022    9:56 AM 09/23/2021    3:21 PM  CMP  Glucose 70 - 99 mg/dL 133  190  132   BUN 6 - 23 mg/dL '14  15  17   '$ Creatinine 0.40 - 1.20 mg/dL 0.73  0.90  0.73   Sodium 135 - 145 mEq/L 139  142  139   Potassium 3.5 - 5.1 mEq/L 3.8  3.8  4.0   Chloride 96 - 112 mEq/L 108  105  107   CO2 19 - 32 mEq/L '25  29  27   '$ Calcium 8.4 - 10.5 mg/dL 9.7  9.5    9.6  9.9    9.9   Total Protein 6.0 - 8.3 g/dL 6.0  6.2  6.3   Total Bilirubin 0.2 - 1.2 mg/dL 1.0  0.9  0.7   Alkaline Phos 39 - 117 U/L 74  78  91   AST 0 - 37 U/L 23  35  28   ALT 0 - 35 U/L 21  39  36      Review of systems:     No chest pain, no SOB, no fevers, no urinary sx   Past Medical History:  Diagnosis Date   Alzheimer's disease ([place])    COUGH DUE TO ACE INHIBITORS 11/25/2009   Qualifier: Diagnosis of  By: [name] MD, Sean A    Depression    DIABETES MELLITUS, TYPE II 10/13/2007   Qualifier: Diagnosis of  By: Larose Kells     HYPERLIPIDEMIA 10/13/2007   Qualifier: Diagnosis of  By: Larose Kells     HYPERTENSION 10/13/2007   Qualifier: Diagnosis of  By: Larose Kells     MENOPAUSAL SYNDROME 10/13/2007   Qualifier:  Diagnosis of  By: Marca Ancona RMA, [place]     OSTEOPOROSIS 10/13/2007   Qualifier: Diagnosis of  By: Larose Kells      Patient's surgical history, family medical history, social history, medications and allergies were all reviewed in Epic    Current Outpatient Medications  Medication Sig Dispense Refill   alendronate (FOSAMAX) 70 MG tablet Take 70 mg by mouth once a week.     Cholecalciferol (VITAMIN D) 50 MCG (2000 UT) tablet Take 2,000 Units by mouth daily.     donepezil (ARICEPT) 5 MG tablet Take 1 tablet daily 90 tablet 3   escitalopram (LEXAPRO) 10 MG tablet TAKE 1 & 1/2 (ONE & ONE-HALF) TABLETS BY MOUTH ONCE DAILY 135 tablet 3   famotidine (PEPCID) 20 MG tablet Take 20 mg by mouth daily as needed for heartburn or indigestion.     losartan (COZAAR) 50 MG tablet Take 1 tablet (50 mg total) by mouth daily. 90 tablet 3   meloxicam (MOBIC) 15 MG tablet Take 1 tablet by mouth once daily 90 tablet 0   mupirocin cream (BACTROBAN) 2 % Apply 1 Application topically 2 (two) times daily. 15 g 0   rosuvastatin (CRESTOR) 40 MG tablet Take 1 tablet (40 mg total) by mouth daily. 90 tablet 3   No current facility-administered medications for this visit.    Physical Exam:     BP 100/72   Pulse 68   Ht 5' (1.524 m)   Wt 145 lb 2 oz (65.8 kg)   BMI 28.34 kg/m   GENERAL:  Pleasant female in NAD PSYCH: : Cooperative, normal affect EENT:  conjunctiva pink, mucous membranes moist, neck supple without masses CARDIAC:  RRR, no murmur heard, no peripheral edema PULM: Normal respiratory effort, lungs CTA bilaterally, no wheezing ABDOMEN:  Nondistended, soft, nontender. No obvious masses, no hepatomegaly,  normal bowel sounds   IMPRESSION and PLAN:    1) GERD without esophagitis Discussed recent publications regarding safety profile of PPI, with specific regards to lack of association with dementia.  With that said, if she feels more comfortable using H2 blocker, perfectly reasonable.  Main symptom  seems to be phlegm production, which may or may not be related to reflux, and instead related to dementia/unaware that she is doing this.  We mutually arrived to the following plan: - Provided with Rx for Pepcid 20 mg.  Will start taking qod  and monitor for change in phlegm.  If no change, will trial daily therapy and titrate from there  2) Dysphagia - Resolved with previous EGD with empiric esophageal dilation  3) Internal hemorrhoids - Conservative management seems most appropriate - Ok to use Preparation H prn.  Cautioned to not use beyond 10-14 days at a time - No plan for surgery or hemorrhoid banding  4) Constipation Recent episode which eventually resolved with laxatives and enema.  Possibly related to medication adjustment at that time, and granddaughter also reports she and her husband were eating less fruit/fiber prior to that episode.  Symptoms have since resolved - Add Benefiber - Ok to use MiraLAX, etc. as needed - Ok to use enema if several days without BM  RTC prn           Lavena Bullion ,DO, FACG 01/28/2023, 10:16 AM

## 2023-01-28 NOTE — Patient Instructions (Addendum)
We have sent the following medications to your pharmacy for you to pick up at your convenience: Pepcid 20 MG daily  Follow up as needed. _______________________________________________________  If your blood pressure at your visit was 140/90 or greater, please contact your primary care physician to follow up on this.  _______________________________________________________  If you are age 77 or older, your body mass index should be between 23-30. Your Body mass index is 28.34 kg/m. If this is out of the aforementioned range listed, please consider follow up with your Primary Care Provider.  If you are age 66 or younger, your body mass index should be between 19-25. Your Body mass index is 28.34 kg/m. If this is out of the aformentioned range listed, please consider follow up with your Primary Care Provider.   __________________________________________________________  The [facility] providers would like to encourage you to use [facility] to communicate with providers for non-urgent requests or questions.  Due to long hold times on the telephone, sending your provider a message by [facility] may be a faster and more efficient way to get a response.  Please allow 48 business hours for a response.  Please remember that this is for non-urgent requests.   Due to recent changes in healthcare laws, you may see the results of your imaging and laboratory studies on MyChart before your provider has had a chance to review them.  We understand that in some cases there may be results that are confusing or concerning to you. Not all laboratory results come back in the same time frame and the provider may be waiting for multiple results in order to interpret others.  Please give [place] 48 hours in order for your provider to thoroughly review all the results before contacting the office for clarification of your results.   Thank you for choosing me and [facility].  Vito [name], D.O.

## 2023-03-16 ENCOUNTER — Ambulatory Visit: Payer: Medicare Other | Admitting: Gastroenterology

## 2023-03-30 ENCOUNTER — Telehealth: Payer: Self-pay | Admitting: Family

## 2023-03-30 NOTE — Telephone Encounter (Signed)
Patient's granddaughter called requesting a transfer from [facility] to [facility] in [place] so they can have the office be closer to them for her grandma. Is this ok?

## 2023-03-30 NOTE — Telephone Encounter (Signed)
Please schedule TOC for patient in your office. Thank you.

## 2023-04-02 ENCOUNTER — Encounter: Payer: Self-pay | Admitting: Physician Assistant

## 2023-04-04 ENCOUNTER — Other Ambulatory Visit: Payer: Self-pay | Admitting: Family

## 2023-04-06 ENCOUNTER — Ambulatory Visit (INDEPENDENT_AMBULATORY_CARE_PROVIDER_SITE_OTHER): Payer: Medicare Other | Admitting: Family

## 2023-04-06 DIAGNOSIS — R2681 Unsteadiness on feet: Secondary | ICD-10-CM

## 2023-04-14 NOTE — Progress Notes (Signed)
Patient cancelled appointment - was not seen.

## 2023-05-04 ENCOUNTER — Ambulatory Visit: Payer: Medicare Other

## 2023-05-06 ENCOUNTER — Telehealth: Payer: Self-pay | Admitting: Gastroenterology

## 2023-05-06 ENCOUNTER — Other Ambulatory Visit: Payer: Self-pay

## 2023-05-06 ENCOUNTER — Encounter (HOSPITAL_COMMUNITY): Payer: Self-pay

## 2023-05-06 ENCOUNTER — Inpatient Hospital Stay (HOSPITAL_COMMUNITY)
Admission: EM | Admit: 2023-05-06 | Discharge: 2023-05-08 | DRG: 378 | Disposition: A | Discharge status: Home / Self Care | Payer: Medicare Other | Attending: Internal Medicine | Admitting: Internal Medicine

## 2023-05-06 DIAGNOSIS — Z888 Allergy status to other drugs, medicaments and biological substances status: Secondary | ICD-10-CM

## 2023-05-06 DIAGNOSIS — D62 Acute posthemorrhagic anemia: Secondary | ICD-10-CM | POA: Diagnosis present

## 2023-05-06 DIAGNOSIS — K449 Diaphragmatic hernia without obstruction or gangrene: Secondary | ICD-10-CM | POA: Diagnosis present

## 2023-05-06 DIAGNOSIS — M810 Age-related osteoporosis without current pathological fracture: Secondary | ICD-10-CM | POA: Diagnosis present

## 2023-05-06 DIAGNOSIS — Z90710 Acquired absence of both cervix and uterus: Secondary | ICD-10-CM

## 2023-05-06 DIAGNOSIS — Z7983 Long term (current) use of bisphosphonates: Secondary | ICD-10-CM

## 2023-05-06 DIAGNOSIS — E119 Type 2 diabetes mellitus without complications: Secondary | ICD-10-CM | POA: Diagnosis present

## 2023-05-06 DIAGNOSIS — F0393 Unspecified dementia, unspecified severity, with mood disturbance: Secondary | ICD-10-CM | POA: Diagnosis present

## 2023-05-06 DIAGNOSIS — F32A Depression, unspecified: Secondary | ICD-10-CM | POA: Diagnosis present

## 2023-05-06 DIAGNOSIS — K254 Chronic or unspecified gastric ulcer with hemorrhage: Principal | ICD-10-CM | POA: Diagnosis present

## 2023-05-06 DIAGNOSIS — R7989 Other specified abnormal findings of blood chemistry: Secondary | ICD-10-CM | POA: Diagnosis present

## 2023-05-06 DIAGNOSIS — R944 Abnormal results of kidney function studies: Secondary | ICD-10-CM | POA: Diagnosis present

## 2023-05-06 DIAGNOSIS — K922 Gastrointestinal hemorrhage, unspecified: Principal | ICD-10-CM

## 2023-05-06 DIAGNOSIS — K921 Melena: Secondary | ICD-10-CM | POA: Diagnosis present

## 2023-05-06 DIAGNOSIS — Z791 Long term (current) use of non-steroidal anti-inflammatories (NSAID): Secondary | ICD-10-CM | POA: Diagnosis not present

## 2023-05-06 DIAGNOSIS — Z79899 Other long term (current) drug therapy: Secondary | ICD-10-CM

## 2023-05-06 DIAGNOSIS — E785 Hyperlipidemia, unspecified: Secondary | ICD-10-CM | POA: Diagnosis present

## 2023-05-06 DIAGNOSIS — D649 Anemia, unspecified: Secondary | ICD-10-CM

## 2023-05-06 DIAGNOSIS — Z66 Do not resuscitate: Secondary | ICD-10-CM | POA: Diagnosis present

## 2023-05-06 DIAGNOSIS — I10 Essential (primary) hypertension: Secondary | ICD-10-CM | POA: Diagnosis present

## 2023-05-06 DIAGNOSIS — Z818 Family history of other mental and behavioral disorders: Secondary | ICD-10-CM | POA: Diagnosis not present

## 2023-05-06 DIAGNOSIS — K219 Gastro-esophageal reflux disease without esophagitis: Secondary | ICD-10-CM | POA: Diagnosis present

## 2023-05-06 DIAGNOSIS — K317 Polyp of stomach and duodenum: Secondary | ICD-10-CM | POA: Diagnosis present

## 2023-05-06 DIAGNOSIS — G309 Alzheimer's disease, unspecified: Secondary | ICD-10-CM | POA: Diagnosis present

## 2023-05-06 DIAGNOSIS — K625 Hemorrhage of anus and rectum: Secondary | ICD-10-CM | POA: Diagnosis not present

## 2023-05-06 DIAGNOSIS — K259 Gastric ulcer, unspecified as acute or chronic, without hemorrhage or perforation: Secondary | ICD-10-CM | POA: Diagnosis not present

## 2023-05-06 HISTORY — DX: Melena: K92.1

## 2023-05-06 LAB — COMPREHENSIVE METABOLIC PANEL
ALT: 51 U/L — ABNORMAL HIGH (ref 0–44)
AST: 47 U/L — ABNORMAL HIGH (ref 15–41)
Albumin: 3 g/dL — ABNORMAL LOW (ref 3.5–5.0)
Alkaline Phosphatase: 65 U/L (ref 38–126)
Anion gap: 4 — ABNORMAL LOW (ref 5–15)
BUN: 25 mg/dL — ABNORMAL HIGH (ref 8–23)
CO2: 24 mmol/L (ref 22–32)
Calcium: 9.3 mg/dL (ref 8.9–10.3)
Chloride: 110 mmol/L (ref 98–111)
Creatinine, Ser: 0.88 mg/dL (ref 0.44–1.00)
GFR, Estimated: >60 mL/min (ref >60)
Glucose, Bld: 159 mg/dL — ABNORMAL HIGH (ref 70–99)
Potassium: 4.2 mmol/L (ref 3.5–5.1)
Sodium: 138 mmol/L (ref 135–145)
Total Bilirubin: 0.6 mg/dL (ref 0.3–1.2)
Total Protein: 6.5 g/dL (ref 6.5–8.1)

## 2023-05-06 LAB — CBC
HCT: 33.8 % — ABNORMAL LOW (ref 36.0–46.0)
HCT: 31.6 % — ABNORMAL LOW (ref 36.0–46.0)
Hemoglobin: 10.3 g/dL — ABNORMAL LOW (ref 12.0–15.0)
Hemoglobin: 9.7 g/dL — ABNORMAL LOW (ref 12.0–15.0)
MCH: 25 pg — ABNORMAL LOW (ref 26.0–34.0)
MCH: 24.9 pg — ABNORMAL LOW (ref 26.0–34.0)
MCHC: 30.5 g/dL (ref 30.0–36.0)
MCHC: 30.7 g/dL (ref 30.0–36.0)
MCV: 82 fL (ref 80.0–100.0)
MCV: 81.2 fL (ref 80.0–100.0)
Platelets: 182 10*3/uL (ref 150–400)
Platelets: 183 10*3/uL (ref 150–400)
RBC: 4.12 MIL/uL (ref 3.87–5.11)
RBC: 3.89 MIL/uL (ref 3.87–5.11)
RDW: 18.9 % — ABNORMAL HIGH (ref 11.5–15.5)
RDW: 18.8 % — ABNORMAL HIGH (ref 11.5–15.5)
WBC: 8.3 10*3/uL (ref 4.0–10.5)
WBC: 8.1 10*3/uL (ref 4.0–10.5)
nRBC: 0 % (ref 0.0–0.2)
nRBC: 0 % (ref 0.0–0.2)

## 2023-05-06 LAB — TYPE AND SCREEN
ABO/RH(D): B POS
Antibody Screen: NEGATIVE

## 2023-05-06 LAB — GLUCOSE, CAPILLARY
Glucose-Capillary: 102 mg/dL — ABNORMAL HIGH (ref 70–99)
Glucose-Capillary: 104 mg/dL — ABNORMAL HIGH (ref 70–99)

## 2023-05-06 LAB — ABO/RH: ABO/RH(D): B POS

## 2023-05-06 LAB — HEMOGLOBIN A1C
Hgb A1c MFr Bld: 6.4 % — ABNORMAL HIGH (ref 4.8–5.6)
Mean Plasma Glucose: 136.98 mg/dL

## 2023-05-06 LAB — POC OCCULT BLOOD, ED: Fecal Occult Bld: POSITIVE — AB

## 2023-05-06 MED ORDER — ALBUTEROL SULFATE (2.5 MG/3ML) 0.083% IN NEBU: 2.5000 mg | INHALATION_SOLUTION | RESPIRATORY_TRACT | Status: DC | PRN

## 2023-05-06 MED ORDER — INSULIN ASPART 100 UNIT/ML IJ SOLN
0.0000 [IU] | Freq: Three times a day (TID) | INTRAMUSCULAR | Status: DC
  Administered 2023-05-07 – 2023-05-08 (×3): 2 [IU] via SUBCUTANEOUS
  Filled 2023-05-06: qty 0.15

## 2023-05-06 MED ORDER — DONEPEZIL HCL 10 MG PO TABS
5.0000 mg | ORAL_TABLET | Freq: Every day | ORAL | Status: DC
  Administered 2023-05-06 – 2023-05-07 (×2): 5 mg via ORAL
  Filled 2023-05-06 (×3): qty 1

## 2023-05-06 MED ORDER — SENNOSIDES-DOCUSATE SODIUM 8.6-50 MG PO TABS: 1.0000 | ORAL_TABLET | Freq: Every evening | ORAL | Status: DC | PRN

## 2023-05-06 MED ORDER — ONDANSETRON HCL 4 MG/2ML IJ SOLN: 4.0000 mg | Freq: Four times a day (QID) | INTRAMUSCULAR | Status: DC | PRN

## 2023-05-06 MED ORDER — FAMOTIDINE 20 MG PO TABS: 20.0000 mg | ORAL_TABLET | ORAL | Status: DC

## 2023-05-06 MED ORDER — ONDANSETRON HCL 4 MG PO TABS: 4.0000 mg | ORAL_TABLET | Freq: Four times a day (QID) | ORAL | Status: DC | PRN

## 2023-05-06 MED ORDER — INSULIN ASPART 100 UNIT/ML IJ SOLN
0.0000 [IU] | Freq: Every day | INTRAMUSCULAR | Status: DC
  Filled 2023-05-06: qty 0.05

## 2023-05-06 NOTE — Telephone Encounter (Signed)
Patients granddaughter is calling states patient has been having blood when she goes to the bathroom so they are concerned wishing to speak with the nurse to get some advice. Please advise

## 2023-05-06 NOTE — H&P (Addendum)
History and Physical  Brandi Walsh:[id number] DOB: 1946/04/18 DOA: 05/06/2023  PCP: [name], FNP   Chief Complaint: Melena  HPI: Brandi Walsh is a 77 y.o. female with medical history significant for diet-controlled diabetes, hypertension, hyperlipidemia, Alzheimer's dementia being admitted to the hospital with melena and blood loss anemia.  History is provided by the patient's husband, and granddaughter who she lives with and who take close care of her.  Granddaughter states that patient does have a prior history of internal hemorrhoids, is not on any blood thinners.  Yesterday, they noticed that after a bowel movement as they were cleaning her up the patient had some maroon-colored stool.  This reoccurred today.  Granddaughter spoke with GI yesterday, they recommended started to take MiraLAX due to history of internal hemorrhoids and constipation.  Since symptoms persisted today, granddaughter decided to bring the patient to the ER for evaluation.  Patient has dementia and typically does not complain of much, however family at bedside specifically denies nausea, vomiting, complaints of abdominal pain, diarrhea, fevers, chills.  On direct examination, patient denies the same though she does say that she had a little bit of abdominal discomfort earlier but cannot be more specific.  ED Course: Here the patient has been hemodynamically stable with normal vital signs.  Lab work revealed slightly elevated LFTs with AST 47, ALT 51.  Normal bilirubin and alk phos.  She does have a history of this.  Hemoglobin down to 10 from baseline of 12.  Rectal exam grossly positive for melanotic stool.  ER provider discussed with GI provider on-call, who will see in consultation and recommended hospitalist admission.  Review of Systems: Please see HPI for pertinent positives and negatives. A complete 10 system review of systems are otherwise negative.  Past Medical History:  Diagnosis Date    Alzheimer's disease (HCC)    COUGH DUE TO ACE INHIBITORS 11/25/2009   Qualifier: Diagnosis of  By: Everardo All MD, Sean A    Depression    DIABETES MELLITUS, TYPE II 10/13/2007   Qualifier: Diagnosis of  By: Charlsie Quest RMA, [place]     HYPERLIPIDEMIA 10/13/2007   Qualifier: Diagnosis of  By: Tyrone Apple, Lucy     HYPERTENSION 10/13/2007   Qualifier: Diagnosis of  By: Tyrone Apple, Lucy     MENOPAUSAL SYNDROME 10/13/2007   Qualifier: Diagnosis of  By: Charlsie Quest RMA, [place]     OSTEOPOROSIS 10/13/2007   Qualifier: Diagnosis of  By: Samara Snide     Past Surgical History:  Procedure Laterality Date   ABDOMINAL HYSTERECTOMY     BREAST BIOPSY  08/2002   TONSILLECTOMY      Social History:  reports that she has never smoked. She has never used smokeless tobacco. She reports that she does not drink alcohol and does not use drugs.   Allergies  Allergen Reactions   Lovastatin     REACTION: Rash    Family History  Problem Relation Age of Onset   Anxiety disorder Mother    Hyperparathyroidism Sister    Cancer Neg Hx    Breast cancer Neg Hx      Prior to Admission medications   Medication Sig Start Date End Date Taking? Authorizing Provider  alendronate (FOSAMAX) 70 MG tablet Take 70 mg by mouth once a week. 03/31/21   [provider]  Cholecalciferol (VITAMIN D) 50 MCG (2000 UT) tablet Take 2,000 Units by mouth daily.    [provider]  donepezil (ARICEPT) 5 MG tablet  Take 1 tablet daily 01/25/23   [name], PA-C  escitalopram (LEXAPRO) 10 MG tablet TAKE 1 & 1/2 (ONE & ONE-HALF) TABLETS BY MOUTH ONCE DAILY 12/02/22   [name], PMHNP  famotidine (PEPCID) 20 MG tablet Take 1 tablet (20 mg total) by mouth daily. 01/28/23   [name], [name], DO  losartan (COZAAR) 50 MG tablet Take 1 tablet (50 mg total) by mouth daily. 10/06/22   [name], FNP  meloxicam [facility]) 15 MG tablet Take 1 tablet by mouth once daily 04/05/23   [name], FNP  mupirocin cream  (BACTROBAN) 2 % Apply 1 Application topically 2 (two) times daily. 09/28/22   [name], DPM  rosuvastatin (CRESTOR) 40 MG tablet Take 1 tablet (40 mg total) by mouth daily. 10/06/22   [name], FNP    Physical Exam: BP 135/69 (BP Location: Right Arm)   Pulse 72   Temp 98.1 F (36.7 C) (Oral)   Resp 16   SpO2 100%   General:  Alert, oriented to self and place, calm, in no acute distress, granddaughter and husband at the bedside Eyes: EOMI, clear conjuctivae, white sclerea Neck: supple, no masses, trachea mildline  Cardiovascular: RRR, no murmurs or rubs, no peripheral edema  Respiratory: clear to auscultation bilaterally, no wheezes, no crackles  Abdomen: soft, nontender, nondistended, normal bowel tones heard  Skin: dry, no rashes  Musculoskeletal: no joint effusions, normal range of motion  Psychiatric: appropriate affect, normal speech  Neurologic: extraocular muscles intact, clear speech, moving all extremities with intact sensorium          Labs on Admission:  Basic Metabolic Panel: Recent Labs  Lab 05/06/23 1131  NA 138  K 4.2  CL 110  CO2 24  GLUCOSE 159*  BUN 25*  CREATININE 0.88  CALCIUM 9.3   Liver Function Tests: Recent Labs  Lab 05/06/23 1131  AST 47*  ALT 51*  ALKPHOS 65  BILITOT 0.6  PROT 6.5  ALBUMIN 3.0*   No results for input(s): "LIPASE", "AMYLASE" in the last 168 hours. No results for input(s): "AMMONIA" in the last 168 hours. CBC: Recent Labs  Lab 05/06/23 1131  WBC 8.3  HGB 10.3*  HCT 33.8*  MCV 82.0  PLT 182   Cardiac Enzymes: No results for input(s): "CKTOTAL", "CKMB", "CKMBINDEX", "TROPONINI" in the last 168 hours.  BNP (last 3 results) No results for input(s): "BNP" in the last 8760 hours.  ProBNP (last 3 results) No results for input(s): "PROBNP" in the last 8760 hours.  CBG: No results for input(s): "GLUCAP" in the last 168 hours.  Radiological Exams on Admission: No results  found.  Assessment/Plan Brandi Walsh is a 77 y.o. female with medical history significant for diet-controlled diabetes, hypertension, hyperlipidemia, Alzheimer's dementia being admitted to the hospital with melena and blood loss anemia.  Melena and acute blood loss anemia-most likely lower GI hemorrhage, hemoglobin has fallen from 12-10, no evidence of active bleeding and patient is hemodynamically stable. -Inpatient admission -Avoid blood thinners, takes none at home -Currently no indication for transfusion, will transfuse to keep hemoglobin above 7 -ER provider has discussed with GI, who will provide formal consult and evaluation -Will keep on a clear liquid diet in the meantime -Recheck hemoglobin at 2100 hrs, and again in the morning  Diet-controlled diabetes-last hemoglobin A1c 7.2, which was 6 months ago -Will plan on diabetic diet when eating -Update hemoglobin A1c -SSI in the meantime  Alzheimer's dementia-continue home Aricept  Chronic home medications for hypertension, hyperlipidemia, etc. will be reordered as appropriate once medication list has been reconciled by pharmacy staff.  DVT prophylaxis: SCDs    Code Status: DNR, ACP documents reviewed including DNR goldenrod form, and confirmed with the patient's family at the bedside at the time of admission.  Consults called: EDP has consulted GI.  Admission status: The appropriate patient status for this patient is INPATIENT. Inpatient status is judged to be reasonable and necessary in order to provide the required intensity of service to ensure the patient's safety. The patient's presenting symptoms, physical exam findings, and initial radiographic and laboratory data in the context of their chronic comorbidities is felt to place them at high risk for further clinical deterioration. Furthermore, it is not anticipated that the patient will be medically stable for discharge from the hospital within 2 midnights of admission.     I certify that at the point of admission it is my clinical judgment that the patient will require inpatient hospital care spanning beyond 2 midnights from the point of admission due to high intensity of service, high risk for further deterioration and high frequency of surveillance required  Time spent: 53 minutes  Cruz Bong Sharlette Dense MD [facility] Pager [phone]  If 7PM-7AM, please contact night-coverage [email or web address] Password [email or web address]  05/06/2023, 1:44 PM

## 2023-05-06 NOTE — Plan of Care (Signed)

## 2023-05-06 NOTE — ED Provider Notes (Signed)
[place] EMERGENCY DEPARTMENT AT [facility] Provider Note   CSN: [id number] Arrival date & time: 05/06/23  1035     History  Chief Complaint  Patient presents with  . Rectal Bleeding    Brandi Walsh is a 77 y.o. female.   Rectal Bleeding    Patient has a history of diabetes hyperlipidemia hypertension, Alzheimer's disease.  She presents ED for evaluation of rectal bleeding.  Patient uses a depends diaper.  Family has to assist her with toileting.  Her husband noticed a small amount of blood in her diaper the other day.  Patient has not been having any fevers.  No vomiting.  No complaints of abdominal pain.  However, family states she usually does not complain much.  The granddaughter called the GI doctor.  They recommended the patient start taking MiraLAX as the patient does have a history of internal hemorrhoids.  Recommendation was to follow-up in a few days to see if the symptoms improved.  Family noticed more blood today so she came to the ED for evaluation.  Home Medications Prior to Admission medications   Medication Sig Start Date End Date Taking? Authorizing Provider  alendronate (FOSAMAX) 70 MG tablet Take 70 mg by mouth once a week. 03/31/21   [provider]  Cholecalciferol (VITAMIN D) 50 MCG (2000 UT) tablet Take 2,000 Units by mouth daily.    [provider]  donepezil (ARICEPT) 5 MG tablet Take 1 tablet daily 01/25/23   [name], PA-C  escitalopram (LEXAPRO) 10 MG tablet TAKE 1 & 1/2 (ONE & ONE-HALF) TABLETS BY MOUTH ONCE DAILY 12/02/22   [name], PMHNP  famotidine (PEPCID) 20 MG tablet Take 1 tablet (20 mg total) by mouth daily. 01/28/23   [name], [name], DO  losartan (COZAAR) 50 MG tablet Take 1 tablet (50 mg total) by mouth daily. 10/06/22   [name], FNP  meloxicam [facility]) 15 MG tablet Take 1 tablet by mouth once daily 04/05/23   [name], FNP  mupirocin cream (BACTROBAN) 2 % Apply 1  Application topically 2 (two) times daily. 09/28/22   [name], DPM  rosuvastatin (CRESTOR) 40 MG tablet Take 1 tablet (40 mg total) by mouth daily. 10/06/22   [name], FNP      Allergies    Lovastatin    Review of Systems   Review of Systems  Gastrointestinal:  Positive for hematochezia.    Physical Exam Updated Vital Signs BP 135/69 (BP Location: Right Arm)   Pulse 72   Temp 98.1 F (36.7 C) (Oral)   Resp 16   SpO2 100%  Physical Exam Vitals and nursing note reviewed.  Constitutional:      Appearance: She is well-developed. She is not diaphoretic.  HENT:     Head: Normocephalic and atraumatic.     Right Ear: External ear normal.     Left Ear: External ear normal.  Eyes:     General: No scleral icterus.       Right eye: No discharge.        Left eye: No discharge.     Conjunctiva/sclera: Conjunctivae normal.  Neck:     Trachea: No tracheal deviation.  Cardiovascular:     Rate and Rhythm: Normal rate and regular rhythm.  Pulmonary:     Effort: Pulmonary effort is normal. No respiratory distress.     Breath sounds: Normal breath sounds. No stridor. No wheezing or rales.  Abdominal:  General: Bowel sounds are normal. There is no distension.     Palpations: Abdomen is soft.     Tenderness: There is no abdominal tenderness. There is no guarding or rebound.  Genitourinary:    Comments: Maroon blood noted on rectal exam, no clots, no bright red blood, no fecal impaction or constipation appreciated, no rectal mass appreciated Musculoskeletal:        General: No tenderness or deformity.     Cervical back: Neck supple.  Skin:    General: Skin is warm and dry.     Findings: No rash.  Neurological:     General: No focal deficit present.     Mental Status: She is alert.     Cranial Nerves: No cranial nerve deficit, dysarthria or facial asymmetry.     Sensory: No sensory deficit.     Motor: No abnormal muscle tone or seizure activity.      Coordination: Coordination normal.  Psychiatric:        Mood and Affect: Mood normal.     ED Results / Procedures / Treatments   Labs (all labs ordered are listed, but only abnormal results are displayed) Labs Reviewed  COMPREHENSIVE METABOLIC PANEL - Abnormal; Notable for the following components:      Result Value   Glucose, Bld 159 (*)    BUN 25 (*)    Albumin 3.0 (*)    AST 47 (*)    ALT 51 (*)    Anion gap 4 (*)    All other components within normal limits  CBC - Abnormal; Notable for the following components:   Hemoglobin 10.3 (*)    HCT 33.8 (*)    MCH 25.0 (*)    RDW 18.9 (*)    All other components within normal limits  POC OCCULT BLOOD, ED - Abnormal; Notable for the following components:   Fecal Occult Bld POSITIVE (*)    All other components within normal limits  TYPE AND SCREEN  ABO/RH    EKG None  Radiology No results found.  Procedures Procedures    Medications Ordered in ED Medications - No data to display  ED Course/ Medical Decision Making/ A&P Clinical Course as of 05/06/23 1322  Thu May 06, 2023  1251 CBC(!) Hemoglobin decreased compared to previous.  Metabolic panel does shows elevated BUN.  Hemoccult is positive [JK]  1322 Case discussed with [facility].  Will see patient in consultation [JK]    Clinical Course User Index [JK] Linwood Dibbles, MD                             Medical Decision Making Problems Addressed: Acute GI bleeding: acute illness or injury that poses a threat to life or bodily functions Anemia, unspecified type: acute illness or injury that poses a threat to life or bodily functions  Amount and/or Complexity of Data Reviewed Labs: ordered. Decision-making details documented in ED Course.   Patient presents ED for evaluation of GI bleeding.  On exam patient noted to have dark blood in her stool.  She does have a decrease in her hemoglobin but is otherwise hemodynamically stable.  Patient is not complaining any pain  to suggest diverticulitis.  She does not have a fever.  No leukocytosis.  With her drop in hemoglobin and her rectal bleeding I will consult the medical service for admission.  Will consult with [facility] who she sees in the office.  Final Clinical Impression(s) / ED Diagnoses Final diagnoses:  Acute GI bleeding  Anemia, unspecified type    Rx / DC Orders ED Discharge Orders     None         Linwood Dibbles, MD 05/06/23 1300

## 2023-05-06 NOTE — Consult Note (Addendum)
Consultation  Referring Provider:  [facility]  Primary Care Physician:  [name], FNP Primary Gastroenterologist:  Dr. Barron Alvine       Reason for Consultation:  Rectal bleeding     LOS: 0 days          HPI:   Brandi Walsh is a 77 y.o. female with past medical history significant for diabetes, hyperlipidemia, Gilbert's, hypertension, Alzheimer's, presents for evaluation of rectal bleeding.  Granddaughter and husband at bedside provide most of history.   Patient has history of Alzheimer's but is alert.  Family noted patient was having small amount of blood in her depends which started yesterday afternoon (6/5). One episode yesterday and then this morning when husband woke up he noticed patient had maroon bloody bowel movement in depends that had come up patient's back and out of undergarment.  Advised to use stool softeners due to constipation with history of internal hemorrhoids. However, granddaughter came over to give patient suppository and w hen patient went to the bathroom she had another maroon loose bowel movement which prompted their visit to the ED for further evaluation.    No previous episodes of rectal bleeding. Denies melena though states stool is a darker brown. No acute weight loss. Denies nausea, vomiting, and abdominal pain. Patient takes meloxicam daily for many years. No other NSAID use. Denies alcohol or tobacco use.  Upon admission Hgb 10.3, MCV 82.0.  Isolated elevation in BUN 25, creatinine 0.88. AST 47/ALT 51/alk phos 65 Positive fecal occult EDP physical exam showed maroon blood, no evidence of mass or fecal impaction.  Last seen 12/2022 by Dr. Barron Alvine. Patient's granddaughter was concerned for PPI use and history of dementia so antacid was switched to H2 blocker at that time (pepcid 20mg ).  Previous GI workup---------------------------------------------------------------- EGD 06/11/2020 for dysphagia and weight loss: Mild Schatzki ring-dilated.   Normal upper third/middle third of esophagus.  20 mm hyperplastic polyp in gastric antrum (biopsies benign).  Benign fundic gland polyps.  gastritis, normal duodenal bulb.  Negative for H. pylori   Last colonoscopy in 2007.  Report unavailable. Granddaughter states she doesn't recall anything concerning on reports. Denies family history of colon cancer.   Past Medical History:  Diagnosis Date   Alzheimer's disease (HCC)    COUGH DUE TO ACE INHIBITORS 11/25/2009   Qualifier: Diagnosis of  By: Everardo All MD, Sean A    Depression    DIABETES MELLITUS, TYPE II 10/13/2007   Qualifier: Diagnosis of  By: Charlsie Quest RMA, Lucy     HYPERLIPIDEMIA 10/13/2007   Qualifier: Diagnosis of  By: Tyrone Apple, Lucy     HYPERTENSION 10/13/2007   Qualifier: Diagnosis of  By: Tyrone Apple, Lucy     MENOPAUSAL SYNDROME 10/13/2007   Qualifier: Diagnosis of  By: Charlsie Quest RMA, Lucy     OSTEOPOROSIS 10/13/2007   Qualifier: Diagnosis of  By: Tyrone Apple, Lucy      Surgical History:  She  has a past surgical history that includes Abdominal hysterectomy; Breast biopsy (08/2002); and Tonsillectomy. Family History:  Her family history includes Anxiety disorder in her mother; Hyperparathyroidism in her sister. Social History:   reports that she has never smoked. She has never used smokeless tobacco. She reports that she does not drink alcohol and does not use drugs.  Prior to Admission medications   Medication Sig Start Date End Date Taking? Authorizing Provider  alendronate (FOSAMAX) 70 MG tablet Take 70 mg by mouth once a week. 03/31/21  [provider]  Cholecalciferol (VITAMIN D) 50 MCG (2000 UT) tablet Take 2,000 Units by mouth daily.    [provider]  donepezil (ARICEPT) 5 MG tablet Take 1 tablet daily 01/25/23   [name], PA-C  escitalopram (LEXAPRO) 10 MG tablet TAKE 1 & 1/2 (ONE & ONE-HALF) TABLETS BY MOUTH ONCE DAILY 12/02/22   [name], PMHNP  famotidine (PEPCID) 20 MG tablet Take 1 tablet  (20 mg total) by mouth daily. 01/28/23   [name], [name], DO  losartan (COZAAR) 50 MG tablet Take 1 tablet (50 mg total) by mouth daily. 10/06/22   [name], FNP  meloxicam [facility]) 15 MG tablet Take 1 tablet by mouth once daily 04/05/23   [name], FNP  mupirocin cream (BACTROBAN) 2 % Apply 1 Application topically 2 (two) times daily. 09/28/22   [name], DPM  rosuvastatin (CRESTOR) 40 MG tablet Take 1 tablet (40 mg total) by mouth daily. 10/06/22   [name], FNP    No current facility-administered medications for this encounter.   Current Outpatient Medications  Medication Sig Dispense Refill   alendronate (FOSAMAX) 70 MG tablet Take 70 mg by mouth once a week.     Cholecalciferol (VITAMIN D) 50 MCG (2000 UT) tablet Take 2,000 Units by mouth daily.     donepezil (ARICEPT) 5 MG tablet Take 1 tablet daily 90 tablet 3   escitalopram (LEXAPRO) 10 MG tablet TAKE 1 & 1/2 (ONE & ONE-HALF) TABLETS BY MOUTH ONCE DAILY 135 tablet 3   famotidine (PEPCID) 20 MG tablet Take 1 tablet (20 mg total) by mouth daily. 90 tablet 5   losartan (COZAAR) 50 MG tablet Take 1 tablet (50 mg total) by mouth daily. 90 tablet 3   meloxicam (MOBIC) 15 MG tablet Take 1 tablet by mouth once daily 90 tablet 0   mupirocin cream (BACTROBAN) 2 % Apply 1 Application topically 2 (two) times daily. 15 g 0   rosuvastatin (CRESTOR) 40 MG tablet Take 1 tablet (40 mg total) by mouth daily. 90 tablet 3    Allergies as of 05/06/2023 - Review Complete 05/06/2023  Allergen Reaction Noted   Lovastatin      Review of Systems  Unable to perform ROS: Dementia       Physical Exam:  Vital signs in last 24 hours: Temp:  [98.1 F (36.7 C)] 98.1 F (36.7 C) (06/06 1041) Pulse Rate:  [72] 72 (06/06 1041) Resp:  [16] 16 (06/06 1041) BP: (135)/(69) 135/69 (06/06 1041) SpO2:  [100 %] 100 % (06/06 1041)   Last BM recorded by nurses in past 5 days No data recorded  Physical  Exam Constitutional:      Appearance: Normal appearance.  HENT:     Head: Normocephalic and atraumatic.     Nose: Nose normal. No congestion.     Mouth/Throat:     Mouth: Mucous membranes are moist.     Pharynx: Oropharynx is clear.  Eyes:     General: No scleral icterus.    Extraocular Movements: Extraocular movements intact.  Cardiovascular:     Rate and Rhythm: Normal rate and regular rhythm.  Pulmonary:     Effort: Pulmonary effort is normal. No respiratory distress.  Abdominal:     General: Abdomen is flat. Bowel sounds are normal. There is no distension.     Palpations: Abdomen is soft. There is no mass.     Tenderness: There is no abdominal tenderness. There is no guarding or  rebound.     Hernia: No hernia is present.  Musculoskeletal:        General: Normal range of motion.     Cervical back: Normal range of motion and neck supple.  Skin:    General: Skin is warm and dry.     Coloration: Skin is pale.  Neurological:     General: No focal deficit present.     Mental Status: She is alert. Mental status is at baseline.  Psychiatric:        Mood and Affect: Mood normal.        Behavior: Behavior normal.        Thought Content: Thought content normal.        Judgment: Judgment normal.      LAB RESULTS: Recent Labs    05/06/23 1131  WBC 8.3  HGB 10.3*  HCT 33.8*  PLT 182   BMET Recent Labs    05/06/23 1131  NA 138  K 4.2  CL 110  CO2 24  GLUCOSE 159*  BUN 25*  CREATININE 0.88  CALCIUM 9.3   LFT Recent Labs    05/06/23 1131  PROT 6.5  ALBUMIN 3.0*  AST 47*  ALT 51*  ALKPHOS 65  BILITOT 0.6   PT/INR No results for input(s): "LABPROT", "INR" in the last 72 hours.  STUDIES: No results found.    Impression    GI bleed -Hgb 10.3 -BUN 25, creatinine 0.88 -Fecal occult positive Patient recently switched from PPI to H2 blocker February 2024.  Chronic use of meloxicam.  Presenting with maroon-colored stools since yesterday.  Cannot view  previous colonoscopy report and no previous abdominal imaging on file.  Unknown if patient has diverticular disease.  Suspect patient's symptoms are secondary to brisk upper GI bleed in the setting of NSAIDs especially with isolated elevation in BUN and drop in hemoglobin.  Discussion with granddaughter revealed her concerns for anesthesia with patient's history of dementia.  Presented multiple options to family members about how to proceed.   Plan   - if multiple bloody bowel movements in a short time frame (1-2 hrs), recommend proceed with CTA for further evaluation - Discussed option of EGD. Family appears hesitant with anesthesia risk.  At this time they would prefer to observe and treat conservatively. -If further overt bleeding or acute drop in hemoglobin, family states they will reconsider and possibly agree to EGD. -Protonix 40 Mg IV twice daily -Continue daily CBC and transfuse as needed to maintain HGB > 7   ADDENDUM 3:56PM - Plan for EGD Saturday 6/8 - Can have clears from GI standpoint  Thank you for your kind consultation, we will continue to follow.   Bayley Leanna Sato  05/06/2023, 1:21 PM      Attending physician's note   I have taken history, reviewed the chart and examined the patient. I performed a substantive portion of this encounter, including complete performance of at least one of the key components, in conjunction with the APP. I agree with the Advanced Practitioner's note, impression and recommendations.   D/W grand-daughter in detail  GI Bleed- upper vs lower.  -Elevated BUN, off PPIs, on mobic, Hb drop 12 -->10, do favor UGI bleed. -Also has H/O hemorrhoids.  Last colon 2007. Progressive Alzheimer's dementia-patient unable to provide any history.  Plan: -IV Protonix -D/C Mobic -Trend CBC. Keep Hb>7 -Granddaughter agrees for proceeding with EGD 6/8 (due to scheduling constraints).  She does not colonoscopy.  I agree. -If any active brisk  bleeding, CTA  followed by IR embolization   Edman Circle, MD [facility] [phone]

## 2023-05-06 NOTE — ED Triage Notes (Signed)
Pt presents with c/o rectal bleeding that started yesterday. Pt reports the blood was initially bright red in color and is now darker. Pt denies any pain.

## 2023-05-06 NOTE — Telephone Encounter (Signed)
Called and spoke with patient's granddaughter, Brandi Walsh. Brandi Walsh reports that her grandfather called her informing her that he may have seen blood in the patient's depend, it was a darker red color - pt's spouse thought that it may have been something that the patient had eaten. Pt had another bowel movement with what looked like blood and when her spouse wiped her he noticed BRB on the toilet paper this morning. Brandi Walsh states that they only give the pt Senna if she has not had a bowel movement in 3 or more days and she will typically pass a stool that evening. Brandi Walsh reports that patients bowel habits consist of having a bowel movement every 2-3 days. Brandi Walsh told me that pt will not tell them if she is having to strain to have a bowel movement or if she is having any symptoms. I told Brandi Walsh that patient has a hx of internal hemorrhoids and that is likely what is causing the bleeding. I suggested that they give her 1/2 capful of Miralax daily to help make it easier for her stools to pass. Brandi Walsh was not interested in adding Miralax because the stool that she observed in the depend was a softer stool. I explained that patient could be constipated and have overflow diarrhea. Pt really would need an x-ray to confirm if she is actually constipated or not. We decided to scheduled patient for an OV on 05/13/23 at 9 am with Willette Cluster, NP. In the interim Brandi Walsh was advised that they should have patient use Preparation H suppositories BID for 3-5 days and see if that helps the bleeding at all. Brandi Walsh has been advised that if patient develops SOB, dizziness, or fatigue she will need to go to ER for expedited evaluation. Brandi Walsh verbalized understanding and had no concerns at the end of the call.

## 2023-05-07 DIAGNOSIS — K625 Hemorrhage of anus and rectum: Secondary | ICD-10-CM | POA: Diagnosis not present

## 2023-05-07 DIAGNOSIS — K921 Melena: Secondary | ICD-10-CM | POA: Diagnosis not present

## 2023-05-07 LAB — CBC
HCT: 31.9 % — ABNORMAL LOW (ref 36.0–46.0)
Hemoglobin: 10 g/dL — ABNORMAL LOW (ref 12.0–15.0)
MCH: 25.4 pg — ABNORMAL LOW (ref 26.0–34.0)
MCHC: 31.3 g/dL (ref 30.0–36.0)
MCV: 81 fL (ref 80.0–100.0)
Platelets: 184 10*3/uL (ref 150–400)
RBC: 3.94 MIL/uL (ref 3.87–5.11)
RDW: 18.9 % — ABNORMAL HIGH (ref 11.5–15.5)
WBC: 8.4 10*3/uL (ref 4.0–10.5)
nRBC: 0 % (ref 0.0–0.2)

## 2023-05-07 LAB — BASIC METABOLIC PANEL
Anion gap: 6 (ref 5–15)
BUN: 18 mg/dL (ref 8–23)
CO2: 23 mmol/L (ref 22–32)
Calcium: 9.4 mg/dL (ref 8.9–10.3)
Chloride: 108 mmol/L (ref 98–111)
Creatinine, Ser: 0.76 mg/dL (ref 0.44–1.00)
GFR, Estimated: >60 mL/min (ref >60)
Glucose, Bld: 101 mg/dL — ABNORMAL HIGH (ref 70–99)
Potassium: 4.1 mmol/L (ref 3.5–5.1)
Sodium: 137 mmol/L (ref 135–145)

## 2023-05-07 LAB — GLUCOSE, CAPILLARY
Glucose-Capillary: 129 mg/dL — ABNORMAL HIGH (ref 70–99)
Glucose-Capillary: 125 mg/dL — ABNORMAL HIGH (ref 70–99)
Glucose-Capillary: 101 mg/dL — ABNORMAL HIGH (ref 70–99)
Glucose-Capillary: 160 mg/dL — ABNORMAL HIGH (ref 70–99)

## 2023-05-07 MED ORDER — PANTOPRAZOLE SODIUM 40 MG IV SOLR
40.0000 mg | Freq: Two times a day (BID) | INTRAVENOUS | Status: DC
  Administered 2023-05-07 – 2023-05-08 (×3): 40 mg via INTRAVENOUS
  Filled 2023-05-07 (×3): qty 10

## 2023-05-07 MED ORDER — SODIUM CHLORIDE 0.9 % IV SOLN: INTRAVENOUS | Status: DC

## 2023-05-07 NOTE — Progress Notes (Addendum)
  Progress Note  Primary GI: Dr. [name]  LOS: 1 day   Chief Complaint: Rectal bleeding   Subjective   Patient with family at bedside, husband. Provided some of the history.  Husband states patient had 1 bloody bowel movement yesterday evening before bed and 1 bloody bowel movement this morning when she woke up.  Continues to be maroon in color.  Patient denies abdominal pain, nausea, vomiting.  Tolerating full liquids without difficulty   Objective   Vital signs in last 24 hours: Temp:  [98.1 F (36.7 C)-98.7 F (37.1 C)] 98.7 F (37.1 C) (06/07 0425) Pulse Rate:  [55-80] 68 (06/07 0425) Resp:  [15-16] 16 (06/07 0425) BP: (115-137)/(56-70) 115/56 (06/07 0425) SpO2:  [98 %-100 %] 98 % (06/07 0425) Last BM Date : 05/06/23 Last BM recorded by nurses in past 5 days Stool Type: Type 6 (Mushy consistency with ragged edges) (05/06/2023  4:00 PM)  General:   female in no acute distress  Heart:  Regular rate and rhythm; no murmurs Pulm: Clear anteriorly; no wheezing Abdomen: soft, nondistended, normal bowel sounds in all quadrants. Nontender without guarding. No organomegaly appreciated. Extremities:  No edema Neurologic: Alert, at baseline Psych:  Cooperative. Normal mood and affect.  Intake/Output from previous day: No intake/output data recorded. Intake/Output this shift: Total I/O In: 120 [P.O.:120] Out: -   Studies/Results: No results found.  Lab Results: Recent Labs    05/06/23 1131 05/06/23 2036 05/07/23 0428  WBC 8.3 8.1 8.4  HGB 10.3* 9.7* 10.0*  HCT 33.8* 31.6* 31.9*  PLT 182 183 184   BMET Recent Labs    05/06/23 1131 05/07/23 0428  NA 138 137  K 4.2 4.1  CL 110 108  CO2 24 23  GLUCOSE 159* 101*  BUN 25* 18  CREATININE 0.88 0.76  CALCIUM 9.3 9.4   LFT Recent Labs    05/06/23 1131  PROT 6.5  ALBUMIN 3.0*  AST 47*  ALT 51*  ALKPHOS 65  BILITOT 0.6   PT/INR No results for input(s): "LABPROT", "INR" in the last 72  hours.   Scheduled Meds:  donepezil  5 mg Oral QHS   insulin aspart  0-15 Units Subcutaneous TID WC   insulin aspart  0-5 Units Subcutaneous QHS   pantoprazole (PROTONIX) IV  40 mg Intravenous Q12H   Continuous Infusions:  sodium chloride        Patient profile:   Brandi Walsh is a 77 y.o. female with past medical history significant for diabetes, hyperlipidemia, Gilbert's, hypertension, Alzheimer's, presents for evaluation of rectal bleeding.   Patient switched from PPI to H2 blocker February 2024 and has chronic use of meloxicam   Impression:   GI bleed -Fecal occult positive. -Hgb 10.0, stable -BUN 18, creatinine 0.76, improved   Plan:   Plan for EGD tomorrow. I thoroughly discussed the procedures to include nature, alternatives, benefits, and risks including but not limited to bleeding, perforation, infection, anesthesia/cardiac and pulmonary complications. Patient provides understanding and gave verbal consent to proceed. Continue Protonix IV 40mg BID Continue diet NPO at midnight Continue daily CBC and transfuse as needed to maintain HGB > 7  If any active brisk bleeding, CTA followed by IR embolization    Bayley M McMichael  05/07/2023, 9:37 AM    Attending physician's note   I have taken history, reviewed the chart and examined the patient. I performed a substantive portion of this encounter, including complete performance of at least one of the key components, in   conjunction with the APP. I agree with the Advanced Practitioner's note, impression and recommendations.   2 episodes of maroonish/black stools. HD stabe. Hb stable 10 Continue Protonix EGD in AM Discussed with husband and granddaughter   Raj Kaniya Trueheart, MD [facility] [phone]    

## 2023-05-07 NOTE — Progress Notes (Signed)
PROGRESS NOTE    LAZIYA MORELLO  ZOX:[id number] DOB: 1946-04-20 DOA: 05/06/2023 PCP: [name], FNP   Brief Narrative:    ANNELI KLAGES is a 77 y.o. female with medical history significant for diet-controlled diabetes, hypertension, hyperlipidemia, Alzheimer's dementia being admitted to the hospital with melena and blood loss anemia.  GI planning for EGD 6/8.  Assessment & Plan:   Principal Problem:   Melena  Assessment and Plan:   Melena and acute blood loss anemia-most likely lower GI hemorrhage, hemoglobin has fallen from 12-10, no evidence of active bleeding and patient is hemodynamically stable. -Inpatient admission -Avoid blood thinners, takes none at home -Currently no indication for transfusion, will transfuse to keep hemoglobin above 7 - Hemoglobin appears stable and patient on full liquid diet with plans for n.p.o. after midnight -GI planning for EGD 6/8 -Continue PPI IV twice daily   Diet-controlled diabetes-last hemoglobin A1c 7.2, which was 6 months ago -Will plan on diabetic diet when eating -A1c 6.4% -SSI in the meantime   Alzheimer's dementia-continue home Aricept   Chronic home medications for hypertension, hyperlipidemia, etc. will be reordered as appropriate once medication list has been reconciled by pharmacy staff.    DVT prophylaxis: SCDs Code Status: DNR Family Communication: Husband and son at bedside 6/7 Disposition Plan:  Status is: Inpatient Need for IV medications and inpatient procedure.   Consultants:  GI  Procedures:  None  Antimicrobials:  None   Subjective: Patient seen and evaluated today with no new acute complaints or concerns.  She was noted to have 1 bloody bowel movement yesterday and 1 this morning.  She denies any abdominal pain, nausea, or vomiting and is tolerating full liquids.  Objective: Vitals:   05/06/23 1448 05/06/23 2013 05/06/23 2310 05/07/23 0425  BP: 137/70 125/70 127/61 (!) 115/56   Pulse: (!) 55 (!) 59 80 68  Resp: 16 16 16 16   Temp: 98.4 F (36.9 C) 98.3 F (36.8 C) 98.4 F (36.9 C) 98.7 F (37.1 C)  TempSrc: Oral Oral Oral Oral  SpO2: 100% 99% 98% 98%    Intake/Output Summary (Last 24 hours) at 05/07/2023 0944 Last data filed at 05/07/2023 8119 Gross per 24 hour  Intake 120 ml  Output --  Net 120 ml   There were no vitals filed for this visit.  Examination:  General exam: Appears calm and comfortable  Respiratory system: Clear to auscultation. Respiratory effort normal. Cardiovascular system: S1 & S2 heard, RRR.  Gastrointestinal system: Abdomen is soft Central nervous system: Alert and awake Extremities: No edema Skin: No significant lesions noted Psychiatry: Flat affect.    Data Reviewed: I have personally reviewed following labs and imaging studies  CBC: Recent Labs  Lab 05/06/23 1131 05/06/23 2036 05/07/23 0428  WBC 8.3 8.1 8.4  HGB 10.3* 9.7* 10.0*  HCT 33.8* 31.6* 31.9*  MCV 82.0 81.2 81.0  PLT 182 183 184   Basic Metabolic Panel: Recent Labs  Lab 05/06/23 1131 05/07/23 0428  NA 138 137  K 4.2 4.1  CL 110 108  CO2 24 23  GLUCOSE 159* 101*  BUN 25* 18  CREATININE 0.88 0.76  CALCIUM 9.3 9.4   GFR: CrCl cannot be calculated (Unknown ideal weight.). Liver Function Tests: Recent Labs  Lab 05/06/23 1131  AST 47*  ALT 51*  ALKPHOS 65  BILITOT 0.6  PROT 6.5  ALBUMIN 3.0*   No results for input(s): "LIPASE", "AMYLASE" in the last 168 hours. No results for input(s): "AMMONIA" in the  last 168 hours. Coagulation Profile: No results for input(s): "INR", "PROTIME" in the last 168 hours. Cardiac Enzymes: No results for input(s): "CKTOTAL", "CKMB", "CKMBINDEX", "TROPONINI" in the last 168 hours. BNP (last 3 results) No results for input(s): "PROBNP" in the last 8760 hours. HbA1C: Recent Labs    05/06/23 1520  HGBA1C 6.4*   CBG: Recent Labs  Lab 05/06/23 1649 05/06/23 2146 05/07/23 0801  GLUCAP 102* 104* 129*    Lipid Profile: No results for input(s): "CHOL", "HDL", "LDLCALC", "TRIG", "CHOLHDL", "LDLDIRECT" in the last 72 hours. Thyroid Function Tests: No results for input(s): "TSH", "T4TOTAL", "FREET4", "T3FREE", "THYROIDAB" in the last 72 hours. Anemia Panel: No results for input(s): "VITAMINB12", "FOLATE", "FERRITIN", "TIBC", "IRON", "RETICCTPCT" in the last 72 hours. Sepsis Labs: No results for input(s): "PROCALCITON", "LATICACIDVEN" in the last 168 hours.  No results found for this or any previous visit (from the past 240 hour(s)).       Radiology Studies: No results found.      Scheduled Meds:  donepezil  5 mg Oral QHS   insulin aspart  0-15 Units Subcutaneous TID WC   insulin aspart  0-5 Units Subcutaneous QHS   pantoprazole (PROTONIX) IV  40 mg Intravenous Q12H   Continuous Infusions:  sodium chloride       LOS: 1 day    Time spent: 35 minutes    Ariyonna Twichell Hoover Brunette, DO [facility]  If 7PM-7AM, please contact night-coverage [email or web address] 05/07/2023, 9:44 AM

## 2023-05-07 NOTE — H&P (View-Only) (Signed)
Progress Note  Primary GI: Dr. Barron Alvine  LOS: 1 day   Chief Complaint: Rectal bleeding   Subjective   Patient with family at bedside, husband. Provided some of the history.  Husband states patient had 1 bloody bowel movement yesterday evening before bed and 1 bloody bowel movement this morning when she woke up.  Continues to be maroon in color.  Patient denies abdominal pain, nausea, vomiting.  Tolerating full liquids without difficulty   Objective   Vital signs in last 24 hours: Temp:  [98.1 F (36.7 C)-98.7 F (37.1 C)] 98.7 F (37.1 C) (06/07 0425) Pulse Rate:  [55-80] 68 (06/07 0425) Resp:  [15-16] 16 (06/07 0425) BP: (115-137)/(56-70) 115/56 (06/07 0425) SpO2:  [98 %-100 %] 98 % (06/07 0425) Last BM Date : 05/06/23 Last BM recorded by nurses in past 5 days Stool Type: Type 6 (Mushy consistency with ragged edges) (05/06/2023  4:00 PM)  General:   female in no acute distress  Heart:  Regular rate and rhythm; no murmurs Pulm: Clear anteriorly; no wheezing Abdomen: soft, nondistended, normal bowel sounds in all quadrants. Nontender without guarding. No organomegaly appreciated. Extremities:  No edema Neurologic: Alert, at baseline Psych:  Cooperative. Normal mood and affect.  Intake/Output from previous day: No intake/output data recorded. Intake/Output this shift: Total I/O In: 120 [P.O.:120] Out: -   Studies/Results: No results found.  Lab Results: Recent Labs    05/06/23 1131 05/06/23 2036 05/07/23 0428  WBC 8.3 8.1 8.4  HGB 10.3* 9.7* 10.0*  HCT 33.8* 31.6* 31.9*  PLT 182 183 184   BMET Recent Labs    05/06/23 1131 05/07/23 0428  NA 138 137  K 4.2 4.1  CL 110 108  CO2 24 23  GLUCOSE 159* 101*  BUN 25* 18  CREATININE 0.88 0.76  CALCIUM 9.3 9.4   LFT Recent Labs    05/06/23 1131  PROT 6.5  ALBUMIN 3.0*  AST 47*  ALT 51*  ALKPHOS 65  BILITOT 0.6   PT/INR No results for input(s): "LABPROT", "INR" in the last 72  hours.   Scheduled Meds:  donepezil  5 mg Oral QHS   insulin aspart  0-15 Units Subcutaneous TID WC   insulin aspart  0-5 Units Subcutaneous QHS   pantoprazole (PROTONIX) IV  40 mg Intravenous Q12H   Continuous Infusions:  sodium chloride        Patient profile:   Brandi Walsh is a 77 y.o. female with past medical history significant for diabetes, hyperlipidemia, Gilbert's, hypertension, Alzheimer's, presents for evaluation of rectal bleeding.   Patient switched from PPI to H2 blocker February 2024 and has chronic use of meloxicam   Impression:   GI bleed -Fecal occult positive. -Hgb 10.0, stable -BUN 18, creatinine 0.76, improved   Plan:   Plan for EGD tomorrow. I thoroughly discussed the procedures to include nature, alternatives, benefits, and risks including but not limited to bleeding, perforation, infection, anesthesia/cardiac and pulmonary complications. Patient provides understanding and gave verbal consent to proceed. Continue Protonix IV 40mg  BID Continue diet NPO at midnight Continue daily CBC and transfuse as needed to maintain HGB > 7  If any active brisk bleeding, CTA followed by IR embolization    Bayley M McMichael  05/07/2023, 9:37 AM    Attending physician's note   I have taken history, reviewed the chart and examined the patient. I performed a substantive portion of this encounter, including complete performance of at least one of the key components, in  conjunction with the APP. I agree with the Advanced Practitioner's note, impression and recommendations.   2 episodes of maroonish/black stools. HD stabe. Hb stable 10 Continue Protonix EGD in AM Discussed with husband and granddaughter   Edman Circle, MD [facility] [phone]

## 2023-05-07 NOTE — TOC Progression Note (Signed)
Transition of Care [facility]) - Progression Note    Patient Details  Name: Brandi Walsh MRN: [id number] Date of Birth: May 28, 1946  Transition of [facility]) CM/SW Contact  Beckie Busing, RN Phone Number:[phone]  05/07/2023, 1:51 PM  Clinical Narrative:    Brief assessment has been completed. Patient is from home with spouse and grandaughter. There are currently no needs noted. TOC will continue to follow for safety.      Barriers to Discharge: No Barriers Identified, Continued Medical Work up  Expected Discharge Plan and Services                                               Social Determinants of Health (SDOH) Interventions SDOH Screenings   Food Insecurity: No Food Insecurity (05/06/2023)  Housing: Low Risk  (05/06/2023)  Transportation Needs: No Transportation Needs (05/06/2023)  Utilities: Not At Risk (05/06/2023)  Depression (PHQ2-9): Low Risk  (10/06/2022)  Tobacco Use: Low Risk  (05/06/2023)    Readmission Risk Interventions     No data to display

## 2023-05-08 ENCOUNTER — Inpatient Hospital Stay (HOSPITAL_COMMUNITY): Payer: Medicare Other | Admitting: Anesthesiology

## 2023-05-08 ENCOUNTER — Encounter (HOSPITAL_COMMUNITY)
Admission: EM | Disposition: A | Discharge status: Home / Self Care | Payer: Self-pay | Source: Home / Self Care | Attending: Internal Medicine

## 2023-05-08 ENCOUNTER — Encounter (HOSPITAL_COMMUNITY): Payer: Self-pay | Admitting: Internal Medicine

## 2023-05-08 DIAGNOSIS — E119 Type 2 diabetes mellitus without complications: Secondary | ICD-10-CM

## 2023-05-08 DIAGNOSIS — K625 Hemorrhage of anus and rectum: Secondary | ICD-10-CM | POA: Diagnosis not present

## 2023-05-08 DIAGNOSIS — K449 Diaphragmatic hernia without obstruction or gangrene: Secondary | ICD-10-CM

## 2023-05-08 DIAGNOSIS — K259 Gastric ulcer, unspecified as acute or chronic, without hemorrhage or perforation: Secondary | ICD-10-CM

## 2023-05-08 DIAGNOSIS — I10 Essential (primary) hypertension: Secondary | ICD-10-CM

## 2023-05-08 DIAGNOSIS — K317 Polyp of stomach and duodenum: Secondary | ICD-10-CM

## 2023-05-08 DIAGNOSIS — K921 Melena: Secondary | ICD-10-CM | POA: Diagnosis not present

## 2023-05-08 HISTORY — PX: ESOPHAGOGASTRODUODENOSCOPY: SHX5428

## 2023-05-08 HISTORY — PX: BIOPSY: SHX5522

## 2023-05-08 LAB — GLUCOSE, CAPILLARY
Glucose-Capillary: 129 mg/dL — ABNORMAL HIGH (ref 70–99)
Glucose-Capillary: 104 mg/dL — ABNORMAL HIGH (ref 70–99)

## 2023-05-08 LAB — BASIC METABOLIC PANEL
Anion gap: 8 (ref 5–15)
BUN: 14 mg/dL (ref 8–23)
CO2: 23 mmol/L (ref 22–32)
Calcium: 9.5 mg/dL (ref 8.9–10.3)
Chloride: 105 mmol/L (ref 98–111)
Creatinine, Ser: 0.98 mg/dL (ref 0.44–1.00)
GFR, Estimated: 59 mL/min — ABNORMAL LOW (ref >60)
Glucose, Bld: 114 mg/dL — ABNORMAL HIGH (ref 70–99)
Potassium: 3.9 mmol/L (ref 3.5–5.1)
Sodium: 136 mmol/L (ref 135–145)

## 2023-05-08 LAB — MAGNESIUM: Magnesium: 1.9 mg/dL (ref 1.7–2.4)

## 2023-05-08 LAB — CBC
HCT: 31.9 % — ABNORMAL LOW (ref 36.0–46.0)
Hemoglobin: 9.9 g/dL — ABNORMAL LOW (ref 12.0–15.0)
MCH: 25.1 pg — ABNORMAL LOW (ref 26.0–34.0)
MCHC: 31 g/dL (ref 30.0–36.0)
MCV: 81 fL (ref 80.0–100.0)
Platelets: 168 10*3/uL (ref 150–400)
RBC: 3.94 MIL/uL (ref 3.87–5.11)
RDW: 18.9 % — ABNORMAL HIGH (ref 11.5–15.5)
WBC: 7.7 10*3/uL (ref 4.0–10.5)
nRBC: 0 % (ref 0.0–0.2)

## 2023-05-08 SURGERY — EGD (ESOPHAGOGASTRODUODENOSCOPY)
Anesthesia: Monitor Anesthesia Care

## 2023-05-08 MED ORDER — PROPOFOL 500 MG/50ML IV EMUL
INTRAVENOUS | Status: DC | PRN
  Administered 2023-05-08: 100 ug/kg/min via INTRAVENOUS
  Administered 2023-05-08: 40 mg via INTRAVENOUS

## 2023-05-08 MED ORDER — PANTOPRAZOLE SODIUM 40 MG PO TBEC: 40.0000 mg | DELAYED_RELEASE_TABLET | Freq: Two times a day (BID) | ORAL | Status: DC

## 2023-05-08 MED ORDER — LACTATED RINGERS IV SOLN
INTRAVENOUS | Status: AC | PRN
  Administered 2023-05-08: 10 mL/h via INTRAVENOUS

## 2023-05-08 MED ORDER — DEXMEDETOMIDINE HCL IN NACL 80 MCG/20ML IV SOLN
INTRAVENOUS | Status: DC | PRN
  Administered 2023-05-08: 8 ug via INTRAVENOUS

## 2023-05-08 MED ORDER — PANTOPRAZOLE SODIUM 40 MG PO TBEC: 40.0000 mg | DELAYED_RELEASE_TABLET | Freq: Two times a day (BID) | ORAL | 0 refills | Status: DC

## 2023-05-08 NOTE — Interval H&P Note (Signed)
History and Physical Interval Note:  05/08/2023 10:24 AM  Brandi Walsh  has presented today for surgery, with the diagnosis of GI bleed.  The various methods of treatment have been discussed with the patient and family. After consideration of risks, benefits and other options for treatment, the patient has consented to  Procedure(s): ESOPHAGOGASTRODUODENOSCOPY (EGD) (N/A) as a surgical intervention.  The patient's history has been reviewed, patient examined, no change in status, stable for surgery.  I have reviewed the patient's chart and labs.  Questions were answered to the patient's satisfaction.     Lynann Bologna

## 2023-05-08 NOTE — Discharge Summary (Signed)
Physician Discharge Summary  Brandi Walsh UEA:[id number] DOB: [birth year] DOA: 05/06/2023  PCP: [name], FNP  Admit date: 05/06/2023  Discharge date: 05/08/2023  Admitted From:Home  Disposition:  Home  Recommendations for Outpatient Follow-up:  Follow up with PCP in 1-2 weeks Remain on Protonix BID as prescribed for now and follow up with GI in 6-8 weeks Refrain from further NSAID use Continue other home medications as prior  Home Health:None  Equipment/Devices:None  Discharge Condition:Stable  CODE STATUS: DNR  Diet recommendation: Heart Healthy/Carb Modified  Brief/Interim Summary:  Brandi Walsh is a 77 y.o. female with medical history significant for diet-controlled diabetes, hypertension, hyperlipidemia, Alzheimer's dementia being admitted to the hospital with melena and blood loss anemia.  GI performed EGD on 6/8 with nonbleeding ulcers noted. Recommendations are to stay on PPI BID for now and follow up outpatient. Hemoglobin has remained stable.  Discharge Diagnoses:  Principal Problem:   Melena  Acute blood loss anemia in the setting of GI bleed.  Discharge Instructions  Discharge Instructions     Ambulatory referral to Gastroenterology   Complete by: As directed    Hospital follow up for EGD in 6-8 weeks   What is the reason for referral?: Other   Diet - low sodium heart healthy   Complete by: As directed    Increase activity slowly   Complete by: As directed       Allergies as of 05/08/2023       Reactions   Lovastatin    REACTION: Rash        Medication List     STOP taking these medications    famotidine 20 MG tablet Commonly known as: Pepcid   meloxicam 15 MG tablet Commonly known as: MOBIC       TAKE these medications    alendronate 70 MG tablet Commonly known as: FOSAMAX Take 70 mg by mouth once a week.   donepezil 5 MG tablet Commonly known as: ARICEPT Take 1 tablet daily What changed:  how much to  take how to take this when to take this additional instructions   escitalopram 10 MG tablet Commonly known as: LEXAPRO TAKE 1 & 1/2 (ONE & ONE-HALF) TABLETS BY MOUTH ONCE DAILY What changed:  how much to take how to take this when to take this additional instructions   losartan 50 MG tablet Commonly known as: COZAAR Take 1 tablet (50 mg total) by mouth daily.   mupirocin cream 2 % Commonly known as: BACTROBAN Apply 1 Application topically 2 (two) times daily.   pantoprazole 40 MG tablet Commonly known as: PROTONIX Take 1 tablet (40 mg total) by mouth 2 (two) times daily.   rosuvastatin 40 MG tablet Commonly known as: CRESTOR Take 1 tablet (40 mg total) by mouth daily.   Vitamin D 50 MCG (2000 UT) tablet Take 2,000 Units by mouth daily.        Follow-up Information     [name], FNP. Schedule an appointment as soon as possible for a visit in 1 week(s).   Specialty: Internal Medicine Contact information: [address] [place] [address] [phone]                Allergies  Allergen Reactions   Lovastatin     REACTION: Rash    Consultations: GI   Procedures/Studies: No results found.   Discharge Exam: Vitals:   05/08/23 1100 05/08/23 1110  BP: (!) 93/54 93/70  Pulse: 84 68  Resp: Marland Kitchen)  23 12  Temp:    SpO2: 100% 98%   Vitals:   05/08/23 0951 05/08/23 1054 05/08/23 1100 05/08/23 1110  BP: 123/67 (!) 102/51 (!) 93/54 93/70  Pulse: 62 71 84 68  Resp: 12 18 (!) 23 12  Temp: (!) 97.1 F (36.2 C) (!) 97.3 F (36.3 C)    TempSrc: Temporal Temporal    SpO2: 98% 100% 100% 98%  Weight: 61.2 kg     Height: 5' (1.524 m)       General: Pt is alert, awake, not in acute distress Cardiovascular: RRR, S1/S2 +, no rubs, no gallops Respiratory: CTA bilaterally, no wheezing, no rhonchi Abdominal: Soft, NT, ND, bowel sounds + Extremities: no edema, no cyanosis    The results of significant diagnostics from this  hospitalization (including imaging, microbiology, ancillary and laboratory) are listed below for reference.     Microbiology: No results found for this or any previous visit (from the past 240 hour(s)).   Labs: BNP (last 3 results) No results for input(s): "BNP" in the last 8760 hours. Basic Metabolic Panel: Recent Labs  Lab 05/06/23 1131 05/07/23 0428 05/08/23 0415  NA 138 137 136  K 4.2 4.1 3.9  CL 110 108 105  CO2 24 23 23   GLUCOSE 159* 101* 114*  BUN 25* 18 14  CREATININE 0.88 0.76 0.98  CALCIUM 9.3 9.4 9.5  MG  --   --  1.9   Liver Function Tests: Recent Labs  Lab 05/06/23 1131  AST 47*  ALT 51*  ALKPHOS 65  BILITOT 0.6  PROT 6.5  ALBUMIN 3.0*   No results for input(s): "LIPASE", "AMYLASE" in the last 168 hours. No results for input(s): "AMMONIA" in the last 168 hours. CBC: Recent Labs  Lab 05/06/23 1131 05/06/23 2036 05/07/23 0428 05/08/23 0415  WBC 8.3 8.1 8.4 7.7  HGB 10.3* 9.7* 10.0* 9.9*  HCT 33.8* 31.6* 31.9* 31.9*  MCV 82.0 81.2 81.0 81.0  PLT 182 183 184 168   Cardiac Enzymes: No results for input(s): "CKTOTAL", "CKMB", "CKMBINDEX", "TROPONINI" in the last 168 hours. BNP: Invalid input(s): "POCBNP" CBG: Recent Labs  Lab 05/07/23 1136 05/07/23 1633 05/07/23 2131 05/08/23 0750 05/08/23 1203  GLUCAP 125* 101* 160* 129* 104*   D-Dimer No results for input(s): "DDIMER" in the last 72 hours. Hgb A1c Recent Labs    05/06/23 1520  HGBA1C 6.4*   Lipid Profile No results for input(s): "CHOL", "HDL", "LDLCALC", "TRIG", "CHOLHDL", "LDLDIRECT" in the last 72 hours. Thyroid function studies No results for input(s): "TSH", "T4TOTAL", "T3FREE", "THYROIDAB" in the last 72 hours.  Invalid input(s): "FREET3" Anemia work up No results for input(s): "VITAMINB12", "FOLATE", "FERRITIN", "TIBC", "IRON", "RETICCTPCT" in the last 72 hours. Urinalysis    Component Value Date/Time   COLORURINE YELLOW 07/23/2020 1010   APPEARANCEUR CLEAR 07/23/2020  1010   LABSPEC 1.015 07/23/2020 1010   PHURINE 6.0 07/23/2020 1010   GLUCOSEU NEGATIVE 07/23/2020 1010   GLUCOSEU NEGATIVE 09/19/2019 1631   HGBUR 1+ (A) 07/23/2020 1010   BILIRUBINUR NEGATIVE 07/03/2020 1855   KETONESUR NEGATIVE 07/23/2020 1010   PROTEINUR 2+ (A) 07/23/2020 1010   UROBILINOGEN 0.2 09/19/2019 1631   NITRITE NEGATIVE 07/23/2020 1010   LEUKOCYTESUR NEGATIVE 07/23/2020 1010   Sepsis Labs Recent Labs  Lab 05/06/23 1131 05/06/23 2036 05/07/23 0428 05/08/23 0415  WBC 8.3 8.1 8.4 7.7   Microbiology No results found for this or any previous visit (from the past 240 hour(s)).   Time coordinating discharge: 35 minutes  SIGNED:   Erick Blinks, DO [facility] 05/08/2023, 1:26 PM  If 7PM-7AM, please contact night-coverage [email or web address]

## 2023-05-08 NOTE — Anesthesia Postprocedure Evaluation (Signed)
Anesthesia Post Note  Patient: Brandi Walsh  Procedure(s) Performed: ESOPHAGOGASTRODUODENOSCOPY (EGD) BIOPSY     Patient location during evaluation: Endoscopy Anesthesia Type: MAC Level of consciousness: awake and confused Pain management: pain level controlled Vital Signs Assessment: post-procedure vital signs reviewed and stable Respiratory status: spontaneous breathing, nonlabored ventilation and respiratory function stable Cardiovascular status: stable and blood pressure returned to baseline Postop Assessment: no apparent nausea or vomiting Anesthetic complications: no  No notable events documented.  Last Vitals:  Vitals:   05/08/23 1100 05/08/23 1110  BP: (!) 93/54 93/70  Pulse: 84 68  Resp: (!) 23 12  Temp:    SpO2: 100% 98%    Last Pain:  Vitals:   05/08/23 1110  TempSrc:   PainSc: 0-No pain                 Anaih Brander,W. EDMOND

## 2023-05-08 NOTE — Op Note (Addendum)
[facility] Patient Name: Brandi Walsh Procedure Date: 05/08/2023 MRN: [id number] Attending MD: Lynann Bologna , MD, [id number] Date of Birth: June 26, 1946 CSN: [id number] Age: 77 Admit Type: Inpatient Procedure:                Upper GI endoscopy Indications:              Recent gastrointestinal bleeding. pt on mobic,                            stopped PPis Providers:                Lynann Bologna, MD, Roselie Awkward, RN, Martha Clan, RN, Marja Kays, Technician Referring MD:              Medicines:                Monitored Anesthesia Care Complications:            No immediate complications. Estimated Blood Loss:     Estimated blood loss: none. Procedure:                Pre-Anesthesia Assessment:                           - Prior to the procedure, a History and Physical                            was performed, and patient medications and                            allergies were reviewed. The patient's tolerance of                            previous anesthesia was also reviewed. The risks                            and benefits of the procedure and the sedation                            options and risks were discussed with the patient.                            All questions were answered, and informed consent                            was obtained. Prior Anticoagulants: The patient has                            taken no anticoagulant or antiplatelet agents. ASA                            Grade Assessment: III - A patient with severe  systemic disease. After reviewing the risks and                            benefits, the patient was deemed in satisfactory                            condition to undergo the procedure.                           After obtaining informed consent, the endoscope was                            passed under direct vision. Throughout the                            procedure, the  patient's blood pressure, pulse, and                            oxygen saturations were monitored continuously. The                            [id number]-H190 ([id number]) Olympus endoscope was introduced                            through the mouth, and advanced to the second part                            of duodenum. The upper GI endoscopy was                            accomplished without difficulty. The patient                            tolerated the procedure well. Scope In: Scope Out: Findings:      The examined esophagus was normal.      A 2 cm hiatal hernia was present.      Two non-bleeding cratered gastric ulcers with no stigmata of bleeding       were found in the gastric antrum and at the pylorus. The largest lesion       was 8 mm in largest dimension. No active bleeding. Biopsies were taken       with a cold forceps for histology.      A single 10 mm semi-pedunculated polyp (erythematous head) with no       bleeding and no stigmata of recent bleeding was found in the gastric       antrum. Biopsies were taken with a cold forceps for histology. Another       small 4 mm polyp was noted in the body of the stomach. These were not       removed to avoid complicating current clinical picture.      The examined duodenum was normal. Impression:               - Normal esophagus.                           -  2 cm hiatal hernia.                           - Non-bleeding gastric ulcers with no stigmata of                            bleeding. Biopsied.                           - A single gastric polyp. Biopsied.                           - Normal examined duodenum. Moderate Sedation:      Not Applicable - Patient had care per Anesthesia. Recommendation:           - Return patient to hospital ward for ongoing care.                           - Patient likely had upper GI bleeding. Use                            Protonix (pantoprazole) 40 mg PO BID x 8 weeks,                            then  QD                           - Avoid nonsteroidals.                           - Await pathology results.                           - Return to GI clinic in 6-8 weeks.                           - Ideally would recommend repeating EGD in 12 weeks                            to ensure healing of ulcers and for polypectomy                            (depending upon above biopsy results). It certainly                            will depend upon family discussion at follow-up                            visit.                           - Will sign off for now                           - The findings and recommendations were discussed  with the patient's GD. Procedure Code(s):        --- Professional ---                           [phone], Esophagogastroduodenoscopy, flexible,                            transoral; with biopsy, single or multiple Diagnosis Code(s):        --- Professional ---                           K44.9, Diaphragmatic hernia without obstruction or                            gangrene                           K25.9, Gastric ulcer, unspecified as acute or                            chronic, without hemorrhage or perforation                           K31.7, Polyp of stomach and duodenum                           K92.2, Gastrointestinal hemorrhage, unspecified CPT copyright 2022 [employer]. All rights reserved. The codes documented in this report are preliminary and upon coder review may  be revised to meet current compliance requirements. Lynann Bologna, MD 05/08/2023 11:05:13 AM This report has been signed electronically. Number of Addenda: 0

## 2023-05-08 NOTE — Transfer of Care (Signed)
Immediate Anesthesia Transfer of Care Note  Patient: Brandi Walsh  Procedure(s) Performed: Procedure(s): ESOPHAGOGASTRODUODENOSCOPY (EGD) (N/A) BIOPSY  Patient Location: PACU and Endoscopy Unit  Anesthesia Type:MAC  Level of Consciousness: awake, alert  and oriented  Airway & Oxygen Therapy: Patient Spontanous Breathing and Patient connected to nasal cannula oxygen  Post-op Assessment: Report given to RN and Post -op Vital signs reviewed and stable  Post vital signs: Reviewed and stable  Last Vitals:  Vitals:   05/08/23 0951 05/08/23 1054  BP: 123/67 (!) 102/51  Pulse: 62 71  Resp: 12 18  Temp: (!) 36.2 C (!) 36.3 C  SpO2: 98% 100%    Complications: No apparent anesthesia complications

## 2023-05-08 NOTE — Anesthesia Preprocedure Evaluation (Addendum)
Anesthesia Evaluation  Patient identified by MRN, date of birth, ID band Patient confused    Reviewed: Allergy & Precautions, H&P , NPO status , Patient's Chart, lab work & pertinent test results  Airway Mallampati: III  TM Distance: >3 FB Neck ROM: Full    Dental no notable dental hx. (+) Partial Lower, Partial Upper, Dental Advisory Given   Pulmonary neg pulmonary ROS   Pulmonary exam normal breath sounds clear to auscultation       Cardiovascular hypertension, Pt. on medications  Rhythm:Regular Rate:Normal     Neuro/Psych    Depression   Dementia negative neurological ROS     GI/Hepatic Neg liver ROS,GERD  Medicated,,  Endo/Other  diabetes    Renal/GU negative Renal ROS  negative genitourinary   Musculoskeletal   Abdominal   Peds  Hematology negative hematology ROS (+)   Anesthesia Other Findings   Reproductive/Obstetrics negative OB ROS                             Anesthesia Physical Anesthesia Plan  ASA: 2  Anesthesia Plan: MAC   Post-op Pain Management: Minimal or no pain anticipated   Induction: Intravenous  PONV Risk Score and Plan: 2 and Propofol infusion  Airway Management Planned: Simple Face Mask and Natural Airway  Additional Equipment:   Intra-op Plan:   Post-operative Plan:   Informed Consent: I have reviewed the patients History and Physical, chart, labs and discussed the procedure including the risks, benefits and alternatives for the proposed anesthesia with the patient or authorized representative who has indicated his/her understanding and acceptance.   Patient has DNR.  Discussed DNR with power of attorney and Suspend DNR.   Dental advisory given  Plan Discussed with: CRNA  Anesthesia Plan Comments:        Anesthesia Quick Evaluation

## 2023-05-10 ENCOUNTER — Telehealth: Payer: Self-pay

## 2023-05-10 NOTE — Transitions of Care (Post Inpatient/ED Visit) (Unsigned)
   05/10/2023  Name: Brandi Walsh MRN: [id number] DOB: 1946-02-07  Today's TOC FU Call Status: Today's TOC FU Call Status:: Unsuccessul Call (1st Attempt) Unsuccessful Call (1st Attempt) Date: 05/10/23  Attempted to reach the patient regarding the most recent Inpatient/ED visit.  Follow Up Plan: Additional outreach attempts will be made to reach the patient to complete the Transitions of Care (Post Inpatient/ED visit) call.   Signature Karena Addison, LPN [facility] Nurse Health Advisor Direct Dial [phone]

## 2023-05-11 NOTE — Transitions of Care (Post Inpatient/ED Visit) (Signed)
   05/11/2023  Name: Brandi Walsh MRN: [id number] DOB: 15-May-1946  Today's TOC FU Call Status: Today's TOC FU Call Status:: Successful TOC FU Call Competed Unsuccessful Call (1st Attempt) Date: 05/10/23 [facility] FU Call Complete Date: 05/11/23  Transition Care Management Follow-up Telephone Call Date of Discharge: 05/08/23 Discharge Facility: [facility]) Type of Discharge: Inpatient Admission Primary Inpatient Discharge Diagnosis:: GI bleed How have you been since you were released from the hospital?: Better Any questions or concerns?: No  Items Reviewed: Did you receive and understand the discharge instructions provided?: Yes Medications obtained,verified, and reconciled?: Yes (Medications Reviewed) Any new allergies since your discharge?: No Dietary orders reviewed?: Yes Do you have support at home?: Yes People in Home: grandchild(ren)  Medications Reviewed Today: Medications Reviewed Today     Reviewed by Karena Addison, LPN (Licensed Practical Nurse) on 05/11/23 at 1537  Med List Status: <None>   Medication Order Taking? Sig Documenting Provider Last Dose Status Informant  alendronate (FOSAMAX) 70 MG tablet [id number] No Take 70 mg by mouth once a week. [provider] Past Week Active Family Member, Pharmacy Records  Cholecalciferol (VITAMIN D) 50 MCG (2000 UT) tablet [id number] No Take 2,000 Units by mouth daily. [provider] 05/06/2023 Active Family Member, Pharmacy Records  donepezil (ARICEPT) 5 MG tablet [id number] No Take 1 tablet daily  Patient taking differently: Take 10 mg by mouth daily before breakfast.   [name], PA-C 05/06/2023 Active Family Member, Pharmacy Records  escitalopram (LEXAPRO) 10 MG tablet [id number] No TAKE 1 & 1/2 (ONE & ONE-HALF) TABLETS BY MOUTH ONCE DAILY  Patient taking differently: Take 15 mg by mouth daily.   [name], PMHNP 05/06/2023 Active Family Member, Pharmacy Records  losartan (COZAAR) 50 MG tablet  [id number] No Take 1 tablet (50 mg total) by mouth daily. [name], FNP 05/06/2023 Active Family Member, Pharmacy Records  mupirocin cream (BACTROBAN) 2 % [id number] No Apply 1 Application topically 2 (two) times daily. [name], DPM More than a month Active Family Member, Pharmacy Records  pantoprazole (PROTONIX) 40 MG tablet [id number]  Take 1 tablet (40 mg total) by mouth 2 (two) times daily. Sherryll Burger, Pratik D, DO  Active   rosuvastatin (CRESTOR) 40 MG tablet [id number] No Take 1 tablet (40 mg total) by mouth daily. [name], FNP 05/06/2023 Active Family Member, Pharmacy Records            Home Care and Equipment/Supplies: Were Home Health Services Ordered?: NA Any new equipment or medical supplies ordered?: NA  Functional Questionnaire: Do you need assistance with bathing/showering or dressing?: No Do you need assistance with meal preparation?: No Do you need assistance with eating?: No Do you have difficulty maintaining continence: No Do you need assistance with getting out of bed/getting out of a chair/moving?: No Do you have difficulty managing or taking your medications?: No  Follow up appointments reviewed: PCP Follow-up appointment confirmed?: NA Specialist Hospital Follow-up appointment confirmed?: Yes Date of Specialist follow-up appointment?: 05/13/23 Follow-Up Specialty Provider:: GI Do you need transportation to your follow-up appointment?: No Do you understand care options if your condition(s) worsen?: Yes-patient verbalized understanding    SIGNATURE Karena Addison, LPN [facility] Nurse Health Advisor Direct Dial [phone]

## 2023-05-12 ENCOUNTER — Encounter (HOSPITAL_COMMUNITY): Payer: Self-pay | Admitting: Gastroenterology

## 2023-05-12 LAB — SURGICAL PATHOLOGY

## 2023-05-13 ENCOUNTER — Ambulatory Visit: Payer: Medicare Other | Admitting: Nurse Practitioner

## 2023-05-13 NOTE — Progress Notes (Deleted)
Assessment and Plan   Primary BJ:YNWG [name], MD  Brief Narrative:  77 y.o. yo female whose past medical history includes but is not necessarily limited to diabetes, HTN, osteoporosis, anxiety, depression, Alzheimer's disease with dementia and memory loss, hyperparathyroidism,  GERD   Recent upper GI bleed / gastric ulcers.   Colon cancer screening. Granddaughter doesn't want her to under a colonoscopy    History of Present Illness   Chief complaint:    Hospitalized earlier this month with melena and anemia  Hgb 10.3 from baseline of around 13. Hgb eventually drifted to 9.9. Found to have non-bleeding gastric ulcers. Bx negative for H.pylori. Apparently was taking Meloxicam      Previous GI Endoscopies / Labs / Imaging   04/05/23 EGD for GI bleed -Normal esophagus - 2 cm hiatal hernia. - Non-bleeding gastric ulcers with no stigmata of bleeding. Biopsied. - A single gastric polyp. Biopsied. - Normal examined duodenum. PPI x 8 w then qd Poss repeat egd in 12 w   FINAL MICROSCOPIC DIAGNOSIS:   A. GASTRIC ULCER, BIOPSY:  - Antral mucosa with mild chronic inflammation and focal intestinal  metaplasia  - Immunohistochemical stain is negative for Helicobacter organisms  - Negative for dysplasia   B. GASTRIC POLYP, BIOPSY:  - Hyperplastic polyp with reactive changes, chronic inflammation and  erosion  - Negative for Helicobacter organisms on HE stain  - Negative for intestinal metaplasia or dysplasia  - See comment          Latest Ref Rng & Units 05/06/2023   11:31 AM 10/06/2022    8:59 AM 04/03/2022    9:56 AM  Hepatic Function  Total Protein 6.5 - 8.1 g/dL 6.5  6.0  6.2   Albumin 3.5 - 5.0 g/dL 3.0  3.5  3.7   AST 15 - 41 U/L 47  23  35   ALT 0 - 44 U/L 51  21  39   Alk Phosphatase 38 - 126 U/L 65  74  78   Total Bilirubin 0.3 - 1.2 mg/dL 0.6  1.0  0.9        Latest Ref Rng & Units 05/08/2023    4:15 AM 05/07/2023    4:28 AM 05/06/2023    8:36 PM  CBC   WBC 4.0 - 10.5 K/uL 7.7  8.4  8.1   Hemoglobin 12.0 - 15.0 g/dL 9.9  95.6  9.7   Hematocrit 36.0 - 46.0 % 31.9  31.9  31.6   Platelets 150 - 400 K/uL 168  184  183         Past Medical History:  Diagnosis Date   Alzheimer's disease (HCC)    COUGH DUE TO ACE INHIBITORS 11/25/2009   Qualifier: Diagnosis of  By: Everardo All MD, Sean A    Depression    DIABETES MELLITUS, TYPE II 10/13/2007   Qualifier: Diagnosis of  By: Tyrone Apple, Lucy     HYPERLIPIDEMIA 10/13/2007   Qualifier: Diagnosis of  By: Samara Snide     HYPERTENSION 10/13/2007   Qualifier: Diagnosis of  By: Samara Snide     MENOPAUSAL SYNDROME 10/13/2007   Qualifier: Diagnosis of  By: Tyrone Apple, Lucy     OSTEOPOROSIS 10/13/2007   Qualifier: Diagnosis of  By: Samara Snide      Past Surgical History:  Procedure Laterality Date   ABDOMINAL HYSTERECTOMY     BIOPSY  05/08/2023   Procedure: BIOPSY;  Surgeon: Lynann Bologna, MD;  Location: [facility]  ENDOS[facility];  Service: Gastroenterology;;   BREAST BIOPSY  08/2002   ESOPHAGOGASTRODUODENOSCOPY N/A 05/08/2023   Procedure: ESOPHAGOGASTRODUODENOSCOPY (EGD);  Surgeon: Lynann Bologna, MD;  Location: [facility];  Service: Gastroenterology;  Laterality: N/A;   TONSILLECTOMY      Current Medications, Allergies, Family History and Social History were reviewed in [employer] record.     Current Outpatient Medications  Medication Sig Dispense Refill   alendronate (FOSAMAX) 70 MG tablet Take 70 mg by mouth once a week.     Cholecalciferol (VITAMIN D) 50 MCG (2000 UT) tablet Take 2,000 Units by mouth daily.     donepezil (ARICEPT) 5 MG tablet Take 1 tablet daily (Patient taking differently: Take 10 mg by mouth daily before breakfast.) 90 tablet 3   escitalopram (LEXAPRO) 10 MG tablet TAKE 1 & 1/2 (ONE & ONE-HALF) TABLETS BY MOUTH ONCE DAILY (Patient taking differently: Take 15 mg by mouth daily.) 135 tablet 3   losartan (COZAAR) 50 MG tablet Take 1 tablet (50 mg  total) by mouth daily. 90 tablet 3   mupirocin cream (BACTROBAN) 2 % Apply 1 Application topically 2 (two) times daily. 15 g 0   pantoprazole (PROTONIX) 40 MG tablet Take 1 tablet (40 mg total) by mouth 2 (two) times daily. 120 tablet 0   rosuvastatin (CRESTOR) 40 MG tablet Take 1 tablet (40 mg total) by mouth daily. 90 tablet 3   No current facility-administered medications for this visit.    Review of Systems: No chest pain. No shortness of breath. No urinary complaints.    Physical Exam  Wt Readings from Last 3 Encounters:  05/08/23 135 lb (61.2 kg)  01/28/23 145 lb 2 oz (65.8 kg)  11/12/22 148 lb (67.1 kg)    There were no vitals taken for this visit. Constitutional:  Pleasant, generally well appearing ***female in no acute distress. Psychiatric: Normal mood and affect. Behavior is normal. EENT: Pupils normal.  Conjunctivae are normal. No scleral icterus. Neck supple.  Cardiovascular: Normal rate, regular rhythm.  Pulmonary/chest: Effort normal and breath sounds normal. No wheezing, rales or rhonchi. Abdominal: Soft, nondistended, nontender. Bowel sounds active throughout. There are no masses palpable. No hepatomegaly. Neurological: Alert and oriented to person place and time.  Extremities: *** edema Skin: Skin is warm and dry. No rashes noted.  Willette Cluster, NP  05/13/2023, 8:04 AM  Cc:  [name],*

## 2023-05-14 ENCOUNTER — Ambulatory Visit: Payer: Medicare Other | Admitting: Physician Assistant

## 2023-05-14 ENCOUNTER — Encounter: Payer: Self-pay | Admitting: Physician Assistant

## 2023-05-14 VITALS — BP 120/80 | HR 76 | Resp 18 | Ht 62.5 in | Wt 150.0 lb

## 2023-05-14 DIAGNOSIS — R413 Other amnesia: Secondary | ICD-10-CM | POA: Diagnosis not present

## 2023-05-14 MED ORDER — DONEPEZIL HCL 10 MG PO TABS: 10.0000 mg | ORAL_TABLET | Freq: Every day | ORAL | 3 refills | Status: DC

## 2023-05-14 NOTE — Progress Notes (Signed)
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 RH female with a history of hypertension, hyperlipidemia, depression, anxiety, parkinsonism, with Mild cognitive impairment likely due to Alzheimer's disease as per Neuropsychological evaluation in 01/2020  presenting today in follow-up for evaluation of memory loss. Patient is on donepezil 10 mg daily. Memory is stable. Patient is able to participate on his IADLs to her ability, needs assistance with bathing and dressing.    Recommendations:   Follow up in 6  months.  Repeat Neuropsych testing for clarity of diagnosis and disease trajectory scheduled for 07/2023  Continue donepezil 10 mg daily. Side effects were discussed   Repeat MRI brain prior to the Neuropsych evaluation for updated view.   Recommend good control of cardiovascular risk factors Continue to control mood as per PCP and [facility] ([facility]) Tremors are stable, no medications I.e Sinemet are indicated at this time. Continue PT for strength and balance    Subjective:   This patient is accompanied in the office by her husband  and grand daughter who supplement  the history. Previous records as well as any outside records available were reviewed prior to todays visit.   Patient was last seen on 01/15/23 with MMSE 27/30      Any changes in memory since last visit? "About the same"-husband says. Able to remember for the most part, recent conversations and names or people that she knows. She enjoys watching TV game shows, short readings.  LTM is good.  repeats oneself?  Endorsed Disoriented when walking into a room? Occasionally she may not recognize her own house, as before.    Leaving objects in unusual places?  Patient denies   Wandering behavior?   denies   Any personality changes since last visit?   Denies. She sees Brandi Walsh as outpatient.   Any worsening depression?: denies   Hallucinations or paranoia?   denies   Seizures?   denies    Any sleep changes?  Sleeps well. Denies  vivid dreams, REM behavior or sleepwalking   Sleep apnea?   denies   Any hygiene concerns?   She has to be reminded "refuses to get in the shower"-husband says.  Independent of bathing and dressing? Needs assistance due to balance issues Does the patient needs help with medications? Granddaughter is in charge   Who is in charge of the finances?  Husband is in charge     Any changes in appetite? " She likes sweets", otherwise she eats anything one presents to her Patient have trouble swallowing?  denies   Does the patient cook? No    Any headaches?   denies   Vision changes? denies Chronic back pain  denies   Ambulates with difficulty? "She is more stiff in the morning, she walks slowly" Has been referred  PT    Recent falls or head injuries?   denies     Unilateral weakness, numbness or tingling?  denies   Any tremors? 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  She has a habit of keeping her right hand to there head as per husband report  Any anosmia?   denies   Any incontinence of urine? " Endorsed, "sometimes she tells me she has to go, sometimes she doesn't . She can hold it for hours sometimes" but historically she used to hold it from [place] to [place] "  wears diapers Any bowel dysfunction?  Denies. Had IB recently, now resolved      Patient lives  with husband, granddaughter and her children.  Does the patient drive? No.    MRI of the brain in August 2021 personally reviewed was remarkable for bilateral hypoccampal atrophy, mild chronic microvascular disease and mild diffuse atrophy.     History on Initial Assessment 06/25/2020: This is a 77 year old left-handed woman with a history of hypertension, hyperlipidemia, depression, anxiety, presenting for evaluation of recently diagnosed dementia on Neuropsychological testing done last 01/2020. Records  reviewed. She reports her memory may not be so great. Her granddaughter Brandi Walsh provides additional information. Brandi Walsh reports an incident in December 2018 when she had visual and auditory hallucinations and was admitted to [facility] and started on psychiatric medication. She had Neuropsychological testing in April 2018 with Dr. 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 Cognitive testing indicated possible very mild global attenuation of cognitive function relative to estimated premorbid intellectual abilities, with the only area of true impairment being phonemic verbal fluency and memory retrieval afor newly learned auditory information, probable mild frontal-subcortical dysfunction. Thought process was still mildly disorganized, unclear if longstanding and/or related to primary psychiatric disorder. She was no longer having delusions at that time. Brandi Walsh reports that since then, there has been a steady decline with her memory. Psychiatric disorder had been stable for 2 years with adjustment in Lexapro and Olanzapine. Over the past 6 months, Brandi Walsh noticed a biggest turning point in her cognition. She also noticed slower movements. She is overall more anxious. Her psychiatrist has advised monitoring for tardive dyskinesia, her lower jaw movements have progressively worsened. Brandi Walsh was not sure about medication adherence, so she has been helping the last few weeks. She noted waxing and waning symptoms and requested repeat Neuropsychological testing. Records from Dr. 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 Cholinesterase  inhibitor was recommended, she was started on Donepezil 5mg  daily by her Psychiatrist, no side effects.    She lives with her husband. She states her memory "may not be so great." Brandi Walsh reports that she has always been very sharp, so the slightest things she does not remember would bother her. She denies getting lost driving but drives rarely, around once a month. She and her husband do bills together, there may be some missed bills. Her husband manages meals. She is slow to answer questions but answers appropriately and thoughtfully. She has some dizziness in the morning and endorses vertigo when lying flat. She has some blurred vision. She has had some pain between her shoulder pain for a few weeks. She reports numbness in both calves. She has had bilateral leg swelling even before Donepezil was started. She has occasional constipation. She has occasional hand tremors when anxious. No headaches, dysarthria/dysphagia, focal weakness, anosmia. Sleep is okay. 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 She reports mood is good. Brandi Walsh notes she worries/stresses too much. Everyday is a little different with how she feels, how quickly she is moving. Sometimes she is a bit more agitated and does not want to eat, saying her partials don't fit well or her things get stuck (she is s/p esophageal dilatation). Brandi Walsh notes she has  always been thoughtful but not as slow. She has always been very precise and detail-oriented, which is not changed, but she is not usually as slow. No visual/auditory hallucinations. She is independent with dressing and bathing but gets stuck sometimes putting on clothes. If she gets stuck in a thought, she freezes and cannot move. No recent changes in psychiatric medication, a year ago Lexapro was increased to 10mg  to increase motivation, which has not helped much. Brandi Walsh notes there was more lucidity to her surroundings at that time, which is not present now. No family history of  dementia, no history of concussions or alcohol use.   Diagnostic Data: MRI brain with and without contrast done 06/2020 did not show any acute changes. There was mild diffuse atrophy, bilateral hippocampal atrophy, mild chronic microvascular disease.     Past Medical History:  Diagnosis Date   Alzheimer's disease (HCC)    COUGH DUE TO ACE INHIBITORS 11/25/2009   Qualifier: Diagnosis of  By: Everardo All MD, Sean A    Depression    DIABETES MELLITUS, TYPE II 10/13/2007   Qualifier: Diagnosis of  By: Charlsie Quest RMA, Lucy     HYPERLIPIDEMIA 10/13/2007   Qualifier: Diagnosis of  By: Tyrone Apple, Lucy     HYPERTENSION 10/13/2007   Qualifier: Diagnosis of  By: Tyrone Apple, Lucy     MENOPAUSAL SYNDROME 10/13/2007   Qualifier: Diagnosis of  By: Charlsie Quest RMA, Lucy     OSTEOPOROSIS 10/13/2007   Qualifier: Diagnosis of  By: Samara Snide       Past Surgical History:  Procedure Laterality Date   ABDOMINAL HYSTERECTOMY     BIOPSY  05/08/2023   Procedure: BIOPSY;  Surgeon: Lynann Bologna, MD;  Location: [facility] ENDOSCOPY;  Service: Gastroenterology;;   BREAST BIOPSY  08/2002   ESOPHAGOGASTRODUODENOSCOPY N/A 05/08/2023   Procedure: ESOPHAGOGASTRODUODENOSCOPY (EGD);  Surgeon: Lynann Bologna, MD;  Location: [facility];  Service: Gastroenterology;  Laterality: N/A;   TONSILLECTOMY       PREVIOUS MEDICATIONS:   CURRENT MEDICATIONS:  Outpatient Encounter Medications as of 05/14/2023  Medication Sig   alendronate (FOSAMAX) 70 MG tablet Take 70 mg by mouth once a week.   Cholecalciferol (VITAMIN D) 50 MCG (2000 UT) tablet Take 2,000 Units by mouth daily.   donepezil (ARICEPT) 5 MG tablet Take 1 tablet daily (Patient taking differently: Take 10 mg by mouth daily before breakfast.)   escitalopram (LEXAPRO) 10 MG tablet TAKE 1 & 1/2 (ONE & ONE-HALF) TABLETS BY MOUTH ONCE DAILY (Patient taking differently: Take 15 mg by mouth daily.)   losartan (COZAAR) 50 MG tablet Take 1 tablet (50 mg total) by mouth daily.   mupirocin  cream (BACTROBAN) 2 % Apply 1 Application topically 2 (two) times daily.   pantoprazole (PROTONIX) 40 MG tablet Take 1 tablet (40 mg total) by mouth 2 (two) times daily.   rosuvastatin (CRESTOR) 40 MG tablet Take 1 tablet (40 mg total) by mouth daily.   No facility-administered encounter medications on file as of 05/14/2023.     Objective:     PHYSICAL EXAMINATION:    VITALS:   Vitals:   05/14/23 0902  BP: 120/80  Pulse: 76  Resp: 18  SpO2: 98%  Weight: 150 lb (68 kg)  Height: 5' 2.5" (1.588 m)    GEN:  The patient appears stated age and is in NAD. HEENT:  Normocephalic, atraumatic.   Neurological examination:  General: NAD, well-groomed, appears stated age. Orientation: The patient is alert. Oriented to person, place and not  to date Cranial nerves: There is good facial symmetry. Mild hypomimia. The speech is fluent and clear. No aphasia or dysarthria. Fund of knowledge is appropriate. Recent memory impaired and remote memory is normal.  Attention and concentration are normal.  Able to name objects and repeat phrases.  Hearing is intact to conversational tone .    Sensation: Sensation is intact to light touch throughout Motor: Strength is at least antigravity x4. DTR's 2/4 in UE/LE       No data to display             11/19/2022    3:32 PM 11/12/2022    9:00 AM 04/07/2022   11:00 AM  MMSE - Mini Mental State Exam  Orientation to time 5 5 5   Orientation to Place 5 5 5   Registration 3 3 3   Attention/ Calculation 5 5 5   Recall 0 0 2  Language- name 2 objects 2 2 2   Language- repeat 1 1 1   Language- follow 3 step command 3 3 3   Language- read & follow direction 1 1 1   Write a sentence 1 1 1   Copy design 0 1 0  Total score 26 27 28        Movement examination: Tone: There is normal tone in the UE/LE Abnormal movements:  minimal intention tremor Ll hand on pronation, known involuntary mouth dyskinesia.  No myoclonus.  No asterixis.   Coordination:  There is no  decremation with RAM's. Normal finger to nose  Gait and Station: The patient has some difficulty arising out of a deep-seated chair without the use of the hands. The patient's stride length is short  Gait is cautious and narrow.   Thank you for allowing [place] the opportunity to participate in the care of this nice patient. Please do not hesitate to contact us for any questions or concerns.   Total time spent on today's visit was 30 minutes dedicated to this patient today, preparing to see patient, examining the patient, ordering tests and/or medications and counseling the patient, documenting clinical information in the EHR or other health record, independently interpreting results and communicating results to the patient/family, discussing treatment and goals, answering patient's questions and coordinating care.  Cc:  [name], FNP  Marlowe Kays 05/14/2023 9:22 AM

## 2023-05-14 NOTE — Patient Instructions (Addendum)
Good to see you!  Continue Donepezil 10mg  daily 2  Agree with  PT 3. Continue your mood meds by Primary and Psych  4. Repeat the Neuropsych testing for clarity of diagnosis  8/20 Repeat the MRI brain prior to the August  evaluation 3. Follow-up in 6 months, call for any changes  FALL PRECAUTIONS: Be cautious when walking. Scan the area for obstacles that may increase the risk of trips and falls. When getting up in the mornings, sit up at the edge of the bed for a few minutes before getting out of bed. Consider elevating the bed at the head end to avoid drop of blood pressure when getting up. Walk always in a well-lit room (use night lights in the walls). Avoid area rugs or power cords from appliances in the middle of the walkways. Use a walker or a cane if necessary and consider physical therapy for balance exercise. Get your eyesight checked regularly.   HOME SAFETY: Consider the safety of the kitchen when operating appliances like stoves, microwave oven, and blender. Consider having supervision and share cooking responsibilities until no longer able to participate in those. Accidents with firearms and other hazards in the house should be identified and addressed as well.   ABILITY TO BE LEFT ALONE: If patient is unable to contact 911 operator, consider using LifeLine, or when the need is there, arrange for someone to stay with patients. Smoking is a fire hazard, consider supervision or cessation. Risk of wandering should be assessed by caregiver and if detected at any point, supervision and safe proof recommendations should be instituted.   RECOMMENDATIONS FOR ALL PATIENTS WITH MEMORY PROBLEMS: 1. Continue to exercise (Recommend 30 minutes of walking everyday, or 3 hours every week) 2. Increase social interactions - continue going to [place] and enjoy social gatherings with friends and family 3. Eat healthy, avoid fried foods and eat more fruits and vegetables 4. Maintain adequate blood  pressure, blood sugar, and blood cholesterol level. Reducing the risk of stroke and cardiovascular disease also helps promoting better memory. 5. Avoid stressful situations. Live a simple life and avoid aggravations. Organize your time and prepare for the next day in anticipation. 6. Sleep well, avoid any interruptions of sleep and avoid any distractions in the bedroom that may interfere with adequate sleep quality 7. Avoid sugar, avoid sweets as there is a strong link between excessive sugar intake, diabetes, and cognitive impairment The Mediterranean diet has been shown to help patients reduce the risk of progressive memory disorders and reduces cardiovascular risk. This includes eating fish, eat fruits and green leafy vegetables, nuts like almonds and hazelnuts, walnuts, and also use olive oil. Avoid fast foods and fried foods as much as possible. Avoid sweets and sugar as sugar use has been linked to worsening of memory function.  There is always a concern of gradual progression of memory problems. If this is the case, then we may need to adjust level of care according to patient needs. Support, both to the patient and caregiver, should then be put into place.   Mri at [facility] [phone]

## 2023-05-17 ENCOUNTER — Encounter: Payer: Self-pay | Admitting: Physician Assistant

## 2023-05-17 ENCOUNTER — Ambulatory Visit: Payer: Medicare Other

## 2023-05-17 DIAGNOSIS — R2681 Unsteadiness on feet: Secondary | ICD-10-CM

## 2023-05-17 NOTE — Telephone Encounter (Signed)
Indeed, let's order PT for strength and balance, thank you

## 2023-05-20 ENCOUNTER — Encounter: Payer: Self-pay | Admitting: Gastroenterology

## 2023-05-27 ENCOUNTER — Other Ambulatory Visit: Payer: Medicare Other

## 2023-05-27 ENCOUNTER — Ambulatory Visit (INDEPENDENT_AMBULATORY_CARE_PROVIDER_SITE_OTHER): Payer: Medicare Other | Admitting: Nurse Practitioner

## 2023-05-27 VITALS — BP 98/62 | HR 70 | Temp 98.6°F | Ht 62.5 in | Wt 150.0 lb

## 2023-05-27 DIAGNOSIS — R300 Dysuria: Secondary | ICD-10-CM | POA: Diagnosis not present

## 2023-05-27 DIAGNOSIS — I10 Essential (primary) hypertension: Secondary | ICD-10-CM | POA: Diagnosis not present

## 2023-05-27 DIAGNOSIS — K219 Gastro-esophageal reflux disease without esophagitis: Secondary | ICD-10-CM | POA: Diagnosis not present

## 2023-05-27 DIAGNOSIS — E119 Type 2 diabetes mellitus without complications: Secondary | ICD-10-CM | POA: Diagnosis not present

## 2023-05-27 DIAGNOSIS — H109 Unspecified conjunctivitis: Secondary | ICD-10-CM

## 2023-05-27 HISTORY — DX: Dysuria: R30.0

## 2023-05-27 HISTORY — DX: Unspecified conjunctivitis: H10.9

## 2023-05-27 LAB — COMPREHENSIVE METABOLIC PANEL
ALT: 32 U/L (ref 0–35)
AST: 36 U/L (ref 0–37)
Albumin: 3.7 g/dL (ref 3.5–5.2)
Alkaline Phosphatase: 101 U/L (ref 39–117)
BUN: 12 mg/dL (ref 6–23)
CO2: 28 mEq/L (ref 19–32)
Calcium: 10.2 mg/dL (ref 8.4–10.5)
Chloride: 107 mEq/L (ref 96–112)
Creatinine, Ser: 0.87 mg/dL (ref 0.40–1.20)
GFR: 64.26 mL/min (ref >60.00)
Glucose, Bld: 129 mg/dL — ABNORMAL HIGH (ref 70–99)
Potassium: 4.1 mEq/L (ref 3.5–5.1)
Sodium: 140 mEq/L (ref 135–145)
Total Bilirubin: 0.5 mg/dL (ref 0.2–1.2)
Total Protein: 7 g/dL (ref 6.0–8.3)

## 2023-05-27 LAB — CBC
HCT: 34 % — ABNORMAL LOW (ref 36.0–46.0)
Hemoglobin: 10.8 g/dL — ABNORMAL LOW (ref 12.0–15.0)
MCHC: 31.7 g/dL (ref 30.0–36.0)
MCV: 78 fl (ref 78.0–100.0)
Platelets: 300 10*3/uL (ref 150.0–400.0)
RBC: 4.37 Mil/uL (ref 3.87–5.11)
RDW: 19.1 % — ABNORMAL HIGH (ref 11.5–15.5)
WBC: 7.9 10*3/uL (ref 4.0–10.5)

## 2023-05-27 LAB — HEMOGLOBIN A1C: Hgb A1c MFr Bld: 6.9 % — ABNORMAL HIGH (ref 4.6–6.5)

## 2023-05-27 LAB — MICROALBUMIN / CREATININE URINE RATIO
Creatinine,U: 129.2 mg/dL
Microalb Creat Ratio: 0.8 mg/g (ref 0.0–30.0)
Microalb, Ur: 1 mg/dL (ref 0.0–1.9)

## 2023-05-27 LAB — URINALYSIS WITH CULTURE, IF INDICATED
Bilirubin Urine: NEGATIVE
Ketones, ur: NEGATIVE
Leukocytes,Ua: NEGATIVE
Nitrite: NEGATIVE
Specific Gravity, Urine: 1.02 (ref 1.000–1.030)
Total Protein, Urine: NEGATIVE
Urine Glucose: NEGATIVE
Urobilinogen, UA: 2 — AB (ref 0.0–1.0)
pH: 7 (ref 5.0–8.0)

## 2023-05-27 LAB — TSH: TSH: 1.44 u[IU]/mL (ref 0.35–5.50)

## 2023-05-27 MED ORDER — POLYMYXIN B-TRIMETHOPRIM 10000-0.1 UNIT/ML-% OP SOLN
1.0000 [drp] | Freq: Four times a day (QID) | OPHTHALMIC | 0 refills | Status: AC
Start: 2023-05-27 — End: 2023-06-03

## 2023-05-27 NOTE — Assessment & Plan Note (Signed)
Chronic, blood pressure on the soft side today. Patient to follow-up in a month or so for close monitoring, family members were encouraged to monitor for any dizziness or lightheadedness.  Will also order labs for further evaluation.  Further recommendations may be made based on results.  In the interim patient to continue on her losartan 50 mg daily.

## 2023-05-27 NOTE — Progress Notes (Signed)
New Patient Office Visit  Subjective    Patient ID: Brandi Walsh, female    DOB: May 21, 1946  Age: 77 y.o. MRN: [id number]  CC:  Chief Complaint  Patient presents with   New Patient (Initial Visit)    Establish care and also about over all health and  Over product eye mucus     HPI Brandi Walsh presents to establish care She is accompanied today by her granddaughter.  She is transitioning care to this office as it is closer to her home. Her and her granddaughter's main concerns today include deconditioning and bilateral eye drainage. Per daughter patient seems to be weak, has been referred to physical therapy by her neurologist.  She sees neurology for management of Alzheimer's.  However per chart review it appears diagnosis of Alzheimer's is not clear and further evaluation is being conducted. Patient was recently hospitalized earlier this month for GI bleed thought to be secondary to NSAID use.  Patient is no longer using NSAIDs and is currently on Protonix twice a day.  Patient and granddaughter deny any noticeable evidence of bleeding such as coffee-ground emesis, bloody emesis, black stools, bloody stools. She is experiencing discharge in bilateral eyes which seem to be worse in the morning.  Denies visual changes or eye pain. Granddaughter is also concerned that patient may have a UTI.  Outpatient Encounter Medications as of 05/27/2023  Medication Sig   alendronate (FOSAMAX) 70 MG tablet Take 70 mg by mouth once a week.   Cholecalciferol (VITAMIN D) 50 MCG (2000 UT) tablet Take 2,000 Units by mouth daily.   donepezil (ARICEPT) 10 MG tablet Take 1 tablet (10 mg total) by mouth daily before breakfast.   escitalopram (LEXAPRO) 10 MG tablet TAKE 1 & 1/2 (ONE & ONE-HALF) TABLETS BY MOUTH ONCE DAILY (Patient taking differently: Take 15 mg by mouth daily.)   losartan (COZAAR) 50 MG tablet Take 1 tablet (50 mg total) by mouth daily.   mupirocin cream (BACTROBAN) 2 % Apply 1  Application topically 2 (two) times daily.   pantoprazole (PROTONIX) 40 MG tablet Take 1 tablet (40 mg total) by mouth 2 (two) times daily.   rosuvastatin (CRESTOR) 40 MG tablet Take 1 tablet (40 mg total) by mouth daily.   trimethoprim-polymyxin b (POLYTRIM) ophthalmic solution Place 1 drop into both eyes every 6 (six) hours for 7 days.   No facility-administered encounter medications on file as of 05/27/2023.    Past Medical History:  Diagnosis Date   Alzheimer's disease (HCC)    COUGH DUE TO ACE INHIBITORS 11/25/2009   Qualifier: Diagnosis of  By: Everardo All MD, Sean A    Depression    DIABETES MELLITUS, TYPE II 10/13/2007   Qualifier: Diagnosis of  By: Charlsie Quest RMA, Lucy     HYPERLIPIDEMIA 10/13/2007   Qualifier: Diagnosis of  By: Tyrone Apple, Lucy     HYPERTENSION 10/13/2007   Qualifier: Diagnosis of  By: Tyrone Apple, Lucy     MENOPAUSAL SYNDROME 10/13/2007   Qualifier: Diagnosis of  By: Charlsie Quest RMA, Lucy     OSTEOPOROSIS 10/13/2007   Qualifier: Diagnosis of  By: Samara Snide      Past Surgical History:  Procedure Laterality Date   ABDOMINAL HYSTERECTOMY     BIOPSY  05/08/2023   Procedure: BIOPSY;  Surgeon: Lynann Bologna, MD;  Location: [facility];  Service: Gastroenterology;;   BREAST BIOPSY  08/2002   ESOPHAGOGASTRODUODENOSCOPY N/A 05/08/2023   Procedure: ESOPHAGOGASTRODUODENOSCOPY (EGD);  Surgeon: Lynann Bologna, MD;  Location: [facility];  Service: Gastroenterology;  Laterality: N/A;   TONSILLECTOMY      Family History  Problem Relation Age of Onset   Anxiety disorder Mother    Hyperparathyroidism Sister    Cancer Neg Hx    Breast cancer Neg Hx     Social History   Socioeconomic History   Marital status: Married    Spouse name: Not on file   Number of children: 3   Years of education: 14   Highest education level: Not on file  Occupational History   Occupation: Retired   Tobacco Use   Smoking status: Never   Smokeless tobacco: Never  Vaping Use   Vaping Use:  Never used  Substance and Sexual Activity   Alcohol use: No   Drug use: No   Sexual activity: Yes  Other Topics Concern   Not on file  Social History Narrative   Left Handed   Two Story Home   Lives with Husband    Drinks coffee and [employer] sometimes   retired   International aid/development worker of [employer] Strain: Not on file  Food Insecurity: No Food Insecurity (05/06/2023)   Hunger Vital Sign    Worried About Running Out of Food in the Last Year: Never true    Ran Out of Food in the Last Year: Never true  Transportation Needs: No Transportation Needs (05/06/2023)   PRAPARE - Administrator, Civil Service (Medical): No    Lack of Transportation (Non-Medical): No  Physical Activity: Not on file  Stress: Not on file  Social Connections: Not on file  Intimate Partner Violence: Patient Unable To Answer (05/06/2023)   Humiliation, Afraid, Rape, and Kick questionnaire    Fear of Current or Ex-Partner: Patient unable to answer    Emotionally Abused: Patient unable to answer    Physically Abused: Patient unable to answer    Sexually Abused: Patient unable to answer    Review of Systems  Eyes:  Positive for discharge. Negative for blurred vision and pain.  Cardiovascular:  Negative for chest pain.  Gastrointestinal:  Negative for blood in stool and vomiting.  Neurological:  Negative for dizziness and loss of consciousness.        Objective    BP 98/62   Pulse 70   Temp 98.6 F (37 C) (Temporal)   Ht 5' 2.5" (1.588 m)   Wt 150 lb (68 kg)   SpO2 95%   BMI 27.00 kg/m   Physical Exam Vitals reviewed.  Constitutional:      General: She is not in acute distress.    Appearance: Normal appearance.  HENT:     Head: Normocephalic and atraumatic.  Neck:     Vascular: No carotid bruit.  Cardiovascular:     Rate and Rhythm: Normal rate and regular rhythm.     Pulses: Normal pulses.     Heart sounds: Normal heart sounds.  Pulmonary:     Effort:  Pulmonary effort is normal.     Breath sounds: Normal breath sounds.  Skin:    General: Skin is warm and dry.  Neurological:     General: No focal deficit present.     Mental Status: She is alert and oriented to person, place, and time.  Psychiatric:        Mood and Affect: Mood normal.        Behavior: Behavior normal.        Judgment: Judgment normal.  Assessment & Plan:   Problem List Items Addressed This Visit       Cardiovascular and Mediastinum   Essential hypertension    Chronic, blood pressure on the soft side today. Patient to follow-up in a month or so for close monitoring, family members were encouraged to monitor for any dizziness or lightheadedness.  Will also order labs for further evaluation.  Further recommendations may be made based on results.  In the interim patient to continue on her losartan 50 mg daily.      Relevant Orders   CBC (Completed)   Hemoglobin A1c (Completed)   Comprehensive metabolic panel (Completed)   TSH (Completed)     Digestive   GERD (gastroesophageal reflux disease)    Chronic Continue to follow with GI and to continue on pantoprazole twice a day.      Relevant Orders   CBC (Completed)   Hemoglobin A1c (Completed)   Comprehensive metabolic panel (Completed)   TSH (Completed)     Endocrine   Diabetes (HCC)    Labs ordered, further recommendations may be made based upon his results       Relevant Orders   CBC (Completed)   Hemoglobin A1c (Completed)   Comprehensive metabolic panel (Completed)   TSH (Completed)   Microalbumin / creatinine urine ratio (Completed)     Other   Conjunctivitis of both eyes - Primary    Bilateral crusty drainage noted today.  Will treat with course of polymyxin trimethoprim eyedrops as well as recommended patient clean eyes twice a day with mild soap such as Laural [employer] baby soap or Dove.  Follow-up in 4-6 weeks for close monitoring.      Relevant Medications    trimethoprim-polymyxin b (POLYTRIM) ophthalmic solution   Other Relevant Orders   CBC (Completed)   Hemoglobin A1c (Completed)   Comprehensive metabolic panel (Completed)   TSH (Completed)   Dysuria    Labs ordered, further recommendations may be made based upon his results       Relevant Orders   Urinalysis with Culture, if indicated (Completed)    Return in about 1 month (around 06/26/2023) for F/U with Macon Lesesne.   Elenore Paddy, NP

## 2023-05-27 NOTE — Assessment & Plan Note (Signed)
Labs ordered, further recommendations may be made based upon his results. 

## 2023-05-27 NOTE — Assessment & Plan Note (Signed)
Bilateral crusty drainage noted today.  Will treat with course of polymyxin trimethoprim eyedrops as well as recommended patient clean eyes twice a day with mild soap such as La[employer]'s baby soap or Dove.  Follow-up in 4-6 weeks for close monitoring.

## 2023-05-27 NOTE — Assessment & Plan Note (Signed)
Chronic Continue to follow with GI and to continue on pantoprazole twice a day.

## 2023-05-28 LAB — URINE CULTURE
MICRO NUMBER:: 15135558
SPECIMEN QUALITY:: ADEQUATE

## 2023-05-29 ENCOUNTER — Ambulatory Visit
Admission: RE | Admit: 2023-05-29 | Discharge: 2023-05-29 | Disposition: A | Discharge status: Home / Self Care | Payer: Medicare Other | Source: Ambulatory Visit | Attending: Physician Assistant | Admitting: Physician Assistant

## 2023-05-31 NOTE — Progress Notes (Signed)
MRI brain with chronic changes in the circulation, some areas a little more than before, but overall  no new findings, Mild atrophy of the brain, thanks

## 2023-06-14 NOTE — Therapy (Signed)
OUTPATIENT PHYSICAL THERAPY EVALUATION   Patient Name: Brandi Walsh MRN: [id number] DOB:Nov 06, 1946, 77 y.o., female Today's Date: 06/16/2023  END OF SESSION:  PT End of Session - 06/16/23 0844     Visit Number 1    Number of Visits 17    Date for PT Re-Evaluation 08/11/23    Authorization Type [employer] medicare    Progress Note Due on Visit 10    PT Start Time 0846    PT Stop Time 0926    PT Time Calculation (min) 40 min    Activity Tolerance Patient tolerated treatment well    Behavior During Therapy [facility] for tasks assessed/performed             Past Medical History:  Diagnosis Date   Alzheimer's disease (HCC)    COUGH DUE TO ACE INHIBITORS 11/25/2009   Qualifier: Diagnosis of  By: Everardo All MD, Sean A    Depression    DIABETES MELLITUS, TYPE II 10/13/2007   Qualifier: Diagnosis of  By: Samara Snide     HYPERLIPIDEMIA 10/13/2007   Qualifier: Diagnosis of  By: Samara Snide     HYPERTENSION 10/13/2007   Qualifier: Diagnosis of  By: Samara Snide     MENOPAUSAL SYNDROME 10/13/2007   Qualifier: Diagnosis of  By: Charlsie Quest RMA, Lucy     OSTEOPOROSIS 10/13/2007   Qualifier: Diagnosis of  By: Samara Snide     Past Surgical History:  Procedure Laterality Date   ABDOMINAL HYSTERECTOMY     BIOPSY  05/08/2023   Procedure: BIOPSY;  Surgeon: Lynann Bologna, MD;  Location: [facility];  Service: Gastroenterology;;   BREAST BIOPSY  08/2002   ESOPHAGOGASTRODUODENOSCOPY N/A 05/08/2023   Procedure: ESOPHAGOGASTRODUODENOSCOPY (EGD);  Surgeon: Lynann Bologna, MD;  Location: [facility];  Service: Gastroenterology;  Laterality: N/A;   TONSILLECTOMY     Patient Active Problem List   Diagnosis Date Noted   Conjunctivitis of both eyes 05/27/2023   Dysuria 05/27/2023   Melena 05/06/2023   Mild cognitive impairment with memory loss 04/07/2022   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   Abnormal liver enzymes 09/19/2019   Blurred vision 09/19/2019   Hypercalcemia 03/15/2019   Major depression in  full remission (HCC) 11/24/2018   Memory loss 12/24/2016   Encounter for well adult exam with abnormal findings 06/13/2013   Hyperparathyroidism, primary (HCC) 06/12/2013   GERD (gastroesophageal reflux disease) 02/19/2012   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   COUGH DUE TO ACE INHIBITORS 11/25/2009   Diabetes (HCC) 10/13/2007   Dyslipidemia 10/13/2007   Essential hypertension 10/13/2007   MENOPAUSAL SYNDROME 10/13/2007   Osteoporosis 10/13/2007    PCP: Elenore Paddy, NP  REFERRING PROVIDER: [name], PA-C  REFERRING DIAG: [phone] (ICD-10-CM) - Gait instability  Rationale for Evaluation and Treatment: Rehabilitation  THERAPY DIAG:  Muscle weakness (generalized)  Other abnormalities of gait and mobility  ONSET DATE: at least 5 years, more notable over past few months  SUBJECTIVE:  SUBJECTIVE STATEMENT: Pt accompanied by her granddaughter, [name], with her consent. Much of history gained from pt, family assisting.  Has had ongoing issues with mobility/weakness over past few years (at least 5 years). Last few months seems to have episodes of weakness that she saw neurologist about. With these episodes pt describes mild dizziness but mostly fatigue, tends to occur when she has been up for a longer period of time. Pt reports history of vertigo/dizziness and feels this is consistent with her chronic symptoms in quality. Since then has started deferring tub transfers and doing sponge baths instead. Also notes a bit of weakness after hospitalization for GI bleed. Does not ambulate with a device but does receive assist from family for ambulation at times, as well as assistance for ADLs/housework.    PERTINENT HISTORY:  HTN, GERD, DM, osteoporosis, parkinsonism (per neurology notes)  PAIN:  Denies any  significant pain affecting mobility  PRECAUTIONS: fall risk, osteoporosis   WEIGHT BEARING RESTRICTIONS: No  FALLS:  Has patient fallen in last 6 months? No  LIVING ENVIRONMENT: 2 story home with husband, her granddaughter lives with her on main floor, has help entering a couple steps. Granddaughter helps with housework, she also works full time - has 77 year old and 77 year old No AD but does get assistance with ambulation from family Has grab bars in bathroom, shower chair. Has tub shower, does sponge baths  OCCUPATION: retired - used to work with [employer]. Enjoys reading, watching TV (wheel of fortune, jeopardy)  PLOF: has help from family for dressing and bathing; has needed assistance for ~5 years  PATIENT GOALS: be more independent, get out into community more   NEXT MD VISIT: July 23 2023  OBJECTIVE:   DIAGNOSTIC FINDINGS:  05/31/23 Brain MRI "IMPRESSION: 1. No evidence of an acute intracranial abnormality. 2. Mild chronic small vessel ischemic changes within the cerebral white matter, similar to the prior brain MRI of 07/22/2020. 3. There are a few chronic microhemorrhages within the supratentorial brain, as described and increased in number from the prior MRI. 4. Mild generalized cerebral atrophy."  PATIENT SURVEYS:  FOTO deferred given time constraints and history of dementia  COGNITION: Overall cognitive status: conversationally appropriate for today's visit, some increased processing time noted. Hx of cognitive impairments/dementia per chart review     SENSATION/NEURO: Light touch intact all extremities, no extinction   Finger<>nose testing bradykinetic and mild tremors L greater than R No overt ataxia with gait, shuffling pattern, bradykinetic movements and reduced initiation with motor tasks   POSTURE: tendency to hold R UE in elbow flexion, kyphotic and forward head posture   RANGE OF MOTION:     Active  Right eval Left eval  Shoulder flexion ~90 deg  ~90 deg  Functional ER combo occiput occiput  Functional IR combo    Knee extension    Ankle dorsiflexion At least neutral Limited on L compared to R   (Blank rows = not tested) (Key: WFL = within functional limits not formally assessed, * = concordant pain, s = stiffness/stretching sensation, NT = not tested)  Comments: limited by stiffness   STRENGTH TESTING:  MMT Right eval Left eval  Shoulder flexion    Shoulder abduction    Elbow flexion    Elbow extension    Grip strength (gross)    Hip flexion 4 4  Hip abduction (modified sitting) 3+ 3+  Knee flexion 4 4  Knee extension 4- 4-  Ankle dorsiflexion 4+ 4+  Ankle plantarflexion     (  Blank rows = not tested) (Key: WFL = within functional limits not formally assessed, * = concordant pain, s = stiffness/stretching sensation, NT = not tested)  Comments:     FUNCTIONAL TESTS:  5xSTS: 18 sec with heavy UE support, unable to achieve full upright   Dynamic Gait Index:   1. Gait Level Surface;  1  2. Change in Gait Speed; 2  3. Gait With Horizontal Head Turns; 2  4. Gait With Vertical Head Turns; 2  5. Gait and Pivot Turn; 1  6. Step Over Obstacle; 1  7. Step Around Obstacles; 1  8. Steps;  1 (per report)   Grading scale: 3 No impairment; 2 Mild impairment/AD use; 1 Moderate impairment; 0 Severe impairment or unable to perform   Total score: 11/24  Cutoff score for fall risk: </=19/24   GAIT: Distance walked: within clinic Assistive device utilized: None Level of assistance: Complete Independence Comments: shuffling pattern, reduced initiation, forward flexed posture, reduced trunk rotation and arm swing (more affected on R)  TODAY'S TREATMENT:                                                                                                                              [facility] Adult PT Treatment:                                                DATE: 06/16/23 Deferred in favor of increased time with education, discussion, and  balance assessment  PATIENT EDUCATION:  Education details: Pt education on PT impairments, prognosis, and POC. Informed consent. Rationale for interventions Person educated: Patient and caregiver Education method: Explanation, Demonstration, Tactile cues, Verbal cues Education comprehension: verbalized understanding, returned demonstration, verbal cues required, tactile cues required, and needs further education    HOME EXERCISE PROGRAM: deferred  ASSESSMENT:  CLINICAL IMPRESSION: Patient is a pleasant 77 y.o. woman who was seen today for physical therapy evaluation and treatment for gait instability ongoing for last few years. Accompanied by her granddaughter with her consent. On exam pt demonstrates altered gait kinematics and transfer mechanics, mild generalized weakness, impaired coordination, and stiffness throughout BUE/trunk. Pt has known history of parkinsonism per review of neurology notes (most recent MRI 05/31/23, [facility] to [facility]), and in clinic today she does demonstrate shuffling gait pattern, generalized bradykinesia, reduced motor initiation, and incoordination. Given this, discussion w/ pt and family about possibility of transferring to our neuro clinic - they state they would prefer to continue treatment at our clinic for now, which is respected. Deferring HEP today given increased time with balance assessment pt discussion/education. No adverse events, tolerates session well overall. Close SBA-CGA provided for safety with mobility within clinic today. Recommend skilled PT to address aforementioned deficits and improve functional tolerance, reduce fall risk. Pt departs today's session in no acute distress, all voiced  questions/concerns addressed appropriately from PT perspective.     OBJECTIVE IMPAIRMENTS: Abnormal gait, decreased activity tolerance, decreased balance, decreased coordination, decreased endurance, decreased mobility, difficulty walking, decreased ROM, decreased strength,  impaired flexibility, improper body mechanics, postural dysfunction, and pain.   ACTIVITY LIMITATIONS: carrying, lifting, bending, standing, squatting, stairs, transfers, bathing, dressing, reach over head, hygiene/grooming, and locomotion level  PARTICIPATION LIMITATIONS: meal prep, cleaning, laundry, driving, shopping, and community activity  PERSONAL FACTORS: Age, Time since onset of injury/illness/exacerbation, and 3+ comorbidities: HTN, DM, osteoporosis, depression, parkinsonism  are also affecting patient's functional outcome.   REHAB POTENTIAL: Fair given chronicity  CLINICAL DECISION MAKING: Evolving/moderate complexity  EVALUATION COMPLEXITY: Moderate   GOALS: Goals reviewed with patient? No although did review role of PT, POC  SHORT TERM GOALS: Target date: 07/14/2023 Pt will demonstrate appropriate understanding and performance of initially prescribed HEP in order to facilitate improved independence with management of symptoms.  Baseline: HEP TBD Goal status: INITIAL   2. Pt will be able to perform five consecutive sit to stands with upper extremity support and ability to achieve full upright in order to improve transfer mechanics.  Baseline: heavy UE support, unable to achieve full upright  Goal status: INITIAL  LONG TERM GOALS: Target date: 08/11/2023 1.  Pt will demonstrate global improvement of at least 1 pt in LE MMT in order to facilitate improved functional strength.  Baseline: see MMT chart above  Goal status: INITIAL   2.  Pt will perform Dynamic Gait Index with score greater than or equal to 16/24 in order to indicate reduced fall risk (<19/24 predictive of falls in elderly per [facility] et al 1995, MCID 1.9 per Pardasaney et al 2012)  Baseline: 11/24  Goal status: INITIAL    3. Pt will perform 5xSTS in <15 sec with UE support and achieving full upright in order to demonstrate reduced fall risk and improved functional independence. (MCID of  2.3sec)  Baseline: 18sec with altered kinematics, unable to achieve full upright, heavy UE support  Goal status: INITIAL   4. Pt will demonstrate appropriate performance of final prescribed HEP in order to facilitate improved self-management of symptoms post-discharge.   Baseline: HEP TBD  Goal status: INITIAL    PLAN:  PT FREQUENCY: 1-2x/week  PT DURATION: 8 weeks  PLANNED INTERVENTIONS: Therapeutic exercises, Therapeutic activity, Neuromuscular re-education, Balance training, Gait training, Patient/Family education, Self Care, Joint mobilization, Stair training, Vestibular training, Cryotherapy, Moist heat, Taping, Manual therapy, and Re-evaluation.  PLAN FOR NEXT SESSION: establish HEP. Work on balance. Would likely benefit from change of pace activities, truncal dissociation, transfer mechanics.    Ashley Murrain PT, DPT 06/16/2023 1:00 PM

## 2023-06-16 ENCOUNTER — Encounter: Payer: Self-pay | Admitting: Physical Therapy

## 2023-06-16 ENCOUNTER — Other Ambulatory Visit: Payer: Self-pay

## 2023-06-16 ENCOUNTER — Ambulatory Visit: Payer: Medicare Other | Attending: Physician Assistant | Admitting: Physical Therapy

## 2023-06-16 DIAGNOSIS — M6281 Muscle weakness (generalized): Secondary | ICD-10-CM | POA: Diagnosis present

## 2023-06-16 DIAGNOSIS — R2689 Other abnormalities of gait and mobility: Secondary | ICD-10-CM | POA: Insufficient documentation

## 2023-06-16 DIAGNOSIS — R2681 Unsteadiness on feet: Secondary | ICD-10-CM | POA: Diagnosis not present

## 2023-06-18 ENCOUNTER — Ambulatory Visit (INDEPENDENT_AMBULATORY_CARE_PROVIDER_SITE_OTHER): Payer: Medicare Other | Admitting: Gastroenterology

## 2023-06-18 ENCOUNTER — Encounter: Payer: Self-pay | Admitting: Gastroenterology

## 2023-06-18 ENCOUNTER — Other Ambulatory Visit (INDEPENDENT_AMBULATORY_CARE_PROVIDER_SITE_OTHER): Payer: Medicare Other

## 2023-06-18 VITALS — BP 110/70 | HR 77 | Ht 60.0 in | Wt 153.0 lb

## 2023-06-18 DIAGNOSIS — R5383 Other fatigue: Secondary | ICD-10-CM | POA: Diagnosis not present

## 2023-06-18 DIAGNOSIS — Z8711 Personal history of peptic ulcer disease: Secondary | ICD-10-CM

## 2023-06-18 DIAGNOSIS — D509 Iron deficiency anemia, unspecified: Secondary | ICD-10-CM

## 2023-06-18 LAB — CBC WITH DIFFERENTIAL/PLATELET
Basophils Absolute: 0.1 10*3/uL (ref 0.0–0.1)
Basophils Relative: 0.8 % (ref 0.0–3.0)
Eosinophils Absolute: 0.2 10*3/uL (ref 0.0–0.7)
Eosinophils Relative: 2.2 % (ref 0.0–5.0)
HCT: 33.1 % — ABNORMAL LOW (ref 36.0–46.0)
Hemoglobin: 10.4 g/dL — ABNORMAL LOW (ref 12.0–15.0)
Lymphocytes Relative: 27.5 % (ref 12.0–46.0)
Lymphs Abs: 2.5 10*3/uL (ref 0.7–4.0)
MCHC: 31.3 g/dL (ref 30.0–36.0)
MCV: 77.2 fl — ABNORMAL LOW (ref 78.0–100.0)
Monocytes Absolute: 0.8 10*3/uL (ref 0.1–1.0)
Monocytes Relative: 8.9 % (ref 3.0–12.0)
Neutro Abs: 5.6 10*3/uL (ref 1.4–7.7)
Neutrophils Relative %: 60.6 % (ref 43.0–77.0)
Platelets: 245 10*3/uL (ref 150.0–400.0)
RBC: 4.29 Mil/uL (ref 3.87–5.11)
RDW: 18.1 % — ABNORMAL HIGH (ref 11.5–15.5)
WBC: 9.2 10*3/uL (ref 4.0–10.5)

## 2023-06-18 LAB — COMPREHENSIVE METABOLIC PANEL
ALT: 24 U/L (ref 0–35)
AST: 30 U/L (ref 0–37)
Albumin: 3.7 g/dL (ref 3.5–5.2)
Alkaline Phosphatase: 88 U/L (ref 39–117)
BUN: 13 mg/dL (ref 6–23)
CO2: 26 mEq/L (ref 19–32)
Calcium: 10.2 mg/dL (ref 8.4–10.5)
Chloride: 106 mEq/L (ref 96–112)
Creatinine, Ser: 0.87 mg/dL (ref 0.40–1.20)
GFR: 64.24 mL/min (ref >60.00)
Glucose, Bld: 168 mg/dL — ABNORMAL HIGH (ref 70–99)
Potassium: 4.1 mEq/L (ref 3.5–5.1)
Sodium: 138 mEq/L (ref 135–145)
Total Bilirubin: 0.5 mg/dL (ref 0.2–1.2)
Total Protein: 7 g/dL (ref 6.0–8.3)

## 2023-06-18 LAB — VITAMIN B12: Vitamin B-12: 559 pg/mL (ref 211–911)

## 2023-06-18 LAB — FOLATE: Folate: 23.4 ng/mL (ref >5.9)

## 2023-06-18 NOTE — Progress Notes (Signed)
Chief Complaint: Hospital Follow up Primary GI MD: Dr. Barron Alvine  HPI:  77 year old female PMH listed below presents for hospital follow up.  Recent hospital admission for UGIB 6/6-05/08/23. Underwent EGD which showed 2 nonbleeding gastric ulcers and gastric polyp. Recent labwork 05/27/23 showed hgb 10.8. BUN 12, Cr. 0.87. repeat EGD recommended in 12 weeks.  Patient last seen 12/2022 by Dr. Barron Alvine. Reported resolution of dysphagia s/p EGD with empiric dilation. And normalization of liver enzymes (after mildly elevated T bili consistent with Gilbert's).  Patient's granddaughter provides history and states she has had no further melena since hospital and she has been doing well overall. Recent MRI of brain shows few micro hemorrhages and chronic ischemia. Patient is currently on Aricept for alzheimer dementia.   PREVIOUS GI WORKUP   EGD 05/07/23: - 2 cm hiatal hernia. - Non- bleeding gastric ulcers with no stigmata of bleeding. Biopsied. - A single gastric polyp. Biopsied. - Normal examined duodenum.  -EGD (06/11/2020): Mild Schatzki's ring dilated with 20 mm TTS balloon without mucosal rent.  Empirically dilated remainder of the esophagus with 20 mm TTS balloon inflated and dragged through esophagus without mucosal change.  20 mm hyperplastic polyp in gastric antrum (biopsies benign).  Benign fundic gland polyps. -Resolution of dysphagia since EGD with empiric dilation.  -Patient has since regained her appetite and back to baseline weight  Colonoscopy in 2007  Past Medical History:  Diagnosis Date   Alzheimer's disease (HCC)    COUGH DUE TO ACE INHIBITORS 11/25/2009   Qualifier: Diagnosis of  By: Everardo All MD, Sean A    Depression    DIABETES MELLITUS, TYPE II 10/13/2007   Qualifier: Diagnosis of  By: Tyrone Apple, Lucy     HYPERLIPIDEMIA 10/13/2007   Qualifier: Diagnosis of  By: Samara Snide     HYPERTENSION 10/13/2007   Qualifier: Diagnosis of  By: Samara Snide     MENOPAUSAL  SYNDROME 10/13/2007   Qualifier: Diagnosis of  By: Charlsie Quest RMA, Lucy     OSTEOPOROSIS 10/13/2007   Qualifier: Diagnosis of  By: Samara Snide      Past Surgical History:  Procedure Laterality Date   ABDOMINAL HYSTERECTOMY     BIOPSY  05/08/2023   Procedure: BIOPSY;  Surgeon: Lynann Bologna, MD;  Location: [facility];  Service: Gastroenterology;;   BREAST BIOPSY  08/2002   ESOPHAGOGASTRODUODENOSCOPY N/A 05/08/2023   Procedure: ESOPHAGOGASTRODUODENOSCOPY (EGD);  Surgeon: Lynann Bologna, MD;  Location: [facility];  Service: Gastroenterology;  Laterality: N/A;   TONSILLECTOMY      Current Outpatient Medications  Medication Sig Dispense Refill   alendronate (FOSAMAX) 70 MG tablet Take 70 mg by mouth once a week.     Cholecalciferol (VITAMIN D) 50 MCG (2000 UT) tablet Take 2,000 Units by mouth daily.     donepezil (ARICEPT) 10 MG tablet Take 1 tablet (10 mg total) by mouth daily before breakfast. 90 tablet 3   escitalopram (LEXAPRO) 10 MG tablet TAKE 1 & 1/2 (ONE & ONE-HALF) TABLETS BY MOUTH ONCE DAILY (Patient taking differently: Take 15 mg by mouth daily.) 135 tablet 3   losartan (COZAAR) 50 MG tablet Take 1 tablet (50 mg total) by mouth daily. 90 tablet 3   mupirocin cream (BACTROBAN) 2 % Apply 1 Application topically 2 (two) times daily. 15 g 0   pantoprazole (PROTONIX) 40 MG tablet Take 1 tablet (40 mg total) by mouth 2 (two) times daily. 120 tablet 0   rosuvastatin (CRESTOR) 40 MG tablet Take  1 tablet (40 mg total) by mouth daily. 90 tablet 3   No current facility-administered medications for this visit.    Allergies as of 06/18/2023 - Review Complete 06/16/2023  Allergen Reaction Noted   Lovastatin      Family History  Problem Relation Age of Onset   Anxiety disorder Mother    Hyperparathyroidism Sister    Cancer Neg Hx    Breast cancer Neg Hx     Social History   Socioeconomic History   Marital status: Married    Spouse name: Not on file   Number of children: 3    Years of education: 14   Highest education level: Not on file  Occupational History   Occupation: Retired   Tobacco Use   Smoking status: Never   Smokeless tobacco: Never  Vaping Use   Vaping status: Never Used  Substance and Sexual Activity   Alcohol use: No   Drug use: No   Sexual activity: Yes  Other Topics Concern   Not on file  Social History Narrative   Left Handed   Two Story Home   Lives with Husband    Drinks coffee and [employer] sometimes   retired   International aid/development worker of [employer] Strain: Not on file  Food Insecurity: No Food Insecurity (05/06/2023)   Hunger Vital Sign    Worried About Running Out of Food in the Last Year: Never true    Ran Out of Food in the Last Year: Never true  Transportation Needs: No Transportation Needs (05/06/2023)   PRAPARE - Administrator, Civil Service (Medical): No    Lack of Transportation (Non-Medical): No  Physical Activity: Not on file  Stress: Not on file  Social Connections: Not on file  Intimate Partner Violence: Patient Unable To Answer (05/06/2023)   Humiliation, Afraid, Rape, and Kick questionnaire    Fear of Current or Ex-Partner: Patient unable to answer    Emotionally Abused: Patient unable to answer    Physically Abused: Patient unable to answer    Sexually Abused: Patient unable to answer    Review of Systems:    Constitutional: No weight loss, fever, chills, weakness or fatigue HEENT: Eyes: No change in vision               Ears, Nose, Throat:  No change in hearing or congestion Skin: No rash or itching Cardiovascular: No chest pain, chest pressure or palpitations   Respiratory: No SOB or cough Gastrointestinal: See HPI and otherwise negative Genitourinary: No dysuria or change in urinary frequency Neurological: No headache, dizziness or syncope Musculoskeletal: No new muscle or joint pain Hematologic: No bleeding or bruising Psychiatric: No history of depression or anxiety     Physical Exam:  Vital signs: There were no vitals taken for this visit.  Constitutional: NAD, Well developed, Well nourished, alert and cooperative Head:  Normocephalic and atraumatic. Eyes:   PEERL, EOMI. No icterus. Conjunctiva pink. Respiratory: Respirations even and unlabored. Lungs clear to auscultation bilaterally.   No wheezes, crackles, or rhonchi.  Cardiovascular:  Regular rate and rhythm. No peripheral edema, cyanosis or pallor.  Gastrointestinal:  Soft, nondistended, nontender. No rebound or guarding. Normal bowel sounds. No appreciable masses or hepatomegaly. Rectal:  Not performed.  Msk:  Symmetrical without gross deformities. Without edema, no deformity or joint abnormality.  Neurologic:  Alert and  oriented x4;  grossly normal neurologically.  Skin:   Dry and intact without significant lesions or rashes.  Psychiatric: Oriented to person, place and time. Demonstrates good judgement and reason without abnormal affect or behaviors.   RELEVANT LABS AND IMAGING: CBC    Component Value Date/Time   WBC 7.9 05/27/2023 1433   RBC 4.37 05/27/2023 1433   HGB 10.8 (L) 05/27/2023 1433   HGB 15.3 12/16/2010 1045   HCT 34.0 (L) 05/27/2023 1433   HCT 43.8 12/16/2010 1045   PLT 300.0 05/27/2023 1433   PLT 224 12/16/2010 1045   MCV 78.0 05/27/2023 1433   MCV 88.9 12/16/2010 1045   MCH 25.1 (L) 05/08/2023 0415   MCHC 31.7 05/27/2023 1433   RDW 19.1 (H) 05/27/2023 1433   RDW 12.5 12/16/2010 1045   LYMPHSABS 1.7 10/06/2022 0859   LYMPHSABS 2.1 12/16/2010 1045   MONOABS 0.6 10/06/2022 0859   MONOABS 0.8 12/16/2010 1045   EOSABS 0.2 10/06/2022 0859   EOSABS 0.3 12/16/2010 1045   BASOSABS 0.1 10/06/2022 0859   BASOSABS 0.1 12/16/2010 1045    CMP     Component Value Date/Time   NA 140 05/27/2023 1433   K 4.1 05/27/2023 1433   CL 107 05/27/2023 1433   CO2 28 05/27/2023 1433   GLUCOSE 129 (H) 05/27/2023 1433   BUN 12 05/27/2023 1433   CREATININE 0.87 05/27/2023 1433    CREATININE 0.73 08/02/2020 1458   CALCIUM 10.2 05/27/2023 1433   CALCIUM 10.8 (H) 02/19/2012 1129   PROT 7.0 05/27/2023 1433   ALBUMIN 3.7 05/27/2023 1433   AST 36 05/27/2023 1433   ALT 32 05/27/2023 1433   ALKPHOS 101 05/27/2023 1433   BILITOT 0.5 05/27/2023 1433   GFRNONAA 59 (L) 05/08/2023 0415   GFRAA >60 07/03/2020 1732     Assessment/Plan:   Iron deficiency anemia, unspecified iron deficiency anemia type History of gastric ulcer Recent hospital admission with EGD showing non bleeding gastric ulcers and anemia. No further melena. On PPI BID. MRI brain with micro hemorrhages, could possibly be leading to her anemia. Discussion with patient and granddaughter about doing colonoscopy with EGD for anemia and screening. Patient is overall quite weak and tends to shuffle. Prep would likely be difficulty for her to go through. No lower GI symptoms and no active bleeding. - EGD to check healing of ulcers - I thoroughly discussed the procedure with the patient (at bedside) to include nature of the procedure, alternatives, benefits, and risks (including but not limited to bleeding, infection, perforation, anesthesia/cardiac pulmonary complications).  Patient verbalized understanding and gave verbal consent to proceed with procedure.  - Continue PPI for 4 more weeks  Fatigue History of anemia, gastric ulcers, and micro hemorrhages in brain per MRI. Fatigue could be secondary to anemia, vitamin deficiencies, age, or alzheimer.   - CBC and CMP - Folate, Vit B12, Iron studies  Lara Mulch [facility] 06/18/2023, 1:29 PM  Cc: [name],*

## 2023-06-18 NOTE — Patient Instructions (Addendum)
Your provider has requested that you go to the basement level for lab work before leaving today. Press "B" on the elevator. The lab is located at the first door on the left as you exit the elevator.   You have been scheduled for an endoscopy. Please follow written instructions given to you at your visit today.  If you use inhalers (even only as needed), please bring them with you on the day of your procedure.  If you take any of the following medications, they will need to be adjusted prior to your procedure:   DO NOT TAKE 7 DAYS PRIOR TO TEST- Trulicity (dulaglutide) Ozempic, Wegovy (semaglutide) Mounjaro (tirzepatide) Bydureon Bcise (exanatide extended release)  DO NOT TAKE 1 DAY PRIOR TO YOUR TEST Rybelsus (semaglutide) Adlyxin (lixisenatide) Victoza (liraglutide) Byetta (exanatide) ___________________________________________________________________________     _______________________________________________________  If your blood pressure at your visit was 140/90 or greater, please contact your primary care physician to follow up on this.  _______________________________________________________  If you are age 77 or older, your body mass index should be between 23-30. Your Body mass index is 29.88 kg/m. If this is out of the aforementioned range listed, please consider follow up with your Primary Care Provider.  _________________________________________________________  The [facility] providers would like to encourage you to use [facility] to communicate with providers for non-urgent requests or questions.  Due to long hold times on the telephone, sending your provider a message by [facility] may be a faster and more efficient way to get a response.  Please allow 48 business hours for a response.  Please remember that this is for non-urgent requests.   Due to recent changes in healthcare laws, you may see the results of your imaging and laboratory studies on MyChart before your  provider has had a chance to review them.  We understand that in some cases there may be results that are confusing or concerning to you. Not all laboratory results come back in the same time frame and the provider may be waiting for multiple results in order to interpret others.  Please give [place] 48 hours in order for your provider to thoroughly review all the results before contacting the office for clarification of your results.     Thank you for choosing me and  Gastroenterology.  Cira Servant, PA-C

## 2023-06-22 NOTE — Progress Notes (Signed)
Agree with the assessment and plan as outlined by Bailey McMichael, PA-C.  Vito [name], DO, FACG  

## 2023-06-26 ENCOUNTER — Encounter: Payer: Self-pay | Admitting: Gastroenterology

## 2023-06-28 MED ORDER — PANTOPRAZOLE SODIUM 40 MG PO TBEC: 40.0000 mg | DELAYED_RELEASE_TABLET | Freq: Two times a day (BID) | ORAL | 0 refills | Status: DC

## 2023-06-29 ENCOUNTER — Ambulatory Visit: Payer: Medicare Other | Admitting: Physical Therapy

## 2023-06-29 ENCOUNTER — Encounter: Payer: Self-pay | Admitting: Physical Therapy

## 2023-06-29 DIAGNOSIS — M6281 Muscle weakness (generalized): Secondary | ICD-10-CM | POA: Diagnosis not present

## 2023-06-29 DIAGNOSIS — R2689 Other abnormalities of gait and mobility: Secondary | ICD-10-CM

## 2023-06-29 NOTE — Therapy (Addendum)
OUTPATIENT PHYSICAL THERAPY TREATMENT   Patient Name: Brandi Walsh MRN: [id number] DOB:12-19-1945, 77 y.o., female Today's Date: 06/29/2023  END OF SESSION:  PT End of Session - 06/29/23 0933     Visit Number 2    Number of Visits 17    Date for PT Re-Evaluation 08/11/23    Authorization Type [employer] medicare    Progress Note Due on Visit 10    PT Start Time [phone]    PT Stop Time 1015    PT Time Calculation (min) 44 min    Equipment Utilized During Treatment Gait belt    Activity Tolerance Patient tolerated treatment well    Behavior During Therapy WFL for tasks assessed/performed              Past Medical History:  Diagnosis Date   Alzheimer's disease (HCC)    COUGH DUE TO ACE INHIBITORS 11/25/2009   Qualifier: Diagnosis of  By: Everardo All MD, Sean A    Depression    DIABETES MELLITUS, TYPE II 10/13/2007   Qualifier: Diagnosis of  By: Samara Snide     HYPERLIPIDEMIA 10/13/2007   Qualifier: Diagnosis of  By: Samara Snide     HYPERTENSION 10/13/2007   Qualifier: Diagnosis of  By: Samara Snide     MENOPAUSAL SYNDROME 10/13/2007   Qualifier: Diagnosis of  By: Charlsie Quest RMA, Lucy     OSTEOPOROSIS 10/13/2007   Qualifier: Diagnosis of  By: Samara Snide     Past Surgical History:  Procedure Laterality Date   ABDOMINAL HYSTERECTOMY     BIOPSY  05/08/2023   Procedure: BIOPSY;  Surgeon: Lynann Bologna, MD;  Location: [facility];  Service: Gastroenterology;;   BREAST BIOPSY  08/2002   ESOPHAGOGASTRODUODENOSCOPY N/A 05/08/2023   Procedure: ESOPHAGOGASTRODUODENOSCOPY (EGD);  Surgeon: Lynann Bologna, MD;  Location: [facility];  Service: Gastroenterology;  Laterality: N/A;   TONSILLECTOMY     Patient Active Problem List   Diagnosis Date Noted   Conjunctivitis of both eyes 05/27/2023   Dysuria 05/27/2023   Melena 05/06/2023   Mild cognitive impairment with memory loss 04/07/2022   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   Abnormal liver enzymes 09/19/2019   Blurred vision 09/19/2019    Hypercalcemia 03/15/2019   Major depression in full remission (HCC) 11/24/2018   Memory loss 12/24/2016   Encounter for well adult exam with abnormal findings 06/13/2013   Hyperparathyroidism, primary (HCC) 06/12/2013   GERD (gastroesophageal reflux disease) 02/19/2012   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   COUGH DUE TO ACE INHIBITORS 11/25/2009   Diabetes (HCC) 10/13/2007   Dyslipidemia 10/13/2007   Essential hypertension 10/13/2007   MENOPAUSAL SYNDROME 10/13/2007   Osteoporosis 10/13/2007    PCP: Elenore Paddy, NP  REFERRING PROVIDER: [name], PA-C  REFERRING DIAG: [phone] (ICD-10-CM) - Gait instability  Rationale for Evaluation and Treatment: Rehabilitation  THERAPY DIAG:  Muscle weakness (generalized)  Other abnormalities of gait and mobility  ONSET DATE: at least 5 years, more notable over past few months  SUBJECTIVE:  SUBJECTIVE STATEMENT: "I had a little soreness in the beginning, I think my muscles were waking up."   PERTINENT HISTORY:  HTN, GERD, DM, osteoporosis, parkinsonism (per neurology notes)  PAIN:  Denies any significant pain affecting mobility  PRECAUTIONS: fall risk, osteoporosis   WEIGHT BEARING RESTRICTIONS: No  FALLS:  Has patient fallen in last 6 months? No  LIVING ENVIRONMENT: 2 story home with husband, her granddaughter lives with her on main floor, has help entering a couple steps. Granddaughter helps with housework, she also works full time - has 77 year old and 77 year old No AD but does get assistance with ambulation from family Has grab bars in bathroom, shower chair. Has tub shower, does sponge baths  OCCUPATION: retired - used to work with [employer]. Enjoys reading, watching TV (wheel of fortune, jeopardy)  PLOF: has help from family for dressing and  bathing; has needed assistance for ~5 years  PATIENT GOALS: be more independent, get out into community more   NEXT MD VISIT: July 23 2023  OBJECTIVE:   DIAGNOSTIC FINDINGS:  05/31/23 Brain MRI "IMPRESSION: 1. No evidence of an acute intracranial abnormality. 2. Mild chronic small vessel ischemic changes within the cerebral white matter, similar to the prior brain MRI of 07/22/2020. 3. There are a few chronic microhemorrhages within the supratentorial brain, as described and increased in number from the prior MRI. 4. Mild generalized cerebral atrophy."  PATIENT SURVEYS:  FOTO deferred given time constraints and history of dementia  COGNITION: Overall cognitive status: conversationally appropriate for today's visit, some increased processing time noted. Hx of cognitive impairments/dementia per chart review     SENSATION/NEURO: Light touch intact all extremities, no extinction   Finger<>nose testing bradykinetic and mild tremors L greater than R No overt ataxia with gait, shuffling pattern, bradykinetic movements and reduced initiation with motor tasks   POSTURE: tendency to hold R UE in elbow flexion, kyphotic and forward head posture   RANGE OF MOTION:     Active  Right eval Left eval  Shoulder flexion ~90 deg ~90 deg  Functional ER combo occiput occiput  Functional IR combo    Knee extension    Ankle dorsiflexion At least neutral Limited on L compared to R   (Blank rows = not tested) (Key: WFL = within functional limits not formally assessed, * = concordant pain, s = stiffness/stretching sensation, NT = not tested)  Comments: limited by stiffness   STRENGTH TESTING:  MMT Right eval Left eval  Shoulder flexion    Shoulder abduction    Elbow flexion    Elbow extension    Grip strength (gross)    Hip flexion 4 4  Hip abduction (modified sitting) 3+ 3+  Knee flexion 4 4  Knee extension 4- 4-  Ankle dorsiflexion 4+ 4+  Ankle plantarflexion     (Blank rows =  not tested) (Key: WFL = within functional limits not formally assessed, * = concordant pain, s = stiffness/stretching sensation, NT = not tested)  Comments:     FUNCTIONAL TESTS:  5xSTS: 18 sec with heavy UE support, unable to achieve full upright   Dynamic Gait Index:   1. Gait Level Surface;  1  2. Change in Gait Speed; 2  3. Gait With Horizontal Head Turns; 2  4. Gait With Vertical Head Turns; 2  5. Gait and Pivot Turn; 1  6. Step Over Obstacle; 1  7. Step Around Obstacles; 1  8. Steps;  1 (per report)   Grading scale:  3 No impairment; 2 Mild impairment/AD use; 1 Moderate impairment; 0 Severe impairment or unable to perform   Total score: 11/24  Cutoff score for fall risk: </=19/24   GAIT: Distance walked: within clinic Assistive device utilized: None Level of assistance: Complete Independence Comments: shuffling pattern, reduced initiation, forward flexed posture, reduced trunk rotation and arm swing (more affected on R)  TODAY'S TREATMENT:                                                                                                                              [facility] Adult PT Treatment:                                                DATE: 06/29/2023 Therapeutic Exercise: Sit to stand 2 x 10  Seated marching on table 2 x 15 alternating L/R Standing hip abduction 2 x 15 with YTB with bil HHA on chair for stability.   Provided HEP for sit to stand, seated marching, and standing hip abduction  Neuromuscular re-ed: Static wide BOS balance 2 x 30 seconds eyes open, 2 x 30 sec eyes closed  Romberg balance 2 x 30 seconds eyes open, 2 x 30 seconds eyes closed Gait training using "big steps" and intentional movement when walking x 150 ft with gait belt and frequent verbal cues.   Therapeutic Activity: Rocking forward/ backward and reviewed mechanics of sitting and standing.   [facility] Adult PT Treatment:                                                DATE: 06/16/23 Deferred in  favor of increased time with education, discussion, and balance assessment  PATIENT EDUCATION:  Education details: Pt education on PT impairments, prognosis, and POC. Informed consent. Rationale for interventions Person educated: Patient and caregiver Education method: Explanation, Demonstration, Tactile cues, Verbal cues Education comprehension: verbalized understanding, returned demonstration, verbal cues required, tactile cues required, and needs further education    HOME EXERCISE PROGRAM: Access Code: [email or web address] URL: [email or web address] Date: 06/29/2023 Prepared by: Lulu Riding  Program Notes Remember with all these exercises to perform with almost exaggerated movements.  Exercises - Sit to Stand  - 1 x daily - 7 x weekly - 2 sets - 10 reps - Seated March  - 1 x daily - 7 x weekly - 2 sets - 20 reps - Standing Hip Abduction with Resistance at Ankles and Counter Support  - 1 x daily - 7 x weekly - 2 sets - 10 reps  ASSESSMENT:  CLINICAL IMPRESSION: 7/30/2024pt arrives to session with no report of pain when starting session. She presents with festinating gait pattern requiring CGA/ gait belt for safety.  Time was taken to work on sit to stand breaking down components to promote biomechanics which she was able to perform better. Continued working on gross LE strengthening which she did well with also. With gait training utilized exaggerated large movements with greatly improved her step length/ cadence. End of session she noted no pain or issues.    EVALUATION: Patient is a pleasant 77 y.o. woman who was seen today for physical therapy evaluation and treatment for gait instability ongoing for last few years. Accompanied by her granddaughter with her consent. On exam pt demonstrates altered gait kinematics and transfer mechanics, mild generalized weakness, impaired coordination, and stiffness throughout BUE/trunk. Pt has known history of parkinsonism per review of  neurology notes (most recent MRI 05/31/23, refer to [facility]), and in clinic today she does demonstrate shuffling gait pattern, generalized bradykinesia, reduced motor initiation, and incoordination. Given this, discussion w/ pt and family about possibility of transferring to our neuro clinic - they state they would prefer to continue treatment at our clinic for now, which is respected. Deferring HEP today given increased time with balance assessment pt discussion/education. No adverse events, tolerates session well overall. Close SBA-CGA provided for safety with mobility within clinic today. Recommend skilled PT to address aforementioned deficits and improve functional tolerance, reduce fall risk. Pt departs today's session in no acute distress, all voiced questions/concerns addressed appropriately from PT perspective.     OBJECTIVE IMPAIRMENTS: Abnormal gait, decreased activity tolerance, decreased balance, decreased coordination, decreased endurance, decreased mobility, difficulty walking, decreased ROM, decreased strength, impaired flexibility, improper body mechanics, postural dysfunction, and pain.   ACTIVITY LIMITATIONS: carrying, lifting, bending, standing, squatting, stairs, transfers, bathing, dressing, reach over head, hygiene/grooming, and locomotion level  PARTICIPATION LIMITATIONS: meal prep, cleaning, laundry, driving, shopping, and community activity  PERSONAL FACTORS: Age, Time since onset of injury/illness/exacerbation, and 3+ comorbidities: HTN, DM, osteoporosis, depression, parkinsonism  are also affecting patient's functional outcome.   REHAB POTENTIAL: Fair given chronicity  CLINICAL DECISION MAKING: Evolving/moderate complexity  EVALUATION COMPLEXITY: Moderate   GOALS: Goals reviewed with patient? No although did review role of PT, POC  SHORT TERM GOALS: Target date: 07/14/2023 Pt will demonstrate appropriate understanding and performance of initially prescribed HEP in order to  facilitate improved independence with management of symptoms.  Baseline: HEP TBD Goal status: INITIAL   2. Pt will be able to perform five consecutive sit to stands with upper extremity support and ability to achieve full upright in order to improve transfer mechanics.  Baseline: heavy UE support, unable to achieve full upright  Goal status: INITIAL  LONG TERM GOALS: Target date: 08/11/2023 1.  Pt will demonstrate global improvement of at least 1 pt in LE MMT in order to facilitate improved functional strength.  Baseline: see MMT chart above  Goal status: INITIAL   2.  Pt will perform Dynamic Gait Index with score greater than or equal to 16/24 in order to indicate reduced fall risk (<19/24 predictive of falls in elderly per [facility] et al 1995, MCID 1.9 per Pardasaney et al 2012)  Baseline: 11/24  Goal status: INITIAL    3. Pt will perform 5xSTS in <15 sec with UE support and achieving full upright in order to demonstrate reduced fall risk and improved functional independence. (MCID of 2.3sec)  Baseline: 18sec with altered kinematics, unable to achieve full upright, heavy UE support  Goal status: INITIAL   4. Pt will demonstrate appropriate performance of final prescribed HEP in order to facilitate improved self-management of symptoms post-discharge.  Baseline: HEP TBD  Goal status: INITIAL    PLAN:  PT FREQUENCY: 1-2x/week  PT DURATION: 8 weeks  PLANNED INTERVENTIONS: Therapeutic exercises, Therapeutic activity, Neuromuscular re-education, Balance training, Gait training, Patient/Family education, Self Care, Joint mobilization, Stair training, Vestibular training, Cryotherapy, Moist heat, Taping, Manual therapy, and Re-evaluation.  PLAN FOR NEXT SESSION: establish HEP. Work on balance. Would likely benefit from change of pace activities, truncal dissociation, transfer mechanics. Continued working on big/ exaggerated movements   Selen Smucker PT, DPT, LAT, ATC  06/29/23   10:52 AM

## 2023-06-30 NOTE — Therapy (Signed)
OUTPATIENT PHYSICAL THERAPY TREATMENT   Patient Name: Brandi Walsh MRN: [id number] DOB:28-May-1946, 77 y.o., female Today's Date: 07/01/2023  END OF SESSION:  PT End of Session - 07/01/23 0756     Visit Number 3    Number of Visits 17    Date for PT Re-Evaluation 08/11/23    Authorization Type [employer] medicare    Progress Note Due on Visit 10    PT Start Time 0800    PT Stop Time 0840    PT Time Calculation (min) 40 min    Activity Tolerance Patient tolerated treatment well    Behavior During Therapy [facility] for tasks assessed/performed               Past Medical History:  Diagnosis Date   Alzheimer's disease (HCC)    COUGH DUE TO ACE INHIBITORS 11/25/2009   Qualifier: Diagnosis of  By: Everardo All MD, Sean A    Depression    DIABETES MELLITUS, TYPE II 10/13/2007   Qualifier: Diagnosis of  By: Samara Snide     HYPERLIPIDEMIA 10/13/2007   Qualifier: Diagnosis of  By: Samara Snide     HYPERTENSION 10/13/2007   Qualifier: Diagnosis of  By: Samara Snide     MENOPAUSAL SYNDROME 10/13/2007   Qualifier: Diagnosis of  By: Charlsie Quest RMA, Lucy     OSTEOPOROSIS 10/13/2007   Qualifier: Diagnosis of  By: Samara Snide     Past Surgical History:  Procedure Laterality Date   ABDOMINAL HYSTERECTOMY     BIOPSY  05/08/2023   Procedure: BIOPSY;  Surgeon: Lynann Bologna, MD;  Location: [facility];  Service: Gastroenterology;;   BREAST BIOPSY  08/2002   ESOPHAGOGASTRODUODENOSCOPY N/A 05/08/2023   Procedure: ESOPHAGOGASTRODUODENOSCOPY (EGD);  Surgeon: Lynann Bologna, MD;  Location: [facility];  Service: Gastroenterology;  Laterality: N/A;   TONSILLECTOMY     Patient Active Problem List   Diagnosis Date Noted   Conjunctivitis of both eyes 05/27/2023   Dysuria 05/27/2023   Melena 05/06/2023   Mild cognitive impairment with memory loss 04/07/2022   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   Abnormal liver enzymes 09/19/2019   Blurred vision 09/19/2019   Hypercalcemia 03/15/2019   Major depression in  full remission (HCC) 11/24/2018   Memory loss 12/24/2016   Encounter for well adult exam with abnormal findings 06/13/2013   Hyperparathyroidism, primary (HCC) 06/12/2013   GERD (gastroesophageal reflux disease) 02/19/2012   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   COUGH DUE TO ACE INHIBITORS 11/25/2009   Diabetes (HCC) 10/13/2007   Dyslipidemia 10/13/2007   Essential hypertension 10/13/2007   MENOPAUSAL SYNDROME 10/13/2007   Osteoporosis 10/13/2007    PCP: Elenore Paddy, NP  REFERRING PROVIDER: [name], PA-C  REFERRING DIAG: [phone] (ICD-10-CM) - Gait instability  Rationale for Evaluation and Treatment: Rehabilitation  THERAPY DIAG:  Muscle weakness (generalized)  Other abnormalities of gait and mobility  ONSET DATE: at least 5 years, more notable over past few months  SUBJECTIVE:  SUBJECTIVE STATEMENT: Pt describes a bit of fatigue and soreness after last session that persisted for a couple days and then cleared up. States she has been doing HEP and not having much soreness from that. No other new updates.    PERTINENT HISTORY:  HTN, GERD, DM, osteoporosis, parkinsonism (per neurology notes)  PAIN:  Denies any significant pain affecting mobility  PRECAUTIONS: fall risk, osteoporosis   WEIGHT BEARING RESTRICTIONS: No  FALLS:  Has patient fallen in last 6 months? No  LIVING ENVIRONMENT: 2 story home with husband, her granddaughter lives with her on main floor, has help entering a couple steps. Granddaughter helps with housework, she also works full time - has 77 year old and 77 year old No AD but does get assistance with ambulation from family Has grab bars in bathroom, shower chair. Has tub shower, does sponge baths  OCCUPATION: retired - used to work with [employer]. Enjoys reading, watching TV  (wheel of fortune, jeopardy)  PLOF: has help from family for dressing and bathing; has needed assistance for ~5 years  PATIENT GOALS: be more independent, get out into community more   NEXT MD VISIT: July 23 2023  OBJECTIVE: (objective measures completed at initial evaluation unless otherwise dated)   DIAGNOSTIC FINDINGS:  05/31/23 Brain MRI "IMPRESSION: 1. No evidence of an acute intracranial abnormality. 2. Mild chronic small vessel ischemic changes within the cerebral white matter, similar to the prior brain MRI of 07/22/2020. 3. There are a few chronic microhemorrhages within the supratentorial brain, as described and increased in number from the prior MRI. 4. Mild generalized cerebral atrophy."  PATIENT SURVEYS:  FOTO deferred given time constraints and history of dementia  COGNITION: Overall cognitive status: conversationally appropriate for today's visit, some increased processing time noted. Hx of cognitive impairments/dementia per chart review     SENSATION/NEURO: Light touch intact all extremities, no extinction   Finger<>nose testing bradykinetic and mild tremors L greater than R No overt ataxia with gait, shuffling pattern, bradykinetic movements and reduced initiation with motor tasks   POSTURE: tendency to hold R UE in elbow flexion, kyphotic and forward head posture   RANGE OF MOTION:     Active  Right eval Left eval  Shoulder flexion ~90 deg ~90 deg  Functional ER combo occiput occiput  Functional IR combo    Knee extension    Ankle dorsiflexion At least neutral Limited on L compared to R   (Blank rows = not tested) (Key: WFL = within functional limits not formally assessed, * = concordant pain, s = stiffness/stretching sensation, NT = not tested)  Comments: limited by stiffness   STRENGTH TESTING:  MMT Right eval Left eval  Shoulder flexion    Shoulder abduction    Elbow flexion    Elbow extension    Grip strength (gross)    Hip flexion 4 4   Hip abduction (modified sitting) 3+ 3+  Knee flexion 4 4  Knee extension 4- 4-  Ankle dorsiflexion 4+ 4+  Ankle plantarflexion     (Blank rows = not tested) (Key: WFL = within functional limits not formally assessed, * = concordant pain, s = stiffness/stretching sensation, NT = not tested)  Comments:     FUNCTIONAL TESTS:  5xSTS: 18 sec with heavy UE support, unable to achieve full upright   Dynamic Gait Index:   1. Gait Level Surface;  1  2. Change in Gait Speed; 2  3. Gait With Horizontal Head Turns; 2  4. Gait With Vertical  Head Turns; 2  5. Gait and Pivot Turn; 1  6. Step Over Obstacle; 1  7. Step Around Obstacles; 1  8. Steps;  1 (per report)   Grading scale: 3 No impairment; 2 Mild impairment/AD use; 1 Moderate impairment; 0 Severe impairment or unable to perform   Total score: 11/24  Cutoff score for fall risk: </=19/24   GAIT: Distance walked: within clinic Assistive device utilized: None Level of assistance: Complete Independence Comments: shuffling pattern, reduced initiation, forward flexed posture, reduced trunk rotation and arm swing (more affected on R)  TODAY'S TREATMENT:                                                                                                                              [facility] Adult PT Treatment:                                                DATE: 07/01/23 Therapeutic Exercise: STS with fwd reach x8 cues for trunk mechanics and pacing STS w fwd reach x8 with cues for improved BOS and start position STS w fwd/OH reach x8 with cues for trunk mechanics Seated march 3x8 BIL LE   Neuromuscular re-ed: Stepping over 3 therabands as external cues, leading with different leg each trial - 2x3 laps cues for increased step length, tactile cues to maintain LOS within BOS  Fwd/retro walking along counter 4 laps cues for step length and posture, pacing Gait 2x117ft cues for stability, pacing and step amplitude    [facility] Adult PT Treatment:                                                 DATE: 06/29/2023 Therapeutic Exercise: Sit to stand 2 x 10  Seated marching on table 2 x 15 alternating L/R Standing hip abduction 2 x 15 with YTB with bil HHA on chair for stability.   Provided HEP for sit to stand, seated marching, and standing hip abduction  Neuromuscular re-ed: Static wide BOS balance 2 x 30 seconds eyes open, 2 x 30 sec eyes closed  Romberg balance 2 x 30 seconds eyes open, 2 x 30 seconds eyes closed Gait training using "big steps" and intentional movement when walking x 150 ft with gait belt and frequent verbal cues.   Therapeutic Activity: Rocking forward/ backward and reviewed mechanics of sitting and standing.   [facility] Adult PT Treatment:                                                DATE: 06/16/23  Deferred in favor of increased time with education, discussion, and balance assessment  PATIENT EDUCATION:  Education details: Pt education on PT impairments, prognosis, and POC. Informed consent. Rationale for interventions Person educated: Patient and caregiver Education method: Explanation, Demonstration, Tactile cues, Verbal cues Education comprehension: verbalized understanding, returned demonstration, verbal cues required, tactile cues required, and needs further education    HOME EXERCISE PROGRAM: Access Code: [email or web address] URL: [email or web address] Date: 06/29/2023 Prepared by: Lulu Riding  Program Notes Remember with all these exercises to perform with almost exaggerated movements.  Exercises - Sit to Stand  - 1 x daily - 7 x weekly - 2 sets - 10 reps - Seated March  - 1 x daily - 7 x weekly - 2 sets - 20 reps - Standing Hip Abduction with Resistance at Ankles and Counter Support  - 1 x daily - 7 x weekly - 2 sets - 10 reps  ASSESSMENT:  CLINICAL IMPRESSION: 07/01/2023 Pt arrives w/ report of some fatigue/soreness after last session that improved in a couple days, feeling good today. Continuing  to demonstrate alterations of gait and difficulty with postural stability, today continuing to work on increased amplitude of movement and improving mechanics with transfers. Also working on increased challenges with dynamic postural stability as described above. CGA and gait belt used throughout for safety. In observation pt is noted to have some swelling about her L MCP joints - no erythema or tenderness to touch, good capillary refill noted, does not appear to extend proximally. Has concordant discomfort in knuckles with trying to make a fist. Pt denies any injury/trauma, attributes it to arthritis. Education is provided on monitoring for red flag symptoms and communication with provider if worsens, she verbalizes agreement/understanding, reinforced this with her husband at end of session. No adverse events, tolerates session well overall and reports feeling improved on departure compared to arrival. Recommend continuing along current POC in order to address relevant deficits and improve functional tolerance. Pt departs today's session in no acute distress, all voiced questions/concerns addressed appropriately from PT perspective.      EVALUATION: Patient is a pleasant 77 y.o. woman who was seen today for physical therapy evaluation and treatment for gait instability ongoing for last few years. Accompanied by her granddaughter with her consent. On exam pt demonstrates altered gait kinematics and transfer mechanics, mild generalized weakness, impaired coordination, and stiffness throughout BUE/trunk. Pt has known history of parkinsonism per review of neurology notes (most recent MRI 05/31/23, refer to [facility]), and in clinic today she does demonstrate shuffling gait pattern, generalized bradykinesia, reduced motor initiation, and incoordination. Given this, discussion w/ pt and family about possibility of transferring to our neuro clinic - they state they would prefer to continue treatment at our clinic for now,  which is respected. Deferring HEP today given increased time with balance assessment pt discussion/education. No adverse events, tolerates session well overall. Close SBA-CGA provided for safety with mobility within clinic today. Recommend skilled PT to address aforementioned deficits and improve functional tolerance, reduce fall risk. Pt departs today's session in no acute distress, all voiced questions/concerns addressed appropriately from PT perspective.     OBJECTIVE IMPAIRMENTS: Abnormal gait, decreased activity tolerance, decreased balance, decreased coordination, decreased endurance, decreased mobility, difficulty walking, decreased ROM, decreased strength, impaired flexibility, improper body mechanics, postural dysfunction, and pain.   ACTIVITY LIMITATIONS: carrying, lifting, bending, standing, squatting, stairs, transfers, bathing, dressing, reach over head, hygiene/grooming, and locomotion level  PARTICIPATION LIMITATIONS: meal prep, cleaning, laundry, driving, shopping, and community activity  PERSONAL FACTORS: Age, Time since onset of injury/illness/exacerbation, and 3+ comorbidities: HTN, DM, osteoporosis, depression, parkinsonism  are also affecting patient's functional outcome.   REHAB POTENTIAL: Fair given chronicity  CLINICAL DECISION MAKING: Evolving/moderate complexity  EVALUATION COMPLEXITY: Moderate   GOALS: Goals reviewed with patient? No although did review role of PT, POC  SHORT TERM GOALS: Target date: 07/14/2023 Pt will demonstrate appropriate understanding and performance of initially prescribed HEP in order to facilitate improved independence with management of symptoms.  Baseline: HEP TBD Goal status: INITIAL   2. Pt will be able to perform five consecutive sit to stands with upper extremity support and ability to achieve full upright in order to improve transfer mechanics.  Baseline: heavy UE support, unable to achieve full upright  Goal status:  INITIAL  LONG TERM GOALS: Target date: 08/11/2023 1.  Pt will demonstrate global improvement of at least 1 pt in LE MMT in order to facilitate improved functional strength.  Baseline: see MMT chart above  Goal status: INITIAL   2.  Pt will perform Dynamic Gait Index with score greater than or equal to 16/24 in order to indicate reduced fall risk (<19/24 predictive of falls in elderly per [facility] et al 1995, MCID 1.9 per Pardasaney et al 2012)  Baseline: 11/24  Goal status: INITIAL    3. Pt will perform 5xSTS in <15 sec with UE support and achieving full upright in order to demonstrate reduced fall risk and improved functional independence. (MCID of 2.3sec)  Baseline: 18sec with altered kinematics, unable to achieve full upright, heavy UE support  Goal status: INITIAL   4. Pt will demonstrate appropriate performance of final prescribed HEP in order to facilitate improved self-management of symptoms post-discharge.   Baseline: HEP TBD  Goal status: INITIAL    PLAN:  PT FREQUENCY: 1-2x/week  PT DURATION: 8 weeks  PLANNED INTERVENTIONS: Therapeutic exercises, Therapeutic activity, Neuromuscular re-education, Balance training, Gait training, Patient/Family education, Self Care, Joint mobilization, Stair training, Vestibular training, Cryotherapy, Moist heat, Taping, Manual therapy, and Re-evaluation.  PLAN FOR NEXT SESSION: update HEP. Continue working on postural stability, increased amplitude of movements.    Ashley Murrain PT, DPT 07/01/2023 8:43 AM

## 2023-07-01 ENCOUNTER — Ambulatory Visit: Payer: Medicare Other | Attending: Physician Assistant | Admitting: Physical Therapy

## 2023-07-01 ENCOUNTER — Encounter: Payer: Self-pay | Admitting: Physical Therapy

## 2023-07-01 DIAGNOSIS — R2689 Other abnormalities of gait and mobility: Secondary | ICD-10-CM | POA: Insufficient documentation

## 2023-07-01 DIAGNOSIS — M6281 Muscle weakness (generalized): Secondary | ICD-10-CM | POA: Insufficient documentation

## 2023-07-02 ENCOUNTER — Encounter: Payer: Self-pay | Admitting: Psychology

## 2023-07-06 ENCOUNTER — Ambulatory Visit: Payer: Medicare Other

## 2023-07-06 ENCOUNTER — Encounter: Payer: Self-pay | Admitting: Psychology

## 2023-07-06 ENCOUNTER — Ambulatory Visit: Payer: Medicare Other | Admitting: Psychology

## 2023-07-06 DIAGNOSIS — F330 Major depressive disorder, recurrent, mild: Secondary | ICD-10-CM

## 2023-07-06 DIAGNOSIS — F0280 Dementia in other diseases classified elsewhere without behavioral disturbance: Secondary | ICD-10-CM

## 2023-07-06 DIAGNOSIS — G309 Alzheimer's disease, unspecified: Secondary | ICD-10-CM | POA: Diagnosis not present

## 2023-07-06 DIAGNOSIS — R4189 Other symptoms and signs involving cognitive functions and awareness: Secondary | ICD-10-CM

## 2023-07-06 HISTORY — DX: Dementia in other diseases classified elsewhere, unspecified severity, without behavioral disturbance, psychotic disturbance, mood disturbance, and anxiety: F02.80

## 2023-07-06 NOTE — Progress Notes (Signed)
   Psychometrician Note   Cognitive testing was administered to Brandi Walsh by Brandi Walsh, B.S. (psychometrist) under the supervision of Dr. Newman Nickels, Ph.D., licensed psychologist on 07/06/2023. Brandi Walsh did not appear overtly distressed by the testing session per behavioral observation or responses across self-report questionnaires. Rest breaks were offered.    The battery of tests administered was selected by Dr. Newman Nickels, Ph.D. with consideration to Brandi Walsh's current level of functioning, the nature of her symptoms, emotional and behavioral responses during interview, level of literacy, observed level of motivation/effort, and the nature of the referral question. This battery was communicated to the psychometrist. Communication between Dr. Newman Nickels, Ph.D. and the psychometrist was ongoing throughout the evaluation and Dr. Newman Nickels, Ph.D. was immediately accessible at all times. Dr. Newman Nickels, Ph.D. provided supervision to the psychometrist on the date of this service to the extent necessary to assure the quality of all services provided.    Brandi Walsh will return within approximately 1-2 weeks for an interactive feedback session with Dr. Milbert Coulter at which time her test performances, clinical impressions, and treatment recommendations will be reviewed in detail. Brandi Walsh understands she can contact our office should she require our assistance before this time.  A total of 120 minutes of billable time were spent face-to-face with Brandi Walsh by the psychometrist. This includes both test administration and scoring time. Billing for these services is reflected in the clinical report generated by Dr. Newman Nickels, Ph.D.  This note reflects time spent with the psychometrician and does not include test scores or any clinical interpretations made by Dr. Milbert Coulter. The full report will follow in a separate note.

## 2023-07-06 NOTE — Progress Notes (Signed)
NEUROPSYCHOLOGICAL EVALUATION Brandi Walsh. Brandi Walsh [facility] Neurology  Date of Evaluation: July 06, 2023  Reason for Referral:   Brandi Walsh is a 77 y.o. left-handed African-American female referred by Brandi Kays, PA-C, to characterize her current cognitive functioning and assist with diagnostic clarity and treatment planning in Brandi context of prior concerns surrounding a neurodegenerative illness and progressive cognitive decline.   Assessment and Plan:   Clinical Impression(s): Brandi Walsh's pattern of performance is suggestive of severe impairment surrounding processing speed, cognitive flexibility, confrontation naming, clock drawing, and delayed retrieval aspects of memory. Additional weaknesses were exhibited across complex attention, verbal fluency, and recognition/consolidation aspects of memory. Performances were appropriate relative to age-matched peers across basic attention, receptive language, non-clock drawing aspects of visuospatial abilities, and initial encoding (i.e., learning) aspects of memory. Functionally, her granddaughter has taken over medication management and provides assistance with financial management and bill paying (which is otherwise managed by Brandi Walsh). Brandi Walsh no longer drives at least in part due to cognitive dysfunction. 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  Relative to prior testing, despite differing instrumentation, Brandi Walsh has exhibited quite prominent and diffuse cognitive decline relative to her previous evaluation in 2018. While Brandi report associated with her 2021 evaluation did not provide any test scores, Brandi description of her functioning does seem to align with what testing captured across Brandi current evaluation.   I share Brandi Walsh's prior concerns surrounding Brandi presence of Alzheimer's disease. Despite being able to initially learn information well, she was fully amnestic (i.e., 0% retention) across 2/3 memory tasks and only exhibited 14% retention across her remaining memory test. She also exhibited overall poor performance across yes/no recognition trials. Taken together, this suggests rapid forgetting and an evolving and already quite significant storage impairment, both of which are Brandi hallmark testing characteristics of this illness. Further impairment/weakness in confrontation naming and semantic fluency unfortunately represent traditional disease progression. She additionally exhibited bilateral hippocampal atrophy of moderate severity on a brain MRI back in 2021, which is a well-established risk factor for this illness.   Brandi Walsh denied current mood concerns across mood-related questionnaires. While psychiatric distress can negatively impact day-to-day functioning, it would not explain amnestic memory and other cognitive patterns worrisome for Alzheimer's disease, nor atrophic patterns across past neuroimaging. I do not feel that her mood concerns can explain ongoing impairments or evidence for progressive cognitive decline since 2018. Concerns for an underlying neurodegenerative illness, namely Alzheimer's disease, remain.   Recommendations: Brandi Walsh has already been prescribed a medication aimed to address memory loss and concerns surrounding Alzheimer's disease (i.e., donepezil/Aricept). She is encouraged to continue taking this medication as prescribed. It is important to highlight that this medication has been shown to slow functional decline in some individuals. There is no current treatment which can stop or reverse cognitive decline when caused by a neurodegenerative illness.   I agree with her family's decision to have Brandi Walsh fully abstain from all driving pursuits.   It will be important for Brandi Walsh to have another person with her when in situations where she may need to process information, weigh Brandi pros and cons of different options, and make decisions, in order to ensure that she fully understands and recalls all information to be considered.  If not already done, Brandi Walsh and her  family may want to discuss her wishes regarding durable power of attorney and medical decision making, so that she can have input into these choices. If they require legal assistance with this, long-term care resource access, or other aspects of estate planning, they could reach out to Brandi Walsh at [phone] for a free consultation. Additionally, they may wish to discuss future plans for caretaking and seek out community options for in home/residential care should they become necessary.  Brandi Walsh is encouraged to attend to lifestyle factors for brain health (e.g., regular physical exercise, good nutrition habits and consideration of Brandi MIND-DASH diet, regular participation in cognitively-stimulating activities, and general stress management techniques), which are likely to have benefits for both emotional adjustment and cognition. In fact, in addition to promoting good general health, regular exercise incorporating aerobic activities (e.g., brisk walking, jogging, cycling, etc.) has been demonstrated to be a very effective treatment for depression and stress, with similar efficacy rates to both antidepressant medication and psychotherapy. Optimal control of vascular risk factors (including safe cardiovascular exercise and adherence to dietary recommendations) is encouraged. Continued participation in activities which provide mental stimulation and social interaction is also recommended.   Important information should be provided to Brandi Walsh in written format in all instances. This information should be placed in a highly frequented and easily visible location within her home to promote recall.  External strategies such as written notes in a consistently used memory journal, visual and nonverbal auditory cues such as a calendar on Brandi refrigerator or appointments with alarm, such as on a cell phone, can also help maximize recall.  To address problems with processing speed, she may wish to consider:   -Ensuring that she is alerted when essential material or instructions are being presented   -Adjusting Brandi speed at which new information is presented   -Allowing for more time in comprehending, processing, and responding in conversation   -Repeating and paraphrasing instructions or conversations aloud  To address problems with fluctuating attention and/or executive dysfunction, she may wish to consider:   -Avoiding external distractions when needing to concentrate   -Limiting exposure to fast paced environments with multiple sensory demands   -Writing down complicated information and using checklists   -Attempting and completing one task at a time (i.e., no multi-tasking)   -Verbalizing aloud each step of a task to maintain focus   -Taking frequent breaks during Brandi completion of steps/tasks to avoid fatigue   -Reducing Brandi amount of information considered at one time   -Scheduling more difficult activities for a time of day where she is usually most alert  Review of Records:   Brandi Walsh was seen in Brandi [facility] ED on 01/15/2017 for progressive depression, energy loss, and decreased eating. She had passive suicidal thoughts and visual hallucinations and, on Brandi morning of ED visit, was refusing to get out of bed and asked her granddaughter to shoot her. Psychiatric admission was recommended and she had IVC hospitalization at [facility] from 01/17/2017-01/25/2017. 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 She was started on Aricept 5 mg, duloxetine 30 mg, olanzapine 5 mg. Over Brandi course of her hospitalization, Brandi Walsh responded well  to treatment with a reduction in paranoia. It was noted that on admission her MMSE was 29/30, but she demonstrated significant confusion and altered mental status. Further outpatient cognitive testing was recommended at Brandi time of her discharge.  She completed a comprehensive neuropsychological evaluation Koleen Distance, Psy.D.) on  03/11/2017. At that time, results suggested very mild global attenuation of cognitive function relative to estimated premorbid intellectual abilities. Brandi only areas of true impairment on cognitive testing were phonemic verbal fluency and memory retrieval for newly learned auditory information. Clinically, she did appear to have mildly disorganized thought processes, but it was difficult to know if this was longstanding and/or related to primary psychiatric disorder. She did not appear to be experiencing significant depression at that time based on behavioral observations, clinical interview, and informant report. Her psychiatric medications appeared to be providing considerable benefit as she was no longer experiencing psychotic symptoms. At that time, Dr. Dimas Chyle felt that recent cognitive changes, acute confusion, and psychosis were more likely due to a primary psychiatric disorder rather than a neurodegenerative condition.   She completed a second neuropsychological evaluation Orie Fisherman, Ph.D.) on 02/28/2020. Results were said to suggest severe memory impairment, coupled with signs of executive dysfunction, reduced processing speed, and impaired confrontation naming. Visuospatial/constructional abilities were intact. Diagnostically, Dr. [name] expressed concerns surrounding an underlying neurodegenerative condition, namely Alzheimer's disease. This was said to be probable but not definitive at that time. Ongoing monitoring was recommended.  Brandi Walsh was most recently seen by [facility] Brandi Kays, PA-C) on 05/14/2023 for follow-up of ongoing  memory concerns. Prominent memory decline was generally not described. Her Walsh noted that, for Brandi most part, Brandi Walsh is able to recall recent conversations and names. He did describe some repetition in conversation. 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 Functionally, her granddaughter is in charge of medication management and her Walsh remains in charge of financial management and bill paying responsibilities. She no longer drives. Performance on a brief cognitive screening instrument (MMSE) in December 2023 was 26/30. Ultimately, Brandi Walsh was referred for a comprehensive neuropsychological evaluation to characterize her cognitive abilities and to assist with diagnostic clarity and treatment planning.   Brain MRI on 07/22/2020 revealed mild cortical atrophy but more moderate hippocampal atrophy bilaterally. Minimal microvascular ischemic disease was noted. Brain MRI on 05/31/2023 was fairly stable. There were comments made surrounding chronic microhemorrhages in Brandi right hippocampus and medial right temporo-occipital lobe which were new relative to her prior MRI.   Past Medical History:  Diagnosis Date   Abnormal liver enzymes 09/19/2019   Blurred vision 09/19/2019   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   Conjunctivitis of both eyes 05/27/2023   Dysuria 05/27/2023   Essential hypertension 10/13/2007   GERD (gastroesophageal reflux disease) 02/19/2012   Hypercalcemia 03/15/2019   Hyperlipidemia 11/11/2020   Localized edema 01/22/2017   Major depressive disorder 11/24/2018   Melena 05/06/2023   Menopausal syndrome 10/13/2007   Mild cognitive impairment with memory loss 04/07/2022   Osteoporosis 10/13/2007   Polycythemia 12/03/2010   Primary hyperparathyroidism 06/12/2013   Type II diabetes mellitus 10/13/2007    Past Surgical History:  Procedure Laterality Date   ABDOMINAL HYSTERECTOMY     BIOPSY  05/08/2023   Procedure: BIOPSY;   Surgeon: Lynann Bologna, MD;  Location: [facility];  Service: Gastroenterology;;   BREAST BIOPSY  08/2002   ESOPHAGOGASTRODUODENOSCOPY N/A 05/08/2023   Procedure: ESOPHAGOGASTRODUODENOSCOPY (EGD);  Surgeon: Lynann Bologna, MD;  Location: [facility];  Service: Gastroenterology;  Laterality: N/A;   TONSILLECTOMY      Current Outpatient Medications:    alendronate (FOSAMAX) 70 MG tablet, Take 70 mg by mouth once a week., Disp: , Rfl:    Cholecalciferol (VITAMIN D) 50 MCG (2000 UT) tablet, Take 2,000 Units by  mouth daily., Disp: , Rfl:    donepezil (ARICEPT) 5 MG tablet, Take 5 mg by mouth at bedtime., Disp: , Rfl:    escitalopram (LEXAPRO) 10 MG tablet, TAKE 1 & 1/2 (ONE & ONE-HALF) TABLETS BY MOUTH ONCE DAILY (Patient taking differently: Take 15 mg by mouth daily.), Disp: 135 tablet, Rfl: 3   losartan (COZAAR) 50 MG tablet, Take 1 tablet (50 mg total) by mouth daily., Disp: 90 tablet, Rfl: 3   mupirocin cream (BACTROBAN) 2 %, Apply 1 Application topically 2 (two) times daily., Disp: 15 g, Rfl: 0   pantoprazole (PROTONIX) 40 MG tablet, Take 1 tablet (40 mg total) by mouth 2 (two) times daily., Disp: 120 tablet, Rfl: 0   rosuvastatin (CRESTOR) 40 MG tablet, Take 1 tablet (40 mg total) by mouth daily., Disp: 90 tablet, Rfl: 3  Clinical Interview:   Brandi following information was obtained during a clinical interview with Brandi Walsh and her granddaughter prior to cognitive testing.  Cognitive Symptoms: Decreased short-term memory: Denied. Her granddaughter was largely in agreement and while she described some mild memory decline over time, she did not describe this as being profound.  Decreased long-term memory: Denied. Decreased attention/concentration: Denied. Reduced processing speed: Endorsed "sometimes." Her granddaughter was in agreement.  Difficulties with executive functions: Denied. They also denied trouble with impulsivity or any significant personality changes.  Difficulties with emotion  regulation: Denied. Difficulties with receptive language: Denied. Difficulties with word finding: Denied. Decreased visuoperceptual ability: Denied.  Difficulties completing ADLs: Endorsed. Her granddaughter has taken over medication management. Her Walsh is in charge of Landscape architect and bill paying responsibilities, with her granddaughter also providing some assistance. Ms. Lakatos no longer drives. This was stopped following her psychiatric hospitalization in 2018 due to concerns with both mental status and physical functioning. Her granddaughter added that there may have been a time or two where Ms. Panagopoulos got lost while driving prior to her discontinuation.   Additional Medical History: History of traumatic brain injury/concussion: Denied. History of stroke: Denied. History of seizure activity: Denied. History of known exposure to toxins: Denied. Symptoms of chronic pain: Denied. Experience of frequent headaches/migraines: Denied. Frequent instances of dizziness/vertigo: Endorsed. She experiences brief symptoms when standing quickly or abruptly changing her positioning.  Sensory changes: Denied.  Balance/coordination difficulties: She described her balance as "It's good." Despite this, she ambulated extremely slowly and did appear mildly unsteady at times. She was very slow to rise out of her chair and appeared extremely cautious. She denied one side of her body seeming less stable than Brandi other. She also denied any recent falls.  Other motor difficulties: Denied outside of periods of heightened anxiety.   Sleep History: Estimated hours obtained each night: 7 hours.  Difficulties falling asleep: Denied. Difficulties staying asleep: Denied. Feels rested and refreshed upon awakening: Endorsed.  History of snoring: Endorsed "a little." History of waking up gasping for air: Denied. Witnessed breath cessation while asleep: Denied.  History of vivid dreaming: Denied. Excessive  movement while asleep: Endorsed. She alluded to some kicking behaviors while asleep, noting that she may have kicked her Walsh in Brandi past. This was said to occur infrequently. Instances of acting out her dreams: Denied.  Psychiatric/Behavioral Health History: Depression: As stated above, Ms. Knepp experienced a fairly abrupt onset of severe depression with Brandi emergence of psychotic symptoms back in 2018. They largely denied prominent psychiatric distress prior to this experience and it remains unclear if there was an underlying medical condition that originally drove  these changes. She was stabilized and has worked with a Contractor over Brandi years. Medication efforts have been beneficial overall and she described her current mood as "calm." She has not worked with an Building services engineer or counselor in Brandi past. Current suicidal ideation, intent, or plan was denied.  Anxiety: Significant anxiety has accompanied depressive symptoms over Brandi years, first becoming prominent in 2018 surrounding her depressive experience and hospitalization. Current medication efforts have been helpful.  Mania: Denied. Trauma History: Denied. Visual/auditory hallucinations: Denied outside of her 2018 hospitalization. Current experiences were denied.  Delusional thoughts: Denied outside of her 2018 hospitalization. Current experiences were denied.   Tobacco: Denied. Alcohol: She denied current alcohol consumption as well as a history of problematic alcohol abuse or dependence.  Recreational drugs: Denied.  Family History: Problem Relation Age of Onset   Anxiety disorder Mother    Hyperparathyroidism Sister    Cancer Neg Hx    Breast cancer Neg Hx    This information was confirmed by Ms. Rinker.  Academic/Vocational History: Highest level of educational attainment: 14 years. She graduated from high school. When asked, she reported completing college and earned a Bachelor's degree in  [place]. However, her granddaughter separately stated that Ms. Dudas did not complete a formal degree and likely completed around two years of college. This report is consistent with what has been described in previous medical records. Ms. Gagne described herself as a good (A/B) student throughout academic settings. No relative weaknesses were described.  History of developmental delay: Denied. History of grade repetition: Denied. Enrollment in special education courses: Denied. History of LD/ADHD: Denied.  Employment: Retired. 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   Evaluation Results:   Behavioral Observations: Ms. Yusupov was accompanied by her granddaughter, arrived to her appointment on time, and was appropriately dressed and groomed. She appeared alert. She ambulated very slowly with a fairly shuffling gait. Very mild instability was witnessed, not to Brandi extent where she required external stabilization. No tremors were observed. She did frequently move her mouth/tongue while not speaking during Brandi evaluation. This was believed to be related missing teeth. She also held her right hand near her ear throughout Brandi majority of Brandi interview. When her granddaughter asked her to drop her hand, Mc. Slay appeared to not be aware of her holding her hand in this position. Her affect was generally relaxed and positive. Spontaneous speech was fluent and word finding difficulties were not observed during Brandi clinical interview. Thought processes were coherent, organized, and normal in content. Insight into her cognitive difficulties appeared poor and I do not believe she has a full appreciation of ongoing cognitive impairment, especially surrounding memory difficulties.   During testing, she was noted to repeatedly touch her ear and face, as well as suck her lip in between her bottom front teeth. Sustained attention was appropriate. Task engagement was adequate and she  persisted when challenged. She did fatigue as Brandi evaluation progressed and it was mildly abbreviated in response. Overall, Ms. Birden was cooperative with Brandi clinical interview and subsequent testing procedures.   Adequacy of Effort: Brandi validity of neuropsychological testing is limited by Brandi extent to which Brandi individual being tested may be assumed to have exerted adequate effort during testing. Ms. Ranz expressed her intention to perform to Brandi best of her abilities and exhibited adequate task engagement and persistence. Scores across stand-alone and embedded performance validity measures were within expectation. As such, Brandi results of Brandi current  evaluation are believed to be a valid representation of Ms. Newby's current cognitive functioning.  Test Results: Ms. Hovde was mildly disoriented at Brandi time of Brandi current evaluation. She incorrectly stated her phone number. She was also one day off when stating Brandi current day of Brandi week, 65 minutes off when estimating Brandi current time, and was unable to name Brandi current clinic. When asked why she was here, she indicated her belief that this was for an outpatient therapy appointment.  Intellectual abilities based upon educational and vocational attainment were estimated to be in Brandi average range. Premorbid abilities were estimated to be within Brandi average range based upon a single-word reading test.   Processing speed was exceptionally low. Basic attention was exceptionally high. More complex attention (e.g., working memory) was well below average to below average. Cognitive flexibility was exceptionally low to well below average. Other aspects of executive functioning were unable to be assessed.  Assessed receptive language abilities were average. Likewise, Ms. Campa did not exhibit any difficulties comprehending task instructions and answered all questions asked of her appropriately. Assessed expressive language was variable. Phonemic fluency  was exceptionally low to well below average, semantic fluency was well below average to average, and confrontation naming was exceptionally low.     Assessed visuospatial/visuoconstructional abilities were below average to average outside of her drawing of a clock. Points were lost on her drawing of a clock due to mild spacing issues with numerical placement and her exhibiting confusion and ultimately placing three clock hands.    Learning (i.e., encoding) of novel verbal information was below average to average. Spontaneous delayed recall (i.e., retrieval) of previously learned information was exceptionally low. Retention rates were 14% across a story learning task, 0% across a list learning task, and 0% across a figure drawing task. Performance across recognition tasks was below expectation, suggesting limited evidence for information consolidation.   Results of emotional screening instruments suggested that recent symptoms of generalized anxiety were in Brandi minimal range, while symptoms of depression were within normal limits. A screening instrument assessing recent sleep quality suggested Brandi presence of minimal sleep dysfunction.  Tables of Scores:   Note: This summary of test scores accompanies Brandi interpretive report and should not be considered in isolation without reference to Brandi appropriate sections in Brandi text. Descriptors are based on appropriate normative data and may be adjusted based on clinical judgment. Terms such as "Within Normal Limits" and "Outside Normal Limits" are used when a more specific description of Brandi test score cannot be determined.       Percentile - Normative Descriptor > 98 - Exceptionally High 91-97 - Well Above Average 75-90 - Above Average 25-74 - Average 9-24 - Below Average 2-8 - Well Below Average < 2 - Exceptionally Low       Validity:   DESCRIPTOR       DCT: --- --- Within Normal Limits  RBANS EI: --- --- Within Normal Limits  WAIS-IV RDS: --- ---  Within Normal Limits       Orientation:      Raw Score Percentile   NAB Orientation, Form 1 23/29 --- ---       Cognitive Screening:      Raw Score Percentile   SLUMS: 19/30 --- ---       RBANS, Form A: Standard Score/ Scaled Score Percentile   Total Score 65 1 Exceptionally Low  Immediate Memory 85 16 Below Average    List Learning 8 25 Average  Story Memory 7 16 Below Average  Visuospatial/Constructional 81 10 Below Average    Figure Copy 7 16 Below Average    Line Orientation 14/20 17-25 Below Average to  Average  Language 57 <1 Exceptionally Low    Picture Naming 6/10 <2 Exceptionally Low    Semantic Fluency 4 2 Well Below Average  Attention 91 27 Average    Digit Span 16 98 Exceptionally High    Coding 1 <1 Exceptionally Low  Delayed Memory 48 <1 Exceptionally Low    List Recall 0/10 <2 Exceptionally Low    List Recognition 15/20 <2 Exceptionally Low    Story Recall 2 <1 Exceptionally Low    Story Recognition 11/12 47-68 Average    Figure Recall 1 <1 Exceptionally Low    Figure Recognition 2/8 2-3 Well Below Average        Intellectual Functioning:      Standard Score Percentile   Test of Premorbid Functioning: 98 45 Average       Attention/Executive Function:     Trail Making Test (TMT): Raw Score (T Score) Percentile     Part A 121 secs.,  0 errors (27) 1 Exceptionally Low    Part B 289 secs.,  0 errors (36) 8 Well Below Average         Scaled Score Percentile   WAIS-IV Digit Span: 9 37 Average    Forward 16 98 Exceptionally High    Backward 6 9 Below Average    Sequencing 5 5 Well Below Average       D-KEFS Verbal Fluency Test: Raw Score (Scaled Score) Percentile     Letter Total Correct 13 (3) 1 Exceptionally Low    Category Total Correct 17 (4) 2 Well Below Average    Category Switching Total Correct 2 (1) <1 Exceptionally Low    Category Switching Accuracy 1 (1) <1 Exceptionally Low      Total Set Loss Errors 0 (13) 84 Above Average      Total  Repetition Errors 0 (13) 84 Above Average       Language:     Verbal Fluency Test: Raw Score (T Score) Percentile     Phonemic Fluency (FAS) 13 (32) 4 Well Below Average    Animal Fluency 11 (43) 25 Average        NAB Language Module, Form 1: T Score Percentile     Auditory Comprehension 45 31 Average    Naming 17/31 (19) <1 Exceptionally Low       Visuospatial/Visuoconstruction:      Raw Score Percentile   Clock Drawing: 5/10 --- Impaired       Mood and Personality:      Raw Score Percentile   PROMIS Depression Questionnaire: 8 --- None to Slight  PROMIS Anxiety Questionnaire: 7 --- None to Slight       Additional Questionnaires:      Raw Score Percentile   PROMIS Sleep Disturbance Questionnaire: 12 --- None to Slight   Informed Consent and Coding/Compliance:   Brandi current evaluation represents a clinical evaluation for Brandi purposes previously outlined by Brandi referral source and is in no way reflective of a forensic evaluation.   Ms. Oliverio was provided with a verbal description of Brandi nature and purpose of Brandi present neuropsychological evaluation. Also reviewed were Brandi foreseeable risks and/or discomforts and benefits of Brandi procedure, limits of confidentiality, and mandatory reporting requirements of this provider. Brandi patient was given Brandi opportunity to ask questions and receive answers  about Brandi evaluation. Oral consent to participate was provided by Brandi patient.   This evaluation was conducted by Newman Nickels, Ph.D., ABPP-CN, board certified clinical neuropsychologist. Ms. Edmister completed a clinical interview with Dr. Milbert Coulter, billed as one unit [phone], and 120 minutes of cognitive testing and scoring, billed as one unit [phone] and three additional units [id number]. Psychometrist Shan Levans, B.S. assisted Dr. Milbert Coulter with test administration and scoring procedures. As a separate and discrete service, one unit [id number] and two units [phone] were billed for Dr. Tammy Sours time spent in  interpretation and report writing.

## 2023-07-07 ENCOUNTER — Encounter: Payer: Self-pay | Admitting: Physical Therapy

## 2023-07-07 ENCOUNTER — Ambulatory Visit: Payer: Medicare Other | Admitting: Physical Therapy

## 2023-07-07 DIAGNOSIS — M6281 Muscle weakness (generalized): Secondary | ICD-10-CM | POA: Diagnosis not present

## 2023-07-07 DIAGNOSIS — R2689 Other abnormalities of gait and mobility: Secondary | ICD-10-CM

## 2023-07-07 NOTE — Therapy (Signed)
OUTPATIENT PHYSICAL THERAPY TREATMENT   Patient Name: Brandi Walsh MRN: [id number] DOB:03/20/1946, 77 y.o., female Today's Date: 07/07/2023  END OF SESSION:  PT End of Session - 07/07/23 0830     Visit Number 4    Number of Visits 17    Date for PT Re-Evaluation 08/11/23    Authorization Type [employer] medicare    Progress Note Due on Visit 10    PT Start Time 0835    PT Stop Time 0915    PT Time Calculation (min) 40 min    Activity Tolerance Patient tolerated treatment well    Behavior During Therapy [facility] for tasks assessed/performed                Past Medical History:  Diagnosis Date   Abnormal liver enzymes 09/19/2019   Blurred vision 09/19/2019   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   Conjunctivitis of both eyes 05/27/2023   Dementia likely due to Alzheimer's disease 07/06/2023   Dysuria 05/27/2023   Essential hypertension 10/13/2007   GERD (gastroesophageal reflux disease) 02/19/2012   Hypercalcemia 03/15/2019   Hyperlipidemia 11/11/2020   Localized edema 01/22/2017   Major depressive disorder 11/24/2018   Melena 05/06/2023   Menopausal syndrome 10/13/2007   Osteoporosis 10/13/2007   Polycythemia 12/03/2010   Primary hyperparathyroidism 06/12/2013   Type II diabetes mellitus 10/13/2007   Past Surgical History:  Procedure Laterality Date   ABDOMINAL HYSTERECTOMY     BIOPSY  05/08/2023   Procedure: BIOPSY;  Surgeon: Lynann Bologna, MD;  Location: [facility];  Service: Gastroenterology;;   BREAST BIOPSY  08/2002   ESOPHAGOGASTRODUODENOSCOPY N/A 05/08/2023   Procedure: ESOPHAGOGASTRODUODENOSCOPY (EGD);  Surgeon: Lynann Bologna, MD;  Location: [facility];  Service: Gastroenterology;  Laterality: N/A;   TONSILLECTOMY     Patient Active Problem List   Diagnosis Date Noted   Dementia likely due to Alzheimer's disease 07/06/2023   Dysuria 05/27/2023   Melena 05/06/2023   Hyperlipidemia 11/11/2020   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   Abnormal liver enzymes 09/19/2019   Blurred vision  09/19/2019   Hypercalcemia 03/15/2019   Major depressive disorder 11/24/2018   Primary hyperparathyroidism 06/12/2013   GERD (gastroesophageal reflux disease) 02/19/2012   Polycythemia 12/03/2010   Type II diabetes mellitus 10/13/2007   Essential hypertension 10/13/2007   Osteoporosis 10/13/2007   Menopausal syndrome 10/13/2007    PCP: Elenore Paddy, NP  REFERRING PROVIDER: [name], PA-C  REFERRING DIAG: [phone] (ICD-10-CM) - Gait instability  Rationale for Evaluation and Treatment: Rehabilitation  THERAPY DIAG:  Muscle weakness (generalized)  Other abnormalities of gait and mobility  ONSET DATE: at least 5 years, more notable over past few months  SUBJECTIVE:  SUBJECTIVE STATEMENT: 07/07/2023 No pain on arrival, states she felt fine after last session. Also notes swelling in her L hand has improved. Reports daily adherence to HEP, no other updates reported   PERTINENT HISTORY:  HTN, GERD, DM, osteoporosis, parkinsonism (per neurology notes)  PAIN:  Denies any significant pain affecting mobility  PRECAUTIONS: fall risk, osteoporosis   WEIGHT BEARING RESTRICTIONS: No  FALLS:  Has patient fallen in last 6 months? No  LIVING ENVIRONMENT: 2 story home with husband, her granddaughter lives with her on main floor, has help entering a couple steps. Granddaughter helps with housework, she also works full time - has 77 year old and 77 year old No AD but does get assistance with ambulation from family Has grab bars in bathroom, shower chair. Has tub shower, does sponge baths  OCCUPATION: retired - used to work with [employer]. Enjoys reading, watching TV (wheel of fortune, jeopardy)  PLOF: has help from family for dressing and bathing; has needed assistance for ~5 years  PATIENT GOALS: be  more independent, get out into community more   NEXT MD VISIT: July 23 2023  OBJECTIVE: (objective measures completed at initial evaluation unless otherwise dated)   DIAGNOSTIC FINDINGS:  05/31/23 Brain MRI "IMPRESSION: 1. No evidence of an acute intracranial abnormality. 2. Mild chronic small vessel ischemic changes within the cerebral white matter, similar to the prior brain MRI of 07/22/2020. 3. There are a few chronic microhemorrhages within the supratentorial brain, as described and increased in number from the prior MRI. 4. Mild generalized cerebral atrophy."  PATIENT SURVEYS:  FOTO deferred given time constraints and history of dementia  COGNITION: Overall cognitive status: conversationally appropriate for today's visit, some increased processing time noted. Hx of cognitive impairments/dementia per chart review     SENSATION/NEURO: Light touch intact all extremities, no extinction   Finger<>nose testing bradykinetic and mild tremors L greater than R No overt ataxia with gait, shuffling pattern, bradykinetic movements and reduced initiation with motor tasks   POSTURE: tendency to hold R UE in elbow flexion, kyphotic and forward head posture   RANGE OF MOTION:     Active  Right eval Left eval  Shoulder flexion ~90 deg ~90 deg  Functional ER combo occiput occiput  Functional IR combo    Knee extension    Ankle dorsiflexion At least neutral Limited on L compared to R   (Blank rows = not tested) (Key: WFL = within functional limits not formally assessed, * = concordant pain, s = stiffness/stretching sensation, NT = not tested)  Comments: limited by stiffness   STRENGTH TESTING:  MMT Right eval Left eval  Shoulder flexion    Shoulder abduction    Elbow flexion    Elbow extension    Grip strength (gross)    Hip flexion 4 4  Hip abduction (modified sitting) 3+ 3+  Knee flexion 4 4  Knee extension 4- 4-  Ankle dorsiflexion 4+ 4+  Ankle plantarflexion      (Blank rows = not tested) (Key: WFL = within functional limits not formally assessed, * = concordant pain, s = stiffness/stretching sensation, NT = not tested)  Comments:     FUNCTIONAL TESTS:  5xSTS: 18 sec with heavy UE support, unable to achieve full upright   Dynamic Gait Index:   1. Gait Level Surface;  1  2. Change in Gait Speed; 2  3. Gait With Horizontal Head Turns; 2  4. Gait With Vertical Head Turns; 2  5. Gait and Pivot Turn;  1  6. Step Over Obstacle; 1  7. Step Around Obstacles; 1  8. Steps;  1 (per report)   Grading scale: 3 No impairment; 2 Mild impairment/AD use; 1 Moderate impairment; 0 Severe impairment or unable to perform   Total score: 11/24  Cutoff score for fall risk: </=19/24   GAIT: Distance walked: within clinic Assistive device utilized: None Level of assistance: Complete Independence Comments: shuffling pattern, reduced initiation, forward flexed posture, reduced trunk rotation and arm swing (more affected on R)  TODAY'S TREATMENT:                                                                                                                              [facility] Adult PT Treatment:                                                DATE: 07/07/23 Neuromuscular re-ed: Partial tandem UE punches 2x8 BIL - cues for truncal rotation, posture, and sequencing; for improved truncal dissociation 6x36ft ambulation with cues to increase amplitude of movement (takes 15 steps for first bout without cueing, cues to return in less steps); subsequent bouts in 13 steps, 12 steps, 12 steps, 11 steps, 10 steps; seated rest every 2 bouts HEP balance update - emphasis on performing safely at home with counter support Therapeutic Activity: Blocked practice STS 4x5 cues for mechanics, sequencing, and UE support  Standing march at counter with weaning UE support 2x8 BIL, for improved weight shifting, cues for posture and stability     [facility] Adult PT Treatment:                                                 DATE: 07/01/23 Therapeutic Exercise: STS with fwd reach x8 cues for trunk mechanics and pacing STS w fwd reach x8 with cues for improved BOS and start position STS w fwd/OH reach x8 with cues for trunk mechanics Seated march 3x8 BIL LE   Neuromuscular re-ed: Stepping over 3 therabands as external cues, leading with different leg each trial - 2x3 laps cues for increased step length, tactile cues to maintain LOS within BOS  Fwd/retro walking along counter 4 laps cues for step length and posture, pacing Gait 2x150ft cues for stability, pacing and step amplitude    [facility] Adult PT Treatment:                                                DATE: 06/29/2023 Therapeutic Exercise: Sit to stand 2 x 10  Seated marching on table 2 x  15 alternating L/R Standing hip abduction 2 x 15 with YTB with bil HHA on chair for stability.   Provided HEP for sit to stand, seated marching, and standing hip abduction  Neuromuscular re-ed: Static wide BOS balance 2 x 30 seconds eyes open, 2 x 30 sec eyes closed  Romberg balance 2 x 30 seconds eyes open, 2 x 30 seconds eyes closed Gait training using "big steps" and intentional movement when walking x 150 ft with gait belt and frequent verbal cues.   Therapeutic Activity: Rocking forward/ backward and reviewed mechanics of sitting and standing.   [facility] Adult PT Treatment:                                                DATE: 06/16/23 Deferred in favor of increased time with education, discussion, and balance assessment  PATIENT EDUCATION:  Education details: rationale for interventions, HEP  Person educated: Patient Education method: Explanation, Demonstration, Tactile cues, Verbal cues Education comprehension: verbalized understanding, returned demonstration, verbal cues required, tactile cues required, and needs further education    HOME EXERCISE PROGRAM: Access Code: [email or web address] URL: [email or web address] Date:  07/07/2023 Prepared by: Fransisco Hertz  Program Notes Remember with all these exercises to perform with almost exaggerated movements.  Exercises - Sit to Stand  - 1 x daily - 7 x weekly - 2 sets - 10 reps - Standing Hip Abduction with Resistance at Ankles and Counter Support  - 1 x daily - 7 x weekly - 2 sets - 10 reps - Standing March with Counter Support  - 1 x daily - 7 x weekly - 2 sets - 8 reps  ASSESSMENT:  CLINICAL IMPRESSION: 07/07/2023 Pt arrives w/o pain, states she felt good after last session. L hand MCP swelling appears to be much improved, pt denies issues since last session. Continuing to work on increasing amplitude of movements, blocked practice for transfers to improve safety. Also introducing truncal dissociation practice today. Continues to demo increased processing time, benefits from faded feedback with emphasis on visual cues/demonstration. No adverse events, pt tolerates well with rest breaks. CGA and gait belt for postural stability challenges for safety. Recommend continuing along current POC in order to address relevant deficits and improve functional tolerance. Pt departs today's session in no acute distress, all voiced questions/concerns addressed appropriately from PT perspective.       EVALUATION: Patient is a pleasant 77 y.o. woman who was seen today for physical therapy evaluation and treatment for gait instability ongoing for last few years. Accompanied by her granddaughter with her consent. On exam pt demonstrates altered gait kinematics and transfer mechanics, mild generalized weakness, impaired coordination, and stiffness throughout BUE/trunk. Pt has known history of parkinsonism per review of neurology notes (most recent MRI 05/31/23, refer to [facility]), and in clinic today she does demonstrate shuffling gait pattern, generalized bradykinesia, reduced motor initiation, and incoordination. Given this, discussion w/ pt and family about possibility of transferring to our  neuro clinic - they state they would prefer to continue treatment at our clinic for now, which is respected. Deferring HEP today given increased time with balance assessment pt discussion/education. No adverse events, tolerates session well overall. Close SBA-CGA provided for safety with mobility within clinic today. Recommend skilled PT to address aforementioned deficits and improve functional tolerance, reduce fall risk. Pt departs today's session in no acute distress, all  voiced questions/concerns addressed appropriately from PT perspective.     OBJECTIVE IMPAIRMENTS: Abnormal gait, decreased activity tolerance, decreased balance, decreased coordination, decreased endurance, decreased mobility, difficulty walking, decreased ROM, decreased strength, impaired flexibility, improper body mechanics, postural dysfunction, and pain.   ACTIVITY LIMITATIONS: carrying, lifting, bending, standing, squatting, stairs, transfers, bathing, dressing, reach over head, hygiene/grooming, and locomotion level  PARTICIPATION LIMITATIONS: meal prep, cleaning, laundry, driving, shopping, and community activity  PERSONAL FACTORS: Age, Time since onset of injury/illness/exacerbation, and 3+ comorbidities: HTN, DM, osteoporosis, depression, parkinsonism  are also affecting patient's functional outcome.   REHAB POTENTIAL: Fair given chronicity  CLINICAL DECISION MAKING: Evolving/moderate complexity  EVALUATION COMPLEXITY: Moderate   GOALS: Goals reviewed with patient? No although did review role of PT, POC  SHORT TERM GOALS: Target date: 07/14/2023 Pt will demonstrate appropriate understanding and performance of initially prescribed HEP in order to facilitate improved independence with management of symptoms.  Baseline: HEP TBD 07/07/23: pt endorses daily performance of HEP Goal status: MET  2. Pt will be able to perform five consecutive sit to stands with upper extremity support and ability to achieve full upright  in order to improve transfer mechanics.  Baseline: heavy UE support, unable to achieve full upright  07/07/23: able to perform blocked practice 4x5 STS with UE support, achieving full upright  Goal status: MET  LONG TERM GOALS: Target date: 08/11/2023 1.  Pt will demonstrate global improvement of at least 1 pt in LE MMT in order to facilitate improved functional strength.  Baseline: see MMT chart above  Goal status: INITIAL   2.  Pt will perform Dynamic Gait Index with score greater than or equal to 16/24 in order to indicate reduced fall risk (<19/24 predictive of falls in elderly per [facility] et al 1995, MCID 1.9 per Pardasaney et al 2012)  Baseline: 11/24  Goal status: INITIAL    3. Pt will perform 5xSTS in <15 sec with UE support and achieving full upright in order to demonstrate reduced fall risk and improved functional independence. (MCID of 2.3sec)  Baseline: 18sec with altered kinematics, unable to achieve full upright, heavy UE support  Goal status: INITIAL   4. Pt will demonstrate appropriate performance of final prescribed HEP in order to facilitate improved self-management of symptoms post-discharge.   Baseline: HEP TBD  Goal status: INITIAL    PLAN:  PT FREQUENCY: 1-2x/week  PT DURATION: 8 weeks  PLANNED INTERVENTIONS: Therapeutic exercises, Therapeutic activity, Neuromuscular re-education, Balance training, Gait training, Patient/Family education, Self Care, Joint mobilization, Stair training, Vestibular training, Cryotherapy, Moist heat, Taping, Manual therapy, and Re-evaluation.  PLAN FOR NEXT SESSION: update/review HEP PRN. Continue working on postural stability, increased amplitude of movements.     Ashley Murrain PT, DPT 07/07/2023 9:22 AM

## 2023-07-09 ENCOUNTER — Ambulatory Visit (INDEPENDENT_AMBULATORY_CARE_PROVIDER_SITE_OTHER): Payer: Medicare Other | Admitting: Nurse Practitioner

## 2023-07-09 ENCOUNTER — Encounter: Payer: Self-pay | Admitting: Physical Therapy

## 2023-07-09 ENCOUNTER — Other Ambulatory Visit: Payer: Self-pay | Admitting: Nurse Practitioner

## 2023-07-09 ENCOUNTER — Ambulatory Visit: Payer: Medicare Other | Admitting: Physical Therapy

## 2023-07-09 VITALS — BP 122/64 | HR 93 | Temp 97.6°F | Ht 60.0 in | Wt 156.0 lb

## 2023-07-09 DIAGNOSIS — R2689 Other abnormalities of gait and mobility: Secondary | ICD-10-CM

## 2023-07-09 DIAGNOSIS — I10 Essential (primary) hypertension: Secondary | ICD-10-CM

## 2023-07-09 DIAGNOSIS — M6281 Muscle weakness (generalized): Secondary | ICD-10-CM

## 2023-07-09 DIAGNOSIS — E119 Type 2 diabetes mellitus without complications: Secondary | ICD-10-CM | POA: Diagnosis not present

## 2023-07-09 DIAGNOSIS — R319 Hematuria, unspecified: Secondary | ICD-10-CM | POA: Diagnosis not present

## 2023-07-09 DIAGNOSIS — R809 Proteinuria, unspecified: Secondary | ICD-10-CM

## 2023-07-09 DIAGNOSIS — K219 Gastro-esophageal reflux disease without esophagitis: Secondary | ICD-10-CM

## 2023-07-09 DIAGNOSIS — Z1239 Encounter for other screening for malignant neoplasm of breast: Secondary | ICD-10-CM | POA: Insufficient documentation

## 2023-07-09 DIAGNOSIS — G309 Alzheimer's disease, unspecified: Secondary | ICD-10-CM

## 2023-07-09 DIAGNOSIS — F0280 Dementia in other diseases classified elsewhere without behavioral disturbance: Secondary | ICD-10-CM

## 2023-07-09 LAB — URINALYSIS, ROUTINE W REFLEX MICROSCOPIC
Bilirubin Urine: NEGATIVE
Hgb urine dipstick: NEGATIVE
Leukocytes,Ua: NEGATIVE
Nitrite: NEGATIVE
RBC / HPF: NONE SEEN (ref 0–?)
Specific Gravity, Urine: 1.025 (ref 1.000–1.030)
Urine Glucose: NEGATIVE
Urobilinogen, UA: 4 — AB (ref 0.0–1.0)
pH: 6 (ref 5.0–8.0)

## 2023-07-09 LAB — MICROALBUMIN / CREATININE URINE RATIO
Creatinine,U: 199.1 mg/dL
Microalb Creat Ratio: 2.8 mg/g (ref 0.0–30.0)
Microalb, Ur: 5.5 mg/dL — ABNORMAL HIGH (ref 0.0–1.9)

## 2023-07-09 NOTE — Therapy (Signed)
OUTPATIENT PHYSICAL THERAPY TREATMENT   Patient Name: Brandi Walsh MRN: [id number] DOB:1945-12-04, 77 y.o., female Today's Date: 07/09/2023  END OF SESSION:  PT End of Session - 07/09/23 0759     Visit Number 5    Number of Visits 17    Date for PT Re-Evaluation 08/11/23    Authorization Type [employer] medicare    PT Start Time 0800    PT Stop Time 0845    PT Time Calculation (min) 45 min                Past Medical History:  Diagnosis Date   Abnormal liver enzymes 09/19/2019   Blurred vision 09/19/2019   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   Conjunctivitis of both eyes 05/27/2023   Dementia likely due to Alzheimer's disease 07/06/2023   Dysuria 05/27/2023   Essential hypertension 10/13/2007   GERD (gastroesophageal reflux disease) 02/19/2012   Hypercalcemia 03/15/2019   Hyperlipidemia 11/11/2020   Localized edema 01/22/2017   Major depressive disorder 11/24/2018   Melena 05/06/2023   Menopausal syndrome 10/13/2007   Osteoporosis 10/13/2007   Polycythemia 12/03/2010   Primary hyperparathyroidism 06/12/2013   Type II diabetes mellitus 10/13/2007   Past Surgical History:  Procedure Laterality Date   ABDOMINAL HYSTERECTOMY     BIOPSY  05/08/2023   Procedure: BIOPSY;  Surgeon: Lynann Bologna, MD;  Location: [facility];  Service: Gastroenterology;;   BREAST BIOPSY  08/2002   ESOPHAGOGASTRODUODENOSCOPY N/A 05/08/2023   Procedure: ESOPHAGOGASTRODUODENOSCOPY (EGD);  Surgeon: Lynann Bologna, MD;  Location: [facility];  Service: Gastroenterology;  Laterality: N/A;   TONSILLECTOMY     Patient Active Problem List   Diagnosis Date Noted   Dementia likely due to Alzheimer's disease 07/06/2023   Dysuria 05/27/2023   Melena 05/06/2023   Hyperlipidemia 11/11/2020   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   Abnormal liver enzymes 09/19/2019   Blurred vision 09/19/2019   Hypercalcemia 03/15/2019   Major depressive disorder 11/24/2018   Primary hyperparathyroidism 06/12/2013   GERD (gastroesophageal  reflux disease) 02/19/2012   Polycythemia 12/03/2010   Type II diabetes mellitus 10/13/2007   Essential hypertension 10/13/2007   Osteoporosis 10/13/2007   Menopausal syndrome 10/13/2007    PCP: Elenore Paddy, NP  REFERRING PROVIDER: [name], PA-C  REFERRING DIAG: [phone] (ICD-10-CM) - Gait instability  Rationale for Evaluation and Treatment: Rehabilitation  THERAPY DIAG:  Muscle weakness (generalized)  Other abnormalities of gait and mobility  ONSET DATE: at least 5 years, more notable over past few months  SUBJECTIVE:  SUBJECTIVE STATEMENT: 07/09/2023 No pain on arrival. Pt reports feeling fine this morning.    PERTINENT HISTORY:  HTN, GERD, DM, osteoporosis, parkinsonism (per neurology notes)  PAIN:  Denies any significant pain affecting mobility  PRECAUTIONS: fall risk, osteoporosis   WEIGHT BEARING RESTRICTIONS: No  FALLS:  Has patient fallen in last 6 months? No  LIVING ENVIRONMENT: 2 story home with husband, her granddaughter lives with her on main floor, has help entering a couple steps. Granddaughter helps with housework, she also works full time - has 77 year old and 77 year old No AD but does get assistance with ambulation from family Has grab bars in bathroom, shower chair. Has tub shower, does sponge baths  OCCUPATION: retired - used to work with [employer]. Enjoys reading, watching TV (wheel of fortune, jeopardy)  PLOF: has help from family for dressing and bathing; has needed assistance for ~5 years  PATIENT GOALS: be more independent, get out into community more   NEXT MD VISIT: July 23 2023  OBJECTIVE: (objective measures completed at initial evaluation unless otherwise dated)   DIAGNOSTIC FINDINGS:  05/31/23 Brain MRI "IMPRESSION: 1. No evidence of an acute  intracranial abnormality. 2. Mild chronic small vessel ischemic changes within the cerebral white matter, similar to the prior brain MRI of 07/22/2020. 3. There are a few chronic microhemorrhages within the supratentorial brain, as described and increased in number from the prior MRI. 4. Mild generalized cerebral atrophy."  PATIENT SURVEYS:  FOTO deferred given time constraints and history of dementia  COGNITION: Overall cognitive status: conversationally appropriate for today's visit, some increased processing time noted. Hx of cognitive impairments/dementia per chart review     SENSATION/NEURO: Light touch intact all extremities, no extinction   Finger<>nose testing bradykinetic and mild tremors L greater than R No overt ataxia with gait, shuffling pattern, bradykinetic movements and reduced initiation with motor tasks   POSTURE: tendency to hold R UE in elbow flexion, kyphotic and forward head posture   RANGE OF MOTION:     Active  Right eval Left eval  Shoulder flexion ~90 deg ~90 deg  Functional ER combo occiput occiput  Functional IR combo    Knee extension    Ankle dorsiflexion At least neutral Limited on L compared to R   (Blank rows = not tested) (Key: WFL = within functional limits not formally assessed, * = concordant pain, s = stiffness/stretching sensation, NT = not tested)  Comments: limited by stiffness   STRENGTH TESTING:  MMT Right eval Left eval  Shoulder flexion    Shoulder abduction    Elbow flexion    Elbow extension    Grip strength (gross)    Hip flexion 4 4  Hip abduction (modified sitting) 3+ 3+  Knee flexion 4 4  Knee extension 4- 4-  Ankle dorsiflexion 4+ 4+  Ankle plantarflexion     (Blank rows = not tested) (Key: WFL = within functional limits not formally assessed, * = concordant pain, s = stiffness/stretching sensation, NT = not tested)  Comments:     FUNCTIONAL TESTS:  5xSTS: 18 sec with heavy UE support, unable to achieve  full upright   Dynamic Gait Index:   1. Gait Level Surface;  1  2. Change in Gait Speed; 2  3. Gait With Horizontal Head Turns; 2  4. Gait With Vertical Head Turns; 2  5. Gait and Pivot Turn; 1  6. Step Over Obstacle; 1  7. Step Around Obstacles; 1  8. Steps;  1 (  per report)   Grading scale: 3 No impairment; 2 Mild impairment/AD use; 1 Moderate impairment; 0 Severe impairment or unable to perform   Total score: 11/24  Cutoff score for fall risk: </=19/24   GAIT: Distance walked: within clinic Assistive device utilized: None Level of assistance: Complete Independence Comments: shuffling pattern, reduced initiation, forward flexed posture, reduced trunk rotation and arm swing (more affected on R)  TODAY'S TREATMENT:                                                                                                                              [facility] Adult PT Treatment:                                                DATE: 07/09/23 Therapeutic Exercise: Nustep L3 UE/LE x 5 minutes  LAQ 4# alternating x 15 each  STS x 6 without UE Neuromuscular re-ed: Narrow stance on AIREX- added head turns and nods Semi tandem On AIREX- added head turns and nods Therapeutic Activity: Side stepping in // bars 2 lengths Gait with cues for step length  Retro walking in // bars cues for step length- 2 lengths Tandem gait intermittent UE touch as needed - 2 lengths Stepping over obstacles (yoga blocks) step to pattern and alternating pattern with 1 UE assist, 4 obstacles x 4  Side stepping over obstacles x 4  obstacles each way   [facility] Adult PT Treatment:                                                DATE: 07/07/23 Neuromuscular re-ed: Partial tandem UE punches 2x8 BIL - cues for truncal rotation, posture, and sequencing; for improved truncal dissociation 6x40ft ambulation with cues to increase amplitude of movement (takes 15 steps for first bout without cueing, cues to return in less steps); subsequent  bouts in 13 steps, 12 steps, 12 steps, 11 steps, 10 steps; seated rest every 2 bouts HEP balance update - emphasis on performing safely at home with counter support Therapeutic Activity: Blocked practice STS 4x5 cues for mechanics, sequencing, and UE support  Standing march at counter with weaning UE support 2x8 BIL, for improved weight shifting, cues for posture and stability     [facility] Adult PT Treatment:                                                DATE: 07/01/23 Therapeutic Exercise: STS with fwd reach x8 cues for trunk mechanics and pacing STS w fwd reach x8 with cues for improved  BOS and start position STS w fwd/OH reach x8 with cues for trunk mechanics Seated march 3x8 BIL LE   Neuromuscular re-ed: Stepping over 3 therabands as external cues, leading with different leg each trial - 2x3 laps cues for increased step length, tactile cues to maintain LOS within BOS  Fwd/retro walking along counter 4 laps cues for step length and posture, pacing Gait 2x135ft cues for stability, pacing and step amplitude    [facility] Adult PT Treatment:                                                DATE: 06/29/2023 Therapeutic Exercise: Sit to stand 2 x 10  Seated marching on table 2 x 15 alternating L/R Standing hip abduction 2 x 15 with YTB with bil HHA on chair for stability.   Provided HEP for sit to stand, seated marching, and standing hip abduction  Neuromuscular re-ed: Static wide BOS balance 2 x 30 seconds eyes open, 2 x 30 sec eyes closed  Romberg balance 2 x 30 seconds eyes open, 2 x 30 seconds eyes closed Gait training using "big steps" and intentional movement when walking x 150 ft with gait belt and frequent verbal cues.   Therapeutic Activity: Rocking forward/ backward and reviewed mechanics of sitting and standing.   [facility] Adult PT Treatment:                                                DATE: 06/16/23 Deferred in favor of increased time with education, discussion, and balance  assessment  PATIENT EDUCATION:  Education details: rationale for interventions, HEP  Person educated: Patient Education method: Explanation, Demonstration, Tactile cues, Verbal cues Education comprehension: verbalized understanding, returned demonstration, verbal cues required, tactile cues required, and needs further education    HOME EXERCISE PROGRAM: Access Code: [email or web address] URL: [email or web address] Date: 07/07/2023 Prepared by: Fransisco Hertz  Program Notes Remember with all these exercises to perform with almost exaggerated movements.  Exercises - Sit to Stand  - 1 x daily - 7 x weekly - 2 sets - 10 reps - Standing Hip Abduction with Resistance at Ankles and Counter Support  - 1 x daily - 7 x weekly - 2 sets - 10 reps - Standing March with Counter Support  - 1 x daily - 7 x weekly - 2 sets - 8 reps  ASSESSMENT:  CLINICAL IMPRESSION: 07/09/2023 Pt arrives w/o pain, states she felt good after last session. Continued with gait and balance with focus on stepping over obstacles to increase step length. He did well today, with most of treatment in closed chain. She required 3 seated rest breaks.    EVALUATION: Patient is a pleasant 77 y.o. woman who was seen today for physical therapy evaluation and treatment for gait instability ongoing for last few years. Accompanied by her granddaughter with her consent. On exam pt demonstrates altered gait kinematics and transfer mechanics, mild generalized weakness, impaired coordination, and stiffness throughout BUE/trunk. Pt has known history of parkinsonism per review of neurology notes (most recent MRI 05/31/23, refer to [facility] and in clinic today she does demonstrate shuffling gait pattern, generalized bradykinesia, reduced motor initiation, and incoordination. Given this, discussion w/ pt and family about possibility of transferring  to our neuro clinic - they state they would prefer to continue treatment at our clinic for now, which is  respected. Deferring HEP today given increased time with balance assessment pt discussion/education. No adverse events, tolerates session well overall. Close SBA-CGA provided for safety with mobility within clinic today. Recommend skilled PT to address aforementioned deficits and improve functional tolerance, reduce fall risk. Pt departs today's session in no acute distress, all voiced questions/concerns addressed appropriately from PT perspective.     OBJECTIVE IMPAIRMENTS: Abnormal gait, decreased activity tolerance, decreased balance, decreased coordination, decreased endurance, decreased mobility, difficulty walking, decreased ROM, decreased strength, impaired flexibility, improper body mechanics, postural dysfunction, and pain.   ACTIVITY LIMITATIONS: carrying, lifting, bending, standing, squatting, stairs, transfers, bathing, dressing, reach over head, hygiene/grooming, and locomotion level  PARTICIPATION LIMITATIONS: meal prep, cleaning, laundry, driving, shopping, and community activity  PERSONAL FACTORS: Age, Time since onset of injury/illness/exacerbation, and 3+ comorbidities: HTN, DM, osteoporosis, depression, parkinsonism  are also affecting patient's functional outcome.   REHAB POTENTIAL: Fair given chronicity  CLINICAL DECISION MAKING: Evolving/moderate complexity  EVALUATION COMPLEXITY: Moderate   GOALS: Goals reviewed with patient? No although did review role of PT, POC  SHORT TERM GOALS: Target date: 07/14/2023 Pt will demonstrate appropriate understanding and performance of initially prescribed HEP in order to facilitate improved independence with management of symptoms.  Baseline: HEP TBD 07/07/23: pt endorses daily performance of HEP Goal status: MET  2. Pt will be able to perform five consecutive sit to stands with upper extremity support and ability to achieve full upright in order to improve transfer mechanics.  Baseline: heavy UE support, unable to achieve full  upright  07/07/23: able to perform blocked practice 4x5 STS with UE support, achieving full upright  Goal status: MET  LONG TERM GOALS: Target date: 08/11/2023 1.  Pt will demonstrate global improvement of at least 1 pt in LE MMT in order to facilitate improved functional strength.  Baseline: see MMT chart above  Goal status: INITIAL   2.  Pt will perform Dynamic Gait Index with score greater than or equal to 16/24 in order to indicate reduced fall risk (<19/24 predictive of falls in elderly per [facility] et al 1995, MCID 1.9 per Pardasaney et al 2012)  Baseline: 11/24  Goal status: INITIAL    3. Pt will perform 5xSTS in <15 sec with UE support and achieving full upright in order to demonstrate reduced fall risk and improved functional independence. (MCID of 2.3sec)  Baseline: 18sec with altered kinematics, unable to achieve full upright, heavy UE support  Goal status: INITIAL   4. Pt will demonstrate appropriate performance of final prescribed HEP in order to facilitate improved self-management of symptoms post-discharge.   Baseline: HEP TBD  Goal status: INITIAL    PLAN:  PT FREQUENCY: 1-2x/week  PT DURATION: 8 weeks  PLANNED INTERVENTIONS: Therapeutic exercises, Therapeutic activity, Neuromuscular re-education, Balance training, Gait training, Patient/Family education, Self Care, Joint mobilization, Stair training, Vestibular training, Cryotherapy, Moist heat, Taping, Manual therapy, and Re-evaluation.  PLAN FOR NEXT SESSION: update/review HEP PRN. Continue working on postural stability, increased amplitude of movements.     Jannette Spanner, PTA 07/09/23 9:04 AM Phone: [phone] Fax: [phone]

## 2023-07-09 NOTE — Assessment & Plan Note (Signed)
Chronic  Continue donepezil 5 mg daily and follow-up with neurology as scheduled.

## 2023-07-09 NOTE — Assessment & Plan Note (Signed)
Chronic, improved and at goal Continue losartan daily.

## 2023-07-09 NOTE — Assessment & Plan Note (Signed)
Chronic, stable Continue to follow-up with GI.  Continue on pantoprazole 40 mg twice a day

## 2023-07-09 NOTE — Progress Notes (Signed)
Established Patient Office Visit  Subjective   Patient ID: Brandi Walsh, female    DOB: 02-19-1946  Age: 77 y.o. MRN: [id number]  Chief Complaint  Patient presents with   Hematuria    Patient arrives today accompanied by her granddaughter for follow-up. Continues to follow with GI regarding her anemia after she was recently hospitalized for GI bleed. Evaluation did identify ulcers, but they were nonbleeding. Is now on PPI.  She continues to deny seeing blood in stool or dark-tarry stools.  Hemoglobin has been steady around 10.  She does have intermittent dizziness but reports this is mild.  Is working with therapy to improve strength and endurance.  She also saw neurology since last time she saw me and was diagnosed with dementia most likely related to Alzheimer's.  She is now on Aricept, she reports he is tolerating the medication well.  Initial lab work with me did identify A1c of 6.9, she is on statin therapy and ARB.  Not on something for blood sugars currently.  Urine testing also identified microscopic hematuria.  No infection identified on urine culture.    ROS: see HPI    Objective:     BP 122/64   Pulse 93   Temp 97.6 F (36.4 C) (Temporal)   Ht 5' (1.524 m)   Wt 156 lb (70.8 kg)   SpO2 96%   BMI 30.47 kg/m    Physical Exam Vitals reviewed.  Constitutional:      General: She is not in acute distress.    Appearance: Normal appearance.  HENT:     Head: Normocephalic and atraumatic.  Neck:     Vascular: No carotid bruit.  Cardiovascular:     Rate and Rhythm: Normal rate and regular rhythm.     Pulses: Normal pulses.     Heart sounds: Normal heart sounds.  Pulmonary:     Effort: Pulmonary effort is normal.     Breath sounds: Normal breath sounds.  Skin:    General: Skin is warm and dry.  Neurological:     General: No focal deficit present.     Mental Status: She is alert and oriented to person, place, and time.  Psychiatric:        Mood and Affect:  Mood normal.        Behavior: Behavior normal.        Judgment: Judgment normal.      No results found for any visits on 07/09/23.    The ASCVD Risk score (Arnett DK, et al., 2019) failed to calculate for the following reasons:   The valid total cholesterol range is 130 to 320 mg/dL    Assessment & Plan:   Problem List Items Addressed This Visit       Cardiovascular and Mediastinum   Essential hypertension    Chronic, improved and at goal Continue losartan daily.        Digestive   GERD (gastroesophageal reflux disease)    Chronic, stable Continue to follow-up with GI.  Continue on pantoprazole 40 mg twice a day        Endocrine   Type II diabetes mellitus    Chronic, goal A1c less than 8, last A1c 6.9 which is at goal Per shared decision making we will not start her on medication for her blood sugars but she will continue on rosuvastatin and losartan. Follow-up in 3 months consider getting A1C and lipid panel at follow-up.        Nervous and  Auditory   Dementia likely due to Alzheimer's disease    Chronic  Continue donepezil 5 mg daily and follow-up with neurology as scheduled.        Other   Hematuria - Primary    Incidental finding Recheck urinalysis and check for albuminuria. Pending results consider referral to either urology or nephrology      Relevant Orders   Urinalysis, Routine w reflex microscopic   Microalbumin / creatinine urine ratio   Encounter for screening for malignant neoplasm of breast    Patient like to continue with breast cancer screening mammogram.  Screening mammogram ordered today.      Relevant Orders   MM 3D SCREENING MAMMOGRAM BILATERAL BREAST    Return in about 3 months (around 10/09/2023) for F/U with .  Total time spent in this counter was 38 minutes including face-to-face evaluation, review of previous medical records, and development/discussion of treatment plan.   Elenore Paddy, NP

## 2023-07-09 NOTE — Assessment & Plan Note (Signed)
Chronic, goal A1c less than 8, last A1c 6.9 which is at goal Per shared decision making we will not start her on medication for her blood sugars but she will continue on rosuvastatin and losartan. Follow-up in 3 months consider getting A1C and lipid panel at follow-up.

## 2023-07-09 NOTE — Assessment & Plan Note (Signed)
Incidental finding Recheck urinalysis and check for albuminuria. Pending results consider referral to either urology or nephrology

## 2023-07-09 NOTE — Assessment & Plan Note (Signed)
Patient like to continue with breast cancer screening mammogram.  Screening mammogram ordered today.

## 2023-07-13 ENCOUNTER — Ambulatory Visit: Payer: Medicare Other | Admitting: Physical Therapy

## 2023-07-13 ENCOUNTER — Encounter: Payer: Medicare Other | Admitting: Psychology

## 2023-07-15 ENCOUNTER — Ambulatory Visit: Payer: Medicare Other | Admitting: Physical Therapy

## 2023-07-15 ENCOUNTER — Encounter: Payer: Self-pay | Admitting: Physical Therapy

## 2023-07-15 ENCOUNTER — Ambulatory Visit: Payer: Medicare Other | Admitting: Psychology

## 2023-07-15 DIAGNOSIS — R2689 Other abnormalities of gait and mobility: Secondary | ICD-10-CM

## 2023-07-15 DIAGNOSIS — G309 Alzheimer's disease, unspecified: Secondary | ICD-10-CM

## 2023-07-15 DIAGNOSIS — M6281 Muscle weakness (generalized): Secondary | ICD-10-CM | POA: Diagnosis not present

## 2023-07-15 DIAGNOSIS — F0280 Dementia in other diseases classified elsewhere without behavioral disturbance: Secondary | ICD-10-CM | POA: Diagnosis not present

## 2023-07-15 NOTE — Progress Notes (Signed)
   Neuropsychology Feedback Session Eligha Bridegroom. [facility] [place] Department of Neurology  Reason for Referral:   Brandi Walsh is a 77 y.o. left-handed African-American female referred by Marlowe Kays, PA-C, to characterize her current cognitive functioning and assist with diagnostic clarity and treatment planning in the context of prior concerns surrounding a neurodegenerative illness and progressive cognitive decline.   Feedback:   Brandi Walsh completed a comprehensive neuropsychological evaluation on 07/06/2023. Please refer to that encounter for the full report and recommendations. Briefly, results suggested severe impairment surrounding processing speed, cognitive flexibility, confrontation naming, clock drawing, and delayed retrieval aspects of memory. Additional weaknesses were exhibited across complex attention, verbal fluency, and recognition/consolidation aspects of memory. Relative to prior testing, despite differing instrumentation, Brandi Walsh has exhibited quite prominent and diffuse cognitive decline relative to her previous evaluation in 2018. While the report associated with her 2021 evaluation did not provide any test scores, the description of her functioning does seem to align with what testing captured across the current evaluation. I share Dr. McDermott's prior concerns surrounding the presence of Alzheimer's disease. Despite being able to initially learn information well, she was fully amnestic (i.e., 0% retention) across 2/3 memory tasks and only exhibited 14% retention across her remaining memory test. She also exhibited overall poor performance across yes/no recognition trials. Taken together, this suggests rapid forgetting and an evolving and already quite significant storage impairment, both of which are the hallmark testing characteristics of this illness. Further impairment/weakness in confrontation naming and semantic fluency unfortunately represent traditional  disease progression. She additionally exhibited bilateral hippocampal atrophy of moderate severity on a brain MRI back in 2021, which is a well-established risk factor for this illness.   Brandi Walsh was accompanied by her husband and granddaughter during the current feedback session. Content of the current session focused on the results of her neuropsychological evaluation. Brandi Walsh was given the opportunity to ask questions and her questions were answered. She was encouraged to reach out should additional questions arise. A copy of her report was provided at the conclusion of the visit.      One unit [phone] was billed for Dr. Tammy Sours time spent preparing for, conducting, and documenting the current feedback session with Brandi Walsh.

## 2023-07-15 NOTE — Therapy (Signed)
OUTPATIENT PHYSICAL THERAPY TREATMENT   Patient Name: Brandi Walsh MRN: [id number] DOB:05-Dec-1945, 77 y.o., female Today's Date: 07/15/2023  END OF SESSION:  PT End of Session - 07/15/23 0849     Visit Number 6    Number of Visits 17    Date for PT Re-Evaluation 08/11/23    Authorization Type [employer] medicare    PT Start Time 0848    PT Stop Time 0929    PT Time Calculation (min) 41 min                Past Medical History:  Diagnosis Date   Abnormal liver enzymes 09/19/2019   Blurred vision 09/19/2019   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   Conjunctivitis of both eyes 05/27/2023   Dementia likely due to Alzheimer's disease 07/06/2023   Dysuria 05/27/2023   Essential hypertension 10/13/2007   GERD (gastroesophageal reflux disease) 02/19/2012   Hypercalcemia 03/15/2019   Hyperlipidemia 11/11/2020   Localized edema 01/22/2017   Major depressive disorder 11/24/2018   Melena 05/06/2023   Menopausal syndrome 10/13/2007   Osteoporosis 10/13/2007   Polycythemia 12/03/2010   Primary hyperparathyroidism 06/12/2013   Type II diabetes mellitus 10/13/2007   Past Surgical History:  Procedure Laterality Date   ABDOMINAL HYSTERECTOMY     BIOPSY  05/08/2023   Procedure: BIOPSY;  Surgeon: Lynann Bologna, MD;  Location: [facility];  Service: Gastroenterology;;   BREAST BIOPSY  08/2002   ESOPHAGOGASTRODUODENOSCOPY N/A 05/08/2023   Procedure: ESOPHAGOGASTRODUODENOSCOPY (EGD);  Surgeon: Lynann Bologna, MD;  Location: [facility];  Service: Gastroenterology;  Laterality: N/A;   TONSILLECTOMY     Patient Active Problem List   Diagnosis Date Noted   Hematuria 07/09/2023   Encounter for screening for malignant neoplasm of breast 07/09/2023   Dementia likely due to Alzheimer's disease 07/06/2023   Dysuria 05/27/2023   Hyperlipidemia 11/11/2020   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   Abnormal liver enzymes 09/19/2019   Blurred vision 09/19/2019   Hypercalcemia 03/15/2019   Major depressive disorder  11/24/2018   Primary hyperparathyroidism 06/12/2013   GERD (gastroesophageal reflux disease) 02/19/2012   Polycythemia 12/03/2010   Type II diabetes mellitus 10/13/2007   Essential hypertension 10/13/2007   Osteoporosis 10/13/2007   Menopausal syndrome 10/13/2007    PCP: Elenore Paddy, NP  REFERRING PROVIDER: [name], PA-C  REFERRING DIAG: [id number] (ICD-10-CM) - Gait instability  Rationale for Evaluation and Treatment: Rehabilitation  THERAPY DIAG:  Muscle weakness (generalized)  Other abnormalities of gait and mobility  ONSET DATE: at least 5 years, more notable over past few months  SUBJECTIVE:  SUBJECTIVE STATEMENT: 07/15/2023 No pain on arrival. Pt reports feeling fine this morning.    PERTINENT HISTORY:  HTN, GERD, DM, osteoporosis, parkinsonism (per neurology notes)  PAIN:  Denies any significant pain affecting mobility  PRECAUTIONS: fall risk, osteoporosis   WEIGHT BEARING RESTRICTIONS: No  FALLS:  Has patient fallen in last 6 months? No  LIVING ENVIRONMENT: 2 story home with husband, her granddaughter lives with her on main floor, has help entering a couple steps. Granddaughter helps with housework, she also works full time - has 77 year old and 77 year old No AD but does get assistance with ambulation from family Has grab bars in bathroom, shower chair. Has tub shower, does sponge baths  OCCUPATION: retired - used to work with [employer]. Enjoys reading, watching TV (wheel of fortune, jeopardy)  PLOF: has help from family for dressing and bathing; has needed assistance for ~5 years  PATIENT GOALS: be more independent, get out into community more   NEXT MD VISIT: July 23 2023  OBJECTIVE: (objective measures completed at initial evaluation unless otherwise dated)    DIAGNOSTIC FINDINGS:  05/31/23 Brain MRI "IMPRESSION: 1. No evidence of an acute intracranial abnormality. 2. Mild chronic small vessel ischemic changes within the cerebral white matter, similar to the prior brain MRI of 07/22/2020. 3. There are a few chronic microhemorrhages within the supratentorial brain, as described and increased in number from the prior MRI. 4. Mild generalized cerebral atrophy."  PATIENT SURVEYS:  FOTO deferred given time constraints and history of dementia  COGNITION: Overall cognitive status: conversationally appropriate for today's visit, some increased processing time noted. Hx of cognitive impairments/dementia per chart review     SENSATION/NEURO: Light touch intact all extremities, no extinction   Finger<>nose testing bradykinetic and mild tremors L greater than R No overt ataxia with gait, shuffling pattern, bradykinetic movements and reduced initiation with motor tasks   POSTURE: tendency to hold R UE in elbow flexion, kyphotic and forward head posture   RANGE OF MOTION:     Active  Right eval Left eval  Shoulder flexion ~90 deg ~90 deg  Functional ER combo occiput occiput  Functional IR combo    Knee extension    Ankle dorsiflexion At least neutral Limited on L compared to R   (Blank rows = not tested) (Key: WFL = within functional limits not formally assessed, * = concordant pain, s = stiffness/stretching sensation, NT = not tested)  Comments: limited by stiffness   STRENGTH TESTING:  MMT Right eval Left eval  Shoulder flexion    Shoulder abduction    Elbow flexion    Elbow extension    Grip strength (gross)    Hip flexion 4 4  Hip abduction (modified sitting) 3+ 3+  Knee flexion 4 4  Knee extension 4- 4-  Ankle dorsiflexion 4+ 4+  Ankle plantarflexion     (Blank rows = not tested) (Key: WFL = within functional limits not formally assessed, * = concordant pain, s = stiffness/stretching sensation, NT = not tested)  Comments:      FUNCTIONAL TESTS:  5xSTS: 18 sec with heavy UE support, unable to achieve full upright   Dynamic Gait Index:   1. Gait Level Surface;  1  2. Change in Gait Speed; 2  3. Gait With Horizontal Head Turns; 2  4. Gait With Vertical Head Turns; 2  5. Gait and Pivot Turn; 1  6. Step Over Obstacle; 1  7. Step Around Obstacles; 1  8. Steps;  1 (  per report)   Grading scale: 3 No impairment; 2 Mild impairment/AD use; 1 Moderate impairment; 0 Severe impairment or unable to perform   Total score: 11/24  Cutoff score for fall risk: </=19/24   GAIT: Distance walked: within clinic Assistive device utilized: None Level of assistance: Complete Independence Comments: shuffling pattern, reduced initiation, forward flexed posture, reduced trunk rotation and arm swing (more affected on R)  TODAY'S TREATMENT:                                                                                                                              [facility] Adult PT Treatment:                                                DATE: 07/15/23 Therapeutic Exercise: Nustep L4 UE/LE x 5 minutes  Standing hip abduction 10 x 2  Standing March 10 x 2  Heel/toe raises Gastro stretch x 2 each, at counter   Neuromuscular re-ed: Narrow stance on AIREX- added head turns and nods Semi tandem On AIREX- added head turns and nods Therapeutic Activity: Stepping over obstacles (yoga blocks) step to pattern and alternating pattern without UE assist, 4 obstacles x 4  Side stepping over obstacles x 4  obstacles each way, 1 UE Gait in //bars with cues or step length and heel strike with and without UE Side stepping in parallel bars without UE, cues for step length     [facility] Adult PT Treatment:                                                DATE: 07/09/23 Therapeutic Exercise: Nustep L3 UE/LE x 5 minutes  LAQ 4# alternating x 15 each  STS x 6 without UE Neuromuscular re-ed: Narrow stance on AIREX- added head turns and nods Semi  tandem On AIREX- added head turns and nods Therapeutic Activity: Side stepping in // bars 2 lengths Gait with cues for step length  Retro walking in // bars cues for step length- 2 lengths Tandem gait intermittent UE touch as needed - 2 lengths Stepping over obstacles (yoga blocks) step to pattern and alternating pattern with 1 UE assist, 4 obstacles x 4  Side stepping over obstacles x 4  obstacles each way   [facility] Adult PT Treatment:                                                DATE: 07/07/23 Neuromuscular re-ed: Partial tandem UE punches 2x8 BIL - cues for truncal rotation, posture, and sequencing; for  improved truncal dissociation 6x31ft ambulation with cues to increase amplitude of movement (takes 15 steps for first bout without cueing, cues to return in less steps); subsequent bouts in 13 steps, 12 steps, 12 steps, 11 steps, 10 steps; seated rest every 2 bouts HEP balance update - emphasis on performing safely at home with counter support Therapeutic Activity: Blocked practice STS 4x5 cues for mechanics, sequencing, and UE support  Standing march at counter with weaning UE support 2x8 BIL, for improved weight shifting, cues for posture and stability     [facility] Adult PT Treatment:                                                DATE: 07/01/23 Therapeutic Exercise: STS with fwd reach x8 cues for trunk mechanics and pacing STS w fwd reach x8 with cues for improved BOS and start position STS w fwd/OH reach x8 with cues for trunk mechanics Seated march 3x8 BIL LE   Neuromuscular re-ed: Stepping over 3 therabands as external cues, leading with different leg each trial - 2x3 laps cues for increased step length, tactile cues to maintain LOS within BOS  Fwd/retro walking along counter 4 laps cues for step length and posture, pacing Gait 2x155ft cues for stability, pacing and step amplitude    [facility] Adult PT Treatment:                                                DATE: 06/29/2023 Therapeutic  Exercise: Sit to stand 2 x 10  Seated marching on table 2 x 15 alternating L/R Standing hip abduction 2 x 15 with YTB with bil HHA on chair for stability.   Provided HEP for sit to stand, seated marching, and standing hip abduction  Neuromuscular re-ed: Static wide BOS balance 2 x 30 seconds eyes open, 2 x 30 sec eyes closed  Romberg balance 2 x 30 seconds eyes open, 2 x 30 seconds eyes closed Gait training using "big steps" and intentional movement when walking x 150 ft with gait belt and frequent verbal cues.   Therapeutic Activity: Rocking forward/ backward and reviewed mechanics of sitting and standing.   [facility] Adult PT Treatment:                                                DATE: 06/16/23 Deferred in favor of increased time with education, discussion, and balance assessment  PATIENT EDUCATION:  Education details: rationale for interventions, HEP  Person educated: Patient Education method: Explanation, Demonstration, Tactile cues, Verbal cues Education comprehension: verbalized understanding, returned demonstration, verbal cues required, tactile cues required, and needs further education    HOME EXERCISE PROGRAM: Access Code: [email or web address] URL: [email or web address] Date: 07/07/2023 Prepared by: Fransisco Hertz  Program Notes Remember with all these exercises to perform with almost exaggerated movements.  Exercises - Sit to Stand  - 1 x daily - 7 x weekly - 2 sets - 10 reps - Standing Hip Abduction with Resistance at Ankles and Counter Support  - 1 x daily - 7 x weekly - 2 sets - 10 reps - Standing  March with Counter Support  - 1 x daily - 7 x weekly - 2 sets - 8 reps  ASSESSMENT:  CLINICAL IMPRESSION: 07/15/2023 Pt arrives w/o pain, states she felt good after last session. Continued with gait and balance with focus on stepping over obstacles to increase step length. She did well today, with most of treatment in closed chain. No rest breaks required today. Husband  attended session today.    EVALUATION: Patient is a pleasant 77 y.o. woman who was seen today for physical therapy evaluation and treatment for gait instability ongoing for last few years. Accompanied by her granddaughter with her consent. On exam pt demonstrates altered gait kinematics and transfer mechanics, mild generalized weakness, impaired coordination, and stiffness throughout BUE/trunk. Pt has known history of parkinsonism per review of neurology notes (most recent MRI 05/31/23, refer to [facility]), and in clinic today she does demonstrate shuffling gait pattern, generalized bradykinesia, reduced motor initiation, and incoordination. Given this, discussion w/ pt and family about possibility of transferring to our neuro clinic - they state they would prefer to continue treatment at our clinic for now, which is respected. Deferring HEP today given increased time with balance assessment pt discussion/education. No adverse events, tolerates session well overall. Close SBA-CGA provided for safety with mobility within clinic today. Recommend skilled PT to address aforementioned deficits and improve functional tolerance, reduce fall risk. Pt departs today's session in no acute distress, all voiced questions/concerns addressed appropriately from PT perspective.     OBJECTIVE IMPAIRMENTS: Abnormal gait, decreased activity tolerance, decreased balance, decreased coordination, decreased endurance, decreased mobility, difficulty walking, decreased ROM, decreased strength, impaired flexibility, improper body mechanics, postural dysfunction, and pain.   ACTIVITY LIMITATIONS: carrying, lifting, bending, standing, squatting, stairs, transfers, bathing, dressing, reach over head, hygiene/grooming, and locomotion level  PARTICIPATION LIMITATIONS: meal prep, cleaning, laundry, driving, shopping, and community activity  PERSONAL FACTORS: Age, Time since onset of injury/illness/exacerbation, and 3+ comorbidities: HTN, DM,  osteoporosis, depression, parkinsonism  are also affecting patient's functional outcome.   REHAB POTENTIAL: Fair given chronicity  CLINICAL DECISION MAKING: Evolving/moderate complexity  EVALUATION COMPLEXITY: Moderate   GOALS: Goals reviewed with patient? No although did review role of PT, POC  SHORT TERM GOALS: Target date: 07/14/2023 Pt will demonstrate appropriate understanding and performance of initially prescribed HEP in order to facilitate improved independence with management of symptoms.  Baseline: HEP TBD 07/07/23: pt endorses daily performance of HEP Goal status: MET  2. Pt will be able to perform five consecutive sit to stands with upper extremity support and ability to achieve full upright in order to improve transfer mechanics.  Baseline: heavy UE support, unable to achieve full upright  07/07/23: able to perform blocked practice 4x5 STS with UE support, achieving full upright  Goal status: MET  LONG TERM GOALS: Target date: 08/11/2023 1.  Pt will demonstrate global improvement of at least 1 pt in LE MMT in order to facilitate improved functional strength.  Baseline: see MMT chart above  Goal status: INITIAL   2.  Pt will perform Dynamic Gait Index with score greater than or equal to 16/24 in order to indicate reduced fall risk (<19/24 predictive of falls in elderly per [facility] et al 1995, MCID 1.9 per Pardasaney et al 2012)  Baseline: 11/24  Goal status: INITIAL    3. Pt will perform 5xSTS in <15 sec with UE support and achieving full upright in order to demonstrate reduced fall risk and improved functional independence. (MCID of 2.3sec)  Baseline: 18sec with  altered kinematics, unable to achieve full upright, heavy UE support  Goal status: INITIAL   4. Pt will demonstrate appropriate performance of final prescribed HEP in order to facilitate improved self-management of symptoms post-discharge.   Baseline: HEP TBD  Goal status: INITIAL    PLAN:  PT FREQUENCY:  1-2x/week  PT DURATION: 8 weeks  PLANNED INTERVENTIONS: Therapeutic exercises, Therapeutic activity, Neuromuscular re-education, Balance training, Gait training, Patient/Family education, Self Care, Joint mobilization, Stair training, Vestibular training, Cryotherapy, Moist heat, Taping, Manual therapy, and Re-evaluation.  PLAN FOR NEXT SESSION: update/review HEP PRN. Continue working on postural stability, increased amplitude of movements.     Jannette Spanner, PTA 07/15/23 11:24 AM Phone: [phone] Fax: [phone]

## 2023-07-15 NOTE — Therapy (Signed)
OUTPATIENT PHYSICAL THERAPY TREATMENT   Patient Name: Brandi Walsh MRN: [id number] DOB:December 05, 1945, 77 y.o., female Today's Date: 07/20/2023  END OF SESSION:  PT End of Session - 07/20/23 0855     Visit Number 7    Number of Visits 17    Date for PT Re-Evaluation 08/11/23    Authorization Type [employer] medicare    Progress Note Due on Visit 10    PT Start Time 0850    PT Stop Time 0930    PT Time Calculation (min) 40 min    Equipment Utilized During Treatment Gait belt    Activity Tolerance Patient tolerated treatment well    Behavior During Therapy WFL for tasks assessed/performed                 Past Medical History:  Diagnosis Date   Abnormal liver enzymes 09/19/2019   Blurred vision 09/19/2019   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   Conjunctivitis of both eyes 05/27/2023   Dementia likely due to Alzheimer's disease 07/06/2023   Dysuria 05/27/2023   Essential hypertension 10/13/2007   GERD (gastroesophageal reflux disease) 02/19/2012   Hypercalcemia 03/15/2019   Hyperlipidemia 11/11/2020   Localized edema 01/22/2017   Major depressive disorder 11/24/2018   Melena 05/06/2023   Menopausal syndrome 10/13/2007   Osteoporosis 10/13/2007   Polycythemia 12/03/2010   Primary hyperparathyroidism 06/12/2013   Type II diabetes mellitus 10/13/2007   Past Surgical History:  Procedure Laterality Date   ABDOMINAL HYSTERECTOMY     BIOPSY  05/08/2023   Procedure: BIOPSY;  Surgeon: Lynann Bologna, MD;  Location: [facility];  Service: Gastroenterology;;   BREAST BIOPSY  08/2002   ESOPHAGOGASTRODUODENOSCOPY N/A 05/08/2023   Procedure: ESOPHAGOGASTRODUODENOSCOPY (EGD);  Surgeon: Lynann Bologna, MD;  Location: [facility];  Service: Gastroenterology;  Laterality: N/A;   TONSILLECTOMY     Patient Active Problem List   Diagnosis Date Noted   Hematuria 07/09/2023   Dementia likely due to Alzheimer's disease 07/06/2023   Dysuria 05/27/2023   Hyperlipidemia 11/11/2020   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    Abnormal liver enzymes 09/19/2019   Blurred vision 09/19/2019   Hypercalcemia 03/15/2019   Major depressive disorder 11/24/2018   Primary hyperparathyroidism 06/12/2013   GERD (gastroesophageal reflux disease) 02/19/2012   Polycythemia 12/03/2010   Type II diabetes mellitus 10/13/2007   Essential hypertension 10/13/2007   Osteoporosis 10/13/2007   Menopausal syndrome 10/13/2007    PCP: Elenore Paddy, NP  REFERRING PROVIDER: [name], PA-C  REFERRING DIAG: [phone] (ICD-10-CM) - Gait instability  Rationale for Evaluation and Treatment: Rehabilitation  THERAPY DIAG:  Muscle weakness (generalized)  Other abnormalities of gait and mobility  ONSET DATE: at least 5 years, more notable over past few months  SUBJECTIVE:  SUBJECTIVE STATEMENT: 07/20/2023 No pain on arrival. Pt reports feeling fine this morning. Needing CGAx 1 to ambulate to nu step   PERTINENT HISTORY:  HTN, GERD, DM, osteoporosis, parkinsonism (per neurology notes)  PAIN:  Denies any significant pain affecting mobility  PRECAUTIONS: fall risk, osteoporosis   WEIGHT BEARING RESTRICTIONS: No  FALLS:  Has patient fallen in last 6 months? No  LIVING ENVIRONMENT: 2 story home with husband, her granddaughter lives with her on main floor, has help entering a couple steps. Granddaughter helps with housework, she also works full time - has 77 year old and 77 year old No AD but does get assistance with ambulation from family Has grab bars in bathroom, shower chair. Has tub shower, does sponge baths  OCCUPATION: retired - used to work with [employer]. Enjoys reading, watching TV (wheel of fortune, jeopardy)  PLOF: has help from family for dressing and bathing; has needed assistance for ~5 years  PATIENT GOALS: be more independent,  get out into community more   NEXT MD VISIT: July 23 2023  OBJECTIVE: (objective measures completed at initial evaluation unless otherwise dated)   DIAGNOSTIC FINDINGS:  05/31/23 Brain MRI "IMPRESSION: 1. No evidence of an acute intracranial abnormality. 2. Mild chronic small vessel ischemic changes within the cerebral white matter, similar to the prior brain MRI of 07/22/2020. 3. There are a few chronic microhemorrhages within the supratentorial brain, as described and increased in number from the prior MRI. 4. Mild generalized cerebral atrophy."  PATIENT SURVEYS:  FOTO deferred given time constraints and history of dementia  COGNITION: Overall cognitive status: conversationally appropriate for today's visit, some increased processing time noted. Hx of cognitive impairments/dementia per chart review     SENSATION/NEURO: Light touch intact all extremities, no extinction   Finger<>nose testing bradykinetic and mild tremors L greater than R No overt ataxia with gait, shuffling pattern, bradykinetic movements and reduced initiation with motor tasks   POSTURE: tendency to hold R UE in elbow flexion, kyphotic and forward head posture   RANGE OF MOTION:     Active  Right eval Left eval  Shoulder flexion ~90 deg ~90 deg  Functional ER combo occiput occiput  Functional IR combo    Knee extension    Ankle dorsiflexion At least neutral Limited on L compared to R   (Blank rows = not tested) (Key: WFL = within functional limits not formally assessed, * = concordant pain, s = stiffness/stretching sensation, NT = not tested)  Comments: limited by stiffness   STRENGTH TESTING:  MMT Right eval Left eval  Shoulder flexion    Shoulder abduction    Elbow flexion    Elbow extension    Grip strength (gross)    Hip flexion 4 4  Hip abduction (modified sitting) 3+ 3+  Knee flexion 4 4  Knee extension 4- 4-  Ankle dorsiflexion 4+ 4+  Ankle plantarflexion     (Blank rows = not  tested) (Key: WFL = within functional limits not formally assessed, * = concordant pain, s = stiffness/stretching sensation, NT = not tested)  Comments:     FUNCTIONAL TESTS:  5xSTS: 18 sec with heavy UE support, unable to achieve full upright   Dynamic Gait Index:   1. Gait Level Surface;  1  2. Change in Gait Speed; 2  3. Gait With Horizontal Head Turns; 2  4. Gait With Vertical Head Turns; 2  5. Gait and Pivot Turn; 1  6. Step Over Obstacle; 1  7. Step Around  Obstacles; 1  8. Steps;  1 (per report)   Grading scale: 3 No impairment; 2 Mild impairment/AD use; 1 Moderate impairment; 0 Severe impairment or unable to perform   Total score: 11/24  Cutoff score for fall risk: </=19/24   GAIT: Distance walked: within clinic Assistive device utilized: None Level of assistance: Complete Independence Comments: shuffling pattern, reduced initiation, forward flexed posture, reduced trunk rotation and arm swing (more affected on R)  TODAY'S TREATMENT:  [facility] Adult PT Treatment:                                                DATE: 07-20-23 Therapeutic Exercise: Nustep L4 UE/LE x 5 minutes  Standing March 10 x 2  Walking march 2 lengths of llbars 15 feet Heel/toe raises 2 x 15 Standing hip abduction 10 x 2  hold 3 sec Sidestepping 2 lengths  of llbars  Sit to stand with PT CGA x 1 with pt holding 8 lb DB x 10 Gastro stretch x 2 each, in llbars PT assist  Neuromuscular re-ed: Narrow stance on AIREX- added head turns and nods Semi tandem On AIREX- added head turns and nods Therapeutic Activity: Stepping over obstacles (yoga blocks) step to pattern and alternating pattern without UE assist, 4 obstacles x 4  Side stepping over obstacles x 4  obstacles each way, 1 UE Gait in //bars with cues or step length and heel strike with and without UE                                                                                                                              [facility] Adult PT  Treatment:                                                DATE: 07/15/23 Therapeutic Exercise: Nustep L4 UE/LE x 5 minutes  Standing hip abduction 10 x 2  Standing March 10 x 2  Heel/toe raises Gastro stretch x 2 each, at counter   Neuromuscular re-ed: Narrow stance on AIREX- added head turns and nods Semi tandem On AIREX- added head turns and nods Therapeutic Activity: Stepping over obstacles (yoga blocks) step to pattern and alternating pattern without UE assist, 4 obstacles x 4  Side stepping over obstacles x 4  obstacles each way, 1 UE Gait in //bars with cues or step length and heel strike with and without UE Side stepping in parallel bars without UE, cues for step length     [facility] Adult PT Treatment:  DATE: 07/09/23 Therapeutic Exercise: Nustep L3 UE/LE x 5 minutes  LAQ 4# alternating x 15 each  STS x 6 without UE Neuromuscular re-ed: Narrow stance on AIREX- added head turns and nods Semi tandem On AIREX- added head turns and nods Therapeutic Activity: Side stepping in // bars 2 lengths Gait with cues for step length  Retro walking in // bars cues for step length- 2 lengths Tandem gait intermittent UE touch as needed - 2 lengths Stepping over obstacles (yoga blocks) step to pattern and alternating pattern with 1 UE assist, 4 obstacles x 4  Side stepping over obstacles x 4  obstacles each way   [facility] Adult PT Treatment:                                                DATE: 07/07/23 Neuromuscular re-ed: Partial tandem UE punches 2x8 BIL - cues for truncal rotation, posture, and sequencing; for improved truncal dissociation 6x78ft ambulation with cues to increase amplitude of movement (takes 15 steps for first bout without cueing, cues to return in less steps); subsequent bouts in 13 steps, 12 steps, 12 steps, 11 steps, 10 steps; seated rest every 2 bouts HEP balance update - emphasis on performing safely at home with counter  support Therapeutic Activity: Blocked practice STS 4x5 cues for mechanics, sequencing, and UE support  Standing march at counter with weaning UE support 2x8 BIL, for improved weight shifting, cues for posture and stability     [facility] Adult PT Treatment:                                                DATE: 07/01/23 Therapeutic Exercise: STS with fwd reach x8 cues for trunk mechanics and pacing STS w fwd reach x8 with cues for improved BOS and start position STS w fwd/OH reach x8 with cues for trunk mechanics Seated march 3x8 BIL LE   Neuromuscular re-ed: Stepping over 3 therabands as external cues, leading with different leg each trial - 2x3 laps cues for increased step length, tactile cues to maintain LOS within BOS  Fwd/retro walking along counter 4 laps cues for step length and posture, pacing Gait 2x163ft cues for stability, pacing and step amplitude    [facility] Adult PT Treatment:                                                DATE: 06/29/2023 Therapeutic Exercise: Sit to stand 2 x 10  Seated marching on table 2 x 15 alternating L/R Standing hip abduction 2 x 15 with YTB with bil HHA on chair for stability.   Provided HEP for sit to stand, seated marching, and standing hip abduction  Neuromuscular re-ed: Static wide BOS balance 2 x 30 seconds eyes open, 2 x 30 sec eyes closed  Romberg balance 2 x 30 seconds eyes open, 2 x 30 seconds eyes closed Gait training using "big steps" and intentional movement when walking x 150 ft with gait belt and frequent verbal cues.   Therapeutic Activity: Rocking forward/ backward and reviewed mechanics of sitting and standing.   [facility]  Adult PT Treatment:                                                DATE: 06/16/23 Deferred in favor of increased time with education, discussion, and balance assessment  PATIENT EDUCATION:  Education details: rationale for interventions, HEP  Person educated: Patient Education method: Explanation, Demonstration,  Tactile cues, Verbal cues Education comprehension: verbalized understanding, returned demonstration, verbal cues required, tactile cues required, and needs further education    HOME EXERCISE PROGRAM: Access Code: [email or web address] URL: [email or web address] Date: 07/07/2023 Prepared by: Fransisco Hertz  Program Notes Remember with all these exercises to perform with almost exaggerated movements.  Exercises - Sit to Stand  - 1 x daily - 7 x weekly - 2 sets - 10 reps - Standing Hip Abduction with Resistance at Ankles and Counter Support  - 1 x daily - 7 x weekly - 2 sets - 10 reps - Standing March with Counter Support  - 1 x daily - 7 x weekly - 2 sets - 8 reps  ASSESSMENT:  CLINICAL IMPRESSION: 07/20/2023 Pt arrives w/o pain.  Pt appeared unsteady in gait and given gait belt and HHAx 1 to ambulate to Nu step to warm up and then  llbars for exercise. Session continued with gait and balance with focus on stepping over obstacles to increase step length and clearing obstacles in llbars. Pt has one rest break during session. Pt reported feeling fatigued at end of session  but continued to have longer stride length with verbal cues.   EVALUATION: Patient is a pleasant 77 y.o. woman who was seen today for physical therapy evaluation and treatment for gait instability ongoing for last few years. Accompanied by her granddaughter with her consent. On exam pt demonstrates altered gait kinematics and transfer mechanics, mild generalized weakness, impaired coordination, and stiffness throughout BUE/trunk. Pt has known history of parkinsonism per review of neurology notes (most recent MRI 05/31/23, refer to [facility]), and in clinic today she does demonstrate shuffling gait pattern, generalized bradykinesia, reduced motor initiation, and incoordination. Given this, discussion w/ pt and family about possibility of transferring to our neuro clinic - they state they would prefer to continue treatment at our clinic  for now, which is respected. Deferring HEP today given increased time with balance assessment pt discussion/education. No adverse events, tolerates session well overall. Close SBA-CGA provided for safety with mobility within clinic today. Recommend skilled PT to address aforementioned deficits and improve functional tolerance, reduce fall risk. Pt departs today's session in no acute distress, all voiced questions/concerns addressed appropriately from PT perspective.     OBJECTIVE IMPAIRMENTS: Abnormal gait, decreased activity tolerance, decreased balance, decreased coordination, decreased endurance, decreased mobility, difficulty walking, decreased ROM, decreased strength, impaired flexibility, improper body mechanics, postural dysfunction, and pain.   ACTIVITY LIMITATIONS: carrying, lifting, bending, standing, squatting, stairs, transfers, bathing, dressing, reach over head, hygiene/grooming, and locomotion level  PARTICIPATION LIMITATIONS: meal prep, cleaning, laundry, driving, shopping, and community activity  PERSONAL FACTORS: Age, Time since onset of injury/illness/exacerbation, and 3+ comorbidities: HTN, DM, osteoporosis, depression, parkinsonism  are also affecting patient's functional outcome.   REHAB POTENTIAL: Fair given chronicity  CLINICAL DECISION MAKING: Evolving/moderate complexity  EVALUATION COMPLEXITY: Moderate   GOALS: Goals reviewed with patient? No although did review role of PT, POC  SHORT TERM GOALS: Target date: 07/14/2023 Pt will demonstrate appropriate understanding  and performance of initially prescribed HEP in order to facilitate improved independence with management of symptoms.  Baseline: HEP TBD 07/07/23: pt endorses daily performance of HEP Goal status: MET  2. Pt will be able to perform five consecutive sit to stands with upper extremity support and ability to achieve full upright in order to improve transfer mechanics.  Baseline: heavy UE support, unable to  achieve full upright  07/07/23: able to perform blocked practice 4x5 STS with UE support, achieving full upright  Goal status: MET  LONG TERM GOALS: Target date: 08/11/2023 1.  Pt will demonstrate global improvement of at least 1 pt in LE MMT in order to facilitate improved functional strength.  Baseline: see MMT chart above  Goal status: INITIAL   2.  Pt will perform Dynamic Gait Index with score greater than or equal to 16/24 in order to indicate reduced fall risk (<19/24 predictive of falls in elderly per [facility] et al 1995, MCID 1.9 per Pardasaney et al 2012)  Baseline: 11/24  Goal status: INITIAL    3. Pt will perform 5xSTS in <15 sec with UE support and achieving full upright in order to demonstrate reduced fall risk and improved functional independence. (MCID of 2.3sec)  Baseline: 18sec with altered kinematics, unable to achieve full upright, heavy UE support  Goal status: INITIAL   4. Pt will demonstrate appropriate performance of final prescribed HEP in order to facilitate improved self-management of symptoms post-discharge.   Baseline: HEP TBD  Goal status: INITIAL    PLAN:  PT FREQUENCY: 1-2x/week  PT DURATION: 8 weeks  PLANNED INTERVENTIONS: Therapeutic exercises, Therapeutic activity, Neuromuscular re-education, Balance training, Gait training, Patient/Family education, Self Care, Joint mobilization, Stair training, Vestibular training, Cryotherapy, Moist heat, Taping, Manual therapy, and Re-evaluation.  PLAN FOR NEXT SESSION: update/review HEP PRN. Continue working on postural stability, increased amplitude of movements.     Garen Lah, PT, ATRIC Certified Exercise Expert for the Aging Adult  07/20/23 9:30 AM Phone: [phone] Fax: [phone]

## 2023-07-20 ENCOUNTER — Encounter: Payer: Self-pay | Admitting: Physical Therapy

## 2023-07-20 ENCOUNTER — Ambulatory Visit: Payer: Medicare Other | Admitting: Physical Therapy

## 2023-07-20 DIAGNOSIS — M6281 Muscle weakness (generalized): Secondary | ICD-10-CM | POA: Diagnosis not present

## 2023-07-20 DIAGNOSIS — R2689 Other abnormalities of gait and mobility: Secondary | ICD-10-CM

## 2023-07-22 ENCOUNTER — Ambulatory Visit: Payer: Medicare Other | Admitting: Physical Therapy

## 2023-07-22 ENCOUNTER — Encounter: Payer: Self-pay | Admitting: Physical Therapy

## 2023-07-22 DIAGNOSIS — M6281 Muscle weakness (generalized): Secondary | ICD-10-CM

## 2023-07-22 DIAGNOSIS — R2689 Other abnormalities of gait and mobility: Secondary | ICD-10-CM

## 2023-07-22 NOTE — Therapy (Signed)
OUTPATIENT PHYSICAL THERAPY TREATMENT   Patient Name: Brandi Walsh MRN: [id number] DOB:[birth year], 77 y.o., female Today's Date: 07/22/2023  END OF SESSION:  PT End of Session - 07/22/23 0850     Visit Number 8    Number of Visits 17    Date for PT Re-Evaluation 08/11/23    Authorization Type [employer] medicare    Progress Note Due on Visit 10    PT Start Time 0850    PT Stop Time 0930    PT Time Calculation (min) 40 min                 Past Medical History:  Diagnosis Date   Abnormal liver enzymes 09/19/2019   Blurred vision 09/19/2019   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   Conjunctivitis of both eyes 05/27/2023   Dementia likely due to Alzheimer's disease 07/06/2023   Dysuria 05/27/2023   Essential hypertension 10/13/2007   GERD (gastroesophageal reflux disease) 02/19/2012   Hypercalcemia 03/15/2019   Hyperlipidemia 11/11/2020   Localized edema 01/22/2017   Major depressive disorder 11/24/2018   Melena 05/06/2023   Menopausal syndrome 10/13/2007   Osteoporosis 10/13/2007   Polycythemia 12/03/2010   Primary hyperparathyroidism 06/12/2013   Type II diabetes mellitus 10/13/2007   Past Surgical History:  Procedure Laterality Date   ABDOMINAL HYSTERECTOMY     BIOPSY  05/08/2023   Procedure: BIOPSY;  Surgeon: Lynann Bologna, MD;  Location: [facility];  Service: Gastroenterology;;   BREAST BIOPSY  08/2002   ESOPHAGOGASTRODUODENOSCOPY N/A 05/08/2023   Procedure: ESOPHAGOGASTRODUODENOSCOPY (EGD);  Surgeon: Lynann Bologna, MD;  Location: [facility];  Service: Gastroenterology;  Laterality: N/A;   TONSILLECTOMY     Patient Active Problem List   Diagnosis Date Noted   Hematuria 07/09/2023   Dementia likely due to Alzheimer's disease 07/06/2023   Dysuria 05/27/2023   Hyperlipidemia 11/11/2020   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   Abnormal liver enzymes 09/19/2019   Blurred vision 09/19/2019   Hypercalcemia 03/15/2019   Major depressive disorder 11/24/2018   Primary hyperparathyroidism  06/12/2013   GERD (gastroesophageal reflux disease) 02/19/2012   Polycythemia 12/03/2010   Type II diabetes mellitus 10/13/2007   Essential hypertension 10/13/2007   Osteoporosis 10/13/2007   Menopausal syndrome 10/13/2007    PCP: Elenore Paddy, NP  REFERRING PROVIDER: [name], PA-C  REFERRING DIAG: [phone] (ICD-10-CM) - Gait instability  Rationale for Evaluation and Treatment: Rehabilitation  THERAPY DIAG:  Muscle weakness (generalized)  Other abnormalities of gait and mobility  ONSET DATE: at least 5 years, more notable over past few months  SUBJECTIVE:  SUBJECTIVE STATEMENT: 07/22/2023 No pain on arrival.    PERTINENT HISTORY:  HTN, GERD, DM, osteoporosis, parkinsonism (per neurology notes)  PAIN:  Denies any significant pain affecting mobility  PRECAUTIONS: fall risk, osteoporosis   WEIGHT BEARING RESTRICTIONS: No  FALLS:  Has patient fallen in last 6 months? No  LIVING ENVIRONMENT: 2 story home with husband, her granddaughter lives with her on main floor, has help entering a couple steps. Granddaughter helps with housework, she also works full time - has 77 year old and 77 year old No AD but does get assistance with ambulation from family Has grab bars in bathroom, shower chair. Has tub shower, does sponge baths  OCCUPATION: retired - used to work with [employer]. Enjoys reading, watching TV (wheel of fortune, jeopardy)  PLOF: has help from family for dressing and bathing; has needed assistance for ~5 years  PATIENT GOALS: be more independent, get out into community more   NEXT MD VISIT: July 23 2023  OBJECTIVE: (objective measures completed at initial evaluation unless otherwise dated)   DIAGNOSTIC FINDINGS:  05/31/23 Brain MRI "IMPRESSION: 1. No evidence of an acute  intracranial abnormality. 2. Mild chronic small vessel ischemic changes within the cerebral white matter, similar to the prior brain MRI of 07/22/2020. 3. There are a few chronic microhemorrhages within the supratentorial brain, as described and increased in number from the prior MRI. 4. Mild generalized cerebral atrophy."  PATIENT SURVEYS:  FOTO deferred given time constraints and history of dementia  COGNITION: Overall cognitive status: conversationally appropriate for today's visit, some increased processing time noted. Hx of cognitive impairments/dementia per chart review     SENSATION/NEURO: Light touch intact all extremities, no extinction   Finger<>nose testing bradykinetic and mild tremors L greater than R No overt ataxia with gait, shuffling pattern, bradykinetic movements and reduced initiation with motor tasks   POSTURE: tendency to hold R UE in elbow flexion, kyphotic and forward head posture   RANGE OF MOTION:     Active  Right eval Left eval  Shoulder flexion ~90 deg ~90 deg  Functional ER combo occiput occiput  Functional IR combo    Knee extension    Ankle dorsiflexion At least neutral Limited on L compared to R   (Blank rows = not tested) (Key: WFL = within functional limits not formally assessed, * = concordant pain, s = stiffness/stretching sensation, NT = not tested)  Comments: limited by stiffness   STRENGTH TESTING:  MMT Right eval Left eval  Shoulder flexion    Shoulder abduction    Elbow flexion    Elbow extension    Grip strength (gross)    Hip flexion 4 4  Hip abduction (modified sitting) 3+ 3+  Knee flexion 4 4  Knee extension 4- 4-  Ankle dorsiflexion 4+ 4+  Ankle plantarflexion     (Blank rows = not tested) (Key: WFL = within functional limits not formally assessed, * = concordant pain, s = stiffness/stretching sensation, NT = not tested)  Comments:     FUNCTIONAL TESTS:  5xSTS: 18 sec with heavy UE support, unable to achieve  full upright   Dynamic Gait Index:   1. Gait Level Surface;  1  2. Change in Gait Speed; 2  3. Gait With Horizontal Head Turns; 2  4. Gait With Vertical Head Turns; 2  5. Gait and Pivot Turn; 1  6. Step Over Obstacle; 1  7. Step Around Obstacles; 1  8. Steps;  1 (per report)   Grading scale:  3 No impairment; 2 Mild impairment/AD use; 1 Moderate impairment; 0 Severe impairment or unable to perform   Total score: 11/24  Cutoff score for fall risk: </=19/24   GAIT: Distance walked: within clinic Assistive device utilized: None Level of assistance: Complete Independence Comments: shuffling pattern, reduced initiation, forward flexed posture, reduced trunk rotation and arm swing (more affected on R)  TODAY'S TREATMENT:  [facility] Adult PT Treatment:                                                DATE: 07/22/23 Therapeutic Exercise: Nustep L 4 x 5 min 3 way hip standing yellow band at ankles  Heel toe raises Gastroc stretch in // bars  Step stretch for hip flexor/calf 6 inch step ups with UE support x 8 each   Therapeutic Activity: Stepping over obstacles -CGA Side stepping without UE- SBA Gait around obstacles x 4 - CGA Gait with high knees -CGA Stair negotiation - SBA/mod i Tandem stance on AIREX trials- intermittent touch 7-10 sec  Gait throughout clinic with CGA and cues for increased step length   [facility] Adult PT Treatment:                                                DATE: 07-20-23 Therapeutic Exercise: Nustep L4 UE/LE x 5 minutes  Standing March 10 x 2  Walking march 2 lengths of llbars 15 feet Heel/toe raises 2 x 15 Standing hip abduction 10 x 2  hold 3 sec Sidestepping 2 lengths  of llbars  Sit to stand with PT CGA x 1 with pt holding 8 lb DB x 10 Gastro stretch x 2 each, in llbars PT assist  Neuromuscular re-ed: Narrow stance on AIREX- added head turns and nods Semi tandem On AIREX- added head turns and nods Therapeutic Activity: Stepping over obstacles (yoga  blocks) step to pattern and alternating pattern without UE assist, 4 obstacles x 4  Side stepping over obstacles x 4  obstacles each way, 1 UE Gait in //bars with cues or step length and heel strike with and without UE                                                                                                                              [facility] Adult PT Treatment:                                                DATE: 07/15/23 Therapeutic Exercise: Nustep L4 UE/LE x 5 minutes  Standing hip abduction 10 x 2  Standing March 10 x 2  Heel/toe raises Gastro stretch x 2 each, at counter   Neuromuscular re-ed: Narrow stance on AIREX- added head turns and nods Semi tandem On AIREX- added head turns and nods Therapeutic Activity: Stepping over obstacles (yoga blocks) step to pattern and alternating pattern without UE assist, 4 obstacles x 4  Side stepping over obstacles x 4  obstacles each way, 1 UE Gait in //bars with cues or step length and heel strike with and without UE Side stepping in parallel bars without UE, cues for step length     [facility] Adult PT Treatment:                                                DATE: 07/09/23 Therapeutic Exercise: Nustep L3 UE/LE x 5 minutes  LAQ 4# alternating x 15 each  STS x 6 without UE Neuromuscular re-ed: Narrow stance on AIREX- added head turns and nods Semi tandem On AIREX- added head turns and nods Therapeutic Activity: Side stepping in // bars 2 lengths Gait with cues for step length  Retro walking in // bars cues for step length- 2 lengths Tandem gait intermittent UE touch as needed - 2 lengths Stepping over obstacles (yoga blocks) step to pattern and alternating pattern with 1 UE assist, 4 obstacles x 4  Side stepping over obstacles x 4  obstacles each way   [facility] Adult PT Treatment:                                                DATE: 07/07/23 Neuromuscular re-ed: Partial tandem UE punches 2x8 BIL - cues for truncal rotation, posture, and  sequencing; for improved truncal dissociation 6x52ft ambulation with cues to increase amplitude of movement (takes 15 steps for first bout without cueing, cues to return in less steps); subsequent bouts in 13 steps, 12 steps, 12 steps, 11 steps, 10 steps; seated rest every 2 bouts HEP balance update - emphasis on performing safely at home with counter support Therapeutic Activity: Blocked practice STS 4x5 cues for mechanics, sequencing, and UE support  Standing march at counter with weaning UE support 2x8 BIL, for improved weight shifting, cues for posture and stability     [facility] Adult PT Treatment:                                                DATE: 07/01/23 Therapeutic Exercise: STS with fwd reach x8 cues for trunk mechanics and pacing STS w fwd reach x8 with cues for improved BOS and start position STS w fwd/OH reach x8 with cues for trunk mechanics Seated march 3x8 BIL LE   Neuromuscular re-ed: Stepping over 3 therabands as external cues, leading with different leg each trial - 2x3 laps cues for increased step length, tactile cues to maintain LOS within BOS  Fwd/retro walking along counter 4 laps cues for step length and posture, pacing Gait 2x15ft cues for stability, pacing and step amplitude       [facility] Adult PT Treatment:  DATE: 06/16/23 Deferred in favor of increased time with education, discussion, and balance assessment  PATIENT EDUCATION:  Education details: rationale for interventions, HEP  Person educated: Patient Education method: Explanation, Demonstration, Tactile cues, Verbal cues Education comprehension: verbalized understanding, returned demonstration, verbal cues required, tactile cues required, and needs further education    HOME EXERCISE PROGRAM: Access Code: [email or web address] URL: [email or web address] Date: 07/07/2023 Prepared by: Fransisco Hertz  Program Notes Remember with all these exercises to perform  with almost exaggerated movements.  Exercises - Sit to Stand  - 1 x daily - 7 x weekly - 2 sets - 10 reps - Standing Hip Abduction with Resistance at Ankles and Counter Support  - 1 x daily - 7 x weekly - 2 sets - 10 reps - Standing March with Counter Support  - 1 x daily - 7 x weekly - 2 sets - 8 reps  ASSESSMENT:  CLINICAL IMPRESSION: 07/22/2023 Pt arrives w/o pain. She is able to negotiate stairs with bilateral handrails without assistance. Used steps to stretch gastroc/ hip flexors. Improved static stance, able to hold Tandem on AIREX 7-10 sec without UE. She steps over obstacles and negotiates around obstacles with SBA. With verbal cues, she can increase her step length. Continued with Hip mobility and strengthening with pt requiring 4 rest breaks due to session being mostly closed chain activity. Will plan to check progress toward LTGs next visit.     EVALUATION: Patient is a pleasant 77 y.o. woman who was seen today for physical therapy evaluation and treatment for gait instability ongoing for last few years. Accompanied by her granddaughter with her consent. On exam pt demonstrates altered gait kinematics and transfer mechanics, mild generalized weakness, impaired coordination, and stiffness throughout BUE/trunk. Pt has known history of parkinsonism per review of neurology notes (most recent MRI 05/31/23, refer to [facility]), and in clinic today she does demonstrate shuffling gait pattern, generalized bradykinesia, reduced motor initiation, and incoordination. Given this, discussion w/ pt and family about possibility of transferring to our neuro clinic - they state they would prefer to continue treatment at our clinic for now, which is respected. Deferring HEP today given increased time with balance assessment pt discussion/education. No adverse events, tolerates session well overall. Close SBA-CGA provided for safety with mobility within clinic today. Recommend skilled PT to address aforementioned  deficits and improve functional tolerance, reduce fall risk. Pt departs today's session in no acute distress, all voiced questions/concerns addressed appropriately from PT perspective.     OBJECTIVE IMPAIRMENTS: Abnormal gait, decreased activity tolerance, decreased balance, decreased coordination, decreased endurance, decreased mobility, difficulty walking, decreased ROM, decreased strength, impaired flexibility, improper body mechanics, postural dysfunction, and pain.   ACTIVITY LIMITATIONS: carrying, lifting, bending, standing, squatting, stairs, transfers, bathing, dressing, reach over head, hygiene/grooming, and locomotion level  PARTICIPATION LIMITATIONS: meal prep, cleaning, laundry, driving, shopping, and community activity  PERSONAL FACTORS: Age, Time since onset of injury/illness/exacerbation, and 3+ comorbidities: HTN, DM, osteoporosis, depression, parkinsonism  are also affecting patient's functional outcome.   REHAB POTENTIAL: Fair given chronicity  CLINICAL DECISION MAKING: Evolving/moderate complexity  EVALUATION COMPLEXITY: Moderate   GOALS: Goals reviewed with patient? No although did review role of PT, POC  SHORT TERM GOALS: Target date: 07/14/2023 Pt will demonstrate appropriate understanding and performance of initially prescribed HEP in order to facilitate improved independence with management of symptoms.  Baseline: HEP TBD 07/07/23: pt endorses daily performance of HEP Goal status: MET  2. Pt will be able to perform five consecutive sit to stands  with upper extremity support and ability to achieve full upright in order to improve transfer mechanics.  Baseline: heavy UE support, unable to achieve full upright  07/07/23: able to perform blocked practice 4x5 STS with UE support, achieving full upright  Goal status: MET  LONG TERM GOALS: Target date: 08/11/2023 1.  Pt will demonstrate global improvement of at least 1 pt in LE MMT in order to facilitate improved  functional strength.  Baseline: see MMT chart above  Goal status: INITIAL   2.  Pt will perform Dynamic Gait Index with score greater than or equal to 16/24 in order to indicate reduced fall risk (<19/24 predictive of falls in elderly per [facility] et al 1995, MCID 1.9 per Pardasaney et al 2012)  Baseline: 11/24  Goal status: INITIAL    3. Pt will perform 5xSTS in <15 sec with UE support and achieving full upright in order to demonstrate reduced fall risk and improved functional independence. (MCID of 2.3sec)  Baseline: 18sec with altered kinematics, unable to achieve full upright, heavy UE support  Goal status: INITIAL   4. Pt will demonstrate appropriate performance of final prescribed HEP in order to facilitate improved self-management of symptoms post-discharge.   Baseline: HEP TBD  Goal status: INITIAL    PLAN:  PT FREQUENCY: 1-2x/week  PT DURATION: 8 weeks  PLANNED INTERVENTIONS: Therapeutic exercises, Therapeutic activity, Neuromuscular re-education, Balance training, Gait training, Patient/Family education, Self Care, Joint mobilization, Stair training, Vestibular training, Cryotherapy, Moist heat, Taping, Manual therapy, and Re-evaluation.  PLAN FOR NEXT SESSION: update/review HEP PRN. Continue working on postural stability, increased amplitude of movements.     Jannette Spanner, PTA 07/22/23 12:34 PM Phone: [phone] Fax: [phone]

## 2023-07-23 ENCOUNTER — Encounter: Payer: Self-pay | Admitting: Neurology

## 2023-07-23 ENCOUNTER — Ambulatory Visit: Payer: Medicare Other | Admitting: Neurology

## 2023-07-23 VITALS — BP 127/70 | HR 77 | Ht 60.0 in | Wt 154.8 lb

## 2023-07-23 DIAGNOSIS — G309 Alzheimer's disease, unspecified: Secondary | ICD-10-CM | POA: Diagnosis not present

## 2023-07-23 DIAGNOSIS — F0280 Dementia in other diseases classified elsewhere without behavioral disturbance: Secondary | ICD-10-CM | POA: Diagnosis not present

## 2023-07-23 MED ORDER — DONEPEZIL HCL 10 MG PO TABS: 10.0000 mg | ORAL_TABLET | Freq: Every day | ORAL | 3 refills | Status: DC

## 2023-07-23 NOTE — Patient Instructions (Signed)
Good to see you.  Increase Donepezil to 10mg  daily  2. Follow-up in 6 months with Marlowe Kays, PA-C, then with me in 1 year   FALL PRECAUTIONS: Be cautious when walking. Scan the area for obstacles that may increase the risk of trips and falls. When getting up in the mornings, sit up at the edge of the bed for a few minutes before getting out of bed. Consider elevating the bed at the head end to avoid drop of blood pressure when getting up. Walk always in a well-lit room (use night lights in the walls). Avoid area rugs or power cords from appliances in the middle of the walkways. Use a walker or a cane if necessary and consider physical therapy for balance exercise. Get your eyesight checked regularly.   HOME SAFETY: Consider the safety of the kitchen when operating appliances like stoves, microwave oven, and blender. Consider having supervision and share cooking responsibilities until no longer able to participate in those. Accidents with firearms and other hazards in the house should be identified and addressed as well.   ABILITY TO BE LEFT ALONE: If patient is unable to contact 911 operator, consider using LifeLine, or when the need is there, arrange for someone to stay with patients. Smoking is a fire hazard, consider supervision or cessation. Risk of wandering should be assessed by caregiver and if detected at any point, supervision and safe proof recommendations should be instituted.  RECOMMENDATIONS FOR ALL PATIENTS WITH MEMORY PROBLEMS: 1. Continue to exercise (Recommend 30 minutes of walking everyday, or 3 hours every week) 2. Increase social interactions - continue going to [place] and enjoy social gatherings with friends and family 3. Eat healthy, avoid fried foods and eat more fruits and vegetables 4. Maintain adequate blood pressure, blood sugar, and blood cholesterol level. Reducing the risk of stroke and cardiovascular disease also helps promoting better memory. 5. Avoid stressful  situations. Live a simple life and avoid aggravations. Organize your time and prepare for the next day in anticipation. 6. Sleep well, avoid any interruptions of sleep and avoid any distractions in the bedroom that may interfere with adequate sleep quality 7. Avoid sugar, avoid sweets as there is a strong link between excessive sugar intake, diabetes, and cognitive impairment The Mediterranean diet has been shown to help patients reduce the risk of progressive memory disorders and reduces cardiovascular risk. This includes eating fish, eat fruits and green leafy vegetables, nuts like almonds and hazelnuts, walnuts, and also use olive oil. Avoid fast foods and fried foods as much as possible. Avoid sweets and sugar as sugar use has been linked to worsening of memory function.  There is always a concern of gradual progression of memory problems. If this is the case, then we may need to adjust level of care according to patient needs. Support, both to the patient and caregiver, should then be put into place.

## 2023-07-23 NOTE — Progress Notes (Signed)
NEUROLOGY FOLLOW UP OFFICE NOTE  Brandi Walsh [id number] October 29, 1976  HISTORY OF PRESENT ILLNESS: I had the pleasure of seeing Brandi Walsh in follow-up in the neurology clinic on 07/23/2023.  She is again accompanied by her granddaughter Brandi Walsh who helps supplement the history today. The patient was last seen by Memory Disorders PA Marlowe Kays 2 months ago for memory loss. Since then, she underwent repeat Neuropsychological testing with Dr. 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 I personally reviewed brain MRI without contrast which did not show any acute changes. There is mild diffuse atrophy, mild chronic microvascular disease, chronic microhemorrhages in the right hippocampus and medial temporo-occipital lobes (new from MRI done 2021), punctate chronic microhemorrhage in the right occipital lobe.   She feels her memory is pretty good, "sometimes I have a little slack in it." She lives with her husband, Brandi Walsh, and her 2 great grandchildren. Brandi Walsh reports family manages her medications, finances, meals. She does not drive. She needs assistance with dressing and bathing. She wears Depends because she does not get to bathroom fast enough sometimes. Mood is good, no hallucinations or paranoia. Sleeping is okay. No tremors. She endorses occasional problem swallowing, choking a little. She is on Donepezil 5mg  daily. She had a couple of loose stools earlier in the week but this has gotten better. Brandi Walsh reports that around 3 months ago, she was having episodes that occur when getting ready for her shower, after coming from the commode she would get weak (diffuse) and diaphoretic, her husband had to guide her to the floor on 2-3 occasions. They would check vital signs after which would be fine. She has become scared to shower so husband would just give her a sponge bath standing up and she has not had any further spells. She  had a GI bleed in June which is better. She is still doing physical therapy and walks with her husband. She denies any headaches, dizziness, focal numbness/tingling/weakness.    History on Initial Assessment 06/25/2020: This is a 77 year old left-handed woman with a history of hypertension, hyperlipidemia, depression, anxiety, presenting for evaluation of recently diagnosed dementia on Neuropsychological testing done last 01/2020. Records reviewed. She reports her memory may not be so great. Her granddaughter Brandi Walsh provides additional information. Brandi Walsh reports an incident in December 2018 when she had visual and auditory hallucinations and was admitted to [facility] and started on psychiatric medication. She had Neuropsychological testing in April 2018 with Dr. 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 Cognitive testing indicated possible very mild global attenuation of cognitive function relative to estimated premorbid intellectual abilities, with the only area of true impairment being phonemic verbal fluency and memory retrieval afor newly learned auditory information, probable mild frontal-subcortical dysfunction. Thought process was still mildly disorganized, unclear if longstanding and/or related to primary psychiatric disorder. She was no longer having delusions at that time. Brandi Walsh reports that since then, there has been a steady decline with her memory. Psychiatric disorder had been stable for 2 years with adjustment in Lexapro and Olanzapine. Over the past 6 months, Brandi Walsh noticed a biggest turning point in her cognition. She also noticed slower movements. She is overall more anxious. Her psychiatrist has advised monitoring for tardive dyskinesia, her lower jaw movements have progressively worsened. Brandi Walsh was not sure about medication adherence, so she has been helping the last few  weeks. She  noted waxing and waning symptoms and requested repeat Neuropsychological testing. Records from Dr. 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 Cholinesterase inhibitor was recommended, she was started on Donepezil 5mg  daily by her Psychiatrist, no side effects.   She lives with her husband. She states her memory "may not be so great." Brandi Walsh reports that she has always been very sharp, so the slightest things she does not remember would bother her. She denies getting lost driving but drives rarely, around once a month. She and her husband do bills together, there may be some missed bills. Her husband manages meals. She is slow to answer questions but answers appropriately and thoughtfully. She has some dizziness in the morning and endorses vertigo when lying flat. She has some blurred vision. She has had some pain between her shoulder pain for a few weeks. She reports numbness in both calves. She has had bilateral leg swelling even before Donepezil was started. She has occasional constipation. She has occasional hand tremors when anxious. No headaches, dysarthria/dysphagia, focal weakness, anosmia. Sleep is okay. 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 She reports mood is good. Brandi Walsh notes she worries/stresses too much. Everyday is a little different with how she feels, how quickly she is moving. Sometimes she is a bit more agitated and does not want to eat, saying her partials don't fit well or her things get stuck (she is s/p esophageal dilatation). Brandi Walsh notes she has always been thoughtful but not as slow. She has always been very precise and detail-oriented, which is not changed, but she is not usually as slow. No  visual/auditory hallucinations. She is independent with dressing and bathing but gets stuck sometimes putting on clothes. If she gets stuck in a thought, she freezes and cannot move. No recent changes in psychiatric medication, a year ago Lexapro was increased to 10mg  to increase motivation, which has not helped much. Brandi Walsh notes there was more lucidity to her surroundings at that time, which is not present now. No family history of dementia, no history of concussions or alcohol use.  Diagnostic Data: MRI brain with and without contrast done 06/2020 did not show any acute changes. There was mild diffuse atrophy, bilateral hippocampal atrophy, mild chronic microvascular disease.   Repeat Neuropsychological evaluation 07/2023: Major Neurocognitive Disorder with severe impairment surrounding processing speed, cognitive flexibility, confrontation naming, clock drawing, and delayed retrieval aspects of memory. Additional weaknesses were exhibited across complex attention, verbal fluency, and recognition/consolidation aspects of memory. Relative to prior testing, despite differing instrumentation, Ms. Tiu has exhibited quite prominent and diffuse cognitive decline relative to her previous evaluation in 2018. While the report associated with her 2021 evaluation did not provide any test scores, the description of her functioning does seem to align with what testing captured across the current evaluation. I share Dr. McDermott's prior concerns surrounding the presence of Alzheimer's disease. Despite being able to initially learn information well, she was fully amnestic (i.e., 0% retention) across 2/3 memory tasks and only exhibited 14% retention across her remaining memory test. She also exhibited overall poor performance across yes/no recognition trials. Taken together, this suggests rapid forgetting and an evolving and already quite significant storage impairment, both of which are the hallmark testing  characteristics of this illness. Further impairment/weakness in confrontation naming  and semantic fluency unfortunately represent traditional disease progression. She additionally exhibited bilateral hippocampal atrophy of moderate severity on a brain MRI back in 2021, which is a well-established risk factor for this illness.    PAST MEDICAL HISTORY: Past Medical History:  Diagnosis Date   Abnormal liver enzymes 09/19/2019   Blurred vision 09/19/2019   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   Conjunctivitis of both eyes 05/27/2023   Dementia likely due to Alzheimer's disease 07/06/2023   Dysuria 05/27/2023   Essential hypertension 10/13/2007   GERD (gastroesophageal reflux disease) 02/19/2012   Hypercalcemia 03/15/2019   Hyperlipidemia 11/11/2020   Localized edema 01/22/2017   Major depressive disorder 11/24/2018   Melena 05/06/2023   Menopausal syndrome 10/13/2007   Osteoporosis 10/13/2007   Polycythemia 12/03/2010   Primary hyperparathyroidism 06/12/2013   Type II diabetes mellitus 10/13/2007    MEDICATIONS: Current Outpatient Medications on File Prior to Visit  Medication Sig Dispense Refill   alendronate (FOSAMAX) 70 MG tablet Take 70 mg by mouth once a week.     Cholecalciferol (VITAMIN D) 50 MCG (2000 UT) tablet Take 2,000 Units by mouth daily.     donepezil (ARICEPT) 5 MG tablet Take 5 mg by mouth daily.     escitalopram (LEXAPRO) 10 MG tablet TAKE 1 & 1/2 (ONE & ONE-HALF) TABLETS BY MOUTH ONCE DAILY (Patient taking differently: Take 15 mg by mouth daily.) 135 tablet 3   losartan (COZAAR) 50 MG tablet Take 1 tablet (50 mg total) by mouth daily. 90 tablet 3   mupirocin cream (BACTROBAN) 2 % Apply 1 Application topically 2 (two) times daily. 15 g 0   pantoprazole (PROTONIX) 40 MG tablet Take 1 tablet (40 mg total) by mouth 2 (two) times daily. 120 tablet 0   rosuvastatin (CRESTOR) 40 MG tablet Take 1 tablet (40 mg total) by mouth daily. 90 tablet 3   No current facility-administered  medications on file prior to visit.    ALLERGIES: Allergies  Allergen Reactions   Lovastatin     REACTION: Rash    FAMILY HISTORY: Family History  Problem Relation Age of Onset   Anxiety disorder Mother    Hyperparathyroidism Sister    Cancer Neg Hx    Breast cancer Neg Hx     SOCIAL HISTORY: Social History   Socioeconomic History   Marital status: Married    Spouse name: Not on file   Number of children: 3   Years of education: 14   Highest education level: Some college, no degree  Occupational History   Occupation: Retired   Tobacco Use   Smoking status: Never   Smokeless tobacco: Never  Vaping Use   Vaping status: Never Used  Substance and Sexual Activity   Alcohol use: No   Drug use: No   Sexual activity: Yes  Other Topics Concern   Not on file  Social History Narrative   Left Handed   Two Story Home   Lives with Husband    Drinks coffee and Diet Pepsi sometimes   retired   International aid/development worker of Corporate investment banker Strain: Not on file  Food Insecurity: No Food Insecurity (05/06/2023)   Hunger Vital Sign    Worried About Running Out of Food in the Last Year: Never true    Ran Out of Food in the Last Year: Never true  Transportation Needs: No Transportation Needs (05/06/2023)   PRAPARE - Administrator, Civil Service (Medical): No    Lack of Transportation (Non-Medical): No  Physical  Activity: Not on file  Stress: Not on file  Social Connections: Not on file  Intimate Partner Violence: Patient Unable To Answer (05/06/2023)   Humiliation, Afraid, Rape, and Kick questionnaire    Fear of Current or Ex-Partner: Patient unable to answer    Emotionally Abused: Patient unable to answer    Physically Abused: Patient unable to answer    Sexually Abused: Patient unable to answer     PHYSICAL EXAM: Vitals:   07/23/23 1323  BP: 127/70  Pulse: 77  SpO2: 100%   General: No acute distress Head:   Normocephalic/atraumatic Skin/Extremities: No rash, no edema Neurological Exam: alert and awake. No aphasia or dysarthria. Fund of knowledge is appropriate.  Attention and concentration are normal.   Cranial nerves: Pupils equal, round. Extraocular movements intact with no nystagmus. Visual fields full.  No facial asymmetry.  Motor: Bulk and tone increased, muscle strength 5/5 throughout with no pronator drift.   Finger to nose testing intact.  Gait slow and cautious with small steps, they report she is always stiff when standing first. No tremor.   IMPRESSION: This is a 77 yo LH woman with a history of hypertension, hyperlipidemia, depression, anxiety, with recent Neuropsychological evaluation indicating Major Neurocognitive Disorder, likely Alzheimer's disease. Repeat brain MRI again showed bilateral hippocampal atrophy. Increase Donepezil to 10mg  daily, monitor for GI side effects. We discussed the brain MRI and family's concern about microhemorrhages, discussed these would not cause the episodes of weakness which are suggestive of vasovagal events. She is again noted to be bradykinetic with some rigidity, no tremor, continue physical therapy. Continue close supervision. Follow-up in 6 months with Memory Disorders PA Marlowe Kays, call for any changes.     Thank you for allowing me to participate in her care.  Please do not hesitate to call for any questions or concerns.    Patrcia Dolly, M.D.   CC: Jiles Prows, NP

## 2023-07-28 ENCOUNTER — Encounter: Payer: Self-pay | Admitting: Gastroenterology

## 2023-08-02 ENCOUNTER — Encounter: Payer: Self-pay | Admitting: Certified Registered Nurse Anesthetist

## 2023-08-06 ENCOUNTER — Ambulatory Visit
Admission: RE | Admit: 2023-08-06 | Discharge: 2023-08-06 | Disposition: A | Discharge status: Home / Self Care | Payer: Medicare Other | Source: Ambulatory Visit | Attending: Nurse Practitioner | Admitting: Nurse Practitioner

## 2023-08-06 DIAGNOSIS — Z1239 Encounter for other screening for malignant neoplasm of breast: Secondary | ICD-10-CM

## 2023-08-08 ENCOUNTER — Encounter: Payer: Self-pay | Admitting: Nurse Practitioner

## 2023-08-09 ENCOUNTER — Encounter: Payer: Self-pay | Admitting: Gastroenterology

## 2023-08-09 ENCOUNTER — Ambulatory Visit (AMBULATORY_SURGERY_CENTER): Payer: Medicare Other | Admitting: Gastroenterology

## 2023-08-09 VITALS — BP 126/61 | HR 55 | Temp 98.0°F | Resp 14 | Ht 60.0 in | Wt 154.0 lb

## 2023-08-09 DIAGNOSIS — D509 Iron deficiency anemia, unspecified: Secondary | ICD-10-CM

## 2023-08-09 DIAGNOSIS — K317 Polyp of stomach and duodenum: Secondary | ICD-10-CM

## 2023-08-09 DIAGNOSIS — K259 Gastric ulcer, unspecified as acute or chronic, without hemorrhage or perforation: Secondary | ICD-10-CM

## 2023-08-09 MED ORDER — SODIUM CHLORIDE 0.9 % IV SOLN: 500.0000 mL | INTRAVENOUS | Status: DC

## 2023-08-09 NOTE — Progress Notes (Signed)
GASTROENTEROLOGY PROCEDURE H&P NOTE   Primary Care Physician: Elenore Paddy, NP    Reason for Procedure:  Gastric ulcer follow-up, iron deficiency anemia  Plan:    EGD  Patient is appropriate for endoscopic procedure(s) in the ambulatory ([facility]) setting.  The nature of the procedure, as well as the risks, benefits, and alternatives were carefully and thoroughly reviewed with the patient. Ample time for discussion and questions allowed. The patient understood, was satisfied, and agreed to proceed.     HPI: Brandi Walsh is a 77 y.o. female who presents for EGD for follow-up of gastric ulcers noted on EGD during recent hospital admission for UGI bleed.  Recent hospital admission for UGIB 6/6-05/08/23. Underwent EGD which showed 2 nonbleeding gastric ulcers and gastric polyp. Recent labwork 05/27/23 showed hgb 10.8. BUN 12, Cr. 0.87. repeat EGD recommended in 12 weeks.   Was seen in follow-up in the GI clinic on 06/18/2023.  Was doing well at that time and no recurrence of melena.  Repeat labs on 7/19 with stable H/H at 10.4/33.1.  Normal B12, folate, CMP.  Endoscopic History: - EGD 05/07/23: - 2 cm hiatal hernia. - Non- bleeding gastric ulcers measuring up to 8 mm in the gastric antrum and pylorus with no stigmata of bleeding. Biopsied. - A single 10 mm semipedunculated gastric hyperplastic polyp with erythematous head. Biopsied. - Normal examined duodenum.   -EGD (06/11/2020): Mild Schatzki's ring dilated with 20 mm TTS balloon without mucosal rent.  Empirically dilated remainder of the esophagus with 20 mm TTS balloon inflated and dragged through esophagus without mucosal change.  20 mm hyperplastic polyp in gastric antrum (biopsies benign).  Benign fundic gland polyps. -Resolution of dysphagia since EGD with empiric dilation.  -Patient has since regained her appetite and back to baseline weight   -Colonoscopy in 2007  Past Medical History:  Diagnosis Date   Abnormal liver enzymes  09/19/2019   Blurred vision 09/19/2019   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   Conjunctivitis of both eyes 05/27/2023   Dementia likely due to Alzheimer's disease 07/06/2023   Dysuria 05/27/2023   Essential hypertension 10/13/2007   GERD (gastroesophageal reflux disease) 02/19/2012   Hypercalcemia 03/15/2019   Hyperlipidemia 11/11/2020   Localized edema 01/22/2017   Major depressive disorder 11/24/2018   Melena 05/06/2023   Menopausal syndrome 10/13/2007   Osteoporosis 10/13/2007   Polycythemia 12/03/2010   Primary hyperparathyroidism 06/12/2013   Type II diabetes mellitus 10/13/2007    Past Surgical History:  Procedure Laterality Date   ABDOMINAL HYSTERECTOMY     BIOPSY  05/08/2023   Procedure: BIOPSY;  Surgeon: Lynann Bologna, MD;  Location: [facility];  Service: Gastroenterology;;   BREAST BIOPSY  08/2002   ESOPHAGOGASTRODUODENOSCOPY N/A 05/08/2023   Procedure: ESOPHAGOGASTRODUODENOSCOPY (EGD);  Surgeon: Lynann Bologna, MD;  Location: [facility];  Service: Gastroenterology;  Laterality: N/A;   TONSILLECTOMY     UPPER GASTROINTESTINAL ENDOSCOPY      Prior to Admission medications   Medication Sig Start Date End Date Taking? Authorizing Provider  alendronate (FOSAMAX) 70 MG tablet Take 70 mg by mouth once a week. 03/31/21  Yes [provider]  Cholecalciferol (VITAMIN D) 50 MCG (2000 UT) tablet Take 2,000 Units by mouth daily.   Yes [provider]  donepezil (ARICEPT) 10 MG tablet Take 1 tablet (10 mg total) by mouth at bedtime. 07/23/23  Yes Van Clines, MD  escitalopram (LEXAPRO) 10 MG tablet TAKE 1 & 1/2 (ONE & ONE-HALF) TABLETS BY MOUTH ONCE DAILY Patient taking differently:  Take 15 mg by mouth daily. 12/02/22  Yes [name], PMHNP  losartan (COZAAR) 50 MG tablet Take 1 tablet (50 mg total) by mouth daily. 10/06/22  Yes [name], FNP  mupirocin cream (BACTROBAN) 2 % Apply 1 Application topically 2 (two) times daily. 09/28/22  Yes Regal, Kirstie Peri,  DPM  pantoprazole (PROTONIX) 40 MG tablet Take 1 tablet (40 mg total) by mouth 2 (two) times daily. 06/28/23 08/27/23 Yes Sharron Petruska V, DO  rosuvastatin (CRESTOR) 40 MG tablet Take 1 tablet (40 mg total) by mouth daily. 10/06/22  Yes [name], FNP    Current Outpatient Medications  Medication Sig Dispense Refill   alendronate (FOSAMAX) 70 MG tablet Take 70 mg by mouth once a week.     Cholecalciferol (VITAMIN D) 50 MCG (2000 UT) tablet Take 2,000 Units by mouth daily.     donepezil (ARICEPT) 10 MG tablet Take 1 tablet (10 mg total) by mouth at bedtime. 90 tablet 3   escitalopram (LEXAPRO) 10 MG tablet TAKE 1 & 1/2 (ONE & ONE-HALF) TABLETS BY MOUTH ONCE DAILY (Patient taking differently: Take 15 mg by mouth daily.) 135 tablet 3   losartan (COZAAR) 50 MG tablet Take 1 tablet (50 mg total) by mouth daily. 90 tablet 3   mupirocin cream (BACTROBAN) 2 % Apply 1 Application topically 2 (two) times daily. 15 g 0   pantoprazole (PROTONIX) 40 MG tablet Take 1 tablet (40 mg total) by mouth 2 (two) times daily. 120 tablet 0   rosuvastatin (CRESTOR) 40 MG tablet Take 1 tablet (40 mg total) by mouth daily. 90 tablet 3   Current Facility-Administered Medications  Medication Dose Route Frequency Provider Last Rate Last Admin   0.9 %  sodium chloride infusion  500 mL Intravenous Continuous Zoella Roberti V, DO        Allergies as of 08/09/2023 - Review Complete 08/09/2023  Allergen Reaction Noted   Lovastatin      Family History  Problem Relation Age of Onset   Anxiety disorder Mother    Hyperparathyroidism Sister    Cancer Neg Hx    Breast cancer Neg Hx    Colon cancer Neg Hx    Esophageal cancer Neg Hx    Liver cancer Neg Hx    Rectal cancer Neg Hx     Social History   Socioeconomic History   Marital status: Married    Spouse name: Not on file   Number of children: 3   Years of education: 14   Highest education level: Some college, no degree  Occupational History    Occupation: Retired   Tobacco Use   Smoking status: Never   Smokeless tobacco: Never  Vaping Use   Vaping status: Never Used  Substance and Sexual Activity   Alcohol use: No   Drug use: No   Sexual activity: Yes  Other Topics Concern   Not on file  Social History Narrative   Left Handed   Two Story Home   Lives with Husband    Drinks coffee and Diet Pepsi sometimes   retired   International aid/development worker of Corporate investment banker Strain: Not on file  Food Insecurity: No Food Insecurity (05/06/2023)   Hunger Vital Sign    Worried About Running Out of Food in the Last Year: Never true    Ran Out of Food in the Last Year: Never true  Transportation Needs: No Transportation Needs (05/06/2023)   PRAPARE - Transportation    Lack  of Transportation (Medical): No    Lack of Transportation (Non-Medical): No  Physical Activity: Not on file  Stress: Not on file  Social Connections: Not on file  Intimate Partner Violence: Patient Unable To Answer (05/06/2023)   Humiliation, Afraid, Rape, and Kick questionnaire    Fear of Current or Ex-Partner: Patient unable to answer    Emotionally Abused: Patient unable to answer    Physically Abused: Patient unable to answer    Sexually Abused: Patient unable to answer    Physical Exam: Vital signs in last 24 hours: @BP  127/74   Pulse 66   Temp 98 F (36.7 C)   Ht 5' (1.524 m)   Wt 154 lb (69.9 kg)   SpO2 99%   BMI 30.08 kg/m  GEN: NAD EYE: Sclerae anicteric ENT: MMM CV: Non-tachycardic Pulm: CTA b/l GI: Soft, NT/ND NEURO:  Alert & Oriented x 3   Doristine Locks, DO [facility]   08/09/2023 9:49 AM

## 2023-08-09 NOTE — Patient Instructions (Signed)
Please read handouts provided. Continue present medications. Await pathology results. Clip Card. Repeat CBC and iron panel in 4 weeks. Follow-up in GI clinic as needed.  YOU HAD AN ENDOSCOPIC PROCEDURE TODAY AT THE [facility]:   Refer to the procedure report that was given to you for any specific questions about what was found during the examination.  If the procedure report does not answer your questions, please call your gastroenterologist to clarify.  If you requested that your care partner not be given the details of your procedure findings, then the procedure report has been included in a sealed envelope for you to review at your convenience later.  YOU SHOULD EXPECT: Some feelings of bloating in the abdomen. Passage of more gas than usual.  Walking can help get rid of the air that was put into your GI tract during the procedure and reduce the bloating. If you had a lower endoscopy (such as a colonoscopy or flexible sigmoidoscopy) you may notice spotting of blood in your stool or on the toilet paper. If you underwent a bowel prep for your procedure, you may not have a normal bowel movement for a few days.  Please Note:  You might notice some irritation and congestion in your nose or some drainage.  This is from the oxygen used during your procedure.  There is no need for concern and it should clear up in a day or so.  SYMPTOMS TO REPORT IMMEDIATELY:  Following upper endoscopy (EGD)  Vomiting of blood or coffee ground material  New chest pain or pain under the shoulder blades  Painful or persistently difficult swallowing  New shortness of breath  Fever of 100F or higher  Black, tarry-looking stools  For urgent or emergent issues, a gastroenterologist can be reached at any hour by calling [phone]. Do not use MyChart messaging for urgent concerns.    DIET:  We do recommend a small meal at first, but then you may proceed to your regular diet.  Drink plenty of fluids  but you should avoid alcoholic beverages for 24 hours.  ACTIVITY:  You should plan to take it easy for the rest of today and you should NOT DRIVE or use heavy machinery until tomorrow (because of the sedation medicines used during the test).    FOLLOW UP: Our staff will call the number listed on your records the next business day following your procedure.  We will call around 7:15- 8:00 am to check on you and address any questions or concerns that you may have regarding the information given to you following your procedure. If we do not reach you, we will leave a message.     If any biopsies were taken you will be contacted by phone or by letter within the next 1-3 weeks.  Please call us at [phone] if you have not heard about the biopsies in 3 weeks.    SIGNATURES/CONFIDENTIALITY: You and/or your care partner have signed paperwork which will be entered into your electronic medical record.  These signatures attest to the fact that that the information above on your After Visit Summary has been reviewed and is understood.  Full responsibility of the confidentiality of this discharge information lies with you and/or your care-partner.

## 2023-08-09 NOTE — Progress Notes (Signed)
Report given to PACU, vss 

## 2023-08-09 NOTE — Progress Notes (Signed)
0956 Robinul 0.1 mg IV given due large amount of secretions upon assessment.  MD made aware, vss  

## 2023-08-09 NOTE — Progress Notes (Signed)
Called to room to assist during endoscopic procedure.  Patient ID and intended procedure confirmed with present staff. Received instructions for my participation in the procedure from the performing physician.  

## 2023-08-09 NOTE — Therapy (Signed)
OUTPATIENT PHYSICAL THERAPY TREATMENT   Patient Name: Brandi Walsh MRN: [id number] DOB:August 15, 1946, 77 y.o., female Today's Date: 08/09/2023  END OF SESSION:        Past Medical History:  Diagnosis Date   Abnormal liver enzymes 09/19/2019   Blurred vision 09/19/2019   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   Conjunctivitis of both eyes 05/27/2023   Dementia likely due to Alzheimer's disease 07/06/2023   Dysuria 05/27/2023   Essential hypertension 10/13/2007   GERD (gastroesophageal reflux disease) 02/19/2012   Hypercalcemia 03/15/2019   Hyperlipidemia 11/11/2020   Localized edema 01/22/2017   Major depressive disorder 11/24/2018   Melena 05/06/2023   Menopausal syndrome 10/13/2007   Osteoporosis 10/13/2007   Polycythemia 12/03/2010   Primary hyperparathyroidism 06/12/2013   Type II diabetes mellitus 10/13/2007   Past Surgical History:  Procedure Laterality Date   ABDOMINAL HYSTERECTOMY     BIOPSY  05/08/2023   Procedure: BIOPSY;  Surgeon: Lynann Bologna, MD;  Location: [facility];  Service: Gastroenterology;;   BREAST BIOPSY  08/2002   ESOPHAGOGASTRODUODENOSCOPY N/A 05/08/2023   Procedure: ESOPHAGOGASTRODUODENOSCOPY (EGD);  Surgeon: Lynann Bologna, MD;  Location: [facility];  Service: Gastroenterology;  Laterality: N/A;   TONSILLECTOMY     UPPER GASTROINTESTINAL ENDOSCOPY     Patient Active Problem List   Diagnosis Date Noted   Hematuria 07/09/2023   Dementia likely due to Alzheimer's disease 07/06/2023   Dysuria 05/27/2023   Hyperlipidemia 11/11/2020   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   Abnormal liver enzymes 09/19/2019   Blurred vision 09/19/2019   Hypercalcemia 03/15/2019   Major depressive disorder 11/24/2018   Primary hyperparathyroidism 06/12/2013   GERD (gastroesophageal reflux disease) 02/19/2012   Polycythemia 12/03/2010   Type II diabetes mellitus 10/13/2007   Essential hypertension 10/13/2007   Osteoporosis 10/13/2007   Menopausal syndrome 10/13/2007    PCP: Elenore Paddy, NP  REFERRING PROVIDER: [name], PA-C  REFERRING DIAG: [phone] (ICD-10-CM) - Gait instability  Rationale for Evaluation and Treatment: Rehabilitation  THERAPY DIAG:  No diagnosis found.  ONSET DATE: at least 5 years, more notable over past few months  SUBJECTIVE:                                                                                                                                                                                           SUBJECTIVE STATEMENT: 08/09/2023 No pain on arrival.    PERTINENT HISTORY:  HTN, GERD, DM, osteoporosis, parkinsonism (per neurology notes)  PAIN:  Denies any significant pain affecting mobility  PRECAUTIONS: fall risk, osteoporosis   WEIGHT BEARING RESTRICTIONS: No  FALLS:  Has patient fallen in last 6 months? No  LIVING ENVIRONMENT: 2  story home with husband, her granddaughter lives with her on main floor, has help entering a couple steps. Granddaughter helps with housework, she also works full time - has 77 year old and 77 year old No AD but does get assistance with ambulation from family Has grab bars in bathroom, shower chair. Has tub shower, does sponge baths  OCCUPATION: retired - used to work with [employer]. Enjoys reading, watching TV (wheel of fortune, jeopardy)  PLOF: has help from family for dressing and bathing; has needed assistance for ~5 years  PATIENT GOALS: be more independent, get out into community more   NEXT MD VISIT: July 23 2023  OBJECTIVE: (objective measures completed at initial evaluation unless otherwise dated)   DIAGNOSTIC FINDINGS:  05/31/23 Brain MRI "IMPRESSION: 1. No evidence of an acute intracranial abnormality. 2. Mild chronic small vessel ischemic changes within the cerebral white matter, similar to the prior brain MRI of 07/22/2020. 3. There are a few chronic microhemorrhages within the supratentorial brain, as described and increased in number from the prior MRI. 4. Mild  generalized cerebral atrophy."  PATIENT SURVEYS:  FOTO deferred given time constraints and history of dementia  COGNITION: Overall cognitive status: conversationally appropriate for today's visit, some increased processing time noted. Hx of cognitive impairments/dementia per chart review     SENSATION/NEURO: Light touch intact all extremities, no extinction   Finger<>nose testing bradykinetic and mild tremors L greater than R No overt ataxia with gait, shuffling pattern, bradykinetic movements and reduced initiation with motor tasks   POSTURE: tendency to hold R UE in elbow flexion, kyphotic and forward head posture   RANGE OF MOTION:     Active  Right eval Left eval  Shoulder flexion ~90 deg ~90 deg  Functional ER combo occiput occiput  Functional IR combo    Knee extension    Ankle dorsiflexion At least neutral Limited on L compared to R   (Blank rows = not tested) (Key: WFL = within functional limits not formally assessed, * = concordant pain, s = stiffness/stretching sensation, NT = not tested)  Comments: limited by stiffness   STRENGTH TESTING:  MMT Right eval Left eval  Shoulder flexion    Shoulder abduction    Elbow flexion    Elbow extension    Grip strength (gross)    Hip flexion 4 4  Hip abduction (modified sitting) 3+ 3+  Knee flexion 4 4  Knee extension 4- 4-  Ankle dorsiflexion 4+ 4+  Ankle plantarflexion     (Blank rows = not tested) (Key: WFL = within functional limits not formally assessed, * = concordant pain, s = stiffness/stretching sensation, NT = not tested)  Comments:     FUNCTIONAL TESTS:  5xSTS: 18 sec with heavy UE support, unable to achieve full upright   Dynamic Gait Index:   1. Gait Level Surface;  1  2. Change in Gait Speed; 2  3. Gait With Horizontal Head Turns; 2  4. Gait With Vertical Head Turns; 2  5. Gait and Pivot Turn; 1  6. Step Over Obstacle; 1  7. Step Around Obstacles; 1  8. Steps;  1 (per report)   Grading  scale: 3 No impairment; 2 Mild impairment/AD use; 1 Moderate impairment; 0 Severe impairment or unable to perform   Total score: 11/24  Cutoff score for fall risk: </=19/24   GAIT: Distance walked: within clinic Assistive device utilized: None Level of assistance: Complete Independence Comments: shuffling pattern, reduced initiation, forward flexed posture, reduced trunk rotation  and arm swing (more affected on R)  TODAY'S TREATMENT:  [facility] Adult PT Treatment:                                                DATE: 07/22/23 Therapeutic Exercise: Nustep L 4 x 5 min 3 way hip standing yellow band at ankles  Heel toe raises Gastroc stretch in // bars  Step stretch for hip flexor/calf 6 inch step ups with UE support x 8 each   Therapeutic Activity: Stepping over obstacles -CGA Side stepping without UE- SBA Gait around obstacles x 4 - CGA Gait with high knees -CGA Stair negotiation - SBA/mod i Tandem stance on AIREX trials- intermittent touch 7-10 sec  Gait throughout clinic with CGA and cues for increased step length   [facility] Adult PT Treatment:                                                DATE: 07-20-23 Therapeutic Exercise: Nustep L4 UE/LE x 5 minutes  Standing March 10 x 2  Walking march 2 lengths of llbars 15 feet Heel/toe raises 2 x 15 Standing hip abduction 10 x 2  hold 3 sec Sidestepping 2 lengths  of llbars  Sit to stand with PT CGA x 1 with pt holding 8 lb DB x 10 Gastro stretch x 2 each, in llbars PT assist  Neuromuscular re-ed: Narrow stance on AIREX- added head turns and nods Semi tandem On AIREX- added head turns and nods Therapeutic Activity: Stepping over obstacles (yoga blocks) step to pattern and alternating pattern without UE assist, 4 obstacles x 4  Side stepping over obstacles x 4  obstacles each way, 1 UE Gait in //bars with cues or step length and heel strike with and without UE                                                                                                                               [facility] Adult PT Treatment:                                                DATE: 07/15/23 Therapeutic Exercise: Nustep L4 UE/LE x 5 minutes  Standing hip abduction 10 x 2  Standing March 10 x 2  Heel/toe raises Gastro stretch x 2 each, at counter   Neuromuscular re-ed: Narrow stance on AIREX- added head turns and nods Semi tandem On AIREX- added head turns and nods Therapeutic Activity: Stepping over obstacles (yoga blocks) step to pattern and alternating pattern without UE assist, 4 obstacles  x 4  Side stepping over obstacles x 4  obstacles each way, 1 UE Gait in //bars with cues or step length and heel strike with and without UE Side stepping in parallel bars without UE, cues for step length     [facility] Adult PT Treatment:                                                DATE: 07/09/23 Therapeutic Exercise: Nustep L3 UE/LE x 5 minutes  LAQ 4# alternating x 15 each  STS x 6 without UE Neuromuscular re-ed: Narrow stance on AIREX- added head turns and nods Semi tandem On AIREX- added head turns and nods Therapeutic Activity: Side stepping in // bars 2 lengths Gait with cues for step length  Retro walking in // bars cues for step length- 2 lengths Tandem gait intermittent UE touch as needed - 2 lengths Stepping over obstacles (yoga blocks) step to pattern and alternating pattern with 1 UE assist, 4 obstacles x 4  Side stepping over obstacles x 4  obstacles each way   [facility] Adult PT Treatment:                                                DATE: 07/07/23 Neuromuscular re-ed: Partial tandem UE punches 2x8 BIL - cues for truncal rotation, posture, and sequencing; for improved truncal dissociation 6x78ft ambulation with cues to increase amplitude of movement (takes 15 steps for first bout without cueing, cues to return in less steps); subsequent bouts in 13 steps, 12 steps, 12 steps, 11 steps, 10 steps; seated rest every 2 bouts HEP balance update -  emphasis on performing safely at home with counter support Therapeutic Activity: Blocked practice STS 4x5 cues for mechanics, sequencing, and UE support  Standing march at counter with weaning UE support 2x8 BIL, for improved weight shifting, cues for posture and stability     [facility] Adult PT Treatment:                                                DATE: 07/01/23 Therapeutic Exercise: STS with fwd reach x8 cues for trunk mechanics and pacing STS w fwd reach x8 with cues for improved BOS and start position STS w fwd/OH reach x8 with cues for trunk mechanics Seated march 3x8 BIL LE   Neuromuscular re-ed: Stepping over 3 therabands as external cues, leading with different leg each trial - 2x3 laps cues for increased step length, tactile cues to maintain LOS within BOS  Fwd/retro walking along counter 4 laps cues for step length and posture, pacing Gait 2x13ft cues for stability, pacing and step amplitude       [facility] Adult PT Treatment:                                                DATE: 06/16/23 Deferred in favor of increased time with education, discussion, and balance assessment  PATIENT EDUCATION:  Education details: rationale  for interventions, HEP  Person educated: Patient Education method: Explanation, Demonstration, Tactile cues, Verbal cues Education comprehension: verbalized understanding, returned demonstration, verbal cues required, tactile cues required, and needs further education    HOME EXERCISE PROGRAM: Access Code: [email or web address] URL: [email or web address] Date: 07/07/2023 Prepared by: Fransisco Hertz  Program Notes Remember with all these exercises to perform with almost exaggerated movements.  Exercises - Sit to Stand  - 1 x daily - 7 x weekly - 2 sets - 10 reps - Standing Hip Abduction with Resistance at Ankles and Counter Support  - 1 x daily - 7 x weekly - 2 sets - 10 reps - Standing March with Counter Support  - 1 x daily - 7 x weekly - 2 sets - 8  reps  ASSESSMENT:  CLINICAL IMPRESSION: 08/09/2023 Pt arrives w/o pain. She is able to negotiate stairs with bilateral handrails without assistance. Used steps to stretch gastroc/ hip flexors. Improved static stance, able to hold Tandem on AIREX 7-10 sec without UE. She steps over obstacles and negotiates around obstacles with SBA. With verbal cues, she can increase her step length. Continued with Hip mobility and strengthening with pt requiring 4 rest breaks due to session being mostly closed chain activity. Will plan to check progress toward LTGs next visit.     EVALUATION: Patient is a pleasant 77 y.o. woman who was seen today for physical therapy evaluation and treatment for gait instability ongoing for last few years. Accompanied by her granddaughter with her consent. On exam pt demonstrates altered gait kinematics and transfer mechanics, mild generalized weakness, impaired coordination, and stiffness throughout BUE/trunk. Pt has known history of parkinsonism per review of neurology notes (most recent MRI 05/31/23, refer to [facility]), and in clinic today she does demonstrate shuffling gait pattern, generalized bradykinesia, reduced motor initiation, and incoordination. Given this, discussion w/ pt and family about possibility of transferring to our neuro clinic - they state they would prefer to continue treatment at our clinic for now, which is respected. Deferring HEP today given increased time with balance assessment pt discussion/education. No adverse events, tolerates session well overall. Close SBA-CGA provided for safety with mobility within clinic today. Recommend skilled PT to address aforementioned deficits and improve functional tolerance, reduce fall risk. Pt departs today's session in no acute distress, all voiced questions/concerns addressed appropriately from PT perspective.     OBJECTIVE IMPAIRMENTS: Abnormal gait, decreased activity tolerance, decreased balance, decreased coordination,  decreased endurance, decreased mobility, difficulty walking, decreased ROM, decreased strength, impaired flexibility, improper body mechanics, postural dysfunction, and pain.   ACTIVITY LIMITATIONS: carrying, lifting, bending, standing, squatting, stairs, transfers, bathing, dressing, reach over head, hygiene/grooming, and locomotion level  PARTICIPATION LIMITATIONS: meal prep, cleaning, laundry, driving, shopping, and community activity  PERSONAL FACTORS: Age, Time since onset of injury/illness/exacerbation, and 3+ comorbidities: HTN, DM, osteoporosis, depression, parkinsonism  are also affecting patient's functional outcome.   REHAB POTENTIAL: Fair given chronicity  CLINICAL DECISION MAKING: Evolving/moderate complexity  EVALUATION COMPLEXITY: Moderate   GOALS: Goals reviewed with patient? No although did review role of PT, POC  SHORT TERM GOALS: Target date: 07/14/2023 Pt will demonstrate appropriate understanding and performance of initially prescribed HEP in order to facilitate improved independence with management of symptoms.  Baseline: HEP TBD 07/07/23: pt endorses daily performance of HEP Goal status: MET  2. Pt will be able to perform five consecutive sit to stands with upper extremity support and ability to achieve full upright in order to improve transfer mechanics.  Baseline: heavy UE support,  unable to achieve full upright  07/07/23: able to perform blocked practice 4x5 STS with UE support, achieving full upright  Goal status: MET  LONG TERM GOALS: Target date: 08/11/2023 1.  Pt will demonstrate global improvement of at least 1 pt in LE MMT in order to facilitate improved functional strength.  Baseline: see MMT chart above  Goal status: INITIAL   2.  Pt will perform Dynamic Gait Index with score greater than or equal to 16/24 in order to indicate reduced fall risk (<19/24 predictive of falls in elderly per [facility] et al 1995, MCID 1.9 per Pardasaney et al  2012)  Baseline: 11/24  Goal status: INITIAL    3. Pt will perform 5xSTS in <15 sec with UE support and achieving full upright in order to demonstrate reduced fall risk and improved functional independence. (MCID of 2.3sec)  Baseline: 18sec with altered kinematics, unable to achieve full upright, heavy UE support  Goal status: INITIAL   4. Pt will demonstrate appropriate performance of final prescribed HEP in order to facilitate improved self-management of symptoms post-discharge.   Baseline: HEP TBD  Goal status: INITIAL    PLAN:  PT FREQUENCY: 1-2x/week  PT DURATION: 8 weeks  PLANNED INTERVENTIONS: Therapeutic exercises, Therapeutic activity, Neuromuscular re-education, Balance training, Gait training, Patient/Family education, Self Care, Joint mobilization, Stair training, Vestibular training, Cryotherapy, Moist heat, Taping, Manual therapy, and Re-evaluation.  PLAN FOR NEXT SESSION: update/review HEP PRN. Continue working on postural stability, increased amplitude of movements.    Date of referral: 05-17-23 Referring provider: [name], PA-C  Referring diagnosis? R26.81 (ICD-10-CM) - Gait instability  Treatment diagnosis? (if different than referring diagnosis) muscle weakness ( generalized), other abnormalities of gait and mobility What was this (referring dx) caused by? 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]  Other: _vertigo, dizziness, weakness following hospitalization for GI bleed___________  Ashby Dawes of Condition  []  Initial Onset (within last 3 months)  []  Recurrent (multiple episodes of < 3 months)  [x]  Chronic (continuous duration > 3 months)  Laterality: []  Rt []  Lt [x]  Both  Current Functional Measure Score  Pt score deferred due to dementia for FOTO  []  FOTO ________ []  Neck Index ______  []  Back Index ______  []  DASH _______  []  LEFS________  DGI- Total score:  11/24            Cutoff score for fall risk: </=19/24  Briefly describe symptoms: Generalized weakness, post hospitalizaton for GI bleed.  Pt with mild atrophy of brain and signs of dementia  How did symptoms start: ***  Average pain intensity:  Last 24 hours: ***  Past week: ***  How often does the pt experience symptoms?  []  Constantly []  Frequently []  Occasionally []  Intermittently  How much have the symptoms interfered with usual daily activities?  []  Not at all  []  A little bit  []  Moderately  []  Quite a bit  []  Extremely  How has condition changed since care began at this facility?  []  NA - iniital visit  []  Much worse  []  Worse  []  A little worse  []  No change  []  A little better  []  Better  []  Much better  In general, how is the patients overall health?  []  Excellend  []  Very good  []  Good  []  Fair  []  Poor

## 2023-08-09 NOTE — Op Note (Signed)
[facility] Patient Name: Brandi Walsh Procedure Date: 08/09/2023 9:51 AM MRN: [id number] Endoscopist: Doristine Locks , MD, [id number] Age: 77 Referring MD:  Date of Birth: 1946/03/14 Gender: Female Account #: [id number] Procedure:                Upper GI endoscopy Indications:              Anemia, Follow-up of gastric ulcer                           Recent hospital admission for UGIB 6/6-05/08/23.                            Underwent EGD which showed 2 nonbleeding gastric                            ulcers and gastric polyp. Treated with high dose                            PPI and recommended repeat EGD as outpatient to                            evaluate for ulcer healing and polypectomy. Medicines:                Monitored Anesthesia Care Procedure:                Pre-Anesthesia Assessment:                           - Prior to the procedure, a History and Physical                            was performed, and patient medications and                            allergies were reviewed. The patient's tolerance of                            previous anesthesia was also reviewed. The risks                            and benefits of the procedure and the sedation                            options and risks were discussed with the patient.                            All questions were answered, and informed consent                            was obtained. Prior Anticoagulants: The patient has                            taken no anticoagulant or antiplatelet agents. ASA  Grade Assessment: III - A patient with severe                            systemic disease. After reviewing the risks and                            benefits, the patient was deemed in satisfactory                            condition to undergo the procedure.                           After obtaining informed consent, the endoscope was                            passed under direct  vision. Throughout the                            procedure, the patient's blood pressure, pulse, and                            oxygen saturations were monitored continuously. The                            Olympus Scope [id number] was introduced through the                            mouth, and advanced to the third part of duodenum.                            The upper GI endoscopy was accomplished without                            difficulty. The patient tolerated the procedure                            well. Scope In: Scope Out: Findings:                 A 2 cm sliding type hiatal hernia was present.                           The remainder of the esophagus was normal.                           A single 15 mm semi-pedunculated polyp was found at                            the incisura. There was mild contact oozing that                            the tip of the polyp. The polyp was removed with a  hot snare and retrieved with a Roth net. Resection                            and retrieval were complete. To close a defect                            after polypectomy, two hemostatic clips were                            successfully placed (MR conditional). There was no                            bleeding at the end of the procedure.                           The gastric fundus, gastric body, gastric antrum                            and pylorus were normal. The previously noted                            gastric ulcers have since healed.                           The examined duodenum was normal. Complications:            No immediate complications. Estimated Blood Loss:     Estimated blood loss was minimal. Impression:               - 2 cm hiatal hernia.                           - Normal esophagus.                           - A single gastric polyp. Resected and retrieved.                            Clips (MR conditional) were placed.                            - Normal gastric fundus, gastric body, antrum and                            pylorus.                           - Normal examined duodenum. Recommendation:           - Patient has a contact number available for                            emergencies. The signs and symptoms of potential                            delayed complications were discussed with the  patient. Return to normal activities tomorrow.                            Written discharge instructions were provided to the                            patient.                           - Resume previous diet.                           - Continue present medications.                           - Await pathology results.                           - Repeat CBC and iron panel in 4 weeks.                           - Follow-up in the GI clinic as needed. Doristine Locks, MD 08/09/2023 10:25:14 AM

## 2023-08-10 ENCOUNTER — Other Ambulatory Visit: Payer: Self-pay

## 2023-08-10 ENCOUNTER — Encounter: Payer: Self-pay | Admitting: Physical Therapy

## 2023-08-10 ENCOUNTER — Telehealth: Payer: Self-pay

## 2023-08-10 ENCOUNTER — Ambulatory Visit: Payer: Medicare Other | Attending: Physician Assistant | Admitting: Physical Therapy

## 2023-08-10 DIAGNOSIS — R29898 Other symptoms and signs involving the musculoskeletal system: Secondary | ICD-10-CM | POA: Insufficient documentation

## 2023-08-10 DIAGNOSIS — R2689 Other abnormalities of gait and mobility: Secondary | ICD-10-CM | POA: Insufficient documentation

## 2023-08-10 DIAGNOSIS — R42 Dizziness and giddiness: Secondary | ICD-10-CM

## 2023-08-10 DIAGNOSIS — M6281 Muscle weakness (generalized): Secondary | ICD-10-CM | POA: Insufficient documentation

## 2023-08-10 DIAGNOSIS — R29818 Other symptoms and signs involving the nervous system: Secondary | ICD-10-CM | POA: Diagnosis present

## 2023-08-10 DIAGNOSIS — G20C Parkinsonism, unspecified: Secondary | ICD-10-CM

## 2023-08-10 NOTE — Telephone Encounter (Signed)
  Follow up Call-     08/09/2023    9:20 AM  Call back number  Post procedure Call Back phone  # [phone]  Permission to leave phone message Yes     Phone rang and rang with no voicemail

## 2023-08-13 ENCOUNTER — Ambulatory Visit: Payer: Medicare Other | Admitting: Physical Therapy

## 2023-08-13 LAB — SURGICAL PATHOLOGY

## 2023-08-17 ENCOUNTER — Ambulatory Visit: Payer: Medicare Other | Admitting: Physical Therapy

## 2023-08-17 ENCOUNTER — Other Ambulatory Visit: Payer: Self-pay

## 2023-08-17 DIAGNOSIS — K259 Gastric ulcer, unspecified as acute or chronic, without hemorrhage or perforation: Secondary | ICD-10-CM

## 2023-08-17 DIAGNOSIS — D509 Iron deficiency anemia, unspecified: Secondary | ICD-10-CM

## 2023-08-17 DIAGNOSIS — K317 Polyp of stomach and duodenum: Secondary | ICD-10-CM

## 2023-08-17 NOTE — Progress Notes (Signed)
Spoke to granddaughter and they will come on 09-06-2023 to do lab work. Orders placed and they know where to go

## 2023-08-18 ENCOUNTER — Ambulatory Visit: Payer: Medicare Other | Admitting: Physical Therapy

## 2023-08-19 ENCOUNTER — Ambulatory Visit: Payer: Medicare Other | Admitting: Physical Therapy

## 2023-08-23 ENCOUNTER — Encounter: Payer: Self-pay | Admitting: Nurse Practitioner

## 2023-08-24 ENCOUNTER — Ambulatory Visit: Payer: Medicare Other | Admitting: Physical Therapy

## 2023-09-02 ENCOUNTER — Ambulatory Visit: Payer: Medicare Other | Admitting: Physical Therapy

## 2023-09-03 ENCOUNTER — Telehealth: Payer: Self-pay | Admitting: Gastroenterology

## 2023-09-03 ENCOUNTER — Other Ambulatory Visit: Payer: Self-pay | Admitting: Gastroenterology

## 2023-09-03 ENCOUNTER — Telehealth: Payer: Self-pay | Admitting: Neurology

## 2023-09-03 NOTE — Telephone Encounter (Signed)
Caller stated she would like a call back from nurse regarding decreasing medication and getting new script.

## 2023-09-03 NOTE — Telephone Encounter (Signed)
Advised granddaughter that we do want patient to remain on pantoprazole 40 mg twice daily. Rx was sent earlier today for refills. In addition, reminded granddaughter that patient needs repeat labs next week. She verbalizes understanding.

## 2023-09-03 NOTE — Telephone Encounter (Signed)
Patients granddaughter called to ask if the patient is to continue taking Pantoprazole because she is almost out of it. Please advise.

## 2023-09-06 ENCOUNTER — Other Ambulatory Visit (INDEPENDENT_AMBULATORY_CARE_PROVIDER_SITE_OTHER): Payer: Medicare Other

## 2023-09-06 DIAGNOSIS — K317 Polyp of stomach and duodenum: Secondary | ICD-10-CM

## 2023-09-06 DIAGNOSIS — D509 Iron deficiency anemia, unspecified: Secondary | ICD-10-CM

## 2023-09-06 DIAGNOSIS — K259 Gastric ulcer, unspecified as acute or chronic, without hemorrhage or perforation: Secondary | ICD-10-CM | POA: Diagnosis not present

## 2023-09-06 DIAGNOSIS — R2681 Unsteadiness on feet: Secondary | ICD-10-CM

## 2023-09-06 DIAGNOSIS — R531 Weakness: Secondary | ICD-10-CM

## 2023-09-06 LAB — CBC
HCT: 37.8 % (ref 36.0–46.0)
Hemoglobin: 11.7 g/dL — ABNORMAL LOW (ref 12.0–15.0)
MCHC: 30.9 g/dL (ref 30.0–36.0)
MCV: 74.6 fL — ABNORMAL LOW (ref 78.0–100.0)
Platelets: 264 10*3/uL (ref 150.0–400.0)
RBC: 5.07 Mil/uL (ref 3.87–5.11)
RDW: 19.5 % — ABNORMAL HIGH (ref 11.5–15.5)
WBC: 7.6 10*3/uL (ref 4.0–10.5)

## 2023-09-06 LAB — IBC + FERRITIN
Ferritin: 11 ng/mL (ref 10.0–291.0)
Iron: 42 ug/dL (ref 42–145)
Saturation Ratios: 11.7 % — ABNORMAL LOW (ref 20.0–50.0)
TIBC: 358.4 ug/dL (ref 250.0–450.0)
Transferrin: 256 mg/dL (ref 212.0–360.0)

## 2023-09-06 NOTE — Telephone Encounter (Signed)
Pls get more information, which medication is she talking about, the Donepezil? We increased dose to 10mg  on last visit, is she wanting to go back to 5mg ?  Pls check on reason for dose reduction request, thanks

## 2023-09-07 ENCOUNTER — Encounter: Payer: Self-pay | Admitting: Gastroenterology

## 2023-09-07 ENCOUNTER — Encounter: Payer: Self-pay | Admitting: Nurse Practitioner

## 2023-09-07 ENCOUNTER — Encounter: Payer: Self-pay | Admitting: Neurology

## 2023-09-07 DIAGNOSIS — D509 Iron deficiency anemia, unspecified: Secondary | ICD-10-CM

## 2023-09-07 MED ORDER — DONEPEZIL HCL 5 MG PO TABS: ORAL_TABLET | ORAL | 3 refills | Status: DC

## 2023-09-08 NOTE — Telephone Encounter (Signed)
-----   Message ----- From: Dwain Sarna Sent: 09/08/2023   4:18 PM EDT To: Shellia Cleverly, DO Subject: Referral Closure                               Dear Doristine Locks DO,  Thank you for referring Brandi Walsh to hematology/oncology.    The reason for this communication is to share we have received your patient's referral and have closed the referral because the patient granddaughter Brandi Walsh) has declined our services.     If you have questions about this referral or we can be of further assistance, please contact our office at [phone] and ask to speak with the new patient coordinator.  Kind regards,  [facility] - New Patient Intake Team

## 2023-09-09 ENCOUNTER — Encounter: Payer: Self-pay | Admitting: Nurse Practitioner

## 2023-09-09 ENCOUNTER — Ambulatory Visit: Payer: Medicare Other | Admitting: Nurse Practitioner

## 2023-09-09 VITALS — BP 134/76 | HR 74 | Temp 97.6°F | Ht 60.0 in | Wt 151.2 lb

## 2023-09-09 DIAGNOSIS — D509 Iron deficiency anemia, unspecified: Secondary | ICD-10-CM

## 2023-09-09 DIAGNOSIS — K219 Gastro-esophageal reflux disease without esophagitis: Secondary | ICD-10-CM

## 2023-09-09 DIAGNOSIS — R809 Proteinuria, unspecified: Secondary | ICD-10-CM | POA: Diagnosis not present

## 2023-09-09 LAB — URINALYSIS, ROUTINE W REFLEX MICROSCOPIC
Bilirubin Urine: NEGATIVE
Hgb urine dipstick: NEGATIVE
Ketones, ur: NEGATIVE
Leukocytes,Ua: NEGATIVE
Nitrite: NEGATIVE
Specific Gravity, Urine: 1.015 (ref 1.000–1.030)
Total Protein, Urine: NEGATIVE
Urine Glucose: NEGATIVE
Urobilinogen, UA: 1 (ref 0.0–1.0)
pH: 7 (ref 5.0–8.0)

## 2023-09-09 NOTE — Assessment & Plan Note (Signed)
Chronic, improving We discussed risks and benefits of oral iron via supplement, prescription, and IV iron infusion. Per shared decision making patient and granddaughter would like to focus on dietary intake of iron and recheck CBC monthly for a few months.  At that point if she remains anemic they may consider either over-the-counter ferrous sulfate in tablet or oral liquid form.  If she does not tolerate could consider accrufer versus IV iron infusion. F/u for CBC in 1 month

## 2023-09-09 NOTE — Assessment & Plan Note (Signed)
Recheck urinalysis today, further recommendations may be made based upon the results.

## 2023-09-09 NOTE — Assessment & Plan Note (Signed)
Chronic, stable Patient no longer taking NSAIDs I think it is reasonable for patient to taper off or down on pantoprazole.  We discussed risks and benefits of long-term use of PPI. Per shared decision making patient will taper down to pantoprazole 40 mg every other day, after a few months may consider discontinuing altogether.

## 2023-09-09 NOTE — Progress Notes (Signed)
Established Patient Office Visit  Subjective   Patient ID: Brandi Walsh, female    DOB: 1946-07-29  Age: 77 y.o. MRN: [id number]  Chief Complaint  Patient presents with   Medical Management of Chronic Issues    Follow up   Anemia    Patient arrives today accompanied by granddaughter and has been.  Anemia/hx of GI bleed: Here to discuss how to taper off of PPI after whether or not this is advisable.  Also want to discuss her anemia and whether or not she truly needs to start an iron supplement.  Patient has history of GI bleed secondary to NSAID use.  Last labs were on 09/06/2023, which identified hemoglobin of 11.7, normal iron level, normal ferritin, slightly low saturation ratio of 11.7%.  RDW elevated at 19.5.  In comparison her hemoglobin was 9.7 back in June 2024.  Granddaughter is concerned regarding starting over-the-counter iron supplementation due to risk of constipation and GI upset.  Additionally she feels that the hemoglobin level may not be low enough to warrant IV iron infusion as recommended by her gastroenterologist.  Proteinuria: Incidental finding on urine testing.  Has been referred to nephrologist, but would like to see if proteinuria is still present before following through with nephrology appointment.  Here to have this retested.  Patient reports she is feeling well and has no concerns.        Objective:     BP 134/76   Pulse 74   Temp 97.6 F (36.4 C) (Temporal)   Ht 5' (1.524 m)   Wt 151 lb 4 oz (68.6 kg)   SpO2 96%   BMI 29.54 kg/m    Physical Exam Vitals reviewed.  Constitutional:      General: She is not in acute distress.    Appearance: Normal appearance.  HENT:     Head: Normocephalic and atraumatic.  Neck:     Vascular: No carotid bruit.  Cardiovascular:     Rate and Rhythm: Normal rate and regular rhythm.     Pulses: Normal pulses.     Heart sounds: Normal heart sounds.  Pulmonary:     Effort: Pulmonary effort is normal.      Breath sounds: Normal breath sounds.  Skin:    General: Skin is warm and dry.  Neurological:     General: No focal deficit present.     Mental Status: She is alert and oriented to person, place, and time.  Psychiatric:        Mood and Affect: Mood normal.        Behavior: Behavior normal.        Judgment: Judgment normal.      No results found for any visits on 09/09/23.    The ASCVD Risk score (Arnett DK, et al., 2019) failed to calculate for the following reasons:   The valid total cholesterol range is 130 to 320 mg/dL    Assessment & Plan:   Problem List Items Addressed This Visit       Digestive   GERD (gastroesophageal reflux disease)    Chronic, stable Patient no longer taking NSAIDs I think it is reasonable for patient to taper off or down on pantoprazole.  We discussed risks and benefits of long-term use of PPI. Per shared decision making patient will taper down to pantoprazole 40 mg every other day, after a few months may consider discontinuing altogether.      Relevant Medications   pantoprazole (PROTONIX) 40 MG tablet  Other   Proteinuria - Primary    Recheck urinalysis today, further recommendations may be made based upon the results.      Relevant Orders   Urinalysis, Routine w reflex microscopic   Urine Culture   IDA (iron deficiency anemia)    Chronic, improving We discussed risks and benefits of oral iron via supplement, prescription, and IV iron infusion. Per shared decision making patient and granddaughter would like to focus on dietary intake of iron and recheck CBC monthly for a few months.  At that point if she remains anemic they may consider either over-the-counter ferrous sulfate in tablet or oral liquid form.  If she does not tolerate could consider accrufer versus IV iron infusion. F/u for CBC in 1 month      Relevant Orders   CBC   Ferritin   Iron    Return in about 3 months (around 12/10/2023) for f/u as sched.    Elenore Paddy, NP

## 2023-09-09 NOTE — Telephone Encounter (Signed)
She is tolerating Aricept 5mg , no rx needed just FYI, thanks

## 2023-09-12 LAB — URINE CULTURE

## 2023-09-14 ENCOUNTER — Encounter: Payer: Self-pay | Admitting: Physical Therapy

## 2023-09-14 ENCOUNTER — Ambulatory Visit: Payer: Medicare Other | Attending: Nurse Practitioner | Admitting: Physical Therapy

## 2023-09-14 ENCOUNTER — Other Ambulatory Visit: Payer: Self-pay | Admitting: Family

## 2023-09-14 VITALS — BP 108/74 | HR 71

## 2023-09-14 DIAGNOSIS — G20C Parkinsonism, unspecified: Secondary | ICD-10-CM | POA: Diagnosis not present

## 2023-09-14 DIAGNOSIS — R2689 Other abnormalities of gait and mobility: Secondary | ICD-10-CM | POA: Insufficient documentation

## 2023-09-14 DIAGNOSIS — R293 Abnormal posture: Secondary | ICD-10-CM | POA: Diagnosis present

## 2023-09-14 DIAGNOSIS — R42 Dizziness and giddiness: Secondary | ICD-10-CM | POA: Insufficient documentation

## 2023-09-14 DIAGNOSIS — M6281 Muscle weakness (generalized): Secondary | ICD-10-CM | POA: Insufficient documentation

## 2023-09-14 DIAGNOSIS — R29818 Other symptoms and signs involving the nervous system: Secondary | ICD-10-CM | POA: Diagnosis present

## 2023-09-14 MED ORDER — AMOXICILLIN 500 MG PO CAPS: 500.0000 mg | ORAL_CAPSULE | Freq: Three times a day (TID) | ORAL | 0 refills | Status: AC

## 2023-09-14 NOTE — Therapy (Signed)
OUTPATIENT PHYSICAL THERAPY VESTIBULAR EVALUATION     Patient Name: KERIANN GALLOGLY MRN: [id number] DOB:07/26/1946, 77 y.o., female Today's Date: 09/14/2023  END OF SESSION:  PT End of Session - 09/14/23 0849     Visit Number 1    Number of Visits 9    Date for PT Re-Evaluation 11/13/23    Authorization Type [employer] medicare    PT Start Time 0847    PT Stop Time 0927    PT Time Calculation (min) 40 min    Activity Tolerance Patient tolerated treatment well   limited by dizziness   Behavior During Therapy [facility] for tasks assessed/performed             Past Medical History:  Diagnosis Date   Abnormal liver enzymes 09/19/2019   Blurred vision 09/19/2019   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   Conjunctivitis of both eyes 05/27/2023   Dementia likely due to Alzheimer's disease 07/06/2023   Dysuria 05/27/2023   Essential hypertension 10/13/2007   GERD (gastroesophageal reflux disease) 02/19/2012   Hypercalcemia 03/15/2019   Hyperlipidemia 11/11/2020   Localized edema 01/22/2017   Major depressive disorder 11/24/2018   Melena 05/06/2023   Menopausal syndrome 10/13/2007   Osteoporosis 10/13/2007   Polycythemia 12/03/2010   Primary hyperparathyroidism 06/12/2013   Type II diabetes mellitus 10/13/2007   Past Surgical History:  Procedure Laterality Date   ABDOMINAL HYSTERECTOMY     BIOPSY  05/08/2023   Procedure: BIOPSY;  Surgeon: Lynann Bologna, MD;  Location: [facility];  Service: Gastroenterology;;   BREAST BIOPSY  08/2002   ESOPHAGOGASTRODUODENOSCOPY N/A 05/08/2023   Procedure: ESOPHAGOGASTRODUODENOSCOPY (EGD);  Surgeon: Lynann Bologna, MD;  Location: [facility];  Service: Gastroenterology;  Laterality: N/A;   TONSILLECTOMY     UPPER GASTROINTESTINAL ENDOSCOPY     Patient Active Problem List   Diagnosis Date Noted   Proteinuria 09/09/2023   IDA (iron deficiency anemia) 09/09/2023   Hematuria 07/09/2023   Dementia likely due to Alzheimer's disease 07/06/2023   Dysuria  05/27/2023   Hyperlipidemia 11/11/2020   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   Abnormal liver enzymes 09/19/2019   Blurred vision 09/19/2019   Hypercalcemia 03/15/2019   Major depressive disorder 11/24/2018   Primary hyperparathyroidism 06/12/2013   GERD (gastroesophageal reflux disease) 02/19/2012   Polycythemia 12/03/2010   Type II diabetes mellitus 10/13/2007   Essential hypertension 10/13/2007   Osteoporosis 10/13/2007   Menopausal syndrome 10/13/2007    PCP: Elenore Paddy, NP  REFERRING PROVIDER:  [name], PA-C  REFERRING DIAG: G20.C (ICD-10-CM) - Parkinsonism, unspecified Parkinsonism type (HCC) R42 (ICD-10-CM) - Vertigo  THERAPY DIAG:  Dizziness and giddiness  Other abnormalities of gait and mobility  Other symptoms and signs involving the nervous system  Abnormal posture  Muscle weakness (generalized)  ONSET DATE: 08/10/2023 (date of referral)  Rationale for Evaluation and Treatment: Rehabilitation  SUBJECTIVE:   SUBJECTIVE STATEMENT: Pt previously seen for outpatient ortho PT. Transferred here due to vertigo for vestibular eval. Gets dizzy a lot. Usually when she is lying down and she goes to sit up. Reports she has had it forever. Has never received any treatment for her vertigo before. Sleeps flat on her back. Notes spinning, has it everytime when she gets up. Reports this is the only thing that makes her dizzy. Reports doing exercises from physical therapy. Does not use any AD to help her walk.   Pt accompanied by:  Wendy Poet   PERTINENT HISTORY: PMH: HTN, GERD, DM, osteoporosis, parkinsonism   Pt underwent repeat Neuropsychological testing with  Dr. 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 No  Vitals:   09/14/23 0900  BP: 108/74  Pulse: 71     PRECAUTIONS: Fall and Other: Osteoporosis  WEIGHT BEARING RESTRICTIONS: No  FALLS: Has  patient fallen in last 6 months? No  LIVING ENVIRONMENT: Lives with:  lives with her husband and her granddaughter, pt's spouse is primary caregiver  Lives in: House/apartment - 2 stories  Stairs: Yes: External: 2 steps; on right going up Has following equipment at home: shower chair and Grab bars  PLOF: Independent with household mobility without device, Needs assistance with ADLs, Needs assistance with transfers, and pt's husband helps with bathing, dressing. Has needed help for at least 5 years  PATIENT GOALS: Wants to not be dizzy and wants to keep moving   OBJECTIVE:  Note: Objective measures were completed at Evaluation unless otherwise noted.  DIAGNOSTIC FINDINGS:  05/31/23 Brain MRI "IMPRESSION: 1. No evidence of an acute intracranial abnormality. 2. Mild chronic small vessel ischemic changes within the cerebral white matter, similar to the prior brain MRI of 07/22/2020. 3. There are a few chronic microhemorrhages within the supratentorial brain, as described and increased in number from the prior MRI. 4. Mild generalized cerebral atrophy."  COGNITION: Overall cognitive status: Hx of cognitive impairments/dementia per chart review     MUSCLE TONE:  Rigidity    POSTURE:  rounded shoulders, forward head, and increased thoracic kyphosis  RUE>LUE held into elbow flexion   Cervical ROM:    Limited cervical AROM in rotation and in extension    STRENGTH: Not formally assessed, but decr functional strength with sit <> stands    BED MOBILITY:  Pt reports her husband helps her get in and out of the bed.   During session with positional testing, pt needing to provide mod A with sidelying <> sit  TRANSFERS: Assistive device utilized: None  Sit to stand: Min A Stand to sit: CGA From lower mat table and chair, needing cues for incr forward lean and BUE support to stand  GAIT: Gait pattern: decreased arm swing- Right, decreased arm swing- Left, decreased step length-  Right, decreased step length- Left, Right foot flat, Left foot flat, shuffling, decreased trunk rotation, and trunk flexed Distance walked: Clinic distances  Assistive device utilized: None Level of assistance: CGA Comments: Pt does not use an AD, discussed trialing a RW in future sessions for safety with pt in agreement with trying  VESTIBULAR ASSESSMENT:  GENERAL OBSERVATION: Ambulates in with no AD- shuffled steps, bradykinetic, incr forward flexed posture   SYMPTOM BEHAVIOR:  Subjective history: See above, pt with vertigo >15 years   Non-Vestibular symptoms:  N/A  Type of dizziness: Spinning/Vertigo  Frequency: Daily when getting out of bed   Duration: Few minutes   Aggravating factors: Induced by position change: supine to sit  Relieving factors:  waiting for it to calm down   Progression of symptoms: unchanged  OCULOMOTOR EXAM:  Ocular Alignment: normal  Ocular ROM: No Limitations  Spontaneous Nystagmus: absent  Gaze-Induced Nystagmus: absent  Smooth Pursuits: intact and a little jumpy at times, cues to keep head still  Saccades: hypometric/undershoots  VESTIBULAR - OCULAR REFLEX:   Slow VOR: Normal  VOR Cancellation: Normal  Head-Impulse Test: HIT Right: unable to accurately assess due to guarding HIT Left: unable to accurately assess due to guarding Pt reports feeling dizzy and uncomfortable  POSITIONAL TESTING: Right Sidelying: performed 2 reps, performed on 2 pillows, first assessment pt with incr dizziness and needing to close her eyes, 2nd attempt no nystagmus noted with pt reporting less dizziness, still needing to initially close her eyes   Left Sidelying: performed 2 reps, first rep performed with single pillow and due to limited cervical ROM, pt unable to rest head on pillow, pt reporting feeling dizzy and closing her eyes, therapist unable to open pt's eyes to see if there was any nystagmus. With 2nd re-assessment performed with 2nd pillow with pt able to rest  head on pillow, pt reporting less dizziness and still closing her eyes, no nystagmus noted   Due to cognition, pt difficulty with explaining dizziness/vertigo   VESTIBULAR TREATMENT:                                                                                                   N/A during eval  PATIENT EDUCATION: Education details: Clinical findings, POC, also adding appts to work on Parkinsonism deficits, added Austin Miles to HEP for habituation/possible BPPV Person educated: Patient and pt's granddaughter  Education method: Explanation, Demonstration, Verbal cues, and Handouts Education comprehension: verbalized understanding, returned demonstration, and verbal cues required  HOME EXERCISE PROGRAM: Access Code: [email or web address] URL: [email or web address] Date: 09/14/2023 Prepared by: Sherlie Ban  Exercises - Brandt-Daroff Vestibular Exercise  - 2 x daily - 7 x weekly - 1 sets - 3-4 reps  GOALS: Goals reviewed with patient? Yes  SHORT TERM GOALS: ALL STGS = LTGS   LONG TERM GOALS: Target date: 10/12/2023   Pt will be independent with final HEP with family supervision for vertigo/Parkinsonism in order to build upon functional gains made in therapy.  Baseline:  Goal status: INITIAL  2.  TUG to be assessed with goal written Baseline:  Goal status: INITIAL  3.  Pt will report 3/10 or less dizziness with bed mobility.  Baseline:  Goal status: INITIAL  4.  5x sit <> stand to be assessed with goal written. Baseline:  Goal status: INITIAL  5.  Gait speed to be assessed with goal written.  Baseline:  Goal status: INITIAL    ASSESSMENT:  CLINICAL IMPRESSION: Patient is a 77 year old female referred to Neuro OPPT for Vertigo/Parkinsonism. Pt previously seen at outpatient ortho location for 9 visits (July through Sept), but referred to neuro location due vertigo. Pt's PMH is significant for: HTN, GERD, DM, osteoporosis, parkinsonism, major cognitive  disorder. Pt reports vertigo when lying down for >15 years. The following deficits were present during the exam: gait abnormalities, shuffled gait, bradykinesia, impaired posture, impaired balance, decr functional strength, decr AROM. Pt needing mod A with bed mobility with sidelying > sit.  Pt with abnormal saccades with undershooting and pt with dizziness with VOR testing (unable to get accurate HIT test). Attempted to assess positional testing into sidelying, but pt unable to keep head rotated and needed 2 pillows due to significantly decr cervical ROM. Unable to accurately assess due to pt closing her eyes. Assessed twice with pt reporting not feeling as dizzy during 2nd assessment. Provided  Francee Piccolo Daroff exercises for pt to perform at home with family assist to see if habituation will help sx. Will perform further outcome measures at next session. Pt will also benefit from trial of RW for gait/decr fall risk. Pt would benefit from skilled PT to address these impairments and functional limitations to maximize functional mobility independence and decr dizziness.  .   OBJECTIVE IMPAIRMENTS: Abnormal gait, decreased activity tolerance, decreased balance, decreased cognition, decreased coordination, decreased endurance, decreased knowledge of use of DME, decreased mobility, difficulty walking, decreased strength, dizziness, hypomobility, impaired flexibility, and postural dysfunction.   ACTIVITY LIMITATIONS: carrying, lifting, bending, stairs, transfers, bed mobility, bathing, toileting, dressing, hygiene/grooming, locomotion level, and caring for others  PARTICIPATION LIMITATIONS: cleaning, laundry, driving, shopping, and community activity  PERSONAL FACTORS: Age, Behavior pattern, Past/current experiences, Time since onset of injury/illness/exacerbation, and 3+ comorbidities: HTN, GERD, DM, osteoporosis, parkinsonism, major cognitive disorder  are also affecting patient's functional outcome.   REHAB  POTENTIAL: Fair due to chronicity of condition/vertigo, pt with cognitive impairments  CLINICAL DECISION MAKING: Evolving/moderate complexity  EVALUATION COMPLEXITY: Moderate   PLAN:  PT FREQUENCY: 2x/week  PT DURATION: 8 weeks - anticipate just 4 weeks  PLANNED INTERVENTIONS: 97164- PT Re-evaluation, 97110-Therapeutic exercises, 97530- Therapeutic activity, [id number]- Neuromuscular re-education, 97535- Self Care, [id number]- Manual therapy, [id number]- Gait training, [phone]- Canalith repositioning, Patient/Family education, Balance training, Vestibular training, and DME instructions  PLAN FOR NEXT SESSION: How were brandt daroff exercises? Try gentle VOR if pt can tolerate. Finish MSQ. Work on [employer] specific exercises - sit <> stands, try gait with RW   Assess 5x sit <> stand, TUG, gait speed and add to goals   Drake Leach, PT, DPT 09/14/2023, 9:29 AM

## 2023-09-15 ENCOUNTER — Encounter: Payer: Self-pay | Admitting: Nurse Practitioner

## 2023-09-16 ENCOUNTER — Other Ambulatory Visit: Payer: Self-pay | Admitting: Nurse Practitioner

## 2023-09-16 DIAGNOSIS — R809 Proteinuria, unspecified: Secondary | ICD-10-CM

## 2023-09-18 ENCOUNTER — Emergency Department (HOSPITAL_COMMUNITY): Payer: Medicare Other

## 2023-09-18 ENCOUNTER — Other Ambulatory Visit: Payer: Self-pay

## 2023-09-18 ENCOUNTER — Encounter (HOSPITAL_COMMUNITY): Payer: Self-pay | Admitting: Emergency Medicine

## 2023-09-18 ENCOUNTER — Emergency Department (HOSPITAL_COMMUNITY)
Admission: EM | Admit: 2023-09-18 | Discharge: 2023-09-18 | Disposition: A | Discharge status: Home / Self Care | Payer: Medicare Other | Attending: Emergency Medicine | Admitting: Emergency Medicine

## 2023-09-18 DIAGNOSIS — K760 Fatty (change of) liver, not elsewhere classified: Secondary | ICD-10-CM | POA: Insufficient documentation

## 2023-09-18 DIAGNOSIS — K5790 Diverticulosis of intestine, part unspecified, without perforation or abscess without bleeding: Secondary | ICD-10-CM | POA: Insufficient documentation

## 2023-09-18 DIAGNOSIS — F0390 Unspecified dementia without behavioral disturbance: Secondary | ICD-10-CM | POA: Diagnosis not present

## 2023-09-18 DIAGNOSIS — J181 Lobar pneumonia, unspecified organism: Secondary | ICD-10-CM | POA: Insufficient documentation

## 2023-09-18 DIAGNOSIS — K449 Diaphragmatic hernia without obstruction or gangrene: Secondary | ICD-10-CM | POA: Insufficient documentation

## 2023-09-18 DIAGNOSIS — J189 Pneumonia, unspecified organism: Secondary | ICD-10-CM

## 2023-09-18 DIAGNOSIS — R531 Weakness: Secondary | ICD-10-CM | POA: Diagnosis present

## 2023-09-18 LAB — CBC
HCT: 37.3 % (ref 36.0–46.0)
Hemoglobin: 11.3 g/dL — ABNORMAL LOW (ref 12.0–15.0)
MCH: 23.5 pg — ABNORMAL LOW (ref 26.0–34.0)
MCHC: 30.3 g/dL (ref 30.0–36.0)
MCV: 77.7 fL — ABNORMAL LOW (ref 80.0–100.0)
Platelets: 218 10*3/uL (ref 150–400)
RBC: 4.8 MIL/uL (ref 3.87–5.11)
RDW: 18.4 % — ABNORMAL HIGH (ref 11.5–15.5)
WBC: 12.7 10*3/uL — ABNORMAL HIGH (ref 4.0–10.5)
nRBC: 0 % (ref 0.0–0.2)

## 2023-09-18 LAB — HEPATIC FUNCTION PANEL
ALT: 27 U/L (ref 0–44)
AST: 29 U/L (ref 15–41)
Albumin: 3.5 g/dL (ref 3.5–5.0)
Alkaline Phosphatase: 67 U/L (ref 38–126)
Bilirubin, Direct: 0.2 mg/dL (ref 0.0–0.2)
Indirect Bilirubin: 0.6 mg/dL (ref 0.3–0.9)
Total Bilirubin: 0.8 mg/dL (ref 0.3–1.2)
Total Protein: 7.3 g/dL (ref 6.5–8.1)

## 2023-09-18 LAB — BASIC METABOLIC PANEL
Anion gap: 9 (ref 5–15)
BUN: 11 mg/dL (ref 8–23)
CO2: 24 mmol/L (ref 22–32)
Calcium: 10.2 mg/dL (ref 8.9–10.3)
Chloride: 107 mmol/L (ref 98–111)
Creatinine, Ser: 1.01 mg/dL — ABNORMAL HIGH (ref 0.44–1.00)
GFR, Estimated: 57 mL/min — ABNORMAL LOW (ref >60)
Glucose, Bld: 167 mg/dL — ABNORMAL HIGH (ref 70–99)
Potassium: 4.1 mmol/L (ref 3.5–5.1)
Sodium: 140 mmol/L (ref 135–145)

## 2023-09-18 LAB — CBG MONITORING, ED
Glucose-Capillary: 148 mg/dL — ABNORMAL HIGH (ref 70–99)
Glucose-Capillary: 173 mg/dL — ABNORMAL HIGH (ref 70–99)

## 2023-09-18 LAB — LIPASE, BLOOD: Lipase: 30 U/L (ref 11–51)

## 2023-09-18 MED ORDER — ONDANSETRON 4 MG PO TBDP: ORAL_TABLET | ORAL | 0 refills | Status: DC

## 2023-09-18 MED ORDER — IOHEXOL 300 MG/ML  SOLN
100.0000 mL | Freq: Once | INTRAMUSCULAR | Status: AC | PRN
  Administered 2023-09-18: 100 mL via INTRAVENOUS

## 2023-09-18 MED ORDER — DOXYCYCLINE HYCLATE 100 MG PO CAPS: 100.0000 mg | ORAL_CAPSULE | Freq: Two times a day (BID) | ORAL | 0 refills | Status: DC

## 2023-09-18 NOTE — ED Provider Notes (Signed)
[place] EMERGENCY DEPARTMENT AT [facility] Provider Note   CSN: [id number] Arrival date & time: 09/18/23  1357     History {Add pertinent medical, surgical, social history, OB history to HPI:1} Chief Complaint  Patient presents with   Weakness    Brandi Walsh is a 77 y.o. female.  Patient has a history of dementia.  Granddaughter states that the patient has thrown up a couple times and has been weaker than normal.   Weakness      Home Medications Prior to Admission medications   Medication Sig Start Date End Date Taking? Authorizing Provider  doxycycline (VIBRAMYCIN) 100 MG capsule Take 1 capsule (100 mg total) by mouth 2 (two) times daily. One po bid x 7 days 09/18/23  Yes Bethann Berkshire, MD  ondansetron (ZOFRAN-ODT) 4 MG disintegrating tablet 4mg  ODT q4 hours prn nausea/vomit 09/18/23  Yes Bethann Berkshire, MD  alendronate (FOSAMAX) 70 MG tablet Take 70 mg by mouth once a week. 03/31/21   [provider]  amoxicillin (AMOXIL) 500 MG capsule Take 1 capsule (500 mg total) by mouth 3 (three) times daily for 7 days. 09/14/23 09/21/23  Worthy Rancher B, FNP  Cholecalciferol (VITAMIN D) 50 MCG (2000 UT) tablet Take 2,000 Units by mouth daily.    [provider]  donepezil (ARICEPT) 5 MG tablet Take 1 tablet daily 09/07/23   Van Clines, MD  escitalopram (LEXAPRO) 10 MG tablet TAKE 1 & 1/2 (ONE & ONE-HALF) TABLETS BY MOUTH ONCE DAILY Patient taking differently: Take 15 mg by mouth daily. 12/02/22   [name], PMHNP  losartan (COZAAR) 50 MG tablet Take 1 tablet (50 mg total) by mouth daily. 10/06/22   [name], FNP  mupirocin cream (BACTROBAN) 2 % Apply 1 Application topically 2 (two) times daily. 09/28/22   [name], DPM  pantoprazole (PROTONIX) 40 MG tablet Take 40 mg by mouth every other day.    [provider]  rosuvastatin (CRESTOR) 40 MG tablet Take 1 tablet (40 mg total) by mouth daily. 10/06/22   [name], FNP      Allergies    Lovastatin    Review of Systems   Review of Systems  Neurological:  Positive for weakness.    Physical Exam Updated Vital Signs BP (!) 120/54   Pulse 75   Temp 99.4 F (37.4 C) (Oral)   Resp 14   SpO2 95%  Physical Exam  ED Results / Procedures / Treatments   Labs (all labs ordered are listed, but only abnormal results are displayed) Labs Reviewed  BASIC METABOLIC PANEL - Abnormal; Notable for the following components:      Result Value   Glucose, Bld 167 (*)    Creatinine, Ser 1.01 (*)    GFR, Estimated 57 (*)    All other components within normal limits  CBC - Abnormal; Notable for the following components:   WBC 12.7 (*)    Hemoglobin 11.3 (*)    MCV 77.7 (*)    MCH 23.5 (*)    RDW 18.4 (*)    All other components within normal limits  CBG MONITORING, ED - Abnormal; Notable for the following components:   Glucose-Capillary 148 (*)    All other components within normal limits  CBG MONITORING, ED - Abnormal; Notable for the following components:   Glucose-Capillary 173 (*)    All other components within normal limits  HEPATIC FUNCTION PANEL  LIPASE, BLOOD  URINALYSIS, ROUTINE W REFLEX  MICROSCOPIC    EKG EKG Interpretation Date/Time:  Saturday September 18 2023 15:54:31 EDT Ventricular Rate:  79 PR Interval:  169 QRS Duration:  79 QT Interval:  360 QTC Calculation: 413 R Axis:   72  Text Interpretation: Sinus rhythm Low voltage with right axis deviation Confirmed by Bethann Berkshire [id number]) on 09/18/2023 7:23:31 PM  Radiology CT ABDOMEN PELVIS W CONTRAST  Result Date: 09/18/2023 CLINICAL DATA:  Acute abdominal pain with nausea and vomiting EXAM: CT ABDOMEN AND PELVIS WITH CONTRAST TECHNIQUE: Multidetector CT imaging of the abdomen and pelvis was performed using the standard protocol following bolus administration of intravenous contrast. RADIATION DOSE REDUCTION: This exam was performed according to the departmental  dose-optimization program which includes automated exposure control, adjustment of the mA and/or kV according to patient size and/or use of iterative reconstruction technique. CONTRAST:  OMNIPAQUE IOHEXOL 300 MG/ML  SOLN COMPARISON:  None Available. FINDINGS: Lower chest: Lung bases are well aerated with mild patchy infiltrate in the right middle and right lower lobe. No sizable effusion is seen. Hepatobiliary: Fatty infiltration of the liver is noted. The gallbladder is within normal limits. Pancreas: Unremarkable. No pancreatic ductal dilatation or surrounding inflammatory changes. Spleen: Normal in size without focal abnormality. Adrenals/Urinary Tract: Adrenal glands are within normal limits. Kidneys demonstrate a normal enhancement pattern bilaterally. Simple renal cyst is noted in the lower pole of the left kidney. No follow-up is recommended. No obstructive changes seen. The ureters are within normal limits. Bladder is decompressed. Stomach/Bowel: Mild retained fecal material is noted within the colon. Scattered diverticular changes noted without evidence of diverticulitis. The appendix is well visualized without inflammatory change. Small bowel and stomach are within normal limits with the exception of a moderate-sized sliding-type hiatal hernia. Vascular/Lymphatic: Aortic atherosclerosis. No enlarged abdominal or pelvic lymph nodes. Left retroaortic renal vein is noted. Right ovarian vein phleboliths are noted. Reproductive: Status post hysterectomy. No adnexal masses. Other: Small fat containing umbilical hernia is noted. No abdominopelvic ascites. Musculoskeletal: Degenerative changes of the lumbar spine. IMPRESSION: Patchy right middle and right lower lobe infiltrate. Fatty infiltration of the liver is noted. Diverticulosis without diverticulitis. Hiatal hernia. Electronically Signed   By: Alcide Clever M.D.   On: 09/18/2023 18:44    Procedures Procedures  {Document cardiac monitor, telemetry  assessment procedure when appropriate:1}  Medications Ordered in ED Medications  iohexol (OMNIPAQUE) 300 MG/ML solution 100 mL (100 mLs Intravenous Contrast Given 09/18/23 1620)    ED Course/ Medical Decision Making/ A&P  Patient has many acquired pneumonia in the right lower lobe and right middle lobe.  She is not tachycardic she is not toxic not hypoxic {Patient with community acquired pneumonia Click here for ABCD2, HEART and other calculatorsREFRESH Note before signing :1}                              Medical Decision Making Amount and/or Complexity of Data Reviewed Labs: ordered. Radiology: ordered.  Risk Prescription drug management.   Patient with community-acquired pneumonia she will be started on doxycycline and given Zofran and will follow-up with the PCP  {Document critical care time when appropriate:1} {Document review of labs and clinical decision tools ie heart score, Chads2Vasc2 etc:1}  {Document your independent review of radiology images, and any outside records:1} {Document your discussion with family members, caretakers, and with consultants:1} {Document social determinants of health affecting pt's care:1} {Document your decision making why or why not admission, treatments were needed:1}  Final Clinical Impression(s) / ED Diagnoses Final diagnoses:  Community acquired pneumonia of right middle lobe of lung    Rx / DC Orders ED Discharge Orders          Ordered    doxycycline (VIBRAMYCIN) 100 MG capsule  2 times daily        09/18/23 1931    ondansetron (ZOFRAN-ODT) 4 MG disintegrating tablet        09/18/23 1931

## 2023-09-18 NOTE — ED Triage Notes (Signed)
Pt BIB EMS from home, c/o increased weakness, nausea/vomit x2. Pt has hx of dementia but per family pt is more confused than normal. Lost balanced and family lowered to ground.  BP 100/56 P 78 SpO2 99% CBG 186

## 2023-09-18 NOTE — ED Notes (Signed)
As RN was about to do an I&O the pt urinated in the bed

## 2023-09-18 NOTE — Discharge Instructions (Signed)
Drink plenty of fluids.  Follow-up with your doctor next week for recheck.  Return sooner if problems

## 2023-09-18 NOTE — ED Notes (Signed)
Pt cleaned up and linen changed. Pt given warm blankets

## 2023-09-20 ENCOUNTER — Ambulatory Visit: Payer: Medicare Other | Admitting: Physical Therapy

## 2023-09-20 ENCOUNTER — Encounter: Payer: Self-pay | Admitting: Physical Therapy

## 2023-09-20 VITALS — BP 115/75 | HR 84

## 2023-09-20 DIAGNOSIS — R2689 Other abnormalities of gait and mobility: Secondary | ICD-10-CM

## 2023-09-20 DIAGNOSIS — R42 Dizziness and giddiness: Secondary | ICD-10-CM

## 2023-09-20 DIAGNOSIS — R293 Abnormal posture: Secondary | ICD-10-CM

## 2023-09-20 DIAGNOSIS — R29818 Other symptoms and signs involving the nervous system: Secondary | ICD-10-CM

## 2023-09-20 NOTE — Therapy (Signed)
OUTPATIENT PHYSICAL THERAPY VESTIBULAR TREATMENT     Patient Name: Brandi Walsh MRN: [id number] DOB:Jan 02, 1946, 77 y.o., female Today's Date: 09/20/2023  END OF SESSION:  PT End of Session - 09/20/23 0853     Visit Number 2    Number of Visits 9    Date for PT Re-Evaluation 11/13/23    Authorization Type [employer] medicare    PT Start Time [phone]   pt late to session   PT Stop Time 0930    PT Time Calculation (min) 39 min    Equipment Utilized During Treatment Gait belt    Activity Tolerance Patient tolerated treatment well    Behavior During Therapy WFL for tasks assessed/performed             Past Medical History:  Diagnosis Date   Abnormal liver enzymes 09/19/2019   Blurred vision 09/19/2019   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   Conjunctivitis of both eyes 05/27/2023   Dementia likely due to Alzheimer's disease 07/06/2023   Dysuria 05/27/2023   Essential hypertension 10/13/2007   GERD (gastroesophageal reflux disease) 02/19/2012   Hypercalcemia 03/15/2019   Hyperlipidemia 11/11/2020   Localized edema 01/22/2017   Major depressive disorder 11/24/2018   Melena 05/06/2023   Menopausal syndrome 10/13/2007   Osteoporosis 10/13/2007   Polycythemia 12/03/2010   Primary hyperparathyroidism 06/12/2013   Type II diabetes mellitus 10/13/2007   Past Surgical History:  Procedure Laterality Date   ABDOMINAL HYSTERECTOMY     BIOPSY  05/08/2023   Procedure: BIOPSY;  Surgeon: Lynann Bologna, MD;  Location: [facility];  Service: Gastroenterology;;   BREAST BIOPSY  08/2002   ESOPHAGOGASTRODUODENOSCOPY N/A 05/08/2023   Procedure: ESOPHAGOGASTRODUODENOSCOPY (EGD);  Surgeon: Lynann Bologna, MD;  Location: [facility];  Service: Gastroenterology;  Laterality: N/A;   TONSILLECTOMY     UPPER GASTROINTESTINAL ENDOSCOPY     Patient Active Problem List   Diagnosis Date Noted   Proteinuria 09/09/2023   IDA (iron deficiency anemia) 09/09/2023   Hematuria 07/09/2023   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's disease 07/06/2023   Dysuria 05/27/2023   Hyperlipidemia 11/11/2020   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   Abnormal liver enzymes 09/19/2019   Blurred vision 09/19/2019   Hypercalcemia 03/15/2019   Major depressive disorder 11/24/2018   Primary hyperparathyroidism 06/12/2013   GERD (gastroesophageal reflux disease) 02/19/2012   Polycythemia 12/03/2010   Type II diabetes mellitus 10/13/2007   Essential hypertension 10/13/2007   Osteoporosis 10/13/2007   Menopausal syndrome 10/13/2007    PCP: Elenore Paddy, NP  REFERRING PROVIDER:  [name], PA-C  REFERRING DIAG: G20.C (ICD-10-CM) - Parkinsonism, unspecified Parkinsonism type (HCC) R42 (ICD-10-CM) - Vertigo  THERAPY DIAG:  Dizziness and giddiness  Other abnormalities of gait and mobility  Other symptoms and signs involving the nervous system  Abnormal posture  ONSET DATE: 08/10/2023 (date of referral)  Rationale for Evaluation and Treatment: Rehabilitation  SUBJECTIVE:   SUBJECTIVE STATEMENT: Went to the ER over the weekend and was found to have pneumonia. Was prescribed medications. Feeling better today. No falls. Tried the [employer] exercise at home and is feeling less dizzy. Reports "not much" when asked when she is getting dizzy.   Pt accompanied by:  Brandi Walsh  PERTINENT HISTORY: PMH: HTN, GERD, DM, osteoporosis, parkinsonism   Pt underwent repeat Neuropsychological testing with Dr. 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 No  Vitals:   09/20/23 0856  BP: 115/75  Pulse: 84  SpO2: 100%      PRECAUTIONS: Fall and Other: Osteoporosis  WEIGHT BEARING RESTRICTIONS: No  FALLS: Has patient fallen in last 6 months? No  LIVING ENVIRONMENT: Lives with:  lives with her husband and her granddaughter, pt's spouse is primary caregiver  Lives in: House/apartment - 2 stories  Stairs: Yes:  External: 2 steps; on right going up Has following equipment at home: shower chair and Grab bars  PLOF: Independent with household mobility without device, Needs assistance with ADLs, Needs assistance with transfers, and pt's husband helps with bathing, dressing. Has needed help for at least 5 years  PATIENT GOALS: Wants to not be dizzy and wants to keep moving   OBJECTIVE:  Note: Objective measures were completed at Evaluation unless otherwise noted.  DIAGNOSTIC FINDINGS:  05/31/23 Brain MRI "IMPRESSION: 1. No evidence of an acute intracranial abnormality. 2. Mild chronic small vessel ischemic changes within the cerebral white matter, similar to the prior brain MRI of 07/22/2020. 3. There are a few chronic microhemorrhages within the supratentorial brain, as described and increased in number from the prior MRI. 4. Mild generalized cerebral atrophy."  COGNITION: Overall cognitive status: Hx of cognitive impairments/dementia per chart review     MUSCLE TONE:  Rigidity    POSTURE:  rounded shoulders, forward head, and increased thoracic kyphosis  RUE>LUE held into elbow flexion   Cervical ROM:    Limited cervical AROM in rotation and in extension    STRENGTH: Not formally assessed, but decr functional strength with sit <> stands    BED MOBILITY:  Pt reports her husband helps her get in and out of the bed.   During session with positional testing, pt needing to provide mod A with sidelying <> sit    VESTIBULAR ASSESSMENT:  GENERAL OBSERVATION: Ambulates in with no AD- shuffled steps, bradykinetic, incr forward flexed posture   SYMPTOM BEHAVIOR:  Subjective history: See above, pt with vertigo >15 years   Non-Vestibular symptoms:  N/A  Type of dizziness: Spinning/Vertigo  Frequency: Daily when getting out of bed   Duration: Few minutes   Aggravating factors: Induced by position change: supine to sit  Relieving factors:  waiting for it to calm down   Progression of  symptoms: unchanged  OCULOMOTOR EXAM:  Ocular Alignment: normal  Ocular ROM: No Limitations  Spontaneous Nystagmus: absent  Gaze-Induced Nystagmus: absent  Smooth Pursuits: intact and a little jumpy at times, cues to keep head still  Saccades: hypometric/undershoots  VESTIBULAR - OCULAR REFLEX:   Slow VOR: Normal  VOR Cancellation: Normal  Head-Impulse Test: HIT Right: unable to accurately assess due to guarding HIT Left: unable to accurately assess due to guarding Pt reports feeling dizzy and uncomfortable     POSITIONAL TESTING: Right Sidelying: performed 2 reps, performed on 2 pillows, first assessment pt with incr dizziness and needing to close her eyes, 2nd attempt no nystagmus noted with pt reporting less dizziness, still needing to initially close her eyes   Left Sidelying: performed 2 reps, first rep performed with single pillow and due to limited cervical ROM, pt unable to rest head on pillow, pt reporting feeling dizzy and closing her eyes, therapist unable to open pt's eyes to see if there was any nystagmus. With 2nd re-assessment performed with 2nd pillow with pt able to rest head on pillow, pt reporting less dizziness and still closing her eyes, no nystagmus noted   Due to cognition, pt difficulty with explaining dizziness/vertigo   VESTIBULAR TREATMENT:  Therapeutic Activity: Goal Assessment: 5x sit <> stand: 18.9 seconds with no UE support TUG: 21.2 seconds with no AD Gait speed: 24.78 seconds with no AD = 1.32 ft/sec   TRANSFERS: Assistive device utilized: None  Sit to stand: CGA Stand to sit: CGA From mat table and chair, cues to scoot out towards edge and have incr forward lean to help come to stand. Pt able to perform on her own and takes incr time. Educated pt and pt's husband to try to let pt try on her own to stand with incr forward lean even if it takes her  longer to use those muscles instead of just pulling pt up by her arm   GAIT: Gait pattern: decreased arm swing- Right, decreased arm swing- Left, decreased step length- Right, decreased step length- Left, Right foot flat, Left foot flat, shuffling, decreased trunk rotation, trunk flexed, and narrow BOS Distance walked: 30' with RW, pt ambulates in and out of clinic with no AD  Assistive device utilized: 2 wheeled RW, no AD Level of assistance: Supervision with RW, CGA with no AD  Comments: Pt ambulates into session with no AD with forward flexed posture, narrow BOS, shuffled steps and very decr stride length, pt also holds BUE into flexion  Trialed gait with RW for 115' with pt able to ambulate with supervision, with cues for stride length, staying inside RW and pt also able to demo upright posture. Intermittent cues to stay inside RW. Pt requesting to sit and rest after 115'. Educated on using one for home, but pt reports initially that she does not need one    NMR:  Pt performs PWR! Moves in sitting position x 10 reps   PWR! Up for improved posture  PWR! Rock for improved weight shifting, cues to look at hand   PWR! Step for improved step initiation - single step in and out, cues for incr step height   Cues provided for technique, larger amplitude movements. Pt reporting RPE as 10/10     PATIENT EDUCATION: Education details: Discussed fall risk based on outcome measures and use of 2 wheeled RW to decr fall risk/improve gait and step length and PT recommending use of RW for home and community distances. Pt reporting that she would not want to use it. Pt's husband they have a RW at home. Discussed at least trying to use it for community distances/open spaces. Additions to HEP for seated PWR moves for large amplitude movements, continue [employer] exercises as they are helping with pt's dizziness  Person educated: Patient and pt's husband Education method: Explanation, Demonstration,  Verbal cues, and Handouts Education comprehension: verbalized understanding, returned demonstration, and verbal cues required  HOME EXERCISE PROGRAM: Access Code: [email or web address] URL: [email or web address] Date: 09/14/2023 Prepared by: Sherlie Ban  Exercises - Brandt-Daroff Vestibular Exercise  - 2 x daily - 7 x weekly - 1 sets - 3-4 reps  Seated PWR Up, Rock, Step  See ortho PT notes for HEP given   GOALS: Goals reviewed with patient? Yes  SHORT TERM GOALS: ALL STGS = LTGS   LONG TERM GOALS: Target date: 10/12/2023   Pt will be independent with final HEP with family supervision for vertigo/Parkinsonism in order to build upon functional gains made in therapy.  Baseline:  Goal status: INITIAL  2.  Pt will improve TUG time to 18 seconds or less in order to demo decrease fall risk.  Baseline: 21.2 seconds with no AD Goal status: INITIAL  3.  Pt will report  3/10 or less dizziness with bed mobility.  Baseline:  Goal status: INITIAL  4. Pt will improve 5x sit<>stand to less than or equal to 16.5 sec to demonstrate improved functional strength and transfer efficiency.  Baseline: 18.9 seconds with no UE support Goal status: INITIAL  5.  Pt will improve gait speed with LRAD to at least 1.6 ft/sec in order to demo improved household mobility/decr fall risk.  Baseline: 24.78 seconds with no AD = 1.32 ft/sec Goal status: INITIAL    ASSESSMENT:  CLINICAL IMPRESSION: Pt reports [employer] exercises for home are helping with her dizziness. Assessed gait speed, 5x sit <> stand, and TUG with no AD, indicating pt is at a high risk for falls. LTGs updated. Trialed using a RW to decr fall risk, with pt taking incr step length and demonstrated more upright posture and independence with gait. Educated using one for home, but pt not wanting to use one. Remainder of session focused on seated PWR moves for larger amplitude movements and mobility. Provided to HEP. Will  continue per POC. .   OBJECTIVE IMPAIRMENTS: Abnormal gait, decreased activity tolerance, decreased balance, decreased cognition, decreased coordination, decreased endurance, decreased knowledge of use of DME, decreased mobility, difficulty walking, decreased strength, dizziness, hypomobility, impaired flexibility, and postural dysfunction.   ACTIVITY LIMITATIONS: carrying, lifting, bending, stairs, transfers, bed mobility, bathing, toileting, dressing, hygiene/grooming, locomotion level, and caring for others  PARTICIPATION LIMITATIONS: cleaning, laundry, driving, shopping, and community activity  PERSONAL FACTORS: Age, Behavior pattern, Past/current experiences, Time since onset of injury/illness/exacerbation, and 3+ comorbidities: HTN, GERD, DM, osteoporosis, parkinsonism, major cognitive disorder  are also affecting patient's functional outcome.   REHAB POTENTIAL: Fair due to chronicity of condition/vertigo, pt with cognitive impairments  CLINICAL DECISION MAKING: Evolving/moderate complexity  EVALUATION COMPLEXITY: Moderate   PLAN:  PT FREQUENCY: 2x/week  PT DURATION: 8 weeks - anticipate just 4 weeks  PLANNED INTERVENTIONS: 97164- PT Re-evaluation, 97110-Therapeutic exercises, 97530- Therapeutic activity, [id number]- Neuromuscular re-education, 97535- Self Care, 16109- Manual therapy, [id number]- Gait training, [phone]- Canalith repositioning, Patient/Family education, Balance training, Vestibular training, and DME instructions  PLAN FOR NEXT SESSION:Try gentle VOR if pt can tolerate. Work on [employer] specific exercises - sit <> stands, try to work on gait with RW. Work on larger [employer]    Drake Leach, PT, DPT 09/20/2023, 11:47 AM

## 2023-09-24 ENCOUNTER — Ambulatory Visit: Payer: Medicare Other | Admitting: Physical Therapy

## 2023-09-24 ENCOUNTER — Ambulatory Visit (INDEPENDENT_AMBULATORY_CARE_PROVIDER_SITE_OTHER): Payer: Medicare Other | Admitting: Nurse Practitioner

## 2023-09-24 VITALS — BP 118/80 | HR 79

## 2023-09-24 VITALS — BP 108/68 | HR 91 | Temp 97.6°F | Ht 60.0 in | Wt 150.4 lb

## 2023-09-24 DIAGNOSIS — R809 Proteinuria, unspecified: Secondary | ICD-10-CM | POA: Diagnosis not present

## 2023-09-24 DIAGNOSIS — R42 Dizziness and giddiness: Secondary | ICD-10-CM

## 2023-09-24 DIAGNOSIS — J189 Pneumonia, unspecified organism: Secondary | ICD-10-CM | POA: Diagnosis not present

## 2023-09-24 DIAGNOSIS — M6281 Muscle weakness (generalized): Secondary | ICD-10-CM

## 2023-09-24 DIAGNOSIS — R2689 Other abnormalities of gait and mobility: Secondary | ICD-10-CM

## 2023-09-24 DIAGNOSIS — D509 Iron deficiency anemia, unspecified: Secondary | ICD-10-CM | POA: Diagnosis not present

## 2023-09-24 LAB — URINALYSIS, ROUTINE W REFLEX MICROSCOPIC
Bilirubin Urine: NEGATIVE
Hgb urine dipstick: NEGATIVE
Ketones, ur: NEGATIVE
Leukocytes,Ua: NEGATIVE
Nitrite: NEGATIVE
Specific Gravity, Urine: 1.02 (ref 1.000–1.030)
Total Protein, Urine: NEGATIVE
Urine Glucose: NEGATIVE
Urobilinogen, UA: 2 — AB (ref 0.0–1.0)
pH: 6.5 (ref 5.0–8.0)

## 2023-09-24 LAB — CBC
HCT: 37.1 % (ref 36.0–46.0)
Hemoglobin: 11.3 g/dL — ABNORMAL LOW (ref 12.0–15.0)
MCHC: 30.5 g/dL (ref 30.0–36.0)
MCV: 74.9 fL — ABNORMAL LOW (ref 78.0–100.0)
Platelets: 258 10*3/uL (ref 150.0–400.0)
RBC: 4.96 Mil/uL (ref 3.87–5.11)
RDW: 19.9 % — ABNORMAL HIGH (ref 11.5–15.5)
WBC: 9.7 10*3/uL (ref 4.0–10.5)

## 2023-09-24 LAB — BASIC METABOLIC PANEL
BUN: 15 mg/dL (ref 6–23)
CO2: 27 meq/L (ref 19–32)
Calcium: 10.3 mg/dL (ref 8.4–10.5)
Chloride: 105 meq/L (ref 96–112)
Creatinine, Ser: 0.99 mg/dL (ref 0.40–1.20)
GFR: 54.91 mL/min — ABNORMAL LOW (ref >60.00)
Glucose, Bld: 153 mg/dL — ABNORMAL HIGH (ref 70–99)
Potassium: 4.1 meq/L (ref 3.5–5.1)
Sodium: 140 meq/L (ref 135–145)

## 2023-09-24 LAB — IRON: Iron: 30 ug/dL — ABNORMAL LOW (ref 42–145)

## 2023-09-24 LAB — FERRITIN: Ferritin: 14.8 ng/mL (ref 10.0–291.0)

## 2023-09-24 NOTE — Progress Notes (Signed)
Established Patient Office Visit  Subjective   Patient ID: Brandi Walsh, female    DOB: 1946-03-26  Age: 77 y.o. MRN: [id number]  Chief Complaint  Patient presents with   ER follow-up    Patient arrives today for ER follow-up.  She is companied by her granddaughter and husband.  She was seen in the emergency department about 6 days ago and was diagnosed with community-acquired pneumonia.  Currently on doxycycline for this treatment.  Overall patient reports that she is feeling a bit better.  Husband reports that patient did take a longer nap than normal yesterday and granddaughter reports patient had 1 episode of vomiting yesterday.  Overall granddaughter also feels that patient is improving.  Patient denies any shortness of breath, fever or chills, or cough.  Of note we recently reduced her pantoprazole from 40 mg twice daily to 40 mg daily.    ROS: see HPI    Objective:     BP 108/68   Pulse 91   Temp 97.6 F (36.4 C) (Temporal)   Ht 5' (1.524 m)   Wt 150 lb 6 oz (68.2 kg)   SpO2 99%   BMI 29.37 kg/m  BP Readings from Last 3 Encounters:  09/24/23 108/68  09/24/23 118/80  09/20/23 115/75   Wt Readings from Last 3 Encounters:  09/24/23 150 lb 6 oz (68.2 kg)  09/09/23 151 lb 4 oz (68.6 kg)  08/09/23 154 lb (69.9 kg)      Physical Exam Vitals reviewed.  Constitutional:      General: She is not in acute distress.    Appearance: Normal appearance.  HENT:     Head: Normocephalic and atraumatic.  Cardiovascular:     Rate and Rhythm: Normal rate and regular rhythm.     Pulses: Normal pulses.     Heart sounds: Normal heart sounds.  Pulmonary:     Effort: Pulmonary effort is normal.     Breath sounds: Normal breath sounds.  Skin:    General: Skin is warm and dry.  Neurological:     General: No focal deficit present.     Mental Status: She is alert and oriented to person, place, and time.  Psychiatric:        Mood and Affect: Mood normal.        Behavior:  Behavior normal.        Judgment: Judgment normal.      No results found for any visits on 09/24/23.    The ASCVD Risk score (Arnett DK, et al., 2019) failed to calculate for the following reasons:   The valid total cholesterol range is 130 to 320 mg/dL    Assessment & Plan:   Problem List Items Addressed This Visit       Respiratory   Pneumonia due to infectious organism - Primary    Acute, clinically improving, VSS Some fiant crackles noted to right lung field today otherwise lung fields clear.  Patient encouraged to continue taking doxycycline until course has been completed. Follow-up as scheduled, or sooner as needed.      Relevant Orders   Basic metabolic panel     Other   Proteinuria    Labs ordered, further recommendations may be made based upon these results.  As of right now patient does have scheduled appointment with nephrology next week.      IDA (iron deficiency anemia)    Labs ordered, further recommendations may be made based upon the results.  Return for as scheduled.    Elenore Paddy, NP

## 2023-09-24 NOTE — Assessment & Plan Note (Signed)
Labs ordered, further recommendations may be made based upon these results.  As of right now patient does have scheduled appointment with nephrology next week.

## 2023-09-24 NOTE — Assessment & Plan Note (Signed)
Labs ordered, further recommendations may be made based upon the results. 

## 2023-09-24 NOTE — Therapy (Unsigned)
OUTPATIENT PHYSICAL THERAPY VESTIBULAR TREATMENT     Patient Name: Brandi Walsh MRN: [id number] DOB:[birth year], 77 y.o., female Today's Date: 09/27/2023  END OF SESSION:   09/24/23 0851  PT Visits / Re-Eval  Visit Number 3  Number of Visits 9  Date for PT Re-Evaluation 11/13/23  Authorization  Authorization Type [employer] medicare  PT Time Calculation  PT Start Time 0847  PT Stop Time 0930  PT Time Calculation (min) 43 min  PT - End of Session  Equipment Utilized During Treatment Gait belt  Activity Tolerance Patient tolerated treatment well  Behavior During Therapy WFL for tasks assessed/performed     Past Medical History:  Diagnosis Date   Abnormal liver enzymes 09/19/2019   Blurred vision 09/19/2019   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   Conjunctivitis of both eyes 05/27/2023   Dementia likely due to Alzheimer's disease 07/06/2023   Dysuria 05/27/2023   Essential hypertension 10/13/2007   GERD (gastroesophageal reflux disease) 02/19/2012   Hypercalcemia 03/15/2019   Hyperlipidemia 11/11/2020   Localized edema 01/22/2017   Major depressive disorder 11/24/2018   Melena 05/06/2023   Menopausal syndrome 10/13/2007   Osteoporosis 10/13/2007   Polycythemia 12/03/2010   Primary hyperparathyroidism 06/12/2013   Type II diabetes mellitus 10/13/2007   Past Surgical History:  Procedure Laterality Date   ABDOMINAL HYSTERECTOMY     BIOPSY  05/08/2023   Procedure: BIOPSY;  Surgeon: Lynann Bologna, MD;  Location: [facility];  Service: Gastroenterology;;   BREAST BIOPSY  08/2002   ESOPHAGOGASTRODUODENOSCOPY N/A 05/08/2023   Procedure: ESOPHAGOGASTRODUODENOSCOPY (EGD);  Surgeon: Lynann Bologna, MD;  Location: [facility];  Service: Gastroenterology;  Laterality: N/A;   TONSILLECTOMY     UPPER GASTROINTESTINAL ENDOSCOPY     Patient Active Problem List   Diagnosis Date Noted   Pneumonia due to infectious organism 09/24/2023   Proteinuria 09/09/2023   IDA (iron deficiency anemia)  09/09/2023   Hematuria 07/09/2023   Dementia likely due to Alzheimer's disease 07/06/2023   Dysuria 05/27/2023   Hyperlipidemia 11/11/2020   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   Abnormal liver enzymes 09/19/2019   Blurred vision 09/19/2019   Hypercalcemia 03/15/2019   Major depressive disorder 11/24/2018   Primary hyperparathyroidism 06/12/2013   GERD (gastroesophageal reflux disease) 02/19/2012   Polycythemia 12/03/2010   Type II diabetes mellitus 10/13/2007   Essential hypertension 10/13/2007   Osteoporosis 10/13/2007   Menopausal syndrome 10/13/2007    PCP: Elenore Paddy, NP  REFERRING PROVIDER:  [name], PA-C  REFERRING DIAG: G20.C (ICD-10-CM) - Parkinsonism, unspecified Parkinsonism type (HCC) R42 (ICD-10-CM) - Vertigo  THERAPY DIAG:  Dizziness and giddiness  Other abnormalities of gait and mobility  Muscle weakness (generalized)  ONSET DATE: 08/10/2023 (date of referral)  Rationale for Evaluation and Treatment: Rehabilitation  SUBJECTIVE:   SUBJECTIVE STATEMENT: Patient reports that she is doing well. Patient denies falls and near falls. States dizziness continues to work on exercise where she lays on side and sits up which seems to be helping.  Pt accompanied by:  [name]  PERTINENT HISTORY: PMH: HTN, GERD, DM, osteoporosis, parkinsonism   Pt underwent repeat Neuropsychological testing with Dr. 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 No  Vitals:   09/24/23 0856  BP: 118/80  Pulse: 79    PRECAUTIONS: Fall and Other: Osteoporosis  WEIGHT BEARING RESTRICTIONS: No  FALLS: Has patient fallen in last 6 months? No  LIVING ENVIRONMENT: Lives with:  lives  with her husband and her granddaughter, pt's spouse is primary caregiver  Lives in: House/apartment - 2 stories  Stairs: Yes: External: 2 steps; on right going up Has following equipment at  home: shower chair and Grab bars  PLOF: Independent with household mobility without device, Needs assistance with ADLs, Needs assistance with transfers, and pt's husband helps with bathing, dressing. Has needed help for at least 5 years  PATIENT GOALS: Wants to not be dizzy and wants to keep moving   OBJECTIVE:  Note: Objective measures were completed at Evaluation unless otherwise noted.  DIAGNOSTIC FINDINGS:  05/31/23 Brain MRI "IMPRESSION: 1. No evidence of an acute intracranial abnormality. 2. Mild chronic small vessel ischemic changes within the cerebral white matter, similar to the prior brain MRI of 07/22/2020. 3. There are a few chronic microhemorrhages within the supratentorial brain, as described and increased in number from the prior MRI. 4. Mild generalized cerebral atrophy."  COGNITION: Overall cognitive status: Hx of cognitive impairments/dementia per chart review     MUSCLE TONE:  Rigidity    POSTURE:  rounded shoulders, forward head, and increased thoracic kyphosis  RUE>LUE held into elbow flexion   Cervical ROM:    Limited cervical AROM in rotation and in extension    STRENGTH: Not formally assessed, but decr functional strength with sit <> stands    BED MOBILITY:  Pt reports her husband helps her get in and out of the bed.   During session with positional testing, pt needing to provide mod A with sidelying <> sit    VESTIBULAR ASSESSMENT:  GENERAL OBSERVATION: Ambulates in with no AD- shuffled steps, bradykinetic, incr forward flexed posture   SYMPTOM BEHAVIOR:  Subjective history: See above, pt with vertigo >15 years   Non-Vestibular symptoms:  N/A  Type of dizziness: Spinning/Vertigo  Frequency: Daily when getting out of bed   Duration: Few minutes   Aggravating factors: Induced by position change: supine to sit  Relieving factors:  waiting for it to calm down   Progression of symptoms: unchanged  OCULOMOTOR EXAM:  Ocular Alignment:  normal  Ocular ROM: No Limitations  Spontaneous Nystagmus: absent  Gaze-Induced Nystagmus: absent  Smooth Pursuits: intact and a little jumpy at times, cues to keep head still  Saccades: hypometric/undershoots  VESTIBULAR - OCULAR REFLEX:   Slow VOR: Normal  VOR Cancellation: Normal  Head-Impulse Test: HIT Right: unable to accurately assess due to guarding HIT Left: unable to accurately assess due to guarding Pt reports feeling dizzy and uncomfortable     POSITIONAL TESTING: Right Sidelying: performed 2 reps, performed on 2 pillows, first assessment pt with incr dizziness and needing to close her eyes, 2nd attempt no nystagmus noted with pt reporting less dizziness, still needing to initially close her eyes   Left Sidelying: performed 2 reps, first rep performed with single pillow and due to limited cervical ROM, pt unable to rest head on pillow, pt reporting feeling dizzy and closing her eyes, therapist unable to open pt's eyes to see if there was any nystagmus. With 2nd re-assessment performed with 2nd pillow with pt able to rest head on pillow, pt reporting less dizziness and still closing her eyes, no nystagmus noted   Due to cognition, pt difficulty with explaining dizziness/vertigo  VESTIBULAR TREATMENT:  Vitals:   09/24/23 0856  BP: 118/80  Pulse: 79   Seated on L arm  GAIT: Gait pattern: decreased arm swing- Right, decreased arm swing- Left, decreased step length- Right, decreased step length- Left, Right foot flat, Left foot flat, shuffling, decreased trunk rotation, trunk flexed, and narrow BOS Distance walked: 1 x 230' with RW, pt ambulates in and out of clinic with no AD  Assistive device utilized: 2 wheeled RW, no AD Level of assistance: Supervision with RW, CGA with no AD  Comments: Pt ambulates into session with no AD with forward flexed posture, narrow BOS, shuffled  steps and very decr stride length, pt also holds BUE into flexion  Trialed gait with RW for 1 x 230' with supervision, patient able to perform with use safely, intermittent cues to increase closeness to walker to minimize forward flexed posture, cues for safety with sit to stand and transfers; education on how to set up RW height   NMR:  Sit to stands with seated large amplitude reaching to maximize postural control and trunk stability for balance with external cues to therapist arms 3 x 5 reps Standing with step out taps to 2 blaze pods on floor and 4 on mirror for maximal UE reaching, 1 round x 3 minutes and 1 x 1.5 minutes, cues for amplitude and speed of movement  Required seated break after second round due to end of session fatigue  PATIENT EDUCATION: Education details: continue HEP + bring RW to next session, recommend use in future sessions  Person educated: Patient and pt's husband Education method: Explanation, Demonstration, Verbal cues, and Handouts Education comprehension: verbalized understanding, returned demonstration, and verbal cues required  HOME EXERCISE PROGRAM: Access Code: [email or web address] URL: [email or web address] Date: 09/14/2023 Prepared by: Sherlie Ban  Exercises - Brandt-Daroff Vestibular Exercise  - 2 x daily - 7 x weekly - 1 sets - 3-4 reps  Seated PWR Up, Rock, Step  See ortho PT notes for HEP given   GOALS: Goals reviewed with patient? Yes  SHORT TERM GOALS: ALL STGS = LTGS   LONG TERM GOALS: Target date: 10/12/2023   Pt will be independent with final HEP with family supervision for vertigo/Parkinsonism in order to build upon functional gains made in therapy.  Baseline:  Goal status: INITIAL  2.  Pt will improve TUG time to 18 seconds or less in order to demo decrease fall risk.  Baseline: 21.2 seconds with no AD Goal status: INITIAL  3.  Pt will report 3/10 or less dizziness with bed mobility.  Baseline:  Goal status:  INITIAL  4. Pt will improve 5x sit<>stand to less than or equal to 16.5 sec to demonstrate improved functional strength and transfer efficiency.  Baseline: 18.9 seconds with no UE support Goal status: INITIAL  5.  Pt will improve gait speed with LRAD to at least 1.6 ft/sec in order to demo improved household mobility/decr fall risk.  Baseline: 24.78 seconds with no AD = 1.32 ft/sec Goal status: INITIAL    ASSESSMENT:  CLINICAL IMPRESSION: Pt seen for PT session with emphasis on AD safety and large amplitude movements to maximize postural control, balance, and stepping reaction. Patient tolerated use of blaze pods well with longer duration to allow for cognitive and physical delay of movements. Improved with task with repetition. Did fatigue by end of session. Recommend RW at this time and family to bring into next session.Will continue per POC. .  OBJECTIVE IMPAIRMENTS: Abnormal gait, decreased activity tolerance, decreased balance, decreased cognition, decreased  coordination, decreased endurance, decreased knowledge of use of DME, decreased mobility, difficulty walking, decreased strength, dizziness, hypomobility, impaired flexibility, and postural dysfunction.   ACTIVITY LIMITATIONS: carrying, lifting, bending, stairs, transfers, bed mobility, bathing, toileting, dressing, hygiene/grooming, locomotion level, and caring for others  PARTICIPATION LIMITATIONS: cleaning, laundry, driving, shopping, and community activity  PERSONAL FACTORS: Age, Behavior pattern, Past/current experiences, Time since onset of injury/illness/exacerbation, and 3+ comorbidities: HTN, GERD, DM, osteoporosis, parkinsonism, major cognitive disorder  are also affecting patient's functional outcome.   REHAB POTENTIAL: Fair due to chronicity of condition/vertigo, pt with cognitive impairments  CLINICAL DECISION MAKING: Evolving/moderate complexity  EVALUATION COMPLEXITY: Moderate   PLAN:  PT FREQUENCY:  2x/week  PT DURATION: 8 weeks - anticipate just 4 weeks  PLANNED INTERVENTIONS: 97164- PT Re-evaluation, 97110-Therapeutic exercises, 97530- Therapeutic activity, [id number]- Neuromuscular re-education, 97535- Self Care, [id number]- Manual therapy, [id number]- Gait training, [phone]- Canalith repositioning, Patient/Family education, Balance training, Vestibular training, and DME instructions  PLAN FOR NEXT SESSION:Try gentle VOR if pt can tolerate. Work on [employer] specific exercises - sit <> stands, try to work on gait with RW. Work on larger amplitude movements/standing balance   Set up RW if paitent brings in, blaze pod tasks with longer duration intervals for large amplitude movements, safety with RW and transfers   Carmelia Bake, PT, DPT 09/27/2023, 5:07 PM

## 2023-09-24 NOTE — Assessment & Plan Note (Signed)
Acute, clinically improving, VSS Some fiant crackles noted to right lung field today otherwise lung fields clear.  Patient encouraged to continue taking doxycycline until course has been completed. Follow-up as scheduled, or sooner as needed.

## 2023-09-29 ENCOUNTER — Encounter: Payer: Self-pay | Admitting: Physical Therapy

## 2023-09-29 ENCOUNTER — Ambulatory Visit: Payer: Medicare Other | Admitting: Physical Therapy

## 2023-09-29 DIAGNOSIS — R293 Abnormal posture: Secondary | ICD-10-CM

## 2023-09-29 DIAGNOSIS — M6281 Muscle weakness (generalized): Secondary | ICD-10-CM

## 2023-09-29 DIAGNOSIS — R2689 Other abnormalities of gait and mobility: Secondary | ICD-10-CM

## 2023-09-29 DIAGNOSIS — R42 Dizziness and giddiness: Secondary | ICD-10-CM

## 2023-09-29 DIAGNOSIS — R29818 Other symptoms and signs involving the nervous system: Secondary | ICD-10-CM

## 2023-09-29 NOTE — Therapy (Signed)
OUTPATIENT PHYSICAL THERAPY VESTIBULAR TREATMENT     Patient Name: Brandi Walsh MRN: [id number] DOB:Aug 29, 1946, 77 y.o., female Today's Date: 09/29/2023  END OF SESSION:  PT End of Session - 09/29/23 0848     Visit Number 4    Number of Visits 9    Date for PT Re-Evaluation 11/13/23    Authorization Type [employer] medicare    PT Start Time [phone]    PT Stop Time 0915   pt requesting to end session early   PT Time Calculation (min) 29 min    Equipment Utilized During Treatment --    Activity Tolerance Patient tolerated treatment well   limited by dizziness   Behavior During Therapy [facility] for tasks assessed/performed               Past Medical History:  Diagnosis Date   Abnormal liver enzymes 09/19/2019   Blurred vision 09/19/2019   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   Conjunctivitis of both eyes 05/27/2023   Dementia likely due to Alzheimer's disease 07/06/2023   Dysuria 05/27/2023   Essential hypertension 10/13/2007   GERD (gastroesophageal reflux disease) 02/19/2012   Hypercalcemia 03/15/2019   Hyperlipidemia 11/11/2020   Localized edema 01/22/2017   Major depressive disorder 11/24/2018   Melena 05/06/2023   Menopausal syndrome 10/13/2007   Osteoporosis 10/13/2007   Polycythemia 12/03/2010   Primary hyperparathyroidism 06/12/2013   Type II diabetes mellitus 10/13/2007   Past Surgical History:  Procedure Laterality Date   ABDOMINAL HYSTERECTOMY     BIOPSY  05/08/2023   Procedure: BIOPSY;  Surgeon: Lynann Bologna, MD;  Location: [facility];  Service: Gastroenterology;;   BREAST BIOPSY  08/2002   ESOPHAGOGASTRODUODENOSCOPY N/A 05/08/2023   Procedure: ESOPHAGOGASTRODUODENOSCOPY (EGD);  Surgeon: Lynann Bologna, MD;  Location: [facility];  Service: Gastroenterology;  Laterality: N/A;   TONSILLECTOMY     UPPER GASTROINTESTINAL ENDOSCOPY     Patient Active Problem List   Diagnosis Date Noted   Pneumonia due to infectious organism 09/24/2023   Proteinuria 09/09/2023   IDA  (iron deficiency anemia) 09/09/2023   Hematuria 07/09/2023   Dementia likely due to Alzheimer's disease 07/06/2023   Dysuria 05/27/2023   Hyperlipidemia 11/11/2020   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   Abnormal liver enzymes 09/19/2019   Blurred vision 09/19/2019   Hypercalcemia 03/15/2019   Major depressive disorder 11/24/2018   Primary hyperparathyroidism 06/12/2013   GERD (gastroesophageal reflux disease) 02/19/2012   Polycythemia 12/03/2010   Type II diabetes mellitus 10/13/2007   Essential hypertension 10/13/2007   Osteoporosis 10/13/2007   Menopausal syndrome 10/13/2007    PCP: Elenore Paddy, NP  REFERRING PROVIDER:  [name], PA-C  REFERRING DIAG: G20.C (ICD-10-CM) - Parkinsonism, unspecified Parkinsonism type (HCC) R42 (ICD-10-CM) - Vertigo  THERAPY DIAG:  Dizziness and giddiness  Other abnormalities of gait and mobility  Muscle weakness (generalized)  Abnormal posture  Other symptoms and signs involving the nervous system  ONSET DATE: 08/10/2023 (date of referral)  Rationale for Evaluation and Treatment: Rehabilitation  SUBJECTIVE:   SUBJECTIVE STATEMENT: No falls. Has been using the walker at home and reports feeling more balanced with it. Did not bring it in to session today. Notes dizziness is getting better. Still having some dizziness when turning over in bed. Notes that is the only time she is getting dizzy.   Pt accompanied by:  [name] (in waiting room)  PERTINENT HISTORY: PMH: HTN, GERD, DM, osteoporosis, parkinsonism   Pt underwent repeat Neuropsychological testing with Dr. 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 No  There were no vitals filed for this visit.   PRECAUTIONS: Fall and Other: Osteoporosis  WEIGHT BEARING RESTRICTIONS: No  FALLS: Has patient fallen in last 6 months? No  LIVING ENVIRONMENT: Lives with:  lives with  her husband and her granddaughter, pt's spouse is primary caregiver  Lives in: House/apartment - 2 stories  Stairs: Yes: External: 2 steps; on right going up Has following equipment at home: shower chair and Grab bars  PLOF: Independent with household mobility without device, Needs assistance with ADLs, Needs assistance with transfers, and pt's husband helps with bathing, dressing. Has needed help for at least 5 years  PATIENT GOALS: Wants to not be dizzy and wants to keep moving   OBJECTIVE:  Note: Objective measures were completed at Evaluation unless otherwise noted.  DIAGNOSTIC FINDINGS:  05/31/23 Brain MRI "IMPRESSION: 1. No evidence of an acute intracranial abnormality. 2. Mild chronic small vessel ischemic changes within the cerebral white matter, similar to the prior brain MRI of 07/22/2020. 3. There are a few chronic microhemorrhages within the supratentorial brain, as described and increased in number from the prior MRI. 4. Mild generalized cerebral atrophy."  COGNITION: Overall cognitive status: Hx of cognitive impairments/dementia per chart review     MUSCLE TONE:  Rigidity    POSTURE:  rounded shoulders, forward head, and increased thoracic kyphosis  RUE>LUE held into elbow flexion   Cervical ROM:    Limited cervical AROM in rotation and in extension    STRENGTH: Not formally assessed, but decr functional strength with sit <> stands    BED MOBILITY:  Pt reports her husband helps her get in and out of the bed.   During session with positional testing, pt needing to provide mod A with sidelying <> sit    VESTIBULAR ASSESSMENT:  GENERAL OBSERVATION: Ambulates in with no AD- shuffled steps, bradykinetic, incr forward flexed posture   SYMPTOM BEHAVIOR:  Subjective history: See above, pt with vertigo >15 years   Non-Vestibular symptoms:  N/A  Type of dizziness: Spinning/Vertigo  Frequency: Daily when getting out of bed   Duration: Few minutes    Aggravating factors: Induced by position change: supine to sit  Relieving factors:  waiting for it to calm down   Progression of symptoms: unchanged  OCULOMOTOR EXAM:  Ocular Alignment: normal  Ocular ROM: No Limitations  Spontaneous Nystagmus: absent  Gaze-Induced Nystagmus: absent  Smooth Pursuits: intact and a little jumpy at times, cues to keep head still  Saccades: hypometric/undershoots  VESTIBULAR - OCULAR REFLEX:   Slow VOR: Normal  VOR Cancellation: Normal  Head-Impulse Test: HIT Right: unable to accurately assess due to guarding HIT Left: unable to accurately assess due to guarding Pt reports feeling dizzy and uncomfortable     POSITIONAL TESTING: Right Sidelying: performed 2 reps, performed on 2 pillows, first assessment pt with incr dizziness and needing to close her eyes, 2nd attempt no nystagmus noted with pt reporting less dizziness, still needing to initially close her eyes   Left Sidelying: performed 2 reps, first rep performed with single pillow and due to limited cervical ROM, pt unable to rest head on pillow, pt reporting feeling dizzy and closing her eyes, therapist unable to open pt's eyes to see if there was any nystagmus. With 2nd re-assessment performed with 2nd pillow with pt able to rest head on pillow, pt reporting less dizziness and still closing her eyes, no  nystagmus noted   Due to cognition, pt difficulty with explaining dizziness/vertigo  VESTIBULAR TREATMENT:                                                                                                     GAIT: Gait pattern: decreased arm swing- Right, decreased arm swing- Left, decreased step length- Right, decreased step length- Left, Right foot flat, Left foot flat, shuffling, decreased trunk rotation, trunk flexed, and narrow BOS Distance walked: pt ambulated into clinic with no AD Assistive device utilized: 2 wheeled RW, no AD Level of assistance: Supervision with RW, CGA with no AD   Comments: Pt ambulates into session with no AD with forward flexed posture, narrow BOS, shuffled steps and very decr stride length, pt also holds BUE into flexion  Used RW during session and ambulating out to waiting room (approx. 115') with intermittent cues for staying inside RW for improved posture and incr stride length  Therapeutic Exercise: SciFit with BUE/BLE at gear 1.5 for 6 minutes, pt reporting incr shoulder discomfort after 2:30 (PT turned the handles in and pt reporting that it felt better). Pt able to keep spm >90. Pt reporting RPE as 6-7/10.   For sit <> stands during session, cued to scoot out towards edge and incr forward lean to stand.   NMR: Attempted repeated rolling for habituation as pt still reports some dizziness when rolling in bed.  Pt performed sit > supine with min A from therapist to help bring BLE onto mat table. Pt rolled over to L side with min A. In this position, pt reporting 10/10 dizziness, no nystagmus noted. Pt then needing to close her eyes. Pt reporting that dizziness got better and then got worse and pt requesting to sit back up. Once pt sitting up on the edge of mat table, pt requesting to be done with therapy today. PT asked pt if she would want to do other exercises that would not make her dizzy, but pt declined.   After ambulating pt out to waiting room with husband, educated on potentially trying rolling from R to L for a habituation exercise to see if that helps with dizziness when rolling as that has helped with [place] exercises, that way she can try it at home and not in therapy. Educated to pt/husband on purpose of habituation exercises for dizziness   PATIENT EDUCATION: Education details: continue HEP + bring RW to next session, recommend use in future sessions  Person educated: Patient and pt's husband Education method: Explanation, Demonstration, and Verbal cues Education comprehension: verbalized understanding, returned demonstration,  and verbal cues required  HOME EXERCISE PROGRAM: Access Code: [email or web address] URL: [email or web address] Date: 09/14/2023 Prepared by: Sherlie Ban  Exercises - Brandt-Daroff Vestibular Exercise  - 2 x daily - 7 x weekly - 1 sets - 3-4 reps Verbally added trying repeated rolling at home to see if it helps with dizziness   Seated PWR Up, Rock, Step  See ortho PT notes for HEP given   GOALS: Goals reviewed with patient? Yes  SHORT TERM GOALS: ALL STGS = LTGS  LONG TERM GOALS: Target date: 10/12/2023   Pt will be independent with final HEP with family supervision for vertigo/Parkinsonism in order to build upon functional gains made in therapy.  Baseline:  Goal status: INITIAL  2.  Pt will improve TUG time to 18 seconds or less in order to demo decrease fall risk.  Baseline: 21.2 seconds with no AD Goal status: INITIAL  3.  Pt will report 3/10 or less dizziness with bed mobility.  Baseline:  Goal status: INITIAL  4. Pt will improve 5x sit<>stand to less than or equal to 16.5 sec to demonstrate improved functional strength and transfer efficiency.  Baseline: 18.9 seconds with no UE support Goal status: INITIAL  5.  Pt will improve gait speed with LRAD to at least 1.6 ft/sec in order to demo improved household mobility/decr fall risk.  Baseline: 24.78 seconds with no AD = 1.32 ft/sec Goal status: INITIAL    ASSESSMENT:  CLINICAL IMPRESSION: Initiated session on Scifit for ROM/larger amplitude movements with pt able to tolerate for 6 minutes before requesting to stop due to fatigue. Attempted to work on repeated rolling today for habituation for pt reporting dizziness with rolling in bed (dizziness has improved with sidelying <> sit). With rolling to L, pt reporting 10/10 dizziness and then requesting to be sat back up. No nystagmus noted from PT. Pt then requesting to end therapy session today. Pt reporting dizziness felt better in sitting. PT tried to  re-direct and try to work on other exercises today, but pt declined. Educated pt and pt's spouse to bring in [place]. Will continue per POC. .  OBJECTIVE IMPAIRMENTS: Abnormal gait, decreased activity tolerance, decreased balance, decreased cognition, decreased coordination, decreased endurance, decreased knowledge of use of DME, decreased mobility, difficulty walking, decreased strength, dizziness, hypomobility, impaired flexibility, and postural dysfunction.   ACTIVITY LIMITATIONS: carrying, lifting, bending, stairs, transfers, bed mobility, bathing, toileting, dressing, hygiene/grooming, locomotion level, and caring for others  PARTICIPATION LIMITATIONS: cleaning, laundry, driving, shopping, and community activity  PERSONAL FACTORS: Age, Behavior pattern, Past/current experiences, Time since onset of injury/illness/exacerbation, and 3+ comorbidities: HTN, GERD, DM, osteoporosis, parkinsonism, major cognitive disorder  are also affecting patient's functional outcome.   REHAB POTENTIAL: Fair due to chronicity of condition/vertigo, pt with cognitive impairments  CLINICAL DECISION MAKING: Evolving/moderate complexity  EVALUATION COMPLEXITY: Moderate   PLAN:  PT FREQUENCY: 2x/week  PT DURATION: 8 weeks - anticipate just 4 weeks  PLANNED INTERVENTIONS: 97164- PT Re-evaluation, 97110-Therapeutic exercises, 97530- Therapeutic activity, 97112- Neuromuscular re-education, 97535- Self Care, [id number]- Manual therapy, [phone]- Gait training, [phone]- [place] repositioning, Patient/Family education, Balance training, Vestibular training, and DME instructions  PLAN FOR NEXT SESSION:Work on parkinsons specific exercises - sit <> stands, try to work on gait with RW. Work on larger amplitude movements/standing balance   Set up RW if paitent brings in, blaze pod tasks with longer duration intervals for large amplitude movements, safety with RW and transfers   Drake Leach, PT, DPT 09/29/2023, 9:30 AM

## 2023-10-01 ENCOUNTER — Encounter: Payer: Self-pay | Admitting: Physical Therapy

## 2023-10-01 ENCOUNTER — Ambulatory Visit: Payer: Medicare Other | Attending: Nurse Practitioner | Admitting: Physical Therapy

## 2023-10-01 VITALS — BP 112/68 | HR 86

## 2023-10-01 DIAGNOSIS — R42 Dizziness and giddiness: Secondary | ICD-10-CM

## 2023-10-01 DIAGNOSIS — R2689 Other abnormalities of gait and mobility: Secondary | ICD-10-CM | POA: Diagnosis present

## 2023-10-01 DIAGNOSIS — R29898 Other symptoms and signs involving the musculoskeletal system: Secondary | ICD-10-CM | POA: Diagnosis present

## 2023-10-01 DIAGNOSIS — R293 Abnormal posture: Secondary | ICD-10-CM

## 2023-10-01 DIAGNOSIS — M6281 Muscle weakness (generalized): Secondary | ICD-10-CM | POA: Diagnosis present

## 2023-10-01 DIAGNOSIS — R29818 Other symptoms and signs involving the nervous system: Secondary | ICD-10-CM | POA: Insufficient documentation

## 2023-10-01 NOTE — Therapy (Signed)
OUTPATIENT PHYSICAL THERAPY VESTIBULAR TREATMENT     Patient Name: Brandi Walsh MRN: [id number] DOB:12-Nov-1946, 77 y.o., female Today's Date: 10/01/2023  END OF SESSION:  PT End of Session - 10/01/23 0853     Visit Number 5    Number of Visits 9    Date for PT Re-Evaluation 11/13/23    Authorization Type [employer] medicare    Progress Note Due on Visit 10    PT Start Time 0850    PT Stop Time 0930    PT Time Calculation (min) 40 min    Equipment Utilized During Treatment Gait belt    Activity Tolerance Patient tolerated treatment well    Behavior During Therapy WFL for tasks assessed/performed             Past Medical History:  Diagnosis Date   Abnormal liver enzymes 09/19/2019   Blurred vision 09/19/2019   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   Conjunctivitis of both eyes 05/27/2023   Dementia likely due to Alzheimer's disease 07/06/2023   Dysuria 05/27/2023   Essential hypertension 10/13/2007   GERD (gastroesophageal reflux disease) 02/19/2012   Hypercalcemia 03/15/2019   Hyperlipidemia 11/11/2020   Localized edema 01/22/2017   Major depressive disorder 11/24/2018   Melena 05/06/2023   Menopausal syndrome 10/13/2007   Osteoporosis 10/13/2007   Polycythemia 12/03/2010   Primary hyperparathyroidism 06/12/2013   Type II diabetes mellitus 10/13/2007   Past Surgical History:  Procedure Laterality Date   ABDOMINAL HYSTERECTOMY     BIOPSY  05/08/2023   Procedure: BIOPSY;  Surgeon: Lynann Bologna, MD;  Location: [facility];  Service: Gastroenterology;;   BREAST BIOPSY  08/2002   ESOPHAGOGASTRODUODENOSCOPY N/A 05/08/2023   Procedure: ESOPHAGOGASTRODUODENOSCOPY (EGD);  Surgeon: Lynann Bologna, MD;  Location: [facility];  Service: Gastroenterology;  Laterality: N/A;   TONSILLECTOMY     UPPER GASTROINTESTINAL ENDOSCOPY     Patient Active Problem List   Diagnosis Date Noted   Pneumonia due to infectious organism 09/24/2023   Proteinuria 09/09/2023   IDA (iron deficiency anemia)  09/09/2023   Hematuria 07/09/2023   Dementia likely due to Alzheimer's disease 07/06/2023   Dysuria 05/27/2023   Hyperlipidemia 11/11/2020   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   Abnormal liver enzymes 09/19/2019   Blurred vision 09/19/2019   Hypercalcemia 03/15/2019   Major depressive disorder 11/24/2018   Primary hyperparathyroidism 06/12/2013   GERD (gastroesophageal reflux disease) 02/19/2012   Polycythemia 12/03/2010   Type II diabetes mellitus 10/13/2007   Essential hypertension 10/13/2007   Osteoporosis 10/13/2007   Menopausal syndrome 10/13/2007    PCP: Elenore Paddy, NP  REFERRING PROVIDER:  [name], PA-C  REFERRING DIAG: G20.C (ICD-10-CM) - Parkinsonism, unspecified Parkinsonism type (HCC) R42 (ICD-10-CM) - Vertigo  THERAPY DIAG:  Dizziness and giddiness  Other abnormalities of gait and mobility  Muscle weakness (generalized)  Abnormal posture  ONSET DATE: 08/10/2023 (date of referral)  Rationale for Evaluation and Treatment: Rehabilitation  SUBJECTIVE:   SUBJECTIVE STATEMENT: Patient arrives to session with 2WW folded up; initially hand hold assist from spouse and then therapist unfolds walker for patient to walk in. Patient denies falls and near falls since last here. States she has not been as dizzy as last time she was here. Denies falls and near falls.   Pt accompanied by:  [name] (in waiting room)  PERTINENT HISTORY: PMH: HTN, GERD, DM, osteoporosis, parkinsonism   Pt underwent repeat Neuropsychological testing with Dr. 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 No  Vitals:   10/01/23 0857  BP: 112/68  Pulse: 86   PRECAUTIONS: Fall and Other: Osteoporosis  WEIGHT BEARING RESTRICTIONS: No  FALLS: Has patient fallen in last 6 months? No  LIVING ENVIRONMENT: Lives with:  lives with her husband and her granddaughter, pt's spouse is  primary caregiver  Lives in: House/apartment - 2 stories  Stairs: Yes: External: 2 steps; on right going up Has following equipment at home: shower chair and Grab bars  PLOF: Independent with household mobility without device, Needs assistance with ADLs, Needs assistance with transfers, and pt's husband helps with bathing, dressing. Has needed help for at least 5 years  PATIENT GOALS: Wants to not be dizzy and wants to keep moving   OBJECTIVE:  Note: Objective measures were completed at Evaluation unless otherwise noted.  DIAGNOSTIC FINDINGS:  05/31/23 Brain MRI "IMPRESSION: 1. No evidence of an acute intracranial abnormality. 2. Mild chronic small vessel ischemic changes within the cerebral white matter, similar to the prior brain MRI of 07/22/2020. 3. There are a few chronic microhemorrhages within the supratentorial brain, as described and increased in number from the prior MRI. 4. Mild generalized cerebral atrophy."  COGNITION: Overall cognitive status: Hx of cognitive impairments/dementia per chart review     MUSCLE TONE:  Rigidity    POSTURE:  rounded shoulders, forward head, and increased thoracic kyphosis  RUE>LUE held into elbow flexion   Cervical ROM:    Limited cervical AROM in rotation and in extension    STRENGTH: Not formally assessed, but decr functional strength with sit <> stands    BED MOBILITY:  Pt reports her husband helps her get in and out of the bed.   During session with positional testing, pt needing to provide mod A with sidelying <> sit   VESTIBULAR ASSESSMENT:  GENERAL OBSERVATION: Ambulates in with no AD- shuffled steps, bradykinetic, incr forward flexed posture   SYMPTOM BEHAVIOR:  Subjective history: See above, pt with vertigo >15 years   Non-Vestibular symptoms:  N/A  Type of dizziness: Spinning/Vertigo  Frequency: Daily when getting out of bed   Duration: Few minutes   Aggravating factors: Induced by position change: supine to  sit  Relieving factors:  waiting for it to calm down   Progression of symptoms: unchanged  OCULOMOTOR EXAM:  Ocular Alignment: normal  Ocular ROM: No Limitations  Spontaneous Nystagmus: absent  Gaze-Induced Nystagmus: absent  Smooth Pursuits: intact and a little jumpy at times, cues to keep head still  Saccades: hypometric/undershoots  VESTIBULAR - OCULAR REFLEX:   Slow VOR: Normal  VOR Cancellation: Normal  Head-Impulse Test: HIT Right: unable to accurately assess due to guarding HIT Left: unable to accurately assess due to guarding Pt reports feeling dizzy and uncomfortable     POSITIONAL TESTING: Right Sidelying: performed 2 reps, performed on 2 pillows, first assessment pt with incr dizziness and needing to close her eyes, 2nd attempt no nystagmus noted with pt reporting less dizziness, still needing to initially close her eyes   Left Sidelying: performed 2 reps, first rep performed with single pillow and due to limited cervical ROM, pt unable to rest head on pillow, pt reporting feeling dizzy and closing her eyes, therapist unable to open pt's eyes to see if there was any nystagmus. With 2nd re-assessment performed with 2nd pillow with pt able to rest head on pillow, pt reporting less dizziness and still closing her eyes, no nystagmus noted   Due to cognition, pt  difficulty with explaining dizziness/vertigo  VESTIBULAR TREATMENT:                                                                                                    Vitals:   10/01/23 0857  BP: 112/68  Pulse: 86  Seated on R arm  GAIT: Gait pattern: decreased arm swing- Right, decreased arm swing- Left, decreased step length- Right, decreased step length- Left, Right foot flat, Left foot flat, shuffling, decreased trunk rotation, trunk flexed, and narrow BOS Distance walked: pt ambulated into clinic with no AD Assistive device utilized: 2 wheeled RW Level of assistance: Supervision with RW Comments: RW limited  due to dragging of wheels that is patient's personal walker, unable to provide tennis balls in today's session as therapist out but advised patient and spouse to bring RW next session and would hopefully have more to provide  Used RW during session and ambulating out to waiting room (approx. 115') with intermittent cues for keeping within BOS of RW for improved posture and incr stride length  NMR:  SciFit level 2 x 5 minutes with bilateral UE / LE and external cues for amplitude of movement and reciprocal pattern, required final 2 min without RUE due to shoulder pain 6 Blaze pods on random setting with 4 on mirror and 2 on floor  x 2.5 min tapping to mirrors angles at 45 degrees to patient with lateral stepping to various points (~20 taps) 6 Blaze pods on random setting with 4 on mirror and 2 on floor  x 1.5 min tapping to mirrors angles at 45 degrees to patient with NBOS to large lateral stepping to various points and cross body reaching (~15 taps) Required seated break due to fatigue Seated on green dina disk for postural control and stability with cross body and same side reaching and LE tapping to 6  blaze pods on 2 mirrors and to floor (patient tolerated well)  PATIENT EDUCATION: Education details: continue HEP + bring RW for home to add tennis balls Person educated: Patient and pt's husband Education method: Explanation, Demonstration, and Verbal cues Education comprehension: verbalized understanding, returned demonstration, and verbal cues required  HOME EXERCISE PROGRAM: Access Code: [email or web address] URL: [email or web address] Date: 09/14/2023 Prepared by: Sherlie Ban  Exercises - Brandt-Daroff Vestibular Exercise  - 2 x daily - 7 x weekly - 1 sets - 3-4 reps Verbally added trying repeated rolling at home to see if it helps with dizziness   Seated PWR Up, Rock, Step  See ortho PT notes for HEP given   GOALS: Goals reviewed with patient? Yes  SHORT TERM GOALS: ALL  STGS = LTGS   LONG TERM GOALS: Target date: 10/12/2023   Pt will be independent with final HEP with family supervision for vertigo/Parkinsonism in order to build upon functional gains made in therapy.  Baseline:  Goal status: INITIAL  2.  Pt will improve TUG time to 18 seconds or less in order to demo decrease fall risk.  Baseline: 21.2 seconds with no AD Goal status: INITIAL  3.  Pt will report 3/10 or less dizziness with  bed mobility.  Baseline:  Goal status: INITIAL  4. Pt will improve 5x sit<>stand to less than or equal to 16.5 sec to demonstrate improved functional strength and transfer efficiency.  Baseline: 18.9 seconds with no UE support Goal status: INITIAL  5.  Pt will improve gait speed with LRAD to at least 1.6 ft/sec in order to demo improved household mobility/decr fall risk.  Baseline: 24.78 seconds with no AD = 1.32 ft/sec Goal status: INITIAL    ASSESSMENT:  CLINICAL IMPRESSION: Initiated session on Scifit for ROM/larger amplitude movements with pt able to tolerate for 5 minutes with modification for RUE shoulder pain. Remainder of session worked on gait training and large amplitude reaching and tapping to external targets. Patient demonstrated endurance limitations with ~ 10 minutes of standing exercises before requiring transition to seated exercise. Will continue per POC. .  OBJECTIVE IMPAIRMENTS: Abnormal gait, decreased activity tolerance, decreased balance, decreased cognition, decreased coordination, decreased endurance, decreased knowledge of use of DME, decreased mobility, difficulty walking, decreased strength, dizziness, hypomobility, impaired flexibility, and postural dysfunction.   ACTIVITY LIMITATIONS: carrying, lifting, bending, stairs, transfers, bed mobility, bathing, toileting, dressing, hygiene/grooming, locomotion level, and caring for others  PARTICIPATION LIMITATIONS: cleaning, laundry, driving, shopping, and community  activity  PERSONAL FACTORS: Age, Behavior pattern, Past/current experiences, Time since onset of injury/illness/exacerbation, and 3+ comorbidities: HTN, GERD, DM, osteoporosis, parkinsonism, major cognitive disorder  are also affecting patient's functional outcome.   REHAB POTENTIAL: Fair due to chronicity of condition/vertigo, pt with cognitive impairments  CLINICAL DECISION MAKING: Evolving/moderate complexity  EVALUATION COMPLEXITY: Moderate   PLAN:  PT FREQUENCY: 2x/week  PT DURATION: 8 weeks - anticipate just 4 weeks  PLANNED INTERVENTIONS: 97164- PT Re-evaluation, 97110-Therapeutic exercises, 97530- Therapeutic activity, 97112- Neuromuscular re-education, 97535- Self Care, [id number]- Manual therapy, [phone]- Gait training, [phone]- Canalith repositioning, Patient/Family education, Balance training, Vestibular training, and DME instructions  PLAN FOR NEXT SESSION:Work on parkinsons specific exercises - sit <> stands, try to work on gait with RW. Work on larger amplitude movements/standing balance   Set up RW if paitent brings in, blaze pod tasks with longer duration intervals for large amplitude movements, safety with RW and transfers   Carmelia Bake, PT, DPT 10/01/2023, 11:30 AM

## 2023-10-04 ENCOUNTER — Ambulatory Visit: Payer: Medicare Other | Admitting: Physical Therapy

## 2023-10-04 ENCOUNTER — Encounter: Payer: Self-pay | Admitting: Physical Therapy

## 2023-10-04 DIAGNOSIS — R42 Dizziness and giddiness: Secondary | ICD-10-CM | POA: Diagnosis not present

## 2023-10-04 DIAGNOSIS — R2689 Other abnormalities of gait and mobility: Secondary | ICD-10-CM

## 2023-10-04 DIAGNOSIS — M6281 Muscle weakness (generalized): Secondary | ICD-10-CM

## 2023-10-04 DIAGNOSIS — R293 Abnormal posture: Secondary | ICD-10-CM

## 2023-10-04 NOTE — Therapy (Signed)
OUTPATIENT PHYSICAL THERAPY VESTIBULAR TREATMENT     Patient Name: NANA VASTINE MRN: [id number] DOB:21-Feb-1946, 77 y.o., female Today's Date: 10/04/2023  END OF SESSION:  PT End of Session - 10/04/23 0848     Visit Number 6    Number of Visits 9    Date for PT Re-Evaluation 11/13/23    Authorization Type [employer] medicare    Progress Note Due on Visit 10    PT Start Time 0845    PT Stop Time 0927    PT Time Calculation (min) 42 min    Equipment Utilized During Treatment Gait belt    Activity Tolerance Patient tolerated treatment well    Behavior During Therapy WFL for tasks assessed/performed             Past Medical History:  Diagnosis Date   Abnormal liver enzymes 09/19/2019   Blurred vision 09/19/2019   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   Conjunctivitis of both eyes 05/27/2023   Dementia likely due to Alzheimer's disease 07/06/2023   Dysuria 05/27/2023   Essential hypertension 10/13/2007   GERD (gastroesophageal reflux disease) 02/19/2012   Hypercalcemia 03/15/2019   Hyperlipidemia 11/11/2020   Localized edema 01/22/2017   Major depressive disorder 11/24/2018   Melena 05/06/2023   Menopausal syndrome 10/13/2007   Osteoporosis 10/13/2007   Polycythemia 12/03/2010   Primary hyperparathyroidism 06/12/2013   Type II diabetes mellitus 10/13/2007   Past Surgical History:  Procedure Laterality Date   ABDOMINAL HYSTERECTOMY     BIOPSY  05/08/2023   Procedure: BIOPSY;  Surgeon: Lynann Bologna, MD;  Location: [facility];  Service: Gastroenterology;;   BREAST BIOPSY  08/2002   ESOPHAGOGASTRODUODENOSCOPY N/A 05/08/2023   Procedure: ESOPHAGOGASTRODUODENOSCOPY (EGD);  Surgeon: Lynann Bologna, MD;  Location: [facility];  Service: Gastroenterology;  Laterality: N/A;   TONSILLECTOMY     UPPER GASTROINTESTINAL ENDOSCOPY     Patient Active Problem List   Diagnosis Date Noted   Pneumonia due to infectious organism 09/24/2023   Proteinuria 09/09/2023   IDA (iron deficiency anemia)  09/09/2023   Hematuria 07/09/2023   Dementia likely due to Alzheimer's disease 07/06/2023   Dysuria 05/27/2023   Hyperlipidemia 11/11/2020   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   Abnormal liver enzymes 09/19/2019   Blurred vision 09/19/2019   Hypercalcemia 03/15/2019   Major depressive disorder 11/24/2018   Primary hyperparathyroidism 06/12/2013   GERD (gastroesophageal reflux disease) 02/19/2012   Polycythemia 12/03/2010   Type II diabetes mellitus 10/13/2007   Essential hypertension 10/13/2007   Osteoporosis 10/13/2007   Menopausal syndrome 10/13/2007    PCP: Elenore Paddy, NP  REFERRING PROVIDER:  [name], PA-C  REFERRING DIAG: G20.C (ICD-10-CM) - Parkinsonism, unspecified Parkinsonism type (HCC) R42 (ICD-10-CM) - Vertigo  THERAPY DIAG:  Dizziness and giddiness  Other abnormalities of gait and mobility  Muscle weakness (generalized)  Abnormal posture  ONSET DATE: 08/10/2023 (date of referral)  Rationale for Evaluation and Treatment: Rehabilitation  SUBJECTIVE:   SUBJECTIVE STATEMENT: No changes, feels as if dizziness is better. Only notices it a little bit. Ambulates in with no AD, reports RW is in the car.   Pt accompanied by:  [name] (in waiting room)  PERTINENT HISTORY: PMH: HTN, GERD, DM, osteoporosis, parkinsonism   Pt underwent repeat Neuropsychological testing with Dr. 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 No  There were no vitals filed for this visit.  PRECAUTIONS: Fall and Other: Osteoporosis  WEIGHT BEARING  RESTRICTIONS: No  FALLS: Has patient fallen in last 6 months? No  LIVING ENVIRONMENT: Lives with:  lives with her husband and her granddaughter, pt's spouse is primary caregiver  Lives in: House/apartment - 2 stories  Stairs: Yes: External: 2 steps; on right going up Has following equipment at home: shower chair and Grab  bars  PLOF: Independent with household mobility without device, Needs assistance with ADLs, Needs assistance with transfers, and pt's husband helps with bathing, dressing. Has needed help for at least 5 years  PATIENT GOALS: Wants to not be dizzy and wants to keep moving   OBJECTIVE:  Note: Objective measures were completed at Evaluation unless otherwise noted.  DIAGNOSTIC FINDINGS:  05/31/23 Brain MRI "IMPRESSION: 1. No evidence of an acute intracranial abnormality. 2. Mild chronic small vessel ischemic changes within the cerebral white matter, similar to the prior brain MRI of 07/22/2020. 3. There are a few chronic microhemorrhages within the supratentorial brain, as described and increased in number from the prior MRI. 4. Mild generalized cerebral atrophy."  COGNITION: Overall cognitive status: Hx of cognitive impairments/dementia per chart review     MUSCLE TONE:  Rigidity    POSTURE:  rounded shoulders, forward head, and increased thoracic kyphosis  RUE>LUE held into elbow flexion   Cervical ROM:    Limited cervical AROM in rotation and in extension    STRENGTH: Not formally assessed, but decr functional strength with sit <> stands    BED MOBILITY:  Pt reports her husband helps her get in and out of the bed.   During session with positional testing, pt needing to provide mod A with sidelying <> sit   VESTIBULAR ASSESSMENT:  GENERAL OBSERVATION: Ambulates in with no AD- shuffled steps, bradykinetic, incr forward flexed posture   SYMPTOM BEHAVIOR:  Subjective history: See above, pt with vertigo >15 years   Non-Vestibular symptoms:  N/A  Type of dizziness: Spinning/Vertigo  Frequency: Daily when getting out of bed   Duration: Few minutes   Aggravating factors: Induced by position change: supine to sit  Relieving factors:  waiting for it to calm down   Progression of symptoms: unchanged  OCULOMOTOR EXAM:  Ocular Alignment: normal  Ocular ROM: No  Limitations  Spontaneous Nystagmus: absent  Gaze-Induced Nystagmus: absent  Smooth Pursuits: intact and a little jumpy at times, cues to keep head still  Saccades: hypometric/undershoots  VESTIBULAR - OCULAR REFLEX:   Slow VOR: Normal  VOR Cancellation: Normal  Head-Impulse Test: HIT Right: unable to accurately assess due to guarding HIT Left: unable to accurately assess due to guarding Pt reports feeling dizzy and uncomfortable     POSITIONAL TESTING: Right Sidelying: performed 2 reps, performed on 2 pillows, first assessment pt with incr dizziness and needing to close her eyes, 2nd attempt no nystagmus noted with pt reporting less dizziness, still needing to initially close her eyes   Left Sidelying: performed 2 reps, first rep performed with single pillow and due to limited cervical ROM, pt unable to rest head on pillow, pt reporting feeling dizzy and closing her eyes, therapist unable to open pt's eyes to see if there was any nystagmus. With 2nd re-assessment performed with 2nd pillow with pt able to rest head on pillow, pt reporting less dizziness and still closing her eyes, no nystagmus noted   Due to cognition, pt difficulty with explaining dizziness/vertigo  VESTIBULAR TREATMENT:  GAIT: Gait pattern: decreased arm swing- Right, decreased arm swing- Left, decreased step length- Right, decreased step length- Left, Right foot flat, Left foot flat, shuffling, decreased trunk rotation, trunk flexed, and narrow BOS Distance walked: pt ambulated into clinic with no AD Assistive device utilized: 2 wheeled RW Level of assistance: Supervision with RW, CGA with no AD  Comments: Pt ambulates into and out of session with no AD with forward flexed posture, narrow BOS, shuffled steps and very decr stride length, pt also holds BUE into flexion   Used RW during session with intermittent cues for  keeping within BOS of RW for improved posture and incr stride length. Pt reports she has been using her RW at home   Therapeutic Exercise: SciFit with BUE/BLE at gear 1.5 for 3 minutes for larger amplitude movements, reciprocal movements, and neural priming. Cues for big movements throughout and trying to perform with full ROM. Pt reporting RPE as 10/10.  At bottom of stair case with BUE support; 2 sets of 5 reps step ups leading with each leg, initial cues for incr foot clearance   NMR:  Standing at bottom of staircase, PT calling out colors of floor dots and pt having  to tap foot to dot for SLS, weight shifting, foot clearance. Blaze pods not working Performed 1 bout of 1 minute with BUE support, 2 bouts of 1 minute without UE support  10 reps sit <> stands on air ex for immediate standing balance, pt using BUE support to stand, cues for tall posture when upright and holding for a few seconds  Standing on air ex with purple ball 20 reps ball tosses with 2nd PT, with primary PT providing min guard, initial cues to open hands when catching ball  20 reps ball slams on floor and catch with 2nd PT for anticipatory balance, CGA for balance at times, cues for big slams and lifting arms up overhead  PATIENT EDUCATION: Education details: continue HEP + bring RW for home to add tennis balls Person educated: Patient and   Education method: Explanation, Demonstration, and Verbal cues Education comprehension: verbalized understanding, returned demonstration, and verbal cues required  HOME EXERCISE PROGRAM: Access Code: [email or web address] URL: [email or web address] Date: 09/14/2023 Prepared by: Sherlie Ban  Exercises - Brandt-Daroff Vestibular Exercise  - 2 x daily - 7 x weekly - 1 sets - 3-4 reps Verbally added trying repeated rolling at home to see if it helps with dizziness   Seated PWR Up, Rock, Step  See ortho PT notes for HEP given   GOALS: Goals reviewed with patient?  Yes  SHORT TERM GOALS: ALL STGS = LTGS   LONG TERM GOALS: Target date: 10/12/2023   Pt will be independent with final HEP with family supervision for vertigo/Parkinsonism in order to build upon functional gains made in therapy.  Baseline:  Goal status: INITIAL  2.  Pt will improve TUG time to 18 seconds or less in order to demo decrease fall risk.  Baseline: 21.2 seconds with no AD Goal status: INITIAL  3.  Pt will report 3/10 or less dizziness with bed mobility.  Baseline:  Goal status: INITIAL  4. Pt will improve 5x sit<>stand to less than or equal to 16.5 sec to demonstrate improved functional strength and transfer efficiency.  Baseline: 18.9 seconds with no UE support Goal status: INITIAL  5.  Pt will improve gait speed with LRAD to at least 1.6 ft/sec in order to demo improved household mobility/decr fall risk.  Baseline: 24.78 seconds with  no AD = 1.32 ft/sec Goal status: INITIAL    ASSESSMENT:  CLINICAL IMPRESSION: Pt did not bring in her RW from home today, ambulated in with no AD. Utilized clinic RW during session. Pt reports that she has been using her RW at home. Attempted SciFit at start of session for larger amplitude movements, but pt only able to tolerate for 3 minutes today due to fatigue. Remainder of session focused on balance/strength tasks with sit <> stands, step ups, SLS, and being on compliant surfaces. Pt needing intermittent rest breaks due to fatigue. Will continue per POC.   .  OBJECTIVE IMPAIRMENTS: Abnormal gait, decreased activity tolerance, decreased balance, decreased cognition, decreased coordination, decreased endurance, decreased knowledge of use of DME, decreased mobility, difficulty walking, decreased strength, dizziness, hypomobility, impaired flexibility, and postural dysfunction.   ACTIVITY LIMITATIONS: carrying, lifting, bending, stairs, transfers, bed mobility, bathing, toileting, dressing, hygiene/grooming, locomotion level, and  caring for others  PARTICIPATION LIMITATIONS: cleaning, laundry, driving, shopping, and community activity  PERSONAL FACTORS: Age, Behavior pattern, Past/current experiences, Time since onset of injury/illness/exacerbation, and 3+ comorbidities: HTN, GERD, DM, osteoporosis, parkinsonism, major cognitive disorder  are also affecting patient's functional outcome.   REHAB POTENTIAL: Fair due to chronicity of condition/vertigo, pt with cognitive impairments  CLINICAL DECISION MAKING: Evolving/moderate complexity  EVALUATION COMPLEXITY: Moderate   PLAN:  PT FREQUENCY: 2x/week  PT DURATION: 8 weeks - anticipate just 4 weeks  PLANNED INTERVENTIONS: 97164- PT Re-evaluation, 97110-Therapeutic exercises, 97530- Therapeutic activity, 97112- Neuromuscular re-education, 97535- Self Care, 16109- Manual therapy, [phone]- Gait training, [phone]- Canalith repositioning, Patient/Family education, Balance training, Vestibular training, and DME instructions  PLAN FOR NEXT SESSION:Work on parkinsons specific exercises - sit <> stands, try to work on gait with RW. Work on larger amplitude movements/standing balance   Set up RW if paitent brings in, blaze pod tasks with longer duration intervals for large amplitude movements, safety with RW and transfers Maybe early D/C on Friday since pt's dizziness is doing betteR?    Drake Leach, PT, DPT 10/04/2023, 9:31 AM

## 2023-10-08 ENCOUNTER — Ambulatory Visit: Payer: Medicare Other | Admitting: Physical Therapy

## 2023-10-08 ENCOUNTER — Encounter: Payer: Self-pay | Admitting: Physical Therapy

## 2023-10-08 ENCOUNTER — Encounter: Payer: Self-pay | Admitting: Nurse Practitioner

## 2023-10-08 DIAGNOSIS — R29898 Other symptoms and signs involving the musculoskeletal system: Secondary | ICD-10-CM

## 2023-10-08 DIAGNOSIS — R42 Dizziness and giddiness: Secondary | ICD-10-CM

## 2023-10-08 DIAGNOSIS — R2689 Other abnormalities of gait and mobility: Secondary | ICD-10-CM

## 2023-10-08 DIAGNOSIS — R293 Abnormal posture: Secondary | ICD-10-CM

## 2023-10-08 DIAGNOSIS — R29818 Other symptoms and signs involving the nervous system: Secondary | ICD-10-CM

## 2023-10-08 DIAGNOSIS — M6281 Muscle weakness (generalized): Secondary | ICD-10-CM

## 2023-10-08 NOTE — Therapy (Signed)
OUTPATIENT PHYSICAL THERAPY VESTIBULAR TREATMENT / DISCHARGE   PHYSICAL THERAPY DISCHARGE SUMMARY  Visits from Start of Care: 7  Current functional level related to goals / functional outcomes: Minimal gains with PT, see below   Remaining deficits: Falls risk - patient noncompliant with 2WW despite recommendation   Education / Equipment: Continue to use 2WW, continue HEP   Patient agrees to discharge. Patient goals were not met. Patient is being discharged due to lack of progress.   Patient Name: Brandi Walsh MRN: [id number] DOB:[birth year], 77 y.o., female Today's Date: 10/08/2023  END OF SESSION:  PT End of Session - 10/08/23 0849     Visit Number 7    Number of Visits 9    Date for PT Re-Evaluation 11/13/23    Authorization Type [employer] medicare    Progress Note Due on Visit 10    PT Start Time 0850    PT Stop Time 0914    PT Time Calculation (min) 24 min    Equipment Utilized During Treatment Gait belt    Activity Tolerance Patient tolerated treatment well    Behavior During Therapy [facility] for tasks assessed/performed             Past Medical History:  Diagnosis Date   Abnormal liver enzymes 09/19/2019   Blurred vision 09/19/2019   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   Conjunctivitis of both eyes 05/27/2023   Dementia likely due to Alzheimer's disease 07/06/2023   Dysuria 05/27/2023   Essential hypertension 10/13/2007   GERD (gastroesophageal reflux disease) 02/19/2012   Hypercalcemia 03/15/2019   Hyperlipidemia 11/11/2020   Localized edema 01/22/2017   Major depressive disorder 11/24/2018   Melena 05/06/2023   Menopausal syndrome 10/13/2007   Osteoporosis 10/13/2007   Polycythemia 12/03/2010   Primary hyperparathyroidism 06/12/2013   Type II diabetes mellitus 10/13/2007   Past Surgical History:  Procedure Laterality Date   ABDOMINAL HYSTERECTOMY     BIOPSY  05/08/2023   Procedure: BIOPSY;  Surgeon: Lynann Bologna, MD;  Location: [facility];  Service:  Gastroenterology;;   BREAST BIOPSY  08/2002   ESOPHAGOGASTRODUODENOSCOPY N/A 05/08/2023   Procedure: ESOPHAGOGASTRODUODENOSCOPY (EGD);  Surgeon: Lynann Bologna, MD;  Location: [facility];  Service: Gastroenterology;  Laterality: N/A;   TONSILLECTOMY     UPPER GASTROINTESTINAL ENDOSCOPY     Patient Active Problem List   Diagnosis Date Noted   Pneumonia due to infectious organism 09/24/2023   Proteinuria 09/09/2023   IDA (iron deficiency anemia) 09/09/2023   Hematuria 07/09/2023   Dementia likely due to Alzheimer's disease 07/06/2023   Dysuria 05/27/2023   Hyperlipidemia 11/11/2020   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   Abnormal liver enzymes 09/19/2019   Blurred vision 09/19/2019   Hypercalcemia 03/15/2019   Major depressive disorder 11/24/2018   Primary hyperparathyroidism 06/12/2013   GERD (gastroesophageal reflux disease) 02/19/2012   Polycythemia 12/03/2010   Type II diabetes mellitus 10/13/2007   Essential hypertension 10/13/2007   Osteoporosis 10/13/2007   Menopausal syndrome 10/13/2007    PCP: Elenore Paddy, NP  REFERRING PROVIDER:  [name], PA-C  REFERRING DIAG: G20.C (ICD-10-CM) - Parkinsonism, unspecified Parkinsonism type (HCC) R42 (ICD-10-CM) - Vertigo  THERAPY DIAG:  Dizziness and giddiness  Other abnormalities of gait and mobility  Muscle weakness (generalized)  Other symptoms and signs involving the nervous system  Abnormal posture  Other symptoms and signs involving the musculoskeletal system  ONSET DATE: 08/10/2023 (date of referral)  Rationale for Evaluation and Treatment: Rehabilitation  SUBJECTIVE:   SUBJECTIVE STATEMENT: Patient arrives to session without 2WW; states  she might have left it in the car. Patient is agreeable to discharge today. Denies acute changes/falls and near falls.  Pt accompanied by:  [name] (in waiting room)  PERTINENT HISTORY: PMH: HTN, GERD, DM, osteoporosis, parkinsonism   Pt underwent repeat  Neuropsychological testing with Dr. 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 No  There were no vitals filed for this visit.  PRECAUTIONS: Fall and Other: Osteoporosis  WEIGHT BEARING RESTRICTIONS: No  FALLS: Has patient fallen in last 6 months? No  LIVING ENVIRONMENT: Lives with:  lives with her husband and her granddaughter, pt's spouse is primary caregiver  Lives in: House/apartment - 2 stories  Stairs: Yes: External: 2 steps; on right going up Has following equipment at home: shower chair and Grab bars  PLOF: Independent with household mobility without device, Needs assistance with ADLs, Needs assistance with transfers, and pt's husband helps with bathing, dressing. Has needed help for at least 5 years  PATIENT GOALS: Wants to not be dizzy and wants to keep moving   OBJECTIVE:  Note: Objective measures were completed at Evaluation unless otherwise noted.  DIAGNOSTIC FINDINGS:  05/31/23 Brain MRI "IMPRESSION: 1. No evidence of an acute intracranial abnormality. 2. Mild chronic small vessel ischemic changes within the cerebral white matter, similar to the prior brain MRI of 07/22/2020. 3. There are a few chronic microhemorrhages within the supratentorial brain, as described and increased in number from the prior MRI. 4. Mild generalized cerebral atrophy."  COGNITION: Overall cognitive status: Hx of cognitive impairments/dementia per chart review     MUSCLE TONE:  Rigidity    POSTURE:  rounded shoulders, forward head, and increased thoracic kyphosis  RUE>LUE held into elbow flexion   Cervical ROM:    Limited cervical AROM in rotation and in extension    STRENGTH: Not formally assessed, but decr functional strength with sit <> stands    BED MOBILITY:  Pt reports her husband helps her get in and out of the bed.   During session with positional testing, pt  needing to provide mod A with sidelying <> sit   VESTIBULAR ASSESSMENT:  GENERAL OBSERVATION: Ambulates in with no AD- shuffled steps, bradykinetic, incr forward flexed posture   SYMPTOM BEHAVIOR:  Subjective history: See above, pt with vertigo >15 years   Non-Vestibular symptoms:  N/A  Type of dizziness: Spinning/Vertigo  Frequency: Daily when getting out of bed   Duration: Few minutes   Aggravating factors: Induced by position change: supine to sit  Relieving factors:  waiting for it to calm down   Progression of symptoms: unchanged  OCULOMOTOR EXAM:  Ocular Alignment: normal  Ocular ROM: No Limitations  Spontaneous Nystagmus: absent  Gaze-Induced Nystagmus: absent  Smooth Pursuits: intact and a little jumpy at times, cues to keep head still  Saccades: hypometric/undershoots  VESTIBULAR - OCULAR REFLEX:   Slow VOR: Normal  VOR Cancellation: Normal  Head-Impulse Test: HIT Right: unable to accurately assess due to guarding HIT Left: unable to accurately assess due to guarding Pt reports feeling dizzy and uncomfortable     POSITIONAL TESTING: Right Sidelying: performed 2 reps, performed on 2 pillows, first assessment pt with incr dizziness and needing to close her eyes, 2nd attempt no nystagmus noted with pt reporting less dizziness, still needing to initially close her eyes   Left Sidelying: performed 2 reps, first rep performed with single pillow and due to limited  cervical ROM, pt unable to rest head on pillow, pt reporting feeling dizzy and closing her eyes, therapist unable to open pt's eyes to see if there was any nystagmus. With 2nd re-assessment performed with 2nd pillow with pt able to rest head on pillow, pt reporting less dizziness and still closing her eyes, no nystagmus noted   Due to cognition, pt difficulty with explaining dizziness/vertigo  VESTIBULAR TREATMENT:                                                                                                     GAIT: Gait pattern: decreased arm swing- Right, decreased arm swing- Left, decreased step length- Right, decreased step length- Left, Right foot flat, Left foot flat, shuffling, decreased trunk rotation, trunk flexed, and narrow BOS Distance walked: pt ambulated into clinic with no AD Assistive device utilized: 2 wheeled RW used various short distances in session Level of assistance: Supervision with RW, CGA-SBA with no AD  Comments: Continues to not use 2WW despite education to both patient and spouse  TherAct (Goals Check):   [facility] PT Assessment - 10/08/23 0001       Standardized Balance Assessment   Standardized Balance Assessment Five Times Sit to Stand;Timed Up and Go Test;10 meter walk test    Five times sit to stand comments  22.84   seconds with pushup from chair with arms and without UE to sit (SBA)   10 Meter Walk 1.12   ft/sec with no AD and 1.02 ft.sec with 2WW (SBA)     Timed Up and Go Test   TUG Normal TUG    Normal TUG (seconds) 29.71   seconds without an AD (SBA)            PATIENT EDUCATION: Education details: Continue HEP Person educated: Patient and   Education method: Explanation, Demonstration, and Verbal cues Education comprehension: verbalized understanding, returned demonstration, and verbal cues required  HOME EXERCISE PROGRAM: Access Code: [email or web address] URL: [email or web address] Date: 09/14/2023 Prepared by: Sherlie Ban  Exercises - Brandt-Daroff Vestibular Exercise  - 2 x daily - 7 x weekly - 1 sets - 3-4 reps Verbally added trying repeated rolling at home to see if it helps with dizziness   Seated PWR Up, Rock, Step  See ortho PT notes for HEP given   GOALS: Goals reviewed with patient? Yes  SHORT TERM GOALS: ALL STGS = LTGS   LONG TERM GOALS: Target date: 10/12/2023   Pt will be independent with final HEP with family supervision for vertigo/Parkinsonism in order to build upon functional gains made in  therapy.  Baseline: Reports understanding of HEP Goal status: MET  2.  Pt will improve TUG time to 18 seconds or less in order to demo decrease fall risk.  Baseline: 21.2 seconds with no AD; 29.71 seconds with no AD Goal status: NOT MET  3.  Pt will report 3/10 or less dizziness with bed mobility.  Baseline: Reports no dizziness rolling in bed now Goal status: MET  4. Pt will improve 5x sit<>stand to less than or equal to 16.5 sec to demonstrate  improved functional strength and transfer efficiency.  Baseline: 18.9 seconds with no UE support; 22.84 with use of hands Goal status: NOT MET  5.  Pt will improve gait speed with LRAD to at least 1.6 ft/sec in order to demo improved household mobility/decr fall risk.  Baseline: 24.78 seconds with no AD = 1.32 ft/sec; 32.1 seconds with 2WW = 1.02 ft/sec and 29.4 seconds with no AD = 1.12 ft/sec Goal status: NOT MET    ASSESSMENT:  CLINICAL IMPRESSION: Patient is being discharge from skilled physical therapy due to a lack of functional change in status since starting PT. TUG, gait speed, and five time sit to stand have all decreased since starting HEP. Recommend continuing HEP at home. Patient non compliant with 2WW at this time despite therapist recommendation. Given lack of progress and response to PT intervention, recommend D/C at this time. Patient and caregiver in agreement.  .  OBJECTIVE IMPAIRMENTS: Abnormal gait, decreased activity tolerance, decreased balance, decreased cognition, decreased coordination, decreased endurance, decreased knowledge of use of DME, decreased mobility, difficulty walking, decreased strength, dizziness, hypomobility, impaired flexibility, and postural dysfunction.   ACTIVITY LIMITATIONS: carrying, lifting, bending, stairs, transfers, bed mobility, bathing, toileting, dressing, hygiene/grooming, locomotion level, and caring for others  PARTICIPATION LIMITATIONS: cleaning, laundry, driving, shopping, and  community activity  PERSONAL FACTORS: Age, Behavior pattern, Past/current experiences, Time since onset of injury/illness/exacerbation, and 3+ comorbidities: HTN, GERD, DM, osteoporosis, parkinsonism, major cognitive disorder  are also affecting patient's functional outcome.   REHAB POTENTIAL: Fair due to chronicity of condition/vertigo, pt with cognitive impairments  CLINICAL DECISION MAKING: Evolving/moderate complexity  EVALUATION COMPLEXITY: Moderate   PLAN:  PT FREQUENCY: 2x/week  PT DURATION: 8 weeks - anticipate just 4 weeks  PLANNED INTERVENTIONS: 97164- PT Re-evaluation, 97110-Therapeutic exercises, 97530- Therapeutic activity, [id number]- Neuromuscular re-education, 97535- Self Care, [id number]- Manual therapy, [phone]- Gait training, [phone]- Canalith repositioning, Patient/Family education, Balance training, Vestibular training, and DME instructions  PLAN FOR NEXT SESSION:NA - patient D/C with updated HEP  Carmelia Bake, PT, DPT 10/08/2023, 9:23 AM

## 2023-10-12 ENCOUNTER — Ambulatory Visit: Payer: Medicare Other | Admitting: Physical Therapy

## 2023-10-13 ENCOUNTER — Encounter: Payer: Self-pay | Admitting: Psychiatry

## 2023-10-14 ENCOUNTER — Ambulatory Visit: Payer: Medicare Other | Admitting: Nurse Practitioner

## 2023-10-15 ENCOUNTER — Ambulatory Visit: Payer: Medicare Other | Admitting: Physical Therapy

## 2023-10-21 ENCOUNTER — Ambulatory Visit (HOSPITAL_COMMUNITY)
Admission: RE | Admit: 2023-10-21 | Discharge: 2023-10-21 | Disposition: A | Discharge status: Home / Self Care | Payer: Medicare Other | Source: Ambulatory Visit | Attending: Nephrology | Admitting: Nephrology

## 2023-10-21 DIAGNOSIS — N189 Chronic kidney disease, unspecified: Secondary | ICD-10-CM | POA: Diagnosis not present

## 2023-10-21 DIAGNOSIS — D631 Anemia in chronic kidney disease: Secondary | ICD-10-CM | POA: Insufficient documentation

## 2023-10-21 DIAGNOSIS — D509 Iron deficiency anemia, unspecified: Secondary | ICD-10-CM | POA: Diagnosis present

## 2023-10-21 MED ORDER — SODIUM CHLORIDE 0.9 % IV SOLN
510.0000 mg | INTRAVENOUS | Status: DC
  Administered 2023-10-21: 510 mg via INTRAVENOUS
  Filled 2023-10-21: qty 510

## 2023-11-01 ENCOUNTER — Encounter (HOSPITAL_COMMUNITY): Payer: Medicare Other

## 2023-11-06 ENCOUNTER — Other Ambulatory Visit: Payer: Self-pay | Admitting: Family

## 2023-11-15 ENCOUNTER — Ambulatory Visit: Payer: Medicare Other | Admitting: Physician Assistant

## 2023-11-18 ENCOUNTER — Encounter (HOSPITAL_COMMUNITY)
Admission: RE | Admit: 2023-11-18 | Discharge: 2023-11-18 | Disposition: A | Discharge status: Home / Self Care | Payer: Medicare Other | Source: Ambulatory Visit | Attending: Nephrology | Admitting: Nephrology

## 2023-11-18 DIAGNOSIS — D631 Anemia in chronic kidney disease: Secondary | ICD-10-CM | POA: Insufficient documentation

## 2023-11-18 DIAGNOSIS — N189 Chronic kidney disease, unspecified: Secondary | ICD-10-CM | POA: Diagnosis present

## 2023-11-18 MED ORDER — SODIUM CHLORIDE 0.9 % IV SOLN
510.0000 mg | INTRAVENOUS | Status: DC
  Administered 2023-11-18: 510 mg via INTRAVENOUS
  Filled 2023-11-18: qty 510

## 2023-11-20 ENCOUNTER — Encounter: Payer: Self-pay | Admitting: Nurse Practitioner

## 2023-11-20 ENCOUNTER — Other Ambulatory Visit: Payer: Self-pay | Admitting: Family

## 2023-11-22 ENCOUNTER — Other Ambulatory Visit: Payer: Self-pay

## 2023-11-22 MED ORDER — ROSUVASTATIN CALCIUM 40 MG PO TABS: 40.0000 mg | ORAL_TABLET | Freq: Every day | ORAL | 3 refills | Status: DC

## 2023-12-03 ENCOUNTER — Ambulatory Visit: Payer: Medicare Other | Admitting: Psychiatry

## 2023-12-06 ENCOUNTER — Other Ambulatory Visit: Payer: Self-pay | Admitting: *Deleted

## 2023-12-06 ENCOUNTER — Ambulatory Visit: Payer: Medicare Other | Admitting: Psychiatry

## 2023-12-06 DIAGNOSIS — D509 Iron deficiency anemia, unspecified: Secondary | ICD-10-CM

## 2023-12-06 NOTE — Progress Notes (Signed)
 Left message for patient caregiver/grandfather, Dedra that patient is due for repeat labwork this week. Advised orders are already in computer so no appointment is necessary.   It appears patient has had iron infusions since her last labs with our office.

## 2023-12-06 NOTE — Progress Notes (Signed)
-----   Message ----- From: Richardson Chiquito, RN Sent: 11/29/2023  12:00 AM EST To: Richardson Chiquito, RN  Pt needs repeat cbc, ibc panel around 12/07/23. Enter in epic. Let pt know (probably will talk to granddaughter/caregiver carmen) cirig pt

## 2023-12-10 ENCOUNTER — Ambulatory Visit: Payer: Medicare Other | Admitting: Nurse Practitioner

## 2023-12-10 VITALS — BP 110/72 | HR 80 | Temp 97.7°F | Ht 60.0 in | Wt 155.1 lb

## 2023-12-10 DIAGNOSIS — H9390 Unspecified disorder of ear, unspecified ear: Secondary | ICD-10-CM | POA: Diagnosis not present

## 2023-12-10 DIAGNOSIS — E785 Hyperlipidemia, unspecified: Secondary | ICD-10-CM

## 2023-12-10 DIAGNOSIS — D509 Iron deficiency anemia, unspecified: Secondary | ICD-10-CM

## 2023-12-10 LAB — LIPID PANEL
Cholesterol: 97 mg/dL (ref 0–200)
HDL: 28.9 mg/dL — ABNORMAL LOW (ref >39.00)
LDL Cholesterol: 46 mg/dL (ref 0–99)
NonHDL: 68.57
Total CHOL/HDL Ratio: 3
Triglycerides: 114 mg/dL (ref 0.0–149.0)
VLDL: 22.8 mg/dL (ref 0.0–40.0)

## 2023-12-10 LAB — COMPREHENSIVE METABOLIC PANEL
ALT: 57 U/L — ABNORMAL HIGH (ref 0–35)
AST: 44 U/L — ABNORMAL HIGH (ref 0–37)
Albumin: 3.9 g/dL (ref 3.5–5.2)
Alkaline Phosphatase: 90 U/L (ref 39–117)
BUN: 12 mg/dL (ref 6–23)
CO2: 27 meq/L (ref 19–32)
Calcium: 10.1 mg/dL (ref 8.4–10.5)
Chloride: 106 meq/L (ref 96–112)
Creatinine, Ser: 0.89 mg/dL (ref 0.40–1.20)
GFR: 62.3 mL/min (ref >60.00)
Glucose, Bld: 151 mg/dL — ABNORMAL HIGH (ref 70–99)
Potassium: 4.3 meq/L (ref 3.5–5.1)
Sodium: 140 meq/L (ref 135–145)
Total Bilirubin: 0.8 mg/dL (ref 0.2–1.2)
Total Protein: 7.2 g/dL (ref 6.0–8.3)

## 2023-12-10 LAB — CBC WITH DIFFERENTIAL/PLATELET
Basophils Absolute: 0.1 10*3/uL (ref 0.0–0.1)
Basophils Relative: 0.9 % (ref 0.0–3.0)
Eosinophils Absolute: 0.3 10*3/uL (ref 0.0–0.7)
Eosinophils Relative: 4 % (ref 0.0–5.0)
HCT: 43.9 % (ref 36.0–46.0)
Hemoglobin: 14.6 g/dL (ref 12.0–15.0)
Lymphocytes Relative: 21.3 % (ref 12.0–46.0)
Lymphs Abs: 1.5 10*3/uL (ref 0.7–4.0)
MCHC: 33.2 g/dL (ref 30.0–36.0)
MCV: 84.4 fL (ref 78.0–100.0)
Monocytes Absolute: 0.5 10*3/uL (ref 0.1–1.0)
Monocytes Relative: 7.3 % (ref 3.0–12.0)
Neutro Abs: 4.6 10*3/uL (ref 1.4–7.7)
Neutrophils Relative %: 66.5 % (ref 43.0–77.0)
Platelets: 198 10*3/uL (ref 150.0–400.0)
RBC: 5.2 Mil/uL — ABNORMAL HIGH (ref 3.87–5.11)
RDW: 24.3 % — ABNORMAL HIGH (ref 11.5–15.5)
WBC: 7 10*3/uL (ref 4.0–10.5)

## 2023-12-10 LAB — IBC PANEL
Iron: 127 ug/dL (ref 42–145)
Saturation Ratios: 48.3 % (ref 20.0–50.0)
TIBC: 263.2 ug/dL (ref 250.0–450.0)
Transferrin: 188 mg/dL — ABNORMAL LOW (ref 212.0–360.0)

## 2023-12-10 MED ORDER — MUPIROCIN 2 % EX OINT: 1.0000 | TOPICAL_OINTMENT | Freq: Two times a day (BID) | CUTANEOUS | 0 refills | Status: AC

## 2023-12-10 NOTE — Progress Notes (Signed)
 Established Patient Office Visit  Subjective   Patient ID: Brandi Walsh, female    DOB: February 18, 1946  Age: 78 y.o. MRN: [id number]  Chief Complaint  Patient presents with   Anemia    Anemia: Following with GI for iron deficiency anemia.  Has undergone multiple Feraheme infusions.  Is due to have labs per GI recommendation, however patient is here today with prefer to have labs drawn today in 1 place as opposed to going to GI lab.  Overall feels stable.  GERD/history of GI bleed: Previously we discussed trialing stopping pantoprazole .  Patient does have a history of GI bleed secondary to NSAID use.  She is not using NSAIDs currently does not plan on restarting at this time.  No pain abdomen or blood in stool.  She is currently taking pantoprazole  40 mg daily would like to try reducing to pantoprazole  every other day with possible plan to discontinue altogether.  Pneumonia: Recently treated with antibiotics, has recovered well no reoccurrence of symptoms.   Hyperlipidemia: Chronic, last fasting lipid panel collected over a year ago.  Currently on pravastatin 40 mg daily and tolerating well.    ROS: see HPI    Objective:     BP 110/72   Pulse 80   Temp 97.7 F (36.5 C) (Temporal)   Ht 5' (1.524 m)   Wt 155 lb 2 oz (70.4 kg)   BMI 30.30 kg/m    Physical Exam Vitals reviewed.  Constitutional:      General: She is not in acute distress.    Appearance: Normal appearance.  HENT:     Head: Normocephalic and atraumatic.     Ears:   Neck:     Vascular: No carotid bruit.  Cardiovascular:     Rate and Rhythm: Normal rate and regular rhythm.     Pulses: Normal pulses.     Heart sounds: Normal heart sounds.  Pulmonary:     Effort: Pulmonary effort is normal.     Breath sounds: Normal breath sounds.  Skin:    General: Skin is warm and dry.  Neurological:     General: No focal deficit present.     Mental Status: She is alert and oriented to person, place, and time.   Psychiatric:        Mood and Affect: Mood normal.        Behavior: Behavior normal.        Judgment: Judgment normal.      No results found for any visits on 12/10/23.    The ASCVD Risk score (Arnett DK, et al., 2019) failed to calculate for the following reasons:   The valid total cholesterol range is 130 to 320 mg/dL    Assessment & Plan:   Problem List Items Addressed This Visit       Nervous and Auditory   Ear lesion   Chronic, noted on exam Per granddaughter this lesion has been present for years and seems to occur with excessive pressure to the right ear from patient sleeping on her right side chronically.  She does try to encourage patient to switch positions at night to offload pressure and sometimes they will treat with Vaseline or Neosporin, she denies any changes acutely to the lesion.  For now we will not refer to dermatology as this does appear to be pressure induced injury.  I encouraged offloading pressure with frequent position changes and will prescribe mupirocin  ointment that can be applied twice a day if any evidence of  infection occur.  Patient was educated that evidence of infection includes redness, swelling, drainage.  They report understanding.      Relevant Medications   mupirocin  ointment (BACTROBAN ) 2 %     Other   Hyperlipidemia   Chronic Check fasting lipid panel today, for now continue rosuvastatin  40mg /day      Relevant Orders   Lipid panel   Comprehensive metabolic panel   IDA (iron deficiency anemia) - Primary   Chronic Check labs as ordered by Dr. San and forward results to him Further recommendations to be deferred to GI      Relevant Orders   IBC panel   CBC with Differential/Platelet    Return in about 4 months (around 04/20/2024) for F/U with Lauraine.    Lauraine FORBES Pereyra, NP

## 2023-12-10 NOTE — Assessment & Plan Note (Signed)
 Chronic, noted on exam Per granddaughter this lesion has been present for years and seems to occur with excessive pressure to the right ear from patient sleeping on her right side chronically.  She does try to encourage patient to switch positions at night to offload pressure and sometimes they will treat with Vaseline or Neosporin, she denies any changes acutely to the lesion.  For now we will not refer to dermatology as this does appear to be pressure induced injury.  I encouraged offloading pressure with frequent position changes and will prescribe mupirocin  ointment that can be applied twice a day if any evidence of infection occur.  Patient was educated that evidence of infection includes redness, swelling, drainage.  They report understanding.

## 2023-12-10 NOTE — Assessment & Plan Note (Signed)
 Chronic Check fasting lipid panel today, for now continue rosuvastatin 40mg /day

## 2023-12-10 NOTE — Assessment & Plan Note (Signed)
 Chronic Check labs as ordered by Dr. Barron Alvine and forward results to him Further recommendations to be deferred to GI

## 2023-12-11 ENCOUNTER — Encounter: Payer: Self-pay | Admitting: Nurse Practitioner

## 2023-12-14 ENCOUNTER — Other Ambulatory Visit: Payer: Self-pay | Admitting: Nurse Practitioner

## 2023-12-14 DIAGNOSIS — F419 Anxiety disorder, unspecified: Secondary | ICD-10-CM

## 2023-12-14 DIAGNOSIS — F3342 Major depressive disorder, recurrent, in full remission: Secondary | ICD-10-CM

## 2023-12-14 MED ORDER — ESCITALOPRAM OXALATE 10 MG PO TABS: 15.0000 mg | ORAL_TABLET | Freq: Every day | ORAL | 2 refills | Status: DC

## 2023-12-15 ENCOUNTER — Telehealth: Payer: Self-pay | Admitting: Gastroenterology

## 2023-12-15 ENCOUNTER — Other Ambulatory Visit: Payer: Self-pay

## 2023-12-15 ENCOUNTER — Encounter: Payer: Self-pay | Admitting: Nurse Practitioner

## 2023-12-15 DIAGNOSIS — R748 Abnormal levels of other serum enzymes: Secondary | ICD-10-CM

## 2023-12-15 NOTE — Telephone Encounter (Signed)
 Good afternoon Dr. Karene Oto,    I received a call from this patient grandaughter requesting to transfer over her grandmother's care over to Dr. Venice Gillis. Patient granddaughter stated that Dr. Venice Gillis was her grandmother in the hospital and they would like to be able to make an appointment with Dr. Venice Gillis. Would you please advise of this.    Thank you.

## 2023-12-16 NOTE — Telephone Encounter (Signed)
Good Morning Dr. Chales Abrahams,   I received a call from this patient grandaughter requesting to transfer over her grandmother's care over to you. Patient granddaughter stated that her grandmother was seen by you in the hospital and they would like to be able to make an appointment with you. I sent over a requesting to Dr. Barron Alvine and he stated it was okay with him for her to transfer her care to you.Would you please advise of this.     Thank you.

## 2023-12-17 ENCOUNTER — Encounter (HOSPITAL_COMMUNITY): Payer: Self-pay | Admitting: *Deleted

## 2023-12-17 ENCOUNTER — Other Ambulatory Visit: Payer: Self-pay

## 2023-12-17 ENCOUNTER — Emergency Department (HOSPITAL_COMMUNITY)
Admission: EM | Admit: 2023-12-17 | Discharge: 2023-12-17 | Disposition: A | Discharge status: Home / Self Care | Payer: Medicare Other | Attending: Emergency Medicine | Admitting: Emergency Medicine

## 2023-12-17 DIAGNOSIS — R748 Abnormal levels of other serum enzymes: Secondary | ICD-10-CM | POA: Diagnosis not present

## 2023-12-17 DIAGNOSIS — R195 Other fecal abnormalities: Secondary | ICD-10-CM

## 2023-12-17 DIAGNOSIS — K921 Melena: Secondary | ICD-10-CM | POA: Diagnosis present

## 2023-12-17 DIAGNOSIS — E119 Type 2 diabetes mellitus without complications: Secondary | ICD-10-CM | POA: Insufficient documentation

## 2023-12-17 DIAGNOSIS — F0390 Unspecified dementia without behavioral disturbance: Secondary | ICD-10-CM | POA: Insufficient documentation

## 2023-12-17 LAB — COMPREHENSIVE METABOLIC PANEL
ALT: 92 U/L — ABNORMAL HIGH (ref 0–44)
AST: 80 U/L — ABNORMAL HIGH (ref 15–41)
Albumin: 3.7 g/dL (ref 3.5–5.0)
Alkaline Phosphatase: 81 U/L (ref 38–126)
Anion gap: 9 (ref 5–15)
BUN: 14 mg/dL (ref 8–23)
CO2: 24 mmol/L (ref 22–32)
Calcium: 10.2 mg/dL (ref 8.9–10.3)
Chloride: 105 mmol/L (ref 98–111)
Creatinine, Ser: 0.87 mg/dL (ref 0.44–1.00)
GFR, Estimated: >60 mL/min (ref >60)
Glucose, Bld: 146 mg/dL — ABNORMAL HIGH (ref 70–99)
Potassium: 3.8 mmol/L (ref 3.5–5.1)
Sodium: 138 mmol/L (ref 135–145)
Total Bilirubin: 1.1 mg/dL (ref 0.0–1.2)
Total Protein: 7.2 g/dL (ref 6.5–8.1)

## 2023-12-17 LAB — TYPE AND SCREEN
ABO/RH(D): B POS
Antibody Screen: NEGATIVE

## 2023-12-17 LAB — PROTIME-INR
INR: 1.1 (ref 0.8–1.2)
Prothrombin Time: 14.3 s (ref 11.4–15.2)

## 2023-12-17 LAB — CBC
HCT: 45.4 % (ref 36.0–46.0)
Hemoglobin: 14.7 g/dL (ref 12.0–15.0)
MCH: 27.7 pg (ref 26.0–34.0)
MCHC: 32.4 g/dL (ref 30.0–36.0)
MCV: 85.5 fL (ref 80.0–100.0)
Platelets: 206 10*3/uL (ref 150–400)
RBC: 5.31 MIL/uL — ABNORMAL HIGH (ref 3.87–5.11)
RDW: 20.3 % — ABNORMAL HIGH (ref 11.5–15.5)
WBC: 7.2 10*3/uL (ref 4.0–10.5)
nRBC: 0 % (ref 0.0–0.2)

## 2023-12-17 LAB — POC OCCULT BLOOD, ED: Fecal Occult Bld: NEGATIVE

## 2023-12-17 NOTE — ED Triage Notes (Signed)
BIB family from home for black stool at 6am this am, "unusual for pt". H/o GI bleed in June 2024. Still taking pantoprazole daily. Family at [facility] states, "Pt does not usually complain of any sx". Alert, NAD, calm. H/o alzheimers dementia, and DM. H/o iron infusions and abnormal liver labs.

## 2023-12-17 NOTE — Discharge Instructions (Addendum)
The stool sample did not show any blood in your stool.  The dark stool could have been due to diet.  However, I recommend following up with GI within the next month to discuss this as well as discussed her elevated liver enzymes.  Your blood counts are normal today.  Return to the ER for any weakness, black stools, unexplained fevers, any other new or concerning symptoms

## 2023-12-17 NOTE — ED Provider Notes (Signed)
[facility] AT [facility] Provider Note   CSN: [id number] Arrival date & time: 12/17/23  0813     History  Chief Complaint  Patient presents with   Melena    Brandi Walsh is a 78 y.o. female with history of dementia, type 2 diabetes, GI bleed last year, brought in by family who is concerned for a dark stool this morning.  Reports this only occurred once, and then her next stool was more loose but not black.  Reports there may have been some red specks in the stool.  Denies any new weakness.  Patient is on a blood thinners  HPI     Home Medications Prior to Admission medications   Medication Sig Start Date End Date Taking? Authorizing Provider  alendronate (FOSAMAX) 70 MG tablet Take 70 mg by mouth once a week. 03/31/21   [provider]  Cholecalciferol (VITAMIN D) 50 MCG (2000 UT) tablet Take 2,000 Units by mouth daily.    [provider]  donepezil (ARICEPT) 5 MG tablet Take 1 tablet daily 09/07/23   Van Clines, MD  escitalopram (LEXAPRO) 10 MG tablet Take 1.5 tablets (15 mg total) by mouth daily. 12/14/23   Elenore Paddy, NP  losartan (COZAAR) 50 MG tablet Take 1 tablet (50 mg total) by mouth daily. 10/06/22   [name], FNP  mupirocin ointment (BACTROBAN) 2 % Apply 1 Application topically 2 (two) times daily. 12/10/23   Elenore Paddy, NP  pantoprazole (PROTONIX) 40 MG tablet Take 40 mg by mouth every other day.    [provider]  rosuvastatin (CRESTOR) 40 MG tablet Take 1 tablet (40 mg total) by mouth daily. 11/22/23   Elenore Paddy, NP      Allergies    Lovastatin    Review of Systems   Review of Systems  Neurological:  Negative for weakness.    Physical Exam Updated Vital Signs BP 123/69 (BP Location: Left Arm)   Pulse 64   Temp 98.1 F (36.7 C) (Oral)   Resp 15   Wt 70.3 kg   SpO2 100%   BMI 30.27 kg/m  Physical Exam Vitals and nursing note reviewed. Exam conducted with a chaperone  present.  Constitutional:      Appearance: Normal appearance.  HENT:     Head: Atraumatic.  Cardiovascular:     Rate and Rhythm: Normal rate and regular rhythm.  Pulmonary:     Effort: Pulmonary effort is normal.     Comments: Lungs clear to auscultation bilaterally Abdominal:     General: Abdomen is flat.     Palpations: Abdomen is soft.     Tenderness: There is no abdominal tenderness.  Genitourinary:    Comments: Nurse Heron Nay present to chaperone exam   Large external nonthrombosed hemorrhoid, no active bleeding Stool sample obtained, stool appears brown   Neurological:     General: No focal deficit present.     Mental Status: She is alert.  Psychiatric:        Mood and Affect: Mood normal.        Behavior: Behavior normal.     ED Results / Procedures / Treatments   Labs (all labs ordered are listed, but only abnormal results are displayed) Labs Reviewed  COMPREHENSIVE METABOLIC PANEL - Abnormal; Notable for the following components:      Result Value   Glucose, Bld 146 (*)    AST 80 (*)    ALT 92 (*)  All other components within normal limits  CBC - Abnormal; Notable for the following components:   RBC 5.31 (*)    RDW 20.3 (*)    All other components within normal limits  PROTIME-INR  POC OCCULT BLOOD, ED  TYPE AND SCREEN    EKG None  Radiology No results found.  Procedures Procedures    Medications Ordered in ED Medications - No data to display  ED Course/ Medical Decision Making/ A&P                                 Medical Decision Making Amount and/or Complexity of Data Reviewed Labs: ordered.     Differential diagnosis includes but is not limited to upper GI bleeding, hemorrhoid, diverticulitis, diet change  ED Course:  Patient well-appearing, stable vital signs.  Family concerned about a dark stool seen this morning.  Upon rectal exam, stool appears brown. I Ordered, and personally interpreted labs.  The pertinent results  include:  Hemoccult negative.  CBC without any drop in hemoglobin.  Low concern for any GI bleed at this time.  CMP does show elevated liver enzymes, but patient's family is aware and they state they have follow-up for this.   Impression: Dark stool  Disposition:  The patient was discharged home with instructions to follow-up with GI regarding her elevated liver enzymes and concern for dark stools for the next month. Return precautions given.            Final Clinical Impression(s) / ED Diagnoses Final diagnoses:  Dark stools  Elevated liver enzymes    Rx / DC Orders ED Discharge Orders     None         Arabella Merles, PA-C 12/17/23 1607    Bethann Berkshire, MD 12/18/23 1144

## 2023-12-17 NOTE — ED Provider Triage Note (Signed)
Emergency Medicine Provider Triage Evaluation Note  Brandi Walsh , a 78 y.o. female  was evaluated in triage.  Pt complains of black stools.  Patient has dementia and is not sure why she is here.  Granddaughter at the bedside notes that the patient's husband noted black stool when she went to the bathroom this morning.  No complaints of pain.  Is not on blood thinners.  History of a GI bleed last summer.  Recently had some abnormal LFTs that have been followed as an outpatient.  Receives iron infusions.  Review of Systems  Positive: Black stool Negative: Chest pain, abdominal pain, fever  Physical Exam  BP (!) 143/67 (BP Location: Left Arm)   Pulse 67   Temp 98 F (36.7 C) (Oral)   Resp 18   Wt 70.3 kg   SpO2 98%   BMI 30.27 kg/m  Gen:   Awake, no distress   Resp:  Normal effort  Abd:  No tenderness  Medical Decision Making  Medically screening exam initiated at 9:06 AM.  Appropriate orders placed.  Brandi Walsh was informed that the remainder of the evaluation will be completed by another provider, this initial triage assessment does not replace that evaluation, and the importance of remaining in the ED until their evaluation is complete.  Patient is hemodynamically stable.  Labs ordered.   Pricilla Loveless, MD 12/17/23 [phone]

## 2023-12-18 ENCOUNTER — Encounter: Payer: Self-pay | Admitting: Nurse Practitioner

## 2023-12-20 ENCOUNTER — Other Ambulatory Visit: Payer: Self-pay

## 2023-12-20 MED ORDER — LOSARTAN POTASSIUM 50 MG PO TABS: 50.0000 mg | ORAL_TABLET | Freq: Every day | ORAL | 3 refills | Status: DC

## 2023-12-22 NOTE — Telephone Encounter (Signed)
OK by me RG 

## 2023-12-23 ENCOUNTER — Telehealth: Payer: Self-pay | Admitting: Gastroenterology

## 2023-12-23 DIAGNOSIS — D509 Iron deficiency anemia, unspecified: Secondary | ICD-10-CM

## 2023-12-23 DIAGNOSIS — R748 Abnormal levels of other serum enzymes: Secondary | ICD-10-CM

## 2023-12-23 NOTE — Telephone Encounter (Signed)
Please advise. I dont know anything about this patient

## 2023-12-23 NOTE — Telephone Encounter (Signed)
Per most recent notes under labs, looks like patient's granddaughter was advised repeat Hepatic panel should be drawn around 12/29/23 (orders are in [facility] for this). However, it appears that patient has transferred her care over to Dr Chales Abrahams, so he may need to advise if he would like anything different.

## 2023-12-23 NOTE — Telephone Encounter (Signed)
Inbound call from patients granddaughter requesting a call to discuss when she needs to repeat her labs. Please advise.

## 2023-12-23 NOTE — Telephone Encounter (Signed)
Dr Chales Abrahams can you advise regarding this patient please

## 2023-12-28 NOTE — Telephone Encounter (Signed)
Lets do CBC, CMP, INR Mid Feb 2025 RG

## 2023-12-29 NOTE — Telephone Encounter (Signed)
Granddaughter made aware and voiced understanding. Labs placed

## 2024-01-13 ENCOUNTER — Encounter: Payer: Self-pay | Admitting: Nurse Practitioner

## 2024-01-21 ENCOUNTER — Other Ambulatory Visit: Payer: Medicare Other

## 2024-01-21 ENCOUNTER — Other Ambulatory Visit: Payer: Self-pay | Admitting: Nurse Practitioner

## 2024-01-21 DIAGNOSIS — R748 Abnormal levels of other serum enzymes: Secondary | ICD-10-CM

## 2024-01-21 LAB — COMPREHENSIVE METABOLIC PANEL
ALT: 39 U/L — ABNORMAL HIGH (ref 0–35)
AST: 38 U/L — ABNORMAL HIGH (ref 0–37)
Albumin: 3.7 g/dL (ref 3.5–5.2)
Alkaline Phosphatase: 80 U/L (ref 39–117)
BUN: 15 mg/dL (ref 6–23)
CO2: 31 meq/L (ref 19–32)
Calcium: 10.2 mg/dL (ref 8.4–10.5)
Chloride: 105 meq/L (ref 96–112)
Creatinine, Ser: 0.93 mg/dL (ref 0.40–1.20)
GFR: 59.05 mL/min — ABNORMAL LOW (ref >60.00)
Glucose, Bld: 199 mg/dL — ABNORMAL HIGH (ref 70–99)
Potassium: 4.2 meq/L (ref 3.5–5.1)
Sodium: 139 meq/L (ref 135–145)
Total Bilirubin: 0.9 mg/dL (ref 0.2–1.2)
Total Protein: 7.2 g/dL (ref 6.0–8.3)

## 2024-01-21 LAB — CBC WITH DIFFERENTIAL/PLATELET
Basophils Absolute: 0.1 10*3/uL (ref 0.0–0.1)
Basophils Relative: 0.9 % (ref 0.0–3.0)
Eosinophils Absolute: 0.1 10*3/uL (ref 0.0–0.7)
Eosinophils Relative: 1.3 % (ref 0.0–5.0)
HCT: 45.5 % (ref 36.0–46.0)
Hemoglobin: 15.2 g/dL — ABNORMAL HIGH (ref 12.0–15.0)
Lymphocytes Relative: 24 % (ref 12.0–46.0)
Lymphs Abs: 1.9 10*3/uL (ref 0.7–4.0)
MCHC: 33.5 g/dL (ref 30.0–36.0)
MCV: 89 fL (ref 78.0–100.0)
Monocytes Absolute: 0.5 10*3/uL (ref 0.1–1.0)
Monocytes Relative: 6.7 % (ref 3.0–12.0)
Neutro Abs: 5.3 10*3/uL (ref 1.4–7.7)
Neutrophils Relative %: 67.1 % (ref 43.0–77.0)
Platelets: 187 10*3/uL (ref 150.0–400.0)
RBC: 5.11 Mil/uL (ref 3.87–5.11)
RDW: 19.5 % — ABNORMAL HIGH (ref 11.5–15.5)
WBC: 8 10*3/uL (ref 4.0–10.5)

## 2024-01-21 LAB — PROTIME-INR
INR: 1.2 {ratio} — ABNORMAL HIGH (ref 0.8–1.0)
Prothrombin Time: 12.2 s (ref 9.6–13.1)

## 2024-01-25 ENCOUNTER — Encounter: Payer: Self-pay | Admitting: Physician Assistant

## 2024-01-25 ENCOUNTER — Ambulatory Visit: Payer: Medicare Other | Admitting: Physician Assistant

## 2024-01-25 VITALS — BP 131/88 | HR 76 | Resp 18 | Ht 60.0 in | Wt 159.0 lb

## 2024-01-25 DIAGNOSIS — G309 Alzheimer's disease, unspecified: Secondary | ICD-10-CM | POA: Diagnosis not present

## 2024-01-25 DIAGNOSIS — F0280 Dementia in other diseases classified elsewhere without behavioral disturbance: Secondary | ICD-10-CM | POA: Diagnosis not present

## 2024-01-25 MED ORDER — DONEPEZIL HCL 5 MG PO TABS: ORAL_TABLET | ORAL | 3 refills | Status: DC

## 2024-01-25 NOTE — Patient Instructions (Addendum)
 Good to see you.  Donepezil 5 mg daily  Make sure to use a cane to prevent fall  2. Follow-up in 6 months with  Dr. Karel Jarvis 07/24/2024  then with me in 1 year   FALL PRECAUTIONS: Be cautious when walking. Scan the area for obstacles that may increase the risk of trips and falls. When getting up in the mornings, sit up at the edge of the bed for a few minutes before getting out of bed. Consider elevating the bed at the head end to avoid drop of blood pressure when getting up. Walk always in a well-lit room (use night lights in the walls). Avoid area rugs or power cords from appliances in the middle of the walkways. Use a walker or a cane if necessary and consider physical therapy for balance exercise. Get your eyesight checked regularly.   HOME SAFETY: Consider the safety of the kitchen when operating appliances like stoves, microwave oven, and blender. Consider having supervision and share cooking responsibilities until no longer able to participate in those. Accidents with firearms and other hazards in the house should be identified and addressed as well.   ABILITY TO BE LEFT ALONE: If patient is unable to contact 911 operator, consider using LifeLine, or when the need is there, arrange for someone to stay with patients. Smoking is a fire hazard, consider supervision or cessation. Risk of wandering should be assessed by caregiver and if detected at any point, supervision and safe proof recommendations should be instituted.  RECOMMENDATIONS FOR ALL PATIENTS WITH MEMORY PROBLEMS: 1. Continue to exercise (Recommend 30 minutes of walking everyday, or 3 hours every week) 2. Increase social interactions - continue going to [place] and enjoy social gatherings with friends and family 3. Eat healthy, avoid fried foods and eat more fruits and vegetables 4. Maintain adequate blood pressure, blood sugar, and blood cholesterol level. Reducing the risk of stroke and cardiovascular disease also helps promoting  better memory. 5. Avoid stressful situations. Live a simple life and avoid aggravations. Organize your time and prepare for the next day in anticipation. 6. Sleep well, avoid any interruptions of sleep and avoid any distractions in the bedroom that may interfere with adequate sleep quality 7. Avoid sugar, avoid sweets as there is a strong link between excessive sugar intake, diabetes, and cognitive impairment The Mediterranean diet has been shown to help patients reduce the risk of progressive memory disorders and reduces cardiovascular risk. This includes eating fish, eat fruits and green leafy vegetables, nuts like almonds and hazelnuts, walnuts, and also use olive oil. Avoid fast foods and fried foods as much as possible. Avoid sweets and sugar as sugar use has been linked to worsening of memory function.  There is always a concern of gradual progression of memory problems. If this is the case, then we may need to adjust level of care according to patient needs. Support, both to the patient and caregiver, should then be put into place.

## 2024-01-25 NOTE — Progress Notes (Signed)
 Assessment/Plan:   Dementia likely due to Alzheimer's disease  Brandi Walsh Walsh is a very pleasant 78 y.o. LH female with a history of hypertension, hyperlipidemia, depression, iron deficiency anemia, anxiety and dementia likely due to Alzheimer's disease by repeat neuropsych evaluation August 2024. She is seen today in follow up for memory loss. Patient is currently on donepezil 5 mg daily after being unable to tolerate 10 mg daily. Performance is stable, with MMSE 27/30. Patient is able to participate on ADLs, no longer drives.Mood is good.    Follow up in 6  months, she has an appt with Dr. Karel Walsh.  Continue donepezil 5 mg daily, side effects discussed Recommend good control of her cardiovascular risk factors Continue physical therapy for strength and balance Continue to control mood as per PCP    Subjective:    This patient is accompanied in the office by the granddaughter who supplements the history.  Previous records as well as any outside records available were reviewed prior to todays visit. Patient was last seen on 07/23/23.    Any changes in memory since last visit? " About  the same".  She may have some issues with short-term memory, presenting with recent conversations and names, her granddaughter agrees.  LTM is good.  repeats oneself?  "Not a lot" Disoriented when walking into a room?  Patient denies    Leaving objects?  May misplace things but not in unusual places   Wandering behavior?  Denies.   Any personality changes since last visit?  Denies.   Any worsening depression?:  Denies.   Hallucinations or paranoia?  Denies.   Seizures? denies    Any sleep changes?  Sleeps well. Denies vivid dreams, REM behavior or sleepwalking   Sleep apnea?   Denies.   Any hygiene concerns?  Endorsed, she is scared of showering, her husband gives her sponge bath. Independent of bathing and dressing?  Needs assistance. Does the patient needs help with medications?  Granddaughter is  in charge   Who is in charge of the finances?   Family is in charge     Any changes in appetite?  Denies.     Patient have trouble swallowing?  Sometimes she may have difficulty with swallowing with solid.  In the past, she has had esophageal dilatation. Does the patient cook? No, family is in charge. Any headaches?   Denies.   Chronic back pain  denies.   Ambulates with difficulty?  She had physical therapy in the recent past.  She likes to walk with her husband weather permitting. Recent falls or head injuries? denies.     Unilateral weakness, numbness or tingling? denies. Any tremors?  Denies   Any anosmia?  Denies   Any incontinence of urine?  Endorsed, wears diapers Any bowel dysfunction?   She has issues with GI bleed, with an episode of dark stool on 12/17/2023 evaluated at the ED. Unclear etiology   Patient lives with her husband, granddaughter and 2 great-grandchildren  Does the patient drive? No longer drives    Personally reviewed MRI of the brain July 2024 without any acute findings, mild diffuse atrophy, mild chronic microvascular disease, chronic hemorrhages in the right frontal lobe and parietal occipital lobes (new from MRI done in 2021) punctate chronic microhemorrhage in the right occipital lobe.   History on Initial Assessment 06/25/2020: This is a 78 year old left-handed woman with a history of hypertension, hyperlipidemia, depression, anxiety, presenting for evaluation of recently diagnosed dementia on Neuropsychological testing  done last 01/2020. Records reviewed. She reports her memory may not be so great. Her granddaughter Brandi Walsh Walsh provides additional information. Brandi Walsh Walsh reports an incident in December 2018 when she had visual and auditory hallucinations and was admitted to [facility] and started on psychiatric medication. She had Neuropsychological testing in April 2018 with Dr. 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 Cognitive testing indicated possible very mild global attenuation of cognitive function relative to estimated premorbid intellectual abilities, with the only area of true impairment being phonemic verbal fluency and memory retrieval afore newly learned auditory information, probable mild frontal-subcortical dysfunction. Thought process was still mildly disorganized, unclear if longstanding and/or related to primary psychiatric disorder. She was no longer having delusions at that time. Brandi Walsh Walsh reports that since then, there has been a steady decline with her memory. Psychiatric disorder had been stable for 2 years with adjustment in Lexapro and Olanzapine. Over the past 6 months, Brandi Walsh Walsh noticed a biggest turning point in her cognition. She also noticed slower movements. She is overall more anxious. Her psychiatrist has advised monitoring for tardive dyskinesia, her lower jaw movements have progressively worsened. Brandi Walsh Walsh was not sure about medication adherence, so she has been helping the last few weeks. She noted waxing and waning symptoms and requested repeat Neuropsychological testing. Records from Dr. 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 Cholinesterase inhibitor was recommended, she was started on Donepezil 5mg  daily by her Psychiatrist, no side effects.    She lives with her husband. She states her memory "may not be so great." Brandi Walsh Walsh reports that she has always been very sharp, so the slightest things she does not remember would bother her. She denies getting lost driving but drives rarely, around once a month. She and her husband do bills together, there may be some  missed bills. Her husband manages meals. She is slow to answer questions but answers appropriately and thoughtfully. She has some dizziness in the morning and endorses vertigo when lying flat. She has some blurred vision. She has had some pain between her shoulder pain for a few weeks. She reports numbness in both calves. She has had bilateral leg swelling even before Donepezil was started. She has occasional constipation. She has occasional hand tremors when anxious. No headaches, dysarthria/dysphagia, focal weakness, anosmia. Sleep is okay. 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 She reports mood is good. Brandi Walsh Walsh notes she worries/stresses too much. Everyday is a little different with how she feels, how quickly she is moving. Sometimes she is a bit more agitated and does not want to eat, saying her partials don't fit well or her things get stuck (she is s/p esophageal dilatation). Brandi Walsh Walsh notes she has always been thoughtful but not as slow. She has always been very precise and detail-oriented, which is not changed, but she is not usually as slow. No visual/auditory hallucinations. She is independent with dressing and bathing but gets stuck sometimes putting on clothes. If she gets stuck in a thought, she freezes and cannot move. No recent changes in psychiatric medication, a year ago Lexapro was increased to 10mg  to increase motivation, which has not helped much. Brandi Walsh Walsh notes there was more lucidity to her surroundings at that time, which is not present  now. No family history of dementia, no history of concussions or alcohol use.     Repeat Neuropsychological evaluation 07/2023: Major Neurocognitive Disorder with severe impairment surrounding processing speed, cognitive flexibility, confrontation naming, clock drawing, and delayed retrieval aspects of memory. Additional weaknesses were exhibited across complex attention, verbal fluency, and recognition/consolidation aspects of memory. Relative to prior  testing, despite differing instrumentation, Ms. Seefeldt has exhibited quite prominent and diffuse cognitive decline relative to her previous evaluation in 2018. While the report associated with her 2021 evaluation did not provide any test scores, the description of her functioning does seem to align with what testing captured across the current evaluation. I share Dr. McDermott's prior concerns surrounding the presence of Alzheimer's disease. Despite being able to initially learn information well, she was fully amnestic (i.e., 0% retention) across 2/3 memory tasks and only exhibited 14% retention across her remaining memory test. She also exhibited overall poor performance across yes/no recognition trials. Taken together, this suggests rapid forgetting and an evolving and already quite significant storage impairment, both of which are the hallmark testing characteristics of this illness. Further impairment/weakness in confrontation naming and semantic fluency unfortunately represent traditional disease progression. She additionally exhibited bilateral hippocampal atrophy of moderate severity on a brain MRI back in 2021, which is a well-established risk factor for this illness.   PREVIOUS MEDICATIONS: Donepezil 10 mg daily (unable to tolerate)  CURRENT MEDICATIONS:  Outpatient Encounter Medications as of 01/25/2024  Medication Sig   alendronate (FOSAMAX) 70 MG tablet Take 70 mg by mouth once a week.   Cholecalciferol (VITAMIN D) 50 MCG (2000 UT) tablet Take 2,000 Units by mouth daily.   escitalopram (LEXAPRO) 10 MG tablet Take 1.5 tablets (15 mg total) by mouth daily.   losartan (COZAAR) 50 MG tablet Take 1 tablet (50 mg total) by mouth daily.   mupirocin ointment (BACTROBAN) 2 % Apply 1 Application topically 2 (two) times daily.   pantoprazole (PROTONIX) 40 MG tablet Take 40 mg by mouth every other day.   rosuvastatin (CRESTOR) 40 MG tablet Take 1 tablet (40 mg total) by mouth daily.   [DISCONTINUED]  donepezil (ARICEPT) 5 MG tablet Take 1 tablet daily   donepezil (ARICEPT) 5 MG tablet Take 1 tablet daily   No facility-administered encounter medications on file as of 01/25/2024.       01/25/2024   12:00 PM 11/19/2022    3:32 PM 11/12/2022    9:00 AM  MMSE - Mini Mental State Exam  Orientation to time 4 5 5   Orientation to Place 5 5 5   Registration 3 3 3   Attention/ Calculation 5 5 5   Recall 2 0 0  Language- name 2 objects 2 2 2   Language- repeat 1 1 1   Language- follow 3 step command 3 3 3   Language- read & follow direction 1 1 1   Write a sentence 1 1 1   Copy design 0 0 1  Total score 27 26 27        No data to display          Objective:     PHYSICAL EXAMINATION:    VITALS:   Vitals:   01/25/24 0846 01/25/24 0933  BP: (!) 162/83 131/88  Pulse: 76   Resp: 18   SpO2: 97%   Weight: 159 lb (72.1 kg)   Height: 5' (1.524 m)     GEN:  The patient appears stated age and is in NAD. HEENT:  Normocephalic, atraumatic.   Neurological examination:  General: NAD,  well-groomed, appears stated age. Orientation: The patient is alert. Oriented to person, place and time. Cranial nerves: There is good facial symmetry.The speech is fluent and clear. No aphasia or dysarthria. Fund of knowledge is appropriate. Recent and remote memory are impaired. Attention and concentration are normal.  Able to name objects and repeat phrases.  Hearing is intact to conversational tone. Sensation: Sensation is intact to light touch throughout Motor: Strength is at least antigravity x4. DTR's 1/4 in UE/LE     Movement examination: Tone: There is normal tone in the UE/LE Abnormal movements:  no tremor.  No myoclonus.  No asterixis.   Coordination:  There is no decremation with RAM's. Normal finger to nose  Gait and Station: The patient has some difficulty arising out of a deep-seated chair without the use of the hands. The patient's stride length is short.  Gait is cautious and narrow,  slow   Thank you for allowing [place] the opportunity to participate in the care of this nice patient. Please do not hesitate to contact us for any questions or concerns.   Total time spent on today's visit was 31 minutes dedicated to this patient today, preparing to see patient, examining the patient, ordering tests and/or medications and counseling the patient, documenting clinical information in the EHR or other health record, independently interpreting results and communicating results to the patient/family, discussing treatment and goals, answering patient's questions and coordinating care.  Cc:  Elenore Paddy, NP  Marlowe Kays 01/25/2024 12:29 PM

## 2024-02-02 ENCOUNTER — Encounter: Payer: Self-pay | Admitting: Nurse Practitioner

## 2024-02-29 ENCOUNTER — Ambulatory Visit: Payer: Medicare Other | Admitting: Gastroenterology

## 2024-02-29 ENCOUNTER — Encounter: Payer: Self-pay | Admitting: Gastroenterology

## 2024-02-29 VITALS — BP 110/72 | HR 68 | Ht 60.0 in | Wt 156.0 lb

## 2024-02-29 DIAGNOSIS — K760 Fatty (change of) liver, not elsewhere classified: Secondary | ICD-10-CM

## 2024-02-29 DIAGNOSIS — K219 Gastro-esophageal reflux disease without esophagitis: Secondary | ICD-10-CM

## 2024-02-29 DIAGNOSIS — K449 Diaphragmatic hernia without obstruction or gangrene: Secondary | ICD-10-CM

## 2024-02-29 DIAGNOSIS — R7989 Other specified abnormal findings of blood chemistry: Secondary | ICD-10-CM

## 2024-02-29 MED ORDER — PANTOPRAZOLE SODIUM 20 MG PO TBEC: 20.0000 mg | DELAYED_RELEASE_TABLET | Freq: Every day | ORAL | 3 refills | Status: AC

## 2024-02-29 NOTE — Progress Notes (Signed)
 Chief Complaint: FU  Referring Provider:  Elenore Paddy, NP      ASSESSMENT AND PLAN;   #1. Abn LFTs d/t fatty liver  #2. GERD with small HH  #3. UGI bleed d/t GU (resolved, Rpt EGD with complete healing of gastric ulcers 08/09/2023)  Plan: -Decrease protonix 20mg  po every day #90, 4RF -Trend LFTs -FU PRN -D/W pt and GD   HPI:    Brandi Walsh is a 78 y.o. female  Accompanied by her granddaughter  FU  S/P EGD with polypectomy 08/09/2023. Bx-benign hyperplastic.  Also showed complete healing of gastric ulcers.  No nausea, vomiting, heartburn, regurgitation, odynophagia or dysphagia.  No significant diarrhea or constipation.  No melena or hematochezia. No unintentional weight loss. No abdominal pain.  LFTs are improving.  Previous CT showed fatty liver without cirrhosis.  Granddaughter was concerned about long-term side effects of Protonix 40 mg p.o. daily.  She is willing to try 20 mg.  From previous notes: Recent hospital admission for UGIB 6/6-05/08/23. Underwent EGD which showed 2 nonbleeding gastric ulcers and gastric polyp      Endoscopic History:  -  EGD with polypectomy: Healed gastric ulcers, S/P gastric polypectomy. Bx-hyperplastic. - EGD 05/07/23: - 2 cm hiatal hernia. - Non- bleeding gastric ulcers measuring up to 8 mm in the gastric antrum and pylorus with no stigmata of bleeding. Biopsied. - A single 10 mm semipedunculated gastric hyperplastic polyp with erythematous head. Biopsied. - Normal examined duodenum.   -EGD (06/11/2020): Mild Schatzki's ring dilated with 20 mm TTS balloon without mucosal rent.  Empirically dilated remainder of the esophagus with 20 mm TTS balloon inflated and dragged through esophagus without mucosal change.  20 mm hyperplastic polyp in gastric antrum (biopsies benign).  Benign fundic gland polyps. -Resolution of dysphagia since EGD with empiric dilation.  -Patient has since regained her appetite and back to baseline weight    -Colonoscopy in 2007      Latest Ref Rng & Units 01/21/2024    2:53 PM 12/17/2023    9:33 AM 12/10/2023    9:50 AM  Hepatic Function  Total Protein 6.0 - 8.3 g/dL 7.2  7.2  7.2   Albumin 3.5 - 5.2 g/dL 3.7  3.7  3.9   AST 0 - 37 U/L 38  80  44   ALT 0 - 35 U/L 39  92  57   Alk Phosphatase 39 - 117 U/L 80  81  90   Total Bilirubin 0.2 - 1.2 mg/dL 0.9  1.1  0.8       Past Medical History:  Diagnosis Date   Abnormal liver enzymes 09/19/2019   Blurred vision 09/19/2019   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   Conjunctivitis of both eyes 05/27/2023   Dementia likely due to Alzheimer's disease 07/06/2023   Dysuria 05/27/2023   Essential hypertension 10/13/2007   GERD (gastroesophageal reflux disease) 02/19/2012   Hypercalcemia 03/15/2019   Hyperlipidemia 11/11/2020   Localized edema 01/22/2017   Major depressive disorder 11/24/2018   Melena 05/06/2023   Menopausal syndrome 10/13/2007   Osteoporosis 10/13/2007   Polycythemia 12/03/2010   Primary hyperparathyroidism 06/12/2013   Type II diabetes mellitus 10/13/2007    Past Surgical History:  Procedure Laterality Date   ABDOMINAL HYSTERECTOMY     BIOPSY  05/08/2023   Procedure: BIOPSY;  Surgeon: Lynann Bologna, MD;  Location: [facility];  Service: Gastroenterology;;   BREAST BIOPSY  08/2002   ESOPHAGOGASTRODUODENOSCOPY N/A 05/08/2023   Procedure: ESOPHAGOGASTRODUODENOSCOPY (EGD);  Surgeon: Chales Abrahams,  Filbert Berthold, MD;  Location: [facility];  Service: Gastroenterology;  Laterality: N/A;   TONSILLECTOMY     UPPER GASTROINTESTINAL ENDOSCOPY      Family History  Problem Relation Age of Onset   Anxiety disorder Mother    Hyperparathyroidism Sister    Cancer Neg Hx    Breast cancer Neg Hx    Colon cancer Neg Hx    Esophageal cancer Neg Hx    Liver cancer Neg Hx    Rectal cancer Neg Hx     Social History   Tobacco Use   Smoking status: Never   Smokeless tobacco: Never  Vaping Use   Vaping status: Never Used  Substance Use Topics    Alcohol use: No   Drug use: No    Current Outpatient Medications  Medication Sig Dispense Refill   alendronate (FOSAMAX) 70 MG tablet Take 70 mg by mouth once a week.     Cholecalciferol (VITAMIN D) 50 MCG (2000 UT) tablet Take 2,000 Units by mouth daily.     donepezil (ARICEPT) 5 MG tablet Take 1 tablet daily 90 tablet 3   escitalopram (LEXAPRO) 10 MG tablet Take 1.5 tablets (15 mg total) by mouth daily. 135 tablet 2   losartan (COZAAR) 50 MG tablet Take 1 tablet (50 mg total) by mouth daily. 90 tablet 3   mupirocin ointment (BACTROBAN) 2 % Apply 1 Application topically 2 (two) times daily. 22 g 0   pantoprazole (PROTONIX) 40 MG tablet Take 40 mg by mouth daily.     rosuvastatin (CRESTOR) 40 MG tablet Take 1 tablet (40 mg total) by mouth daily. 90 tablet 3   No current facility-administered medications for this visit.    Allergies  Allergen Reactions   Lovastatin     REACTION: Rash    Review of Systems:  neg     Physical Exam:    BP 110/72   Pulse 68   Ht 5' (1.524 m)   Wt 156 lb (70.8 kg)   BMI 30.47 kg/m  Wt Readings from Last 3 Encounters:  02/29/24 156 lb (70.8 kg)  01/25/24 159 lb (72.1 kg)  12/17/23 155 lb (70.3 kg)   Constitutional:  Well-developed, in no acute distress. Psychiatric: Normal mood and affect. Behavior is normal. HEENT: Pupils normal.  Conjunctivae are normal. No scleral icterus. Cardiovascular: Normal rate, regular rhythm. No edema Pulmonary/chest: Effort normal and breath sounds normal. No wheezing, rales or rhonchi. Abdominal: Soft, nondistended. Nontender. Bowel sounds active throughout. There are no masses palpable. No hepatomegaly. Rectal: Deferred Neurological: Alert and oriented to person place and time. Skin: Skin is warm and dry. No rashes noted.  Data Reviewed: I have personally reviewed following labs and imaging studies  CBC:    Latest Ref Rng & Units 01/21/2024    2:53 PM 12/17/2023    9:33 AM 12/10/2023    9:50 AM  CBC   WBC 4.0 - 10.5 K/uL 8.0  7.2  7.0   Hemoglobin 12.0 - 15.0 g/dL 16.1  09.6  04.5   Hematocrit 36.0 - 46.0 % 45.5  45.4  43.9   Platelets 150.0 - 400.0 K/uL 187.0  206  198.0     CMP:    Latest Ref Rng & Units 01/21/2024    2:53 PM 12/17/2023    9:33 AM 12/10/2023    9:50 AM  CMP  Glucose 70 - 99 mg/dL 409  811  914   BUN 6 - 23 mg/dL 15  14  12  Creatinine 0.40 - 1.20 mg/dL 2.95  6.21  3.08   Sodium 135 - 145 mEq/L 139  138  140   Potassium 3.5 - 5.1 mEq/L 4.2  3.8  4.3   Chloride 96 - 112 mEq/L 105  105  106   CO2 19 - 32 mEq/L 31  24  27    Calcium 8.4 - 10.5 mg/dL 65.7  84.6  96.2   Total Protein 6.0 - 8.3 g/dL 7.2  7.2  7.2   Total Bilirubin 0.2 - 1.2 mg/dL 0.9  1.1  0.8   Alkaline Phos 39 - 117 U/L 80  81  90   AST 0 - 37 U/L 38  80  44   ALT 0 - 35 U/L 39  92  57         Edman Circle, MD 02/29/2024, 9:07 AM  Cc: Elenore Paddy, NP

## 2024-02-29 NOTE — Patient Instructions (Addendum)
 _______________________________________________________  If your blood pressure at your visit was 140/90 or greater, please contact your primary care physician to follow up on this.  _______________________________________________________  If you are age 78 or older, your body mass index should be between 23-30. Your Body mass index is 30.47 kg/m. If this is out of the aforementioned range listed, please consider follow up with your Primary Care Provider.  If you are age 82 or younger, your body mass index should be between 19-25. Your Body mass index is 30.47 kg/m. If this is out of the aformentioned range listed, please consider follow up with your Primary Care Provider.   ________________________________________________________  The [facility] providers would like to encourage you to use [facility] to communicate with providers for non-urgent requests or questions.  Due to long hold times on the telephone, sending your provider a message by [facility] may be a faster and more efficient way to get a response.  Please allow 48 business hours for a response.  Please remember that this is for non-urgent requests.  _______________________________________________________  We have sent the following medications to your pharmacy for you to pick up at your convenience: Protonix 20mg  daily  Follow up as needed. Call with any questions or concerns.  Thank you,  Dr. Lynann Bologna

## 2024-04-04 ENCOUNTER — Encounter: Payer: Self-pay | Admitting: Nurse Practitioner

## 2024-04-07 ENCOUNTER — Ambulatory Visit: Payer: Medicare Other | Admitting: Nurse Practitioner

## 2024-04-07 VITALS — BP 122/78 | HR 76 | Temp 97.8°F | Ht 60.0 in | Wt 156.5 lb

## 2024-04-07 DIAGNOSIS — E785 Hyperlipidemia, unspecified: Secondary | ICD-10-CM

## 2024-04-07 DIAGNOSIS — F3342 Major depressive disorder, recurrent, in full remission: Secondary | ICD-10-CM

## 2024-04-07 DIAGNOSIS — F419 Anxiety disorder, unspecified: Secondary | ICD-10-CM | POA: Diagnosis not present

## 2024-04-07 DIAGNOSIS — I10 Essential (primary) hypertension: Secondary | ICD-10-CM

## 2024-04-07 DIAGNOSIS — R531 Weakness: Secondary | ICD-10-CM | POA: Insufficient documentation

## 2024-04-07 MED ORDER — ESCITALOPRAM OXALATE 10 MG PO TABS: 15.0000 mg | ORAL_TABLET | Freq: Every day | ORAL | 3 refills | Status: AC

## 2024-04-07 NOTE — Assessment & Plan Note (Signed)
 Chronic, stable Lexapro  refill sent to pharmacy today.

## 2024-04-07 NOTE — Progress Notes (Signed)
 Established Patient Office Visit  Subjective   Patient ID: Brandi Walsh, female    DOB: 1946-06-26  Age: 78 y.o. MRN: [id number]  Chief Complaint  Patient presents with   weakness    Patient arrives today accompanied by her granddaughter and husband.  The main concern today is patient has been having more difficulty getting out of bed and seems a bit more stiff than normal. She has history of depression, diabetes, GERD, osteoporosis, hyperlipidemia, hypertension, dementia, CKD stage II, hyperparathyroidism.  She currently follows with nephrology (for CKD), endocrinology (for hyperparathyroidism and osteoporosis), neurology (for dementia), gastroenterology (for GERD).  The main concern is that the patient is having more difficulty getting out of bed in the morning due to feeling stiff and possible deconditioning.  The granddaughter reports it takes 2 people to help her get out of bed.  Patient will often be up from 8 AM to 8 PM and does well as the day goes on.  No recent falls.  They are wondering if she would benefit for having a guardrail for standard bed just for safety and fall prevention they also wonder if there is an assist device that could help her with getting out of bed. She has a cane at home, but does not like to use it.   As for chronic medical conditions she continues on losartan  for management of hypertension, Lexapro  for management of depression, and rosuvastatin  for management of hyperlipidemia.  Patient and family report mood appears to be stable, patient denies any suicidal ideation.  She does need refill on Lexapro .  She is tolerating her rosuvastatin  and losartan  well, last LDL was 46.    ROS: see HPI    Objective:     BP 122/78   Pulse 76   Temp 97.8 F (36.6 C) (Temporal)   Ht 5' (1.524 m)   Wt 156 lb 8 oz (71 kg)   SpO2 96%   BMI 30.56 kg/m    Physical Exam Vitals reviewed.  Constitutional:      General: She is not in acute distress.     Appearance: Normal appearance.  HENT:     Head: Normocephalic and atraumatic.  Neck:     Vascular: No carotid bruit.  Cardiovascular:     Rate and Rhythm: Normal rate and regular rhythm.     Pulses: Normal pulses.     Heart sounds: Normal heart sounds.  Pulmonary:     Effort: Pulmonary effort is normal.     Breath sounds: Normal breath sounds.  Skin:    General: Skin is warm and dry.  Neurological:     General: No focal deficit present.     Mental Status: She is alert and oriented to person, place, and time.     Cranial Nerves: Cranial nerves 2-12 are intact.     Sensory: Sensation is intact.     Motor: No weakness or tremor.     Gait: Gait abnormal (shuffling).  Psychiatric:        Mood and Affect: Mood normal.        Behavior: Behavior normal.        Judgment: Judgment normal.      No results found for any visits on 04/07/24.    The ASCVD Risk score (Arnett DK, et al., 2019) failed to calculate for the following reasons:   The valid total cholesterol range is 130 to 320 mg/dL    Assessment & Plan:   Problem List Items Addressed This Visit  Cardiovascular and Mediastinum   Essential hypertension - Primary   Chronic, stable, at goal Continue losartan  50 mg daily        Other   Major depressive disorder   Chronic, stable Lexapro  refill sent to pharmacy today.      Relevant Medications   escitalopram  (LEXAPRO ) 10 MG tablet   Hyperlipidemia   Chronic, stable, at goal Continue rosuvastatin  40 mg daily      Anxiety disorder   Chronic, stable Refill Lexapro  sent to pharmacy today.      Relevant Medications   escitalopram  (LEXAPRO ) 10 MG tablet   Weakness   Acute on chronic Per family patient is exhibiting more stiffness at home, gradual increase not acutely worsened.  Has not had any falls. In office patient does exhibit a shuffling gait, strength is 5/5 and equal in bilateral upper and lower extremities as well.  I will order home health OT and  PT for assistance with evaluating and managing.  She likely would benefit from using her cane and possibly a walker.  I do think guardrail for bed safety is a good idea.   I will also reach out to neurologist to see if they can evaluate her stiffness further especially considering patient also had shuffling gait on exam today.  Question if possibly she is exhibiting early signs of Parkinson's?  No tremor identified.      Relevant Orders   Ambulatory referral to Home Health    Return in about 6 months (around 10/08/2024) for F/U with Ajax Schroll.  Total time spent in appoint was 33 minutes including face-to-face evaluation, review of previous medical records, and development/discussion of treatment plan.   Zorita Hiss, NP

## 2024-04-07 NOTE — Assessment & Plan Note (Signed)
 Chronic, stable, at goal Continue rosuvastatin  40 mg daily

## 2024-04-07 NOTE — Assessment & Plan Note (Signed)
 Acute on chronic Per family patient is exhibiting more stiffness at home, gradual increase not acutely worsened.  Has not had any falls. In office patient does exhibit a shuffling gait, strength is 5/5 and equal in bilateral upper and lower extremities as well.  I will order home health OT and PT for assistance with evaluating and managing.  She likely would benefit from using her cane and possibly a walker.  I do think guardrail for bed safety is a good idea.   I will also reach out to neurologist to see if they can evaluate her stiffness further especially considering patient also had shuffling gait on exam today.  Question if possibly she is exhibiting early signs of Parkinson's?  No tremor identified.

## 2024-04-07 NOTE — Assessment & Plan Note (Signed)
 Chronic, stable, at goal Continue losartan  50 mg daily

## 2024-04-07 NOTE — Assessment & Plan Note (Signed)
 Chronic, stable Refill Lexapro  sent to pharmacy today.

## 2024-07-04 ENCOUNTER — Emergency Department (HOSPITAL_COMMUNITY)

## 2024-07-04 ENCOUNTER — Emergency Department (HOSPITAL_COMMUNITY)
Admission: EM | Admit: 2024-07-04 | Discharge: 2024-07-04 | Disposition: A | Discharge status: Home / Self Care | Attending: Emergency Medicine | Admitting: Emergency Medicine

## 2024-07-04 DIAGNOSIS — M899 Disorder of bone, unspecified: Secondary | ICD-10-CM | POA: Insufficient documentation

## 2024-07-04 DIAGNOSIS — Z79899 Other long term (current) drug therapy: Secondary | ICD-10-CM | POA: Insufficient documentation

## 2024-07-04 DIAGNOSIS — Y92009 Unspecified place in unspecified non-institutional (private) residence as the place of occurrence of the external cause: Secondary | ICD-10-CM | POA: Diagnosis not present

## 2024-07-04 DIAGNOSIS — S0990XA Unspecified injury of head, initial encounter: Secondary | ICD-10-CM | POA: Diagnosis present

## 2024-07-04 DIAGNOSIS — W01198A Fall on same level from slipping, tripping and stumbling with subsequent striking against other object, initial encounter: Secondary | ICD-10-CM | POA: Diagnosis not present

## 2024-07-04 DIAGNOSIS — S0181XA Laceration without foreign body of other part of head, initial encounter: Secondary | ICD-10-CM | POA: Insufficient documentation

## 2024-07-04 MED ORDER — ACETAMINOPHEN 325 MG PO TABS
650.0000 mg | ORAL_TABLET | Freq: Once | ORAL | Status: AC
  Administered 2024-07-04: 650 mg via ORAL
  Filled 2024-07-04: qty 2

## 2024-07-04 MED ORDER — LIDOCAINE HCL (PF) 1 % IJ SOLN
10.0000 mL | Freq: Once | INTRAMUSCULAR | Status: AC
  Administered 2024-07-04: 10 mL
  Filled 2024-07-04: qty 30

## 2024-07-04 MED ORDER — LIDOCAINE-EPINEPHRINE-TETRACAINE (LET) TOPICAL GEL
3.0000 mL | Freq: Once | TOPICAL | Status: AC
  Administered 2024-07-04: 3 mL via TOPICAL
  Filled 2024-07-04: qty 3

## 2024-07-04 NOTE — Discharge Instructions (Addendum)
 Your CT scan showed you have some lucencies or abnormal spots on the bones in your cervical spine.  You should follow up with your primary care clinic for this issue.  This may be related to osteopenia, multiple myeloma, cancer, or other issues.   Your stitches will dissolve on their own over 1-2 weeks.  The glue will also dissolve on its own.  You should keep the glue dry for 48 hours, and then you can begin showering normally.

## 2024-07-04 NOTE — ED Triage Notes (Signed)
 Arrived POV from home w/ husband and granddaughter. Granddaughter reports patient loss balance when her husband was trying to get her up to get her cleaned up. Patient states she her her head and fell on right hip. Patient reports pain 3/10 on right forehead, denies loss of consciousness. Patient's granddaughter states that her grandmother didn't really want to walk, so she brought the wheelchair from home; she is unsure if it is from pain or psychological. Patient A&O X4, bleeding from heal lac controlled.

## 2024-07-04 NOTE — ED Provider Notes (Signed)
 LACERATION REPAIR Performed by: Margit DELENA [name] Authorized by: Margit DELENA [name] Consent: Verbal consent obtained. Risks and benefits: risks, benefits and alternatives were discussed Consent given by: patient Patient identity confirmed: provided demographic data Prepped and Draped in normal sterile fashion Wound explored  Laceration Location: right forehead above eyebrow  Laceration Length: 3cm  No Foreign Bodies seen or palpated  Anesthesia: topical  Local anesthetic:L.E.T.  Anesthetic total: 3 ml  Irrigation method: syringe Amount of cleaning: standard  Skin closure: 5-0 absorbable for 2 SQ sutures, dermis closed with Dermabond  Number of sutures: 2 SQ  Technique: inverted  Patient tolerance: Patient tolerated the procedure well with no immediate complications.    [name] Margit, PA-C 07/04/24 1656    Cottie Donnice PARAS, MD 07/04/24 [phone]

## 2024-07-04 NOTE — ED Provider Triage Note (Signed)
 Emergency Medicine Provider Triage Evaluation Note  Brandi Walsh , a 78 y.o. female  was evaluated in triage.  Pt complains of fall. Patient reportedly took a fall this morning after her husband was trying to help get her out of bed. Denies any area of significant pain but a right eye laceration was noted.  Review of Systems  Positive: As above Negative: As above  Physical Exam  BP (!) 148/79   Pulse 66   Temp 97.9 F (36.6 C)   Resp 15   Ht 5' (1.524 m)   Wt 70.3 kg   SpO2 100%   BMI 30.27 kg/m  Gen:   Awake, no distress   Resp:  Normal effort  MSK:   Moves extremities without difficulty  Other:  3cm right eyebrow laceration. Minimal bleeding.  Medical Decision Making  Medically screening exam initiated at 11:38 AM.  Appropriate orders placed.  Brandi Walsh was informed that the remainder of the evaluation will be completed by another provider, this initial triage assessment does not replace that evaluation, and the importance of remaining in the ED until their evaluation is complete.  Based on mechanism and eyebrow laceration, will proceed with CT head and neck. She is not on blood thinners.   Brandi Walsh A, PA-C 07/04/24 1139

## 2024-07-04 NOTE — ED Provider Notes (Signed)
 [facility] AT [facility] Provider Note   CSN: [id number] Arrival date & time: 07/04/24  1054     Patient presents with: Fall and Laceration (Right forehead )   Brandi Walsh is a 78 y.o. female here with a mechanical fall at home, tripping and struck forehead.  No LOC.  Not on A/C.  Denies hip pain, neck pain. Reports mild headache and cut on forehead. Lives with husband, and typically walks with a cane or assistance.    HPI     Prior to Admission medications   Medication Sig Start Date End Date Taking? Authorizing Provider  alendronate (FOSAMAX) 70 MG tablet Take 70 mg by mouth once a week. 03/31/21   [provider]  Cholecalciferol (VITAMIN D ) 50 MCG (2000 UT) tablet Take 2,000 Units by mouth daily.    [provider]  donepezil  (ARICEPT ) 5 MG tablet Take 1 tablet daily 01/25/24   Wertman, Sara E, PA-C  escitalopram  (LEXAPRO ) 10 MG tablet Take 1.5 tablets (15 mg total) by mouth daily. 04/07/24   Elnor Lauraine BRAVO, NP  losartan  (COZAAR ) 50 MG tablet Take 1 tablet (50 mg total) by mouth daily. 12/20/23   Elnor Lauraine BRAVO, NP  mupirocin  ointment (BACTROBAN ) 2 % Apply 1 Application topically 2 (two) times daily. 12/10/23   Elnor Lauraine BRAVO, NP  pantoprazole  (PROTONIX ) 20 MG tablet Take 1 tablet (20 mg total) by mouth daily. 02/29/24   Charlanne Groom, MD  rosuvastatin  (CRESTOR ) 40 MG tablet Take 1 tablet (40 mg total) by mouth daily. 11/22/23   Elnor Lauraine BRAVO, NP    Allergies: Lovastatin    Review of Systems  Updated Vital Signs BP 131/76 (BP Location: Right Arm)   Pulse 77   Temp 98.9 F (37.2 C) (Oral)   Resp 16   Ht 5' (1.524 m)   Wt 70.3 kg   SpO2 99%   BMI 30.27 kg/m   Physical Exam Constitutional:      General: She is not in acute distress. HENT:     Head: Normocephalic.      Comments: 3 cm linear laceration lateral to right eyebrow, small ecchymoses near left eye Eyes:     Conjunctiva/sclera: Conjunctivae normal.     Pupils:  Pupils are equal, round, and reactive to light.  Cardiovascular:     Rate and Rhythm: Normal rate and regular rhythm.  Pulmonary:     Effort: Pulmonary effort is normal. No respiratory distress.  Musculoskeletal:     Comments: Full ROM of the hip and extremities with no discomfort  Skin:    General: Skin is warm and dry.  Neurological:     General: No focal deficit present.     Mental Status: She is alert. Mental status is at baseline.  Psychiatric:        Mood and Affect: Mood normal.        Behavior: Behavior normal.     (all labs ordered are listed, but only abnormal results are displayed) Labs Reviewed - No data to display  EKG: None  Radiology: CT Cervical Spine Wo Contrast Result Date: 07/04/2024 EXAM: CT CERVICAL SPINE WITHOUT CONTRAST 07/04/2024 12:13:56 PM TECHNIQUE: CT of the cervical spine was performed without the administration of intravenous contrast. Multiplanar reformatted images are provided for review. Automated exposure control, iterative reconstruction, and/or weight based adjustment of the mA/kV was utilized to reduce the radiation dose to as low as reasonably achievable. COMPARISON: None available. CLINICAL HISTORY: Neck trauma (Age >=  65y). Trauma; from home w/ husband and granddaughter. Granddaughter reports patient loss balance when her husband was trying to get her up to get her cleaned up. Patient states she her her head and fell on right hip. Patient reports pain 3/10 on right forehead, ; denies loss of consciousness. Patient's granddaughter states that her grandmother didn't really want to walk, so she brought the wheelchair from home; she is unsure if it is from pain or psychological. Patient A\T\O X4, bleeding from heal lac controlled. FINDINGS: CERVICAL SPINE: BONES AND ALIGNMENT: No compression fracture or displaced fracture in the cervical spine. DEGENERATIVE CHANGES: Multiple lucent foci throughout the spine indicative of osteophytes at multiple levels.  There is intervertebral disc space narrowing at multiple levels. Disc osteophyte complexes at multiple levels throughout the cervical spine without high-grade osseous spinal canal stenosis. Facet arthrosis and osteotrophy at multiple levels. SOFT TISSUES: The paraspinal musculature is unremarkable. No significant prevertebral edema. There is elongation of the left styloid process and prominent calcification of the stylohyoid ligament on the left. IMPRESSION: 1. No acute fracture or traumatic malalignment of the cervical spine. 2. Multiple lucent foci within the cervical spine which may be related to osteopenia versus myeloma or neoplasm. Recommend nonemergent MRI cervical spine with and without contrast versus nuclear medicine bone scan. 3. Multilevel degenerative changes in the cervical spine. Electronically signed by: Donnice Mania MD 07/04/2024 01:11 PM EDT RP Workstation: [id number]   CT Head Wo Contrast Result Date: 07/04/2024 EXAM: CT HEAD WITHOUT CONTRAST 07/04/2024 12:13:56 PM TECHNIQUE: CT of the head was performed without the administration of intravenous contrast. Automated exposure control, iterative reconstruction, and/or weight based adjustment of the mA/kV was utilized to reduce the radiation dose to as low as reasonably achievable. COMPARISON: MRI head dated 05/29/2023. CLINICAL HISTORY: Head trauma, minor (Age >= 65y). Trauma; from home w/ husband and granddaughter. Granddaughter reports patient loss balance when her husband was trying to get her up to get her cleaned up. Patient states she her her head and fell on right hip. Patient reports pain 3/10 on right forehead, denies loss of consciousness. FINDINGS: BRAIN AND VENTRICLES: No acute hemorrhage. Gray-white differentiation is preserved. No hydrocephalus. No extra-axial collection. No mass effect or midline shift. Nonspecific hypoattenuation in the periventricular and subcortical white matter, most likely representing chronic small vessel  disease. ORBITS: No acute abnormality. SINUSES: No acute abnormality. SOFT TISSUES AND SKULL: No acute soft tissue abnormality. No acute or aggressive skull fracture. Multiple small lucent foci throughout the calvarium. Atherosclerotic calcifications at the skull base involving the carotid siphons. IMPRESSION: 1. No acute intracranial abnormality related to the reported head trauma. 2. Chronic small vessel disease. 3. Multiple small lucencies in the calvarium which could be related to osteopenia versus myeloma or neoplastic process. Consider correlation with non-emergent MRI head with and without contrast versus nuclear medicine bone scan. Electronically signed by: Donnice Mania MD 07/04/2024 12:46 PM EDT RP Workstation: [id number]     Procedures   Medications Ordered in the ED  lidocaine -EPINEPHrine -tetracaine  (LET) topical gel (3 mLs Topical Given 07/04/24 1611)  lidocaine  (PF) (XYLOCAINE ) 1 % injection 10 mL (10 mLs Infiltration Given 07/04/24 1611)  acetaminophen  (TYLENOL ) tablet 650 mg (650 mg Oral Given 07/04/24 1611)                                    Medical Decision Making Risk OTC drugs. Prescription drug management.   Mechanical fall and  isolated head injury Supplemental history provided by husband and granddaughter at bedside  Wound cleaned, PA provider assisted with wound closure  Tetanus up to date NO evidence of infection  CT imaging personally reviewed, no emergencies but I did discuss incidental bony lucencies with pt and family, whose ddx includes osteopenia, myeloma, or malignancy.  Will need PCP follow up for this.  Tylenol  as needed for mild headache  No further imaging indicated at this time; doubt pelvic/hip fx or internal bleeding or injury.  Okay for discharge.     Final diagnoses:  Facial laceration, initial encounter  Injury of head, initial encounter  Bone lesion    ED Discharge Orders     None          Sharise Lippy, Donnice PARAS, MD 07/04/24  1757

## 2024-07-05 ENCOUNTER — Encounter: Payer: Self-pay | Admitting: Nurse Practitioner

## 2024-07-05 DIAGNOSIS — R531 Weakness: Secondary | ICD-10-CM

## 2024-07-05 DIAGNOSIS — F0280 Dementia in other diseases classified elsewhere without behavioral disturbance: Secondary | ICD-10-CM

## 2024-07-19 ENCOUNTER — Encounter: Payer: Self-pay | Admitting: Neurology

## 2024-07-24 ENCOUNTER — Ambulatory Visit: Payer: Medicare Other | Admitting: Neurology

## 2024-07-25 ENCOUNTER — Encounter: Payer: Self-pay | Admitting: Internal Medicine

## 2024-07-25 ENCOUNTER — Ambulatory Visit: Admitting: Internal Medicine

## 2024-07-25 ENCOUNTER — Ambulatory Visit

## 2024-07-25 ENCOUNTER — Ambulatory Visit: Payer: Self-pay | Admitting: Internal Medicine

## 2024-07-25 VITALS — BP 122/74 | HR 74 | Temp 97.6°F | Ht 60.0 in

## 2024-07-25 DIAGNOSIS — R937 Abnormal findings on diagnostic imaging of other parts of musculoskeletal system: Secondary | ICD-10-CM

## 2024-07-25 DIAGNOSIS — R296 Repeated falls: Secondary | ICD-10-CM

## 2024-07-25 DIAGNOSIS — E119 Type 2 diabetes mellitus without complications: Secondary | ICD-10-CM

## 2024-07-25 DIAGNOSIS — R930 Abnormal findings on diagnostic imaging of skull and head, not elsewhere classified: Secondary | ICD-10-CM

## 2024-07-25 DIAGNOSIS — I10 Essential (primary) hypertension: Secondary | ICD-10-CM

## 2024-07-25 DIAGNOSIS — Z7984 Long term (current) use of oral hypoglycemic drugs: Secondary | ICD-10-CM

## 2024-07-25 DIAGNOSIS — F0280 Dementia in other diseases classified elsewhere without behavioral disturbance: Secondary | ICD-10-CM | POA: Diagnosis not present

## 2024-07-25 DIAGNOSIS — G309 Alzheimer's disease, unspecified: Secondary | ICD-10-CM | POA: Diagnosis not present

## 2024-07-25 DIAGNOSIS — E210 Primary hyperparathyroidism: Secondary | ICD-10-CM

## 2024-07-25 LAB — CBC WITH DIFFERENTIAL/PLATELET
Basophils Absolute: 0 K/uL (ref 0.0–0.1)
Basophils Relative: 0.6 % (ref 0.0–3.0)
Eosinophils Absolute: 0.1 K/uL (ref 0.0–0.7)
Eosinophils Relative: 1.6 % (ref 0.0–5.0)
HCT: 44.8 % (ref 36.0–46.0)
Hemoglobin: 15.4 g/dL — ABNORMAL HIGH (ref 12.0–15.0)
Lymphocytes Relative: 24.5 % (ref 12.0–46.0)
Lymphs Abs: 1.8 K/uL (ref 0.7–4.0)
MCHC: 34.3 g/dL (ref 30.0–36.0)
MCV: 89.9 fl (ref 78.0–100.0)
Monocytes Absolute: 0.5 K/uL (ref 0.1–1.0)
Monocytes Relative: 6.4 % (ref 3.0–12.0)
Neutro Abs: 4.8 K/uL (ref 1.4–7.7)
Neutrophils Relative %: 66.9 % (ref 43.0–77.0)
Platelets: 191 K/uL (ref 150.0–400.0)
RBC: 4.99 Mil/uL (ref 3.87–5.11)
RDW: 13.3 % (ref 11.5–15.5)
WBC: 7.2 K/uL (ref 4.0–10.5)

## 2024-07-25 LAB — LIPID PANEL
Cholesterol: 114 mg/dL (ref 0–200)
HDL: 26.5 mg/dL — ABNORMAL LOW (ref >39.00)
LDL Cholesterol: 50 mg/dL (ref 0–99)
NonHDL: 87.02
Total CHOL/HDL Ratio: 4
Triglycerides: 183 mg/dL — ABNORMAL HIGH (ref 0.0–149.0)
VLDL: 36.6 mg/dL (ref 0.0–40.0)

## 2024-07-25 LAB — HEMOGLOBIN A1C: Hgb A1c MFr Bld: 8.6 % — ABNORMAL HIGH (ref 4.6–6.5)

## 2024-07-25 LAB — HEPATIC FUNCTION PANEL
ALT: 29 U/L (ref 0–35)
AST: 29 U/L (ref 0–37)
Albumin: 3.7 g/dL (ref 3.5–5.2)
Alkaline Phosphatase: 80 U/L (ref 39–117)
Bilirubin, Direct: 0.2 mg/dL (ref 0.0–0.3)
Total Bilirubin: 1.2 mg/dL (ref 0.2–1.2)
Total Protein: 7.3 g/dL (ref 6.0–8.3)

## 2024-07-25 LAB — BASIC METABOLIC PANEL WITH GFR
BUN: 15 mg/dL (ref 6–23)
CO2: 27 meq/L (ref 19–32)
Calcium: 10.5 mg/dL (ref 8.4–10.5)
Chloride: 102 meq/L (ref 96–112)
Creatinine, Ser: 0.85 mg/dL (ref 0.40–1.20)
GFR: 65.55 mL/min (ref >60.00)
Glucose, Bld: 213 mg/dL — ABNORMAL HIGH (ref 70–99)
Potassium: 4.2 meq/L (ref 3.5–5.1)
Sodium: 139 meq/L (ref 135–145)

## 2024-07-25 LAB — TSH: TSH: 0.5 u[IU]/mL (ref 0.35–5.50)

## 2024-07-25 LAB — VITAMIN B12: Vitamin B-12: 673 pg/mL (ref 211–911)

## 2024-07-25 MED ORDER — METFORMIN HCL ER 500 MG PO TB24: 500.0000 mg | ORAL_TABLET | Freq: Every day | ORAL | 3 refills | Status: DC

## 2024-07-25 NOTE — Assessment & Plan Note (Signed)
 Also for f/u PTH level and calcium 

## 2024-07-25 NOTE — Assessment & Plan Note (Signed)
 Pt will need HH with RN, PT OT and aide

## 2024-07-25 NOTE — Progress Notes (Signed)
 Patient ID: Brandi Walsh, female   DOB: 06-10-1946, 78 y.o.   MRN: [id number]        Chief Complaint: follow up ED visit Jul 05, 2023 after fall with abnormal CT C spine and Head,, recurrent falls/ gait disorder, dementia, hyperparathyroidism, htn, dm       HPI:  Brandi Walsh is a 78 y.o. female here with granddaughter after pt fall and ED visit aug 5 with CT cervical and Head abnormalities;  CT cervical spine: MPRESSION: 1. No acute fracture or traumatic malalignment of the cervical spine. 2. Multiple lucent foci within the cervical spine which may be related to osteopenia versus myeloma or neoplasm. Recommend nonemergent MRI cervical spine with and without contrast versus nuclear medicine bone scan. 3. Multilevel degenerative changes in the cervical spine.   CT Head - MPRESSION: 1. No acute intracranial abnormality related to the reported head trauma. 2. Chronic small vessel disease. 3. Multiple small lucencies in the calvarium which could be related to osteopenia versus myeloma or neoplastic process. Consider correlation with non-emergent MRI head with and without contrast versus nuclear medicine bone scan.   Pt unable to give hx.  Granddaughter states pt has been getting weaker, sleeping more, fatigued, higher risk of fall, wondering about referral to hospice.   Asking for at least [facility] to see with PT.  No known prior hx of known malignancy.   Wt Readings from Last 3 Encounters:  07/04/24 155 lb (70.3 kg)  04/07/24 156 lb 8 oz (71 kg)  02/29/24 156 lb (70.8 kg)   BP Readings from Last 3 Encounters:  07/25/24 122/74  07/04/24 131/76  04/07/24 122/78         Past Medical History:  Diagnosis Date   Abnormal liver enzymes 09/19/2019   Blurred vision 09/19/2019   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   Conjunctivitis of both eyes 05/27/2023   Dementia likely due to Alzheimer's disease 07/06/2023   Dysuria 05/27/2023   Essential hypertension 10/13/2007   GERD (gastroesophageal reflux disease)  02/19/2012   Hypercalcemia 03/15/2019   Hyperlipidemia 11/11/2020   Localized edema 01/22/2017   Major depressive disorder 11/24/2018   Melena 05/06/2023   Menopausal syndrome 10/13/2007   Osteoporosis 10/13/2007   Polycythemia 12/03/2010   Primary hyperparathyroidism 06/12/2013   Type II diabetes mellitus 10/13/2007   Past Surgical History:  Procedure Laterality Date   ABDOMINAL HYSTERECTOMY     BIOPSY  05/08/2023   Procedure: BIOPSY;  Surgeon: Charlanne Groom, MD;  Location: [facility];  Service: Gastroenterology;;   BREAST BIOPSY  08/2002   ESOPHAGOGASTRODUODENOSCOPY N/A 05/08/2023   Procedure: ESOPHAGOGASTRODUODENOSCOPY (EGD);  Surgeon: Charlanne Groom, MD;  Location: [facility];  Service: Gastroenterology;  Laterality: N/A;   TONSILLECTOMY     UPPER GASTROINTESTINAL ENDOSCOPY      reports that she has never smoked. She has never used smokeless tobacco. She reports that she does not drink alcohol and does not use drugs. family history includes Anxiety disorder in her mother; Hyperparathyroidism in her sister. Allergies  Allergen Reactions   Lovastatin     REACTION: Rash   Current Outpatient Medications on File Prior to Visit  Medication Sig Dispense Refill   alendronate (FOSAMAX) 70 MG tablet Take 70 mg by mouth once a week.     Cholecalciferol (VITAMIN D ) 50 MCG (2000 UT) tablet Take 2,000 Units by mouth daily.     donepezil  (ARICEPT ) 5 MG tablet Take 1 tablet daily 90 tablet 3   escitalopram  (LEXAPRO ) 10 MG tablet Take 1.5 tablets (  15 mg total) by mouth daily. 135 tablet 3   losartan  (COZAAR ) 50 MG tablet Take 1 tablet (50 mg total) by mouth daily. 90 tablet 3   mupirocin  ointment (BACTROBAN ) 2 % Apply 1 Application topically 2 (two) times daily. 22 g 0   pantoprazole  (PROTONIX ) 20 MG tablet Take 1 tablet (20 mg total) by mouth daily. 90 tablet 3   rosuvastatin  (CRESTOR ) 40 MG tablet Take 1 tablet (40 mg total) by mouth daily. 90 tablet 3   No current  facility-administered medications on file prior to visit.        ROS:  All others reviewed and negative.  Objective        PE:  BP 122/74   Pulse 74   Temp 97.6 F (36.4 C) (Temporal)   Ht 5' (1.524 m)   SpO2 95%   BMI 30.27 kg/m                 Constitutional: Pt appears in NAD               HENT: Head: NCAT.                Right Ear: External ear normal.                 Left Ear: External ear normal.                Eyes: . Pupils are equal, round, and reactive to light. Conjunctivae and EOM are normal               Nose: without d/c or deformity               Neck: Neck supple. Gross normal ROM               Cardiovascular: Normal rate and regular rhythm.                 Pulmonary/Chest: Effort normal and breath sounds without rales or wheezing.                Abd:  Soft, NT, ND, + BS, no organomegaly               Neurological: Pt is alert. At baseline orientation, motor grossly intact               Skin: Skin is warm. No rashes, no other new lesions, LE edema - none               Psychiatric: Pt behavior is normal without agitation   Micro: none  Cardiac tracings I have personally interpreted today:  none  Pertinent Radiological findings (summarize): none   Lab Results  Component Value Date   WBC 7.2 07/25/2024   HGB 15.4 (H) 07/25/2024   HCT 44.8 07/25/2024   PLT 191.0 07/25/2024   GLUCOSE 213 (H) 07/25/2024   CHOL 114 07/25/2024   TRIG 183.0 (H) 07/25/2024   HDL 26.50 (L) 07/25/2024   LDLDIRECT 61.8 06/12/2013   LDLCALC 50 07/25/2024   ALT 29 07/25/2024   AST 29 07/25/2024   NA 139 07/25/2024   K 4.2 07/25/2024   CL 102 07/25/2024   CREATININE 0.85 07/25/2024   BUN 15 07/25/2024   CO2 27 07/25/2024   TSH 0.50 07/25/2024   INR 1.2 (H) 01/21/2024   HGBA1C 8.6 (H) 07/25/2024   MICROALBUR 3.0 (H) 12/03/2010   Assessment/Plan:  Brandi Walsh is a 78 y.o. Black or African  American [2] female with  has a past medical history of Abnormal liver enzymes  (09/19/2019), Blurred vision (09/19/2019), Coccydynia (09/19/2019), Conjunctivitis of both eyes (05/27/2023), Dementia likely due to Alzheimer's disease (07/06/2023), Dysuria (05/27/2023), Essential hypertension (10/13/2007), GERD (gastroesophageal reflux disease) (02/19/2012), Hypercalcemia (03/15/2019), Hyperlipidemia (11/11/2020), Localized edema (01/22/2017), Major depressive disorder (11/24/2018), Melena (05/06/2023), Menopausal syndrome (10/13/2007), Osteoporosis (10/13/2007), Polycythemia (12/03/2010), Primary hyperparathyroidism (06/12/2013), and Type II diabetes mellitus (10/13/2007).  Abnormal CT scan, cervical spine With lucensies of unclear etiology, for C spine MRI, consider oncology referral if abnormal; also for cxr, SPEP, immunoglobulins quant  Abnormal head CT Also with lucensies of unclear etiology, for Brain MRI  Dementia likely due to Alzheimer's disease Chronic stable, continue aricept  5 mg  Primary hyperparathyroidism Also for f/u PTH level and calcium   Recurrent falls Pt will need HH with RN, PT OT and aide  Essential hypertension BP Readings from Last 3 Encounters:  07/25/24 122/74  07/04/24 131/76  04/07/24 122/78   Stable, pt to continue medical treatment losartan  50 mg qd   Type II diabetes mellitus Lab Results  Component Value Date   HGBA1C 8.6 (H) 07/25/2024   Uncontrolled, pt to start metformin  ER 500 mg qd  Followup: Return in 4 weeks (on 08/22/2024), or if symptoms worsen or fail to improve, for to PCP.  Lynwood Rush, MD 07/25/2024 8:30 PM [facility]

## 2024-07-25 NOTE — Assessment & Plan Note (Addendum)
 With lucensies of unclear etiology, for C spine MRI, consider oncology referral if abnormal; also for cxr, SPEP, immunoglobulins quant

## 2024-07-25 NOTE — Assessment & Plan Note (Signed)
 BP Readings from Last 3 Encounters:  07/25/24 122/74  07/04/24 131/76  04/07/24 122/78   Stable, pt to continue medical treatment losartan  50 mg qd

## 2024-07-25 NOTE — Assessment & Plan Note (Signed)
 Lab Results  Component Value Date   HGBA1C 8.6 (H) 07/25/2024   Uncontrolled, pt to start metformin  ER 500 mg qd

## 2024-07-25 NOTE — Assessment & Plan Note (Signed)
 Chronic stable, continue aricept  5 mg

## 2024-07-25 NOTE — Patient Instructions (Signed)
 Please continue all other medications as before  Please have the pharmacy call with any other refills you may need.  Please keep your appointments with your specialists as you may have planned  You will be contacted regarding the referral for: Home Health with RN, PT, OT, and aide  You will be contacted regarding the referral for: MRI brain, and MRI cervical spine  Please go to the XRAY Department in the first floor for the x-ray testing  Please go to the LAB at the blood drawing area for the tests to be done  You will be contacted by phone if any changes need to be made immediately.  Otherwise, you will receive a letter about your results with an explanation, but please check with MyChart first.  Please plan to also see your PCP in 1 month, or sooner if needed

## 2024-07-25 NOTE — Assessment & Plan Note (Signed)
 Also with lucensies of unclear etiology, for Brain MRI

## 2024-07-26 ENCOUNTER — Other Ambulatory Visit: Payer: Self-pay | Admitting: Nurse Practitioner

## 2024-07-26 DIAGNOSIS — R930 Abnormal findings on diagnostic imaging of skull and head, not elsewhere classified: Secondary | ICD-10-CM

## 2024-07-26 DIAGNOSIS — R296 Repeated falls: Secondary | ICD-10-CM

## 2024-07-26 DIAGNOSIS — E210 Primary hyperparathyroidism: Secondary | ICD-10-CM

## 2024-07-26 DIAGNOSIS — R937 Abnormal findings on diagnostic imaging of other parts of musculoskeletal system: Secondary | ICD-10-CM

## 2024-07-26 DIAGNOSIS — I10 Essential (primary) hypertension: Secondary | ICD-10-CM

## 2024-07-26 DIAGNOSIS — E119 Type 2 diabetes mellitus without complications: Secondary | ICD-10-CM

## 2024-07-26 DIAGNOSIS — F0280 Dementia in other diseases classified elsewhere without behavioral disturbance: Secondary | ICD-10-CM

## 2024-07-27 ENCOUNTER — Encounter: Payer: Self-pay | Admitting: Internal Medicine

## 2024-07-27 ENCOUNTER — Ambulatory Visit
Admission: RE | Admit: 2024-07-27 | Discharge: 2024-07-27 | Disposition: A | Discharge status: Home / Self Care | Source: Ambulatory Visit | Attending: Internal Medicine | Admitting: Internal Medicine

## 2024-07-27 DIAGNOSIS — R937 Abnormal findings on diagnostic imaging of other parts of musculoskeletal system: Secondary | ICD-10-CM

## 2024-07-27 DIAGNOSIS — R930 Abnormal findings on diagnostic imaging of skull and head, not elsewhere classified: Secondary | ICD-10-CM

## 2024-07-27 LAB — PROTEIN ELECTROPHORESIS, SERUM, WITH REFLEX
Albumin ELP: 3.6 g/dL — ABNORMAL LOW (ref 3.8–4.8)
Alpha 1: 0.3 g/dL (ref 0.2–0.3)
Alpha 2: 0.8 g/dL (ref 0.5–0.9)
Beta 2: 0.5 g/dL (ref 0.2–0.5)
Beta Globulin: 0.4 g/dL (ref 0.4–0.6)
Gamma Globulin: 1.2 g/dL (ref 0.8–1.7)
Total Protein: 6.8 g/dL (ref 6.1–8.1)

## 2024-07-27 LAB — IGG, IGA, IGM
IgG (Immunoglobin G), Serum: 1322 mg/dL (ref 600–1540)
IgM, Serum: 129 mg/dL (ref 50–300)
Immunoglobulin A: 263 mg/dL (ref 70–320)

## 2024-07-27 LAB — PTH, INTACT AND CALCIUM
Calcium: 10.4 mg/dL (ref 8.6–10.4)
PTH: 53 pg/mL (ref 16–77)

## 2024-07-27 MED ORDER — SITAGLIPTIN PHOSPHATE 100 MG PO TABS: 100.0000 mg | ORAL_TABLET | Freq: Every day | ORAL | 3 refills | Status: DC

## 2024-07-27 MED ORDER — GADOPICLENOL 0.5 MMOL/ML IV SOLN
7.0000 mL | Freq: Once | INTRAVENOUS | Status: AC | PRN
  Administered 2024-07-27: 7 mL via INTRAVENOUS

## 2024-07-27 NOTE — Telephone Encounter (Signed)
 Ok sure we can change metformiin to Januvia , which might be more expensive , but is very easy to take

## 2024-08-03 NOTE — Telephone Encounter (Signed)
 Copied from CRM #[id number]. Topic: Referral - Question >> Aug 03, 2024  2:32 PM Precious C wrote: Reason for CRM: Patient's granddaughter called regarding her grandmother's referral to [facility]. She stated that when she contacted [facility], they reported they have not received the referral. She also mentioned that she has reached out multiple times, including through messages, but has not received any response. Patient's granddaughter expressed frustration, as she has been trying to get an update without success.  She is requesting a follow-up call back as soon as possible. Callback number: [phone].

## 2024-08-04 NOTE — Telephone Encounter (Signed)
 Wanted to put in a note that I have community messaged Cythia Sillmon at [facility] as the last communication placed by the referrals team was that it was sent to [place] at [facility] [facility]

## 2024-08-15 NOTE — Telephone Encounter (Signed)
 Unfortunately we dont have a specific diagnosis, though recent CT scan are suspicious for malignancy  I have ordered MRI brain and c- spine already  But we can refer to Oncology now if she wants this.  I just can't say she more than likely has less than 6 mo to live based on what we currently know

## 2024-08-17 ENCOUNTER — Telehealth: Payer: Self-pay

## 2024-08-17 ENCOUNTER — Encounter: Payer: Self-pay | Admitting: Neurology

## 2024-08-17 ENCOUNTER — Other Ambulatory Visit: Payer: Self-pay | Admitting: Nurse Practitioner

## 2024-08-17 DIAGNOSIS — F0280 Dementia in other diseases classified elsewhere without behavioral disturbance: Secondary | ICD-10-CM

## 2024-08-17 NOTE — Telephone Encounter (Signed)
 Copied from CRM [id number]. Topic: General - Other >> Aug 17, 2024  3:32 PM [place] E wrote: Reason for CRM: RICK GLENWOOD Hollering with [facility] called in stating that they received the request for palliative care for the patient, and they will follow this for the patient as well. Best callback number for Hollering is [phone].

## 2024-08-18 ENCOUNTER — Inpatient Hospital Stay: Admitting: Nurse Practitioner

## 2024-08-25 ENCOUNTER — Ambulatory Visit: Admitting: Nurse Practitioner

## 2024-08-25 ENCOUNTER — Ambulatory Visit: Payer: Self-pay | Admitting: Nurse Practitioner

## 2024-08-25 ENCOUNTER — Ambulatory Visit

## 2024-08-25 ENCOUNTER — Encounter: Payer: Self-pay | Admitting: Nurse Practitioner

## 2024-08-25 VITALS — BP 122/74 | HR 67 | Temp 97.6°F | Ht 60.0 in | Wt 160.1 lb

## 2024-08-25 DIAGNOSIS — G309 Alzheimer's disease, unspecified: Secondary | ICD-10-CM

## 2024-08-25 DIAGNOSIS — M25521 Pain in right elbow: Secondary | ICD-10-CM

## 2024-08-25 DIAGNOSIS — F0280 Dementia in other diseases classified elsewhere without behavioral disturbance: Secondary | ICD-10-CM

## 2024-08-25 DIAGNOSIS — E1165 Type 2 diabetes mellitus with hyperglycemia: Secondary | ICD-10-CM | POA: Diagnosis not present

## 2024-08-25 NOTE — Assessment & Plan Note (Signed)
 Right elbow pain Acute pain with tenderness and redness, no trauma or gout history. Possible fracture or joint pathology. - Order x-ray of right elbow. - Advise rest and ice application 20 minutes on/off

## 2024-08-25 NOTE — Assessment & Plan Note (Addendum)
 Type 2 diabetes mellitus with poor glycemic control A1c at 8.5%. Concerns about medication side effects and cost. Jardiance preferred for renal protection. Glipizide and insulin  considered. Emphasized dietary management and glucose monitoring. - Monitor blood glucose at home. - Discuss medication options with endocrinologist. - Consider dietary modifications. Urinary incontinence Fully incontinent, uses undergarments. Concerns about UTI risk with diabetes medications. - Monitor for UTI signs. - Discuss incontinence management with palliative care. - Follow up with endocrinologist in 6 weeks.

## 2024-08-25 NOTE — Patient Instructions (Addendum)
 Glipizide? Ozempic? Mounjaro? Farxiga vs. Jardiance (one may be less expensive)  Ice for elbow - apply for 20 minutes then remove for 20 minutes before applying again

## 2024-08-25 NOTE — Progress Notes (Signed)
 Established Patient Office Visit  Subjective   Patient ID: Brandi Walsh, female    DOB: [birth year]  Age: 78 y.o. MRN: [id number]  No chief complaint on file.   Discussed the use of AI scribe software for clinical note transcription with the patient, who gave verbal consent to proceed.  History of Present Illness Brandi Walsh is a 78 year old female with Alzheimer's dementia and diabetes who presents with concerns about diabetes management and mobility issues.  Glycemic control and diabetes management - Hemoglobin A1c of 8.5 indicating suboptimal glycemic control -Follows with endocrinology -Has variable appetite and energy levels. Does not always consisently eat 3 meals/day - Preference for Pepsi over water, complicating dietary management - Concerns about medication side effects and costs have prevented initiation of Jardiance. Has history of falls and incontinence.   Mobility impairment and parkinsonism - Shuffling gait and rigidity consistent with Parkinsonism - Unable to ambulate independently - Fall occurred in early August, raising concerns for future falls - Receives physical and occupational therapy once weekly to improve stability and balance  Right elbow pain and inflammation - Soreness, tenderness, and mild erythema of the right elbow - No recent falls or injuries - No history of gout - Typically does not experience pain unless it is significant      ROS: see above    Objective:     BP 122/74   Pulse 67   Temp 97.6 F (36.4 C) (Temporal)   Ht 5' (1.524 m)   Wt 160 lb 2 oz (72.6 kg)   SpO2 98%   BMI 31.27 kg/m    Physical Exam Vitals reviewed.  Constitutional:      General: She is not in acute distress.    Appearance: Normal appearance.  HENT:     Head: Normocephalic and atraumatic.  Neck:     Vascular: No carotid bruit.  Cardiovascular:     Rate and Rhythm: Normal rate and regular rhythm.     Pulses: Normal pulses.     Heart sounds:  Normal heart sounds.  Pulmonary:     Effort: Pulmonary effort is normal.     Breath sounds: Normal breath sounds.  Skin:    General: Skin is warm and dry.  Neurological:     Mental Status: She is alert. Mental status is at baseline.  Psychiatric:        Mood and Affect: Mood normal.        Behavior: Behavior normal.        Judgment: Judgment normal.      No results found for any visits on 08/25/24.    The ASCVD Risk score (Arnett DK, et al., 2019) failed to calculate for the following reasons:   The valid total cholesterol range is 130 to 320 mg/dL    Assessment & Plan:   Problem List Items Addressed This Visit       Endocrine   Type II diabetes mellitus   Type 2 diabetes mellitus with poor glycemic control A1c at 8.5%. Concerns about medication side effects and cost. Jardiance preferred for renal protection. Glipizide and insulin  considered. Emphasized dietary management and glucose monitoring. - Monitor blood glucose at home. - Discuss medication options with endocrinologist. - Consider dietary modifications. Urinary incontinence Fully incontinent, uses undergarments. Concerns about UTI risk with diabetes medications. - Monitor for UTI signs. - Discuss incontinence management with palliative care. - Follow up with endocrinologist in 6 weeks.      Relevant Medications   JARDIANCE  10 MG TABS tablet     Nervous and Auditory   Dementia likely due to Alzheimer's disease   Alzheimer's disease with decreased appetite and increased somnolence Progression with decreased appetite and increased sleep. Appetite changes common in Alzheimer's. - Encourage intake of finger foods and snacks. - Monitor appetite and somnolence changes. 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 Recent fall. PT and OT in  place. - Continue PT and OT sessions. - Remove tripping hazards. - Ensure grab bars installed. Goals of Care Discussion on balancing quality of life with medical management. Consider comfort-focused care and reducing medication burden. - Discuss goals of care with palliative care team. - Consider reducing medication burden if appropriate.        Other   Right elbow pain - Primary   Right elbow pain Acute pain with tenderness and redness, no trauma or gout history. Possible fracture or joint pathology. - Order x-ray of right elbow. - Advise rest and ice application 20 minutes on/off      Relevant Orders   DG Elbow 2 Views Right (Completed)   Assessment and Plan Assessment & Plan Right elbow pain Acute pain with tenderness and redness, no trauma or gout history. Possible fracture or joint pathology. - Order x-ray of right elbow. - Advise rest and ice application 20 minutes on/off.   Type 2 diabetes mellitus with poor glycemic control A1c at 8.5%. Concerns about medication side effects and cost. Jardiance preferred for renal protection. Glipizide and insulin  considered. Emphasized dietary management and glucose monitoring. - Monitor blood glucose at home. - Discuss medication options with endocrinologist. - Consider dietary modifications. - Follow up with endocrinologist in 6 weeks.  Alzheimer's disease with decreased appetite and increased somnolence Progression with decreased appetite and increased sleep. Appetite changes common in Alzheimer's. - Encourage intake of finger foods and snacks. - Monitor appetite and somnolence changes.  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 Recent fall. PT and OT in place. - Continue PT and OT sessions. - Remove tripping hazards. - Ensure grab bars installed.  Urinary incontinence Fully incontinent, uses undergarments. Concerns about UTI risk with diabetes  medications. - Monitor for UTI signs. - Discuss incontinence management with palliative care.  Goals of Care Discussion on balancing quality of life with medical management. Consider comfort-focused care and reducing medication burden. - Discuss goals of care with palliative care team. - Consider reducing medication burden if appropriate.   Return in about 3 months (around 11/24/2024).    Lauraine FORBES Pereyra, NP

## 2024-08-25 NOTE — Assessment & Plan Note (Addendum)
 Alzheimer's disease with decreased appetite and increased somnolence Progression with decreased appetite and increased sleep. Appetite changes common in Alzheimer's. - Encourage intake of finger foods and snacks. - Monitor appetite and somnolence changes. 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 Recent fall. PT and OT in place. - Continue PT and OT sessions. - Remove tripping hazards. - Ensure grab bars installed. Goals of Care Discussion on balancing quality of life with medical management. Consider comfort-focused care and reducing medication burden. - Discuss goals of care with palliative care team. - Consider reducing medication burden if appropriate.

## 2024-09-06 NOTE — Telephone Encounter (Signed)
 Called number provided unable to speak to anyone and left voice mail for the to call back

## 2024-09-07 ENCOUNTER — Telehealth: Payer: Self-pay

## 2024-09-07 DIAGNOSIS — F0280 Dementia in other diseases classified elsewhere without behavioral disturbance: Secondary | ICD-10-CM

## 2024-09-07 NOTE — Telephone Encounter (Signed)
 Copied from CRM #[id number]. Topic: Clinical - Request for Lab/Test Order >> Sep 07, 2024  9:37 AM Mia F wrote: Reason for CRM: Pj from [facility] need a hospice order. Need as soon as possible for pt to be moved to hospice. Please call Pj back at [phone].    Pj will fax over a request with contact info for order to be sent to

## 2024-09-07 NOTE — Addendum Note (Signed)
 Addended by: ELNOR LAURAINE BRAVO on: 09/07/2024 02:33 PM   Modules accepted: Orders

## 2024-09-07 NOTE — Telephone Encounter (Signed)
 Referral to hospice ordered here. If I need to sign another order please bring to my desk for my review.

## 2024-09-29 ENCOUNTER — Telehealth: Payer: Self-pay

## 2024-09-29 NOTE — Telephone Encounter (Signed)
 Copied from CRM #[id number]. Topic: General - Other >> Sep 29, 2024 10:57 AM Rea ORN wrote: Reason for CRM: Newell with [facility] called to see if clinic received a DC order for pt's home health. Newell will refax. Please call back [phone]

## 2024-09-29 NOTE — Telephone Encounter (Signed)
 Forms received and will be given to provider on Thursday

## 2024-10-13 ENCOUNTER — Ambulatory Visit: Admitting: Nurse Practitioner

## 2024-11-08 ENCOUNTER — Ambulatory Visit: Admitting: Neurology

## 2024-11-14 ENCOUNTER — Other Ambulatory Visit: Payer: Self-pay | Admitting: Nurse Practitioner

## 2024-12-07 ENCOUNTER — Ambulatory Visit: Admitting: Nurse Practitioner

## 2024-12-07 VITALS — BP 112/74 | HR 74 | Temp 97.7°F | Ht 60.0 in | Wt 162.0 lb

## 2024-12-07 DIAGNOSIS — I10 Essential (primary) hypertension: Secondary | ICD-10-CM

## 2024-12-07 DIAGNOSIS — E1165 Type 2 diabetes mellitus with hyperglycemia: Secondary | ICD-10-CM | POA: Diagnosis not present

## 2024-12-07 DIAGNOSIS — Z23 Encounter for immunization: Secondary | ICD-10-CM | POA: Diagnosis not present

## 2024-12-07 NOTE — Assessment & Plan Note (Signed)
 Flu vaccine - Due for flu vaccine today. VIS provided and flu shot administered.

## 2024-12-07 NOTE — Assessment & Plan Note (Signed)
 Hypertension Blood pressure slightly lower than usual but within acceptable range. No immediate changes to antihypertensive medication necessary. - Continue to monitor blood pressure regularly. - Will consider adjusting losartan  if blood pressure remains low. Currently on losartan  50mg /day

## 2024-12-07 NOTE — Assessment & Plan Note (Signed)
 Type 2 diabetes mellitus with hyperglycemia A1c at 8.6, slightly elevated. Emphasized balancing glycemic control with quality of life, avoiding aggressive treatment due to risk of acute hypoglycemia. Target A1c is around 8 in my opinion, but will defer recommendations to endocrinology.  - Encouraged physical activity, such as walking, to help manage blood sugar levels. - Continue monitoring A1c levels. - Avoid aggressive medication adjustments to prevent complications of acute hypoglycemia. Continue to follow with endocrinology - Continue statin and ARB

## 2024-12-07 NOTE — Progress Notes (Signed)
 "  Established Patient Office Visit  Subjective   Patient ID: Brandi Walsh, female    DOB: Feb 03, 1946  Age: 79 y.o. MRN: [id number]  Chief Complaint  Patient presents with   Diabetes    Discussed the use of AI scribe software for clinical note transcription with the patient, who gave verbal consent to proceed.  History of Present Illness Brandi Walsh is a 79 year old female with diabetes who presents for management of her A1c levels. She is accompanied by her granddaughter who is her caregiver.  T2DM - Diabetes mellitus with recent A1c of 8.6 - Follows with endocrinology - Fluctuating appetite - Diet controlled, on rosuvastatin  and ARB  Infection risk and wound healing - Caregiver concerned about infection risk related to diabetes - No current non-healing wounds - No known arterial disease - Wears compression stockings - Caregiver focused on maintaining mobility - Concern for amputation risk if future sores do not heal  Mobility and falls - Physical and occupational therapy discontinued - No recent falls  Hospice care - Currently on hospice - All specialists except endocrinology have been discontinued      ROS: see hpi    Objective:     BP 112/74   Pulse 74   Temp 97.7 F (36.5 C) (Temporal)   Ht 5' (1.524 m)   Wt 162 lb (73.5 kg)   SpO2 98%   BMI 31.64 kg/m  BP Readings from Last 3 Encounters:  12/07/24 112/74  08/25/24 122/74  07/25/24 122/74   Wt Readings from Last 3 Encounters:  12/07/24 162 lb (73.5 kg)  08/25/24 160 lb 2 oz (72.6 kg)  07/04/24 155 lb (70.3 kg)      Physical Exam Vitals reviewed.  Constitutional:      General: She is not in acute distress.    Appearance: Normal appearance.  HENT:     Head: Normocephalic and atraumatic.  Cardiovascular:     Rate and Rhythm: Normal rate and regular rhythm.     Pulses: Normal pulses.     Heart sounds: Normal heart sounds.  Pulmonary:     Effort: Pulmonary effort is normal.      Breath sounds: Normal breath sounds.  Skin:    General: Skin is warm and dry.  Neurological:     General: No focal deficit present.     Mental Status: She is alert and oriented to person, place, and time.  Psychiatric:        Mood and Affect: Mood normal.        Behavior: Behavior normal.        Judgment: Judgment normal.      No results found for any visits on 12/07/24.    The ASCVD Risk score (Arnett DK, et al., 2019) failed to calculate for the following reasons:   The valid total cholesterol range is 130 to 320 mg/dL    Assessment & Plan:   Problem List Items Addressed This Visit       Cardiovascular and Mediastinum   Essential hypertension   Hypertension Blood pressure slightly lower than usual but within acceptable range. No immediate changes to antihypertensive medication necessary. - Continue to monitor blood pressure regularly. - Will consider adjusting losartan  if blood pressure remains low. Currently on losartan  50mg /day        Endocrine   Type II diabetes mellitus - Primary   Type 2 diabetes mellitus with hyperglycemia A1c at 8.6, slightly elevated. Emphasized balancing glycemic control with quality of life,  avoiding aggressive treatment due to risk of acute hypoglycemia. Target A1c is around 8 in my opinion, but will defer recommendations to endocrinology.  - Encouraged physical activity, such as walking, to help manage blood sugar levels. - Continue monitoring A1c levels. - Avoid aggressive medication adjustments to prevent complications of acute hypoglycemia. Continue to follow with endocrinology - Continue statin and ARB        Other   Need for vaccination   Flu vaccine - Due for flu vaccine today. VIS provided and flu shot administered.       Relevant Orders   Flu vaccine HIGH DOSE PF(Fluzone Trivalent) (Completed)  Assessment and Plan Assessment & Plan Type 2 diabetes mellitus with hyperglycemia A1c at 8.6, slightly elevated. Emphasized  balancing glycemic control with quality of life, avoiding aggressive treatment due to risk of acute hypoglycemia. Target A1c is around 8 in my opinion, but will defer recommendations to endocrinology.  - Encouraged physical activity, such as walking, to help manage blood sugar levels. - Continue monitoring A1c levels. - Avoid aggressive medication adjustments to prevent complications of acute hypoglycemia. Continue to follow with endocrinology - Continue statin and ARB  Hypertension Blood pressure slightly lower than usual but within acceptable range. No immediate changes to antihypertensive medication necessary. - Continue to monitor blood pressure regularly. - Will consider adjusting losartan  if blood pressure remains low. Currently on losartan  50mg /day  Flu vaccine - Due for flu vaccine today. VIS provided and flu shot administered.     Return in about 6 months (around 06/06/2025) for F/U with Minsa Weddington.    Brandi FORBES Pereyra, NP  "

## 2025-01-02 ENCOUNTER — Other Ambulatory Visit: Payer: Self-pay | Admitting: Nurse Practitioner
